# Patient Record
Sex: Male | Born: 1940 | Race: White | Hispanic: No | Marital: Married | Smoking: Never smoker
Health system: Southern US, Community
[De-identification: ages and names within clinical notes are randomized; demographics above are authoritative.]

## PROBLEM LIST (undated history)

## (undated) DIAGNOSIS — G43109 Migraine with aura, not intractable, without status migrainosus: Secondary | ICD-10-CM

## (undated) DIAGNOSIS — M25569 Pain in unspecified knee: Secondary | ICD-10-CM

## (undated) DIAGNOSIS — I2510 Atherosclerotic heart disease of native coronary artery without angina pectoris: Secondary | ICD-10-CM

## (undated) DIAGNOSIS — I10 Essential (primary) hypertension: Secondary | ICD-10-CM

## (undated) DIAGNOSIS — I779 Disorder of arteries and arterioles, unspecified: Secondary | ICD-10-CM

## (undated) DIAGNOSIS — N529 Male erectile dysfunction, unspecified: Secondary | ICD-10-CM

## (undated) DIAGNOSIS — Z8719 Personal history of other diseases of the digestive system: Secondary | ICD-10-CM

## (undated) DIAGNOSIS — R0989 Other specified symptoms and signs involving the circulatory and respiratory systems: Secondary | ICD-10-CM

## (undated) DIAGNOSIS — K219 Gastro-esophageal reflux disease without esophagitis: Secondary | ICD-10-CM

## (undated) DIAGNOSIS — R7301 Impaired fasting glucose: Secondary | ICD-10-CM

## (undated) DIAGNOSIS — M25519 Pain in unspecified shoulder: Secondary | ICD-10-CM

## (undated) DIAGNOSIS — E785 Hyperlipidemia, unspecified: Secondary | ICD-10-CM

## (undated) DIAGNOSIS — H8109 Meniere's disease, unspecified ear: Secondary | ICD-10-CM

## (undated) DIAGNOSIS — Z87442 Personal history of urinary calculi: Secondary | ICD-10-CM

## (undated) DIAGNOSIS — Z951 Presence of aortocoronary bypass graft: Secondary | ICD-10-CM

## (undated) DIAGNOSIS — Z974 Presence of external hearing-aid: Secondary | ICD-10-CM

## (undated) DIAGNOSIS — R9439 Abnormal result of other cardiovascular function study: Secondary | ICD-10-CM

## (undated) DIAGNOSIS — R590 Localized enlarged lymph nodes: Secondary | ICD-10-CM

## (undated) DIAGNOSIS — R0600 Dyspnea, unspecified: Secondary | ICD-10-CM

## (undated) DIAGNOSIS — Z87898 Personal history of other specified conditions: Secondary | ICD-10-CM

## (undated) DIAGNOSIS — G43909 Migraine, unspecified, not intractable, without status migrainosus: Secondary | ICD-10-CM

## (undated) DIAGNOSIS — I739 Peripheral vascular disease, unspecified: Secondary | ICD-10-CM

## (undated) DIAGNOSIS — IMO0002 Reserved for concepts with insufficient information to code with codable children: Secondary | ICD-10-CM

## (undated) DIAGNOSIS — I6529 Occlusion and stenosis of unspecified carotid artery: Secondary | ICD-10-CM

## (undated) HISTORY — DX: Reserved for concepts with insufficient information to code with codable children: IMO0002

## (undated) HISTORY — PX: TONSILLECTOMY: SUR1361

## (undated) HISTORY — DX: Migraine with aura, not intractable, without status migrainosus: G43.109

## (undated) HISTORY — DX: Atherosclerotic heart disease of native coronary artery without angina pectoris: I25.10

## (undated) HISTORY — DX: Migraine, unspecified, not intractable, without status migrainosus: G43.909

## (undated) HISTORY — DX: Pain in unspecified shoulder: M25.519

## (undated) HISTORY — PX: APPENDECTOMY: SHX54

## (undated) HISTORY — DX: Peripheral vascular disease, unspecified: I73.9

## (undated) HISTORY — DX: Male erectile dysfunction, unspecified: N52.9

## (undated) HISTORY — DX: Presence of aortocoronary bypass graft: Z95.1

## (undated) HISTORY — DX: Pain in unspecified knee: M25.569

## (undated) HISTORY — DX: Hyperlipidemia, unspecified: E78.5

## (undated) HISTORY — DX: Disorder of arteries and arterioles, unspecified: I77.9

## (undated) HISTORY — PX: HERNIA REPAIR: SHX51

## (undated) HISTORY — DX: Meniere's disease, unspecified ear: H81.09

## (undated) HISTORY — PX: KNEE CARTILAGE SURGERY: SHX688

## (undated) HISTORY — DX: Impaired fasting glucose: R73.01

## (undated) HISTORY — DX: Essential (primary) hypertension: I10

## (undated) HISTORY — PX: HIATAL HERNIA REPAIR: SHX195

## (undated) HISTORY — DX: Presence of external hearing-aid: Z97.4

## (undated) HISTORY — DX: Gastro-esophageal reflux disease without esophagitis: K21.9

---
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
## 1996-08-20 HISTORY — PX: CARPAL TUNNEL RELEASE: SHX101

## 1998-08-29 ENCOUNTER — Ambulatory Visit (HOSPITAL_COMMUNITY): Admission: RE | Admit: 1998-08-29 | Discharge: 1998-08-29 | Payer: Self-pay

## 1998-10-10 ENCOUNTER — Ambulatory Visit: Admission: RE | Admit: 1998-10-10 | Discharge: 1998-10-10 | Payer: Self-pay | Admitting: Internal Medicine

## 1999-11-03 ENCOUNTER — Emergency Department (HOSPITAL_COMMUNITY): Admission: EM | Admit: 1999-11-03 | Discharge: 1999-11-03 | Payer: Self-pay | Admitting: Emergency Medicine

## 1999-11-04 ENCOUNTER — Encounter: Payer: Self-pay | Admitting: Emergency Medicine

## 2002-01-05 ENCOUNTER — Ambulatory Visit (HOSPITAL_COMMUNITY): Admission: RE | Admit: 2002-01-05 | Discharge: 2002-01-05 | Payer: Self-pay | Admitting: Internal Medicine

## 2004-06-28 ENCOUNTER — Ambulatory Visit: Payer: Self-pay | Admitting: Internal Medicine

## 2004-07-07 ENCOUNTER — Encounter: Admission: RE | Admit: 2004-07-07 | Discharge: 2004-07-07 | Payer: Self-pay | Admitting: Internal Medicine

## 2004-07-25 ENCOUNTER — Ambulatory Visit (HOSPITAL_COMMUNITY): Admission: RE | Admit: 2004-07-25 | Discharge: 2004-07-25 | Payer: Self-pay | Admitting: Internal Medicine

## 2004-08-01 ENCOUNTER — Ambulatory Visit: Payer: Self-pay | Admitting: Internal Medicine

## 2005-03-01 ENCOUNTER — Ambulatory Visit: Payer: Self-pay | Admitting: Internal Medicine

## 2005-03-16 ENCOUNTER — Ambulatory Visit: Payer: Self-pay | Admitting: Internal Medicine

## 2006-08-20 HISTORY — PX: ROTATOR CUFF REPAIR: SHX139

## 2006-08-20 HISTORY — PX: OTHER SURGICAL HISTORY: SHX169

## 2006-08-20 HISTORY — PX: CARPAL TUNNEL RELEASE: SHX101

## 2007-03-13 ENCOUNTER — Ambulatory Visit (HOSPITAL_BASED_OUTPATIENT_CLINIC_OR_DEPARTMENT_OTHER): Admission: RE | Admit: 2007-03-13 | Discharge: 2007-03-13 | Payer: Self-pay | Admitting: Orthopedic Surgery

## 2008-08-20 HISTORY — PX: MEDIAL PARTIAL KNEE REPLACEMENT: SHX5965

## 2009-05-02 ENCOUNTER — Ambulatory Visit: Payer: Self-pay | Admitting: Vascular Surgery

## 2009-05-02 ENCOUNTER — Encounter (INDEPENDENT_AMBULATORY_CARE_PROVIDER_SITE_OTHER): Payer: Self-pay | Admitting: Orthopedic Surgery

## 2009-05-02 ENCOUNTER — Ambulatory Visit: Admission: RE | Admit: 2009-05-02 | Discharge: 2009-05-02 | Payer: Self-pay | Admitting: Orthopedic Surgery

## 2009-07-21 ENCOUNTER — Encounter (INDEPENDENT_AMBULATORY_CARE_PROVIDER_SITE_OTHER): Payer: Self-pay | Admitting: *Deleted

## 2009-07-25 ENCOUNTER — Ambulatory Visit: Payer: Self-pay | Admitting: Internal Medicine

## 2009-07-27 ENCOUNTER — Telehealth: Payer: Self-pay | Admitting: Internal Medicine

## 2009-07-28 ENCOUNTER — Ambulatory Visit: Payer: Self-pay | Admitting: Internal Medicine

## 2009-08-02 ENCOUNTER — Encounter: Payer: Self-pay | Admitting: Internal Medicine

## 2011-01-02 NOTE — Op Note (Signed)
NAME:  Jordan [name], Jordan [name]              ACCOUNT NO.:  [id number]   MEDICAL RECORD NO.:  [id number]          PATIENT TYPE:  AMB   LOCATION:  DSC                          FACILITY:  [facility]   PHYSICIAN:  Katy Fitch. Sypher, M.D. 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   OPERATIONS:  Release of right transverse carpal ligament.   OPERATIONS:  Katy Fitch. Sypher, M.D.   ASSISTANT:  Marveen Reeks. Dasnoit PA-C.   ANESTHESIA:  General by LMA.   SUPERVISING ANESTHESIOLOGIST:  Bedelia Person, M.D.   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   He is status post release of his left transverse carpal ligament  approximately 10 years prior, with a very satisfactory result.  He has  had intermittent numbness in the right hand and returned for evaluation  and management on February 24, 2007.  At that time, he had signs of clinical  carpal tunnel syndrome on the right and had electrodiagnostic studies  completed by Dr. Johna Roles, which revealed significant right carpal  tunnel syndrome.   Due to plans to travel for several weeks, he was treated with a  palliative steroid injection into his right ulnar bursa, planning to  return at this time for release of the right transverse carpal ligament.   Following the injection, he had partial relief of his numbness and  complete relief of his pain.   He now presents for release of his right transverse carpal ligament.   Preoperatively. questions were invited and answered in detail.   PROCEDURE:  Onalee Hua Reichardt is brought to the operating room and placed  in the supine position on the table.   Following an anesthesia consult by Dr. Gypsy Balsam, general  anesthesia by LMA  was selected.   Mr. Gruszka was brought to room 1, placed in the supine position on the  operating table, and under Dr. Burnett Corrente direct supervision, general  anesthesia by LMA technique instituted.   The right arm was prepped with Betadine soap and solution and sterilely  draped.  A pneumatic tourniquet was applied to the proximal right  brachium.   Following exsanguination of the right arm with an Esmarch bandage, the  arterial tourniquet was inflated to 230 mmHg.  The procedure commenced  with a short incision in line of the ring finger in the palm.  Subcutaneous tissues were carefully divided, taking care to identify the  palmar fascia.   The fascia was split in line of its fibers, revealing the superficial  palmar arch and the common sensory branches of the median nerve.   A Penfield 4 elevator was used to gently dissect along the common  digital nerves until the median nerve proper was identified.  The median  nerve was cleared from the deep surface of the transverse carpal  ligament with a Insurance risk surveyor,  followed by use of serrated  scissors to release the ulnar aspect of the transverse carpal ligament  into the distal forearm.   This widely opened the carpal canal.   A Sewell parotid retractor was used to visualize the median nerve well  into the distal forearm.  The ulnar bursa was noted be thickened and  fibrotic, but otherwise no anatomic abnormalities were noted.   Bleeding points along margin of the released ligament were  electrocauterized with bipolar current, followed by repair of the skin  with intradermal 3-0 Prolene.   A compressive dressing was applied, with a volar plaster splint  maintaining the wrist in 5 degrees of dorsiflexion.   There were no apparent complications.   Mr. Culbreath tolerated the surgery and anesthesia well.   He is awakened from his anesthesia and transferred to the recovery room  with stable signs.   He  will be discharged to the care of his family, with a prescription for  Vicodin 5 mg 1 p.o. q.4-6 h. p.r.n. pain.   I will meet him in 9 days and remove his suture.      Katy Fitch Sypher, M.D.  Electronically Signed     RVS/MEDQ  D:  03/13/2007  T:  03/14/2007  Job:  [id number]   cc:   Loraine Leriche A. Perini, M.D.

## 2011-06-04 LAB — POCT HEMOGLOBIN-HEMACUE: Hemoglobin: 16

## 2011-07-03 ENCOUNTER — Other Ambulatory Visit: Payer: Self-pay

## 2011-07-03 DIAGNOSIS — I6529 Occlusion and stenosis of unspecified carotid artery: Secondary | ICD-10-CM

## 2011-07-05 ENCOUNTER — Encounter: Payer: Self-pay | Admitting: Vascular Surgery

## 2011-07-26 ENCOUNTER — Encounter: Payer: Self-pay | Admitting: Vascular Surgery

## 2011-07-27 ENCOUNTER — Ambulatory Visit (INDEPENDENT_AMBULATORY_CARE_PROVIDER_SITE_OTHER): Payer: Medicare Other | Admitting: Vascular Surgery

## 2011-07-27 ENCOUNTER — Encounter: Payer: Self-pay | Admitting: Vascular Surgery

## 2011-07-27 VITALS — BP 146/80 | HR 58 | Resp 16 | Ht 67.0 in | Wt 179.0 lb

## 2011-07-27 DIAGNOSIS — I6529 Occlusion and stenosis of unspecified carotid artery: Secondary | ICD-10-CM

## 2011-07-27 HISTORY — DX: Occlusion and stenosis of unspecified carotid artery: I65.29

## 2011-07-27 NOTE — Assessment & Plan Note (Signed)
Minimal disease, continued surveillance not needed at this point

## 2011-07-27 NOTE — Progress Notes (Signed)
[facility]  New Carotid Patient  Referred by:  Ezequiel Kayser, MD [id number] [facility] [place], [place] [address]  Reason for referral: possible carotid stenosis  History of Present Illness  Jordan [name] is a 70 y.o. male who presents with chief complaint: possible carotid problem.  Patient was involved with a fall and had a cervical x-ray completed which demonstrated: calcified carotids.  Patient has no history of TIA or stroke symptom.  The patient has never had amaurosis fugax or monocular blindness.  The patient has never had facial drooping or hemiplegia.  The patient has never had receptive or expressive aphasia.   The patient's risks factors for carotid disease include: hyperlipidemia.  Past Medical History  Diagnosis Date  . DDD (degenerative disc disease)   . Carotid artery occlusion   . ED (erectile dysfunction)   . Hyperlipidemia   . Knee pain     bilateral  . GERD (gastroesophageal reflux disease)   . Impaired fasting glucose   . Shoulder pain   . Wears hearing aid   . Ocular migraine   . Meniere's vertigo     Past Surgical History  Procedure Date  . Carpal tunnel release 1998  . Inguinal hernia repair   . Knee cartilage surgery   . Shoulder surgery 2008    left  . Wrist surgery 2008    right  . Joint replacement 2010    Partial right knee replacement    History   Social History  . Marital Status: Unknown    Spouse Name: N/A    Number of Children: N/A  . Years of Education: N/A   Occupational History  . Not on file.   Social History Main Topics  . Smoking status: Never Smoker   . Smokeless tobacco: Not on file  . Alcohol Use: 9.6 oz/week    14 Glasses of wine, 2 Shots of liquor per week  . Drug Use: No  . Sexually Active:    Other Topics Concern  . Not on file   Social History Narrative  . No narrative on file    Family History  Problem Relation Age of Onset  . Heart failure Mother    . Cancer Mother   . Heart disease Mother   . Coronary artery disease Father   . Heart attack Father   . Heart disease Father   . Cancer Father   . Hyperlipidemia Father   . Hyperlipidemia Brother   . Hypertension Brother   . Hyperlipidemia Son     Current Outpatient Prescriptions on File Prior to Visit  Medication Sig Dispense Refill  . cholecalciferol (VITAMIN D) 1000 UNITS tablet Take 4,000 Units by mouth daily.       Marland Kitchen esomeprazole (NEXIUM) 40 MG capsule Take 40 mg by mouth daily before breakfast.        . naproxen sodium (ANAPROX) 220 MG tablet Take 220 mg by mouth 2 (two) times daily with a meal.        . Omega-3 Fatty Acids (FISH OIL) 1000 MG CAPS Take 4 capsules by mouth daily.        . rosuvastatin (CRESTOR) 10 MG tablet Take 5 mg by mouth daily.       . tadalafil (CIALIS) 20 MG tablet Take 10 mg by mouth daily as needed.        . meclizine (ANTIVERT) 25 MG tablet Take 25 mg by mouth daily.        Marland Kitchen  pyridOXINE (VITAMIN B-6) 100 MG tablet Take 100 mg by mouth daily.          No Known Allergies  REVIEW OF SYSTEMS:  (Positives checked otherwise negative)  CARDIOVASCULAR: [ ]  chest pain    [ ]  chest pressure    [ ]  palpitations    [ ]  orthopnea   [ ]  dyspnea on exert. [ ]  claudication    [ ]  rest pain     [ ]  DVT     [ ]  phlebitis  PULMONARY:    [ ]  productive cough [ ]  asthma  [ ]  wheezing  NEUROLOGIC:    [ ]  weakness    [ ]  paresthesias   [ ]  aphasia    [ ]  amaurosis    [ ]  dizziness  HEMATOLOGIC:    [ ]  bleeding problems  [ ]  clotting disorders  MUSCULOSKEL: [x]  joint pain     [ ]  joint swelling  GASTROINTEST:  [ ]   blood in stool   [ ]   hematemesis  GENITOURINARY:   [ ]   dysuria    [ ]   hematuria  PSYCHIATRIC:   [ ]  history of major depression  INTEGUMENTARY: [ ]  rashes    [ ]  ulcers  CONSTITUTIONAL:  [ ]  fever     [ ]  chills  Physical Examination  Filed Vitals:   07/27/11 1250  BP: 146/80  Pulse: 58  Resp: 16  Height: 5\' 7"  (1.702 m)  Weight: 179 lb  (81.194 kg)  SpO2: 95%   Body mass index is 28.04 kg/(m^2).  General: A&O x 3, WDWN   Head: /AT  Ear/Nose/Throat: Hearing grossly intact, nares w/o erythema or drainage, oropharynx w/o Erythema/Exudate  Eyes: PERRLA, EOMI  Neck: Supple, no nuchal rigidity, no palpable LAD  Pulmonary: Sym exp, good air movt, CTAB, no rales, rhonchi, & wheezing  Cardiac: RRR, Nl S1, S2, no Murmurs, rubs or gallops  Vascular: Vessel Right Left  Radial Palpable Palpable  Brachial Palpable Palpable  Carotid Palpable, without bruit Palpable, without bruit  Aorta Non-palpable N/A  Femoral Palpable Palpable  Popliteal Non-palpable Non-palpable  PT Palpable Palpable  DP Palpable Palpable   Gastrointestinal: soft, NTND, -G/Jordan, - HSM, - masses, - CVAT B  Musculoskeletal: M/S 5/5 throughout , Extremities without ischemic changes   Neurologic: CN 2-12 intact , Pain and light touch intact in extremities , Motor exam as listed above  Psychiatric: Judgment intact, Mood & affect appropriate for pt's clinical situation  Dermatologic: See M/S exam for extremity exam, no rashes otherwise noted  Lymph : No Cervical, Axillary, or Inguinal lymphadenopathy   Non-Invasive Vascular Imaging  CAROTID DUPLEX (Date: 07/27/11):   Jordan ICA stenosis: 1-39%  Jordan VA:  patent and antegrade  L ICA stenosis: 1-39%  L VA:  patent and antegrade  Outside Studies/Documentation 5 pages of outside documents were reviewed including: outside clinic workup included cervical x-ray report.  Medical Decision Making  Jordan [name] is a 70 y.o. male who presents with: minimal B ICA stenosis.   Based on the patient's vascular studies and examination, I have offered the patient: as needed carotid duplex.  Given this patient's minimal disease, I don't think there is much advantage to continued surveillance.  If he develops a bruit or become sx, then at that time, I would rescan him.  Alternatively we could repeat the duplex in  another two years.  The patient elected to forgo continued surveillance at this point.  I discussed in depth  with the patient the nature of atherosclerosis, and emphasized the importance of maximal medical management including strict control of blood pressure, blood glucose, and lipid levels, obtaining regular exercise, antiplatelet agents, and cessation of smoking.  The patient is aware that without maximal medical management the underlying atherosclerotic disease process will progress, limiting the benefit of any interventions.  Thank you for allowing [place] to participate in this patient's care.  Leonides Sake, MD [facility] Office: [phone] Pager: [phone]  07/27/2011, 1:36 PM

## 2011-07-27 NOTE — Progress Notes (Signed)
Carotid duplex bilateral done @ [facility] 07/27/2011

## 2011-08-02 NOTE — Procedures (Unsigned)
CAROTID DUPLEX EXAM  INDICATION:  Carotid stenosis.  HISTORY: Diabetes:  No. Cardiac:  No. Hypertension:  No. Smoking:  No. Previous Surgery:  No. CV History:  Asymptomatic. Amaurosis Fugax No, Paresthesias No, Hemiparesis No.                                      RIGHT             LEFT Brachial systolic pressure:         132               126 Brachial Doppler waveforms:         WNL               WNL Vertebral direction of flow:        Antegrade         Antegrade DUPLEX VELOCITIES (cm/sec) CCA peak systolic                   113               117 ECA peak systolic                   92                77 ICA peak systolic                   91                71 ICA end diastolic                   29                21 PLAQUE MORPHOLOGY:                  Heterogenous      Heterogenous PLAQUE AMOUNT:                      Mild              Mild PLAQUE LOCATION:                    CCA/ICA           CCA/ICA  IMPRESSION: 1. Bilateral common carotid artery disease present. 2. Bilateral internal carotid artery stenosis estimated in the 1% to     39% range. 3. Bilateral external carotid arteries are patent. 4. Bilateral vertebral arteries are patent and antegrade.  ___________________________________________ Fransisco Hertz, MD  SH/MEDQ  D:  07/27/2011  T:  07/27/2011  Job:  [id number]

## 2011-10-18 DIAGNOSIS — H04129 Dry eye syndrome of unspecified lacrimal gland: Secondary | ICD-10-CM | POA: Diagnosis not present

## 2011-10-18 DIAGNOSIS — H2510 Age-related nuclear cataract, unspecified eye: Secondary | ICD-10-CM | POA: Diagnosis not present

## 2011-11-26 DIAGNOSIS — D239 Other benign neoplasm of skin, unspecified: Secondary | ICD-10-CM | POA: Diagnosis not present

## 2011-11-26 DIAGNOSIS — L570 Actinic keratosis: Secondary | ICD-10-CM | POA: Diagnosis not present

## 2011-11-26 DIAGNOSIS — L821 Other seborrheic keratosis: Secondary | ICD-10-CM | POA: Diagnosis not present

## 2012-02-07 DIAGNOSIS — M549 Dorsalgia, unspecified: Secondary | ICD-10-CM | POA: Diagnosis not present

## 2012-02-07 DIAGNOSIS — M999 Biomechanical lesion, unspecified: Secondary | ICD-10-CM | POA: Diagnosis not present

## 2012-02-07 DIAGNOSIS — M5380 Other specified dorsopathies, site unspecified: Secondary | ICD-10-CM | POA: Diagnosis not present

## 2012-02-26 DIAGNOSIS — M999 Biomechanical lesion, unspecified: Secondary | ICD-10-CM | POA: Diagnosis not present

## 2012-02-26 DIAGNOSIS — M5380 Other specified dorsopathies, site unspecified: Secondary | ICD-10-CM | POA: Diagnosis not present

## 2012-02-26 DIAGNOSIS — M549 Dorsalgia, unspecified: Secondary | ICD-10-CM | POA: Diagnosis not present

## 2012-03-10 DIAGNOSIS — M999 Biomechanical lesion, unspecified: Secondary | ICD-10-CM | POA: Diagnosis not present

## 2012-03-10 DIAGNOSIS — M5380 Other specified dorsopathies, site unspecified: Secondary | ICD-10-CM | POA: Diagnosis not present

## 2012-03-10 DIAGNOSIS — M549 Dorsalgia, unspecified: Secondary | ICD-10-CM | POA: Diagnosis not present

## 2012-03-19 DIAGNOSIS — M999 Biomechanical lesion, unspecified: Secondary | ICD-10-CM | POA: Diagnosis not present

## 2012-03-19 DIAGNOSIS — M549 Dorsalgia, unspecified: Secondary | ICD-10-CM | POA: Diagnosis not present

## 2012-03-19 DIAGNOSIS — M5380 Other specified dorsopathies, site unspecified: Secondary | ICD-10-CM | POA: Diagnosis not present

## 2012-03-27 DIAGNOSIS — M999 Biomechanical lesion, unspecified: Secondary | ICD-10-CM | POA: Diagnosis not present

## 2012-03-27 DIAGNOSIS — M5380 Other specified dorsopathies, site unspecified: Secondary | ICD-10-CM | POA: Diagnosis not present

## 2012-03-27 DIAGNOSIS — M549 Dorsalgia, unspecified: Secondary | ICD-10-CM | POA: Diagnosis not present

## 2012-04-09 DIAGNOSIS — M999 Biomechanical lesion, unspecified: Secondary | ICD-10-CM | POA: Diagnosis not present

## 2012-04-09 DIAGNOSIS — M549 Dorsalgia, unspecified: Secondary | ICD-10-CM | POA: Diagnosis not present

## 2012-04-09 DIAGNOSIS — M5380 Other specified dorsopathies, site unspecified: Secondary | ICD-10-CM | POA: Diagnosis not present

## 2012-04-15 DIAGNOSIS — M999 Biomechanical lesion, unspecified: Secondary | ICD-10-CM | POA: Diagnosis not present

## 2012-04-15 DIAGNOSIS — M5380 Other specified dorsopathies, site unspecified: Secondary | ICD-10-CM | POA: Diagnosis not present

## 2012-04-15 DIAGNOSIS — M549 Dorsalgia, unspecified: Secondary | ICD-10-CM | POA: Diagnosis not present

## 2012-05-06 DIAGNOSIS — Z23 Encounter for immunization: Secondary | ICD-10-CM | POA: Diagnosis not present

## 2012-06-05 DIAGNOSIS — L570 Actinic keratosis: Secondary | ICD-10-CM | POA: Diagnosis not present

## 2012-06-05 DIAGNOSIS — L821 Other seborrheic keratosis: Secondary | ICD-10-CM | POA: Diagnosis not present

## 2012-06-05 DIAGNOSIS — D239 Other benign neoplasm of skin, unspecified: Secondary | ICD-10-CM | POA: Diagnosis not present

## 2012-09-30 DIAGNOSIS — R7301 Impaired fasting glucose: Secondary | ICD-10-CM | POA: Diagnosis not present

## 2012-09-30 DIAGNOSIS — I2510 Atherosclerotic heart disease of native coronary artery without angina pectoris: Secondary | ICD-10-CM | POA: Diagnosis not present

## 2012-09-30 DIAGNOSIS — Z125 Encounter for screening for malignant neoplasm of prostate: Secondary | ICD-10-CM | POA: Diagnosis not present

## 2012-09-30 DIAGNOSIS — E785 Hyperlipidemia, unspecified: Secondary | ICD-10-CM | POA: Diagnosis not present

## 2012-10-07 DIAGNOSIS — E785 Hyperlipidemia, unspecified: Secondary | ICD-10-CM | POA: Diagnosis not present

## 2012-10-07 DIAGNOSIS — R7301 Impaired fasting glucose: Secondary | ICD-10-CM | POA: Diagnosis not present

## 2012-10-07 DIAGNOSIS — Z125 Encounter for screening for malignant neoplasm of prostate: Secondary | ICD-10-CM | POA: Diagnosis not present

## 2012-10-07 DIAGNOSIS — Z0000 Encounter for general adult medical examination without abnormal findings: Secondary | ICD-10-CM | POA: Diagnosis not present

## 2012-10-07 DIAGNOSIS — I6529 Occlusion and stenosis of unspecified carotid artery: Secondary | ICD-10-CM | POA: Diagnosis not present

## 2012-10-13 ENCOUNTER — Other Ambulatory Visit (INDEPENDENT_AMBULATORY_CARE_PROVIDER_SITE_OTHER): Payer: Medicare Other | Admitting: *Deleted

## 2012-10-13 DIAGNOSIS — I6529 Occlusion and stenosis of unspecified carotid artery: Secondary | ICD-10-CM

## 2012-10-16 ENCOUNTER — Encounter: Payer: Self-pay | Admitting: Surgery

## 2012-10-17 DIAGNOSIS — Z1212 Encounter for screening for malignant neoplasm of rectum: Secondary | ICD-10-CM | POA: Diagnosis not present

## 2013-01-28 DIAGNOSIS — M545 Low back pain, unspecified: Secondary | ICD-10-CM | POA: Diagnosis not present

## 2013-01-28 DIAGNOSIS — M546 Pain in thoracic spine: Secondary | ICD-10-CM | POA: Diagnosis not present

## 2013-01-28 DIAGNOSIS — M5380 Other specified dorsopathies, site unspecified: Secondary | ICD-10-CM | POA: Diagnosis not present

## 2013-01-28 DIAGNOSIS — M999 Biomechanical lesion, unspecified: Secondary | ICD-10-CM | POA: Diagnosis not present

## 2013-01-30 DIAGNOSIS — M999 Biomechanical lesion, unspecified: Secondary | ICD-10-CM | POA: Diagnosis not present

## 2013-01-30 DIAGNOSIS — M545 Low back pain, unspecified: Secondary | ICD-10-CM | POA: Diagnosis not present

## 2013-01-30 DIAGNOSIS — M546 Pain in thoracic spine: Secondary | ICD-10-CM | POA: Diagnosis not present

## 2013-01-30 DIAGNOSIS — M5380 Other specified dorsopathies, site unspecified: Secondary | ICD-10-CM | POA: Diagnosis not present

## 2013-02-10 DIAGNOSIS — R42 Dizziness and giddiness: Secondary | ICD-10-CM | POA: Diagnosis not present

## 2013-02-10 DIAGNOSIS — H903 Sensorineural hearing loss, bilateral: Secondary | ICD-10-CM | POA: Diagnosis not present

## 2013-03-05 DIAGNOSIS — M546 Pain in thoracic spine: Secondary | ICD-10-CM | POA: Diagnosis not present

## 2013-03-05 DIAGNOSIS — M999 Biomechanical lesion, unspecified: Secondary | ICD-10-CM | POA: Diagnosis not present

## 2013-03-05 DIAGNOSIS — M5380 Other specified dorsopathies, site unspecified: Secondary | ICD-10-CM | POA: Diagnosis not present

## 2013-03-05 DIAGNOSIS — M545 Low back pain, unspecified: Secondary | ICD-10-CM | POA: Diagnosis not present

## 2013-04-06 DIAGNOSIS — M9981 Other biomechanical lesions of cervical region: Secondary | ICD-10-CM | POA: Diagnosis not present

## 2013-04-06 DIAGNOSIS — M542 Cervicalgia: Secondary | ICD-10-CM | POA: Diagnosis not present

## 2013-04-16 DIAGNOSIS — M9981 Other biomechanical lesions of cervical region: Secondary | ICD-10-CM | POA: Diagnosis not present

## 2013-04-16 DIAGNOSIS — M542 Cervicalgia: Secondary | ICD-10-CM | POA: Diagnosis not present

## 2013-05-19 ENCOUNTER — Encounter: Payer: Self-pay | Admitting: Internal Medicine

## 2013-05-25 ENCOUNTER — Ambulatory Visit (INDEPENDENT_AMBULATORY_CARE_PROVIDER_SITE_OTHER): Payer: Medicare Other | Admitting: Internal Medicine

## 2013-05-25 ENCOUNTER — Encounter: Payer: Self-pay | Admitting: Internal Medicine

## 2013-05-25 VITALS — BP 128/64 | HR 77 | Ht 67.0 in | Wt 184.2 lb

## 2013-05-25 DIAGNOSIS — Z860100 Personal history of colon polyps, unspecified: Secondary | ICD-10-CM

## 2013-05-25 DIAGNOSIS — K625 Hemorrhage of anus and rectum: Secondary | ICD-10-CM | POA: Diagnosis not present

## 2013-05-25 MED ORDER — MOVIPREP 100 G PO SOLR: 1.0000 | Freq: Once | ORAL | Status: DC

## 2013-05-25 NOTE — Progress Notes (Signed)
HISTORY OF PRESENT ILLNESS:  Jordan [name] is a 72 y.o. male with the below listed medical history who presents today for evaluation of rectal bleeding. Patient has a history of adenomatous colon polyps (index exam 8 2004 with large tubulovillous adenoma. Followup examinations 2006 and most recently 2010, with adenomatous polyps). Patient reports the presence of minor then moderate amounts of red blood mixed with his stool approximately 2 weeks ago. This lasted for approximately one day then resolved. He does use NSAIDs occasionally. He denies any change in bowel habits or any associated rectal or abdominal discomfort. In addition to polyps, his last examination revealed mild diverticulosis of the sigmoid colon and internal hemorrhoids. He does take PPI for GERD. GI review of systems is otherwise negative.  REVIEW OF SYSTEMS:  All non-GI ROS negative except for arthritis, back pain, visual change, hearing impairment  Past Medical History  Diagnosis Date  . DDD (degenerative disc disease)   . Carotid artery occlusion   . ED (erectile dysfunction)   . Hyperlipidemia   . Knee pain     bilateral  . GERD (gastroesophageal reflux disease)   . Impaired fasting glucose   . Shoulder pain   . Wears hearing aid   . Ocular migraine   . Meniere's vertigo   . HTN (hypertension)   . Kidney stones     Past Surgical History  Procedure Laterality Date  . Carpal tunnel release Left 1998  . Hiatal hernia repair    . Knee cartilage surgery Bilateral   . Rotator cuff repair  2008  . Carpal tunnel release Right 2008  . Medial partial knee replacement Right 2010    Partial right knee replacement  . Tonsillectomy      Social History Jordan [name]  reports that he has never smoked. He has never used smokeless tobacco. He reports that he drinks about 9.6 ounces of alcohol per week. He reports that he does not use illicit drugs.  family history includes Cancer in his mother; Coronary artery disease in his  father; Heart attack in his father; Heart disease in his father; Heart failure in his mother; Hyperlipidemia in his brother, father, and son; Hypertension in his brother.  No Known Allergies     PHYSICAL EXAMINATION: Vital signs: BP 128/64  Pulse 77  Ht 5\' 7"  (1.702 m)  Wt 184 lb 4 oz (83.575 kg)  BMI 28.85 kg/m2  Constitutional: generally well-appearing, no acute distress Psychiatric: alert and oriented x3, cooperative Eyes: extraocular movements intact, anicteric, conjunctiva pink Mouth: oral pharynx moist, no lesions Neck: supple no lymphadenopathy Cardiovascular: heart regular rate and rhythm, no murmur Lungs: clear to auscultation bilaterally Abdomen: soft, nontender, nondistended, no obvious ascites, no peritoneal signs, normal bowel sounds, no organomegaly Rectal: Deferred until colonoscopy Extremities: no lower extremity edema bilaterally Skin: no lesions on visible extremities Neuro: No focal deficits. Hearing aids  ASSESSMENT:  #1. Rectal bleeding as described. Rule out internal hemorrhoids. Rule out neoplasia #2. History of advanced neoplasia on previous colonoscopy. Last colonoscopy 2010 #3. GERD. Asymptomatic on PPI #4. General medical problems. Stable    PLAN:  #1. Schedule colonoscopy to evaluate bleeding and provide polyp surveillance.The nature of the procedure, as well as the risks, benefits, and alternatives were carefully and thoroughly reviewed with the patient. Ample time for discussion and questions allowed. The patient understood, was satisfied, and agreed to proceed. Movi prep prescribed. The patient instructed on its use. #2. Continue reflux precautions and PPI

## 2013-05-25 NOTE — Patient Instructions (Addendum)

## 2013-05-29 ENCOUNTER — Telehealth: Payer: Self-pay | Admitting: *Deleted

## 2013-05-29 NOTE — Telephone Encounter (Signed)
Called and LM for the patient to call me regarding his scheduled colonoscopy.

## 2013-05-29 NOTE — Telephone Encounter (Signed)
The patient did call back. I offered him 06-23-2013 at 8:30 am. He said that would be okay.  He said if there were any cancellations prior to 11-4  then call him but 11-4 is fine.  I offered to mail out new instructions.

## 2013-06-04 DIAGNOSIS — Z85828 Personal history of other malignant neoplasm of skin: Secondary | ICD-10-CM | POA: Diagnosis not present

## 2013-06-04 DIAGNOSIS — L570 Actinic keratosis: Secondary | ICD-10-CM | POA: Diagnosis not present

## 2013-06-04 DIAGNOSIS — L821 Other seborrheic keratosis: Secondary | ICD-10-CM | POA: Diagnosis not present

## 2013-06-04 DIAGNOSIS — L819 Disorder of pigmentation, unspecified: Secondary | ICD-10-CM | POA: Diagnosis not present

## 2013-06-04 DIAGNOSIS — L820 Inflamed seborrheic keratosis: Secondary | ICD-10-CM | POA: Diagnosis not present

## 2013-06-04 DIAGNOSIS — D692 Other nonthrombocytopenic purpura: Secondary | ICD-10-CM | POA: Diagnosis not present

## 2013-06-04 DIAGNOSIS — D1801 Hemangioma of skin and subcutaneous tissue: Secondary | ICD-10-CM | POA: Diagnosis not present

## 2013-06-20 HISTORY — PX: COLONOSCOPY: SHX174

## 2013-06-23 ENCOUNTER — Encounter: Payer: Self-pay | Admitting: Internal Medicine

## 2013-06-23 ENCOUNTER — Ambulatory Visit (AMBULATORY_SURGERY_CENTER): Payer: Medicare Other | Admitting: Internal Medicine

## 2013-06-23 VITALS — BP 124/83 | HR 52 | Temp 96.7°F | Resp 14 | Ht 67.0 in | Wt 184.0 lb

## 2013-06-23 DIAGNOSIS — I10 Essential (primary) hypertension: Secondary | ICD-10-CM | POA: Diagnosis not present

## 2013-06-23 DIAGNOSIS — K625 Hemorrhage of anus and rectum: Secondary | ICD-10-CM | POA: Diagnosis not present

## 2013-06-23 DIAGNOSIS — Z860100 Personal history of colon polyps, unspecified: Secondary | ICD-10-CM

## 2013-06-23 DIAGNOSIS — E7800 Pure hypercholesterolemia, unspecified: Secondary | ICD-10-CM | POA: Diagnosis not present

## 2013-06-23 MED ORDER — SODIUM CHLORIDE 0.9 % IV SOLN: 500.0000 mL | INTRAVENOUS | Status: DC

## 2013-06-23 NOTE — Progress Notes (Signed)
Patient did not have preoperative order for IV antibiotic SSI prophylaxis. (G8918)  Patient did not experience any of the following events: a burn prior to discharge; a fall within the facility; wrong site/side/patient/procedure/implant event; or a hospital transfer or hospital admission upon discharge from the facility. (G8907)  

## 2013-06-23 NOTE — Patient Instructions (Signed)
YOU HAD AN ENDOSCOPIC PROCEDURE TODAY AT THE [facility]: Refer to the procedure report that was given to you for any specific questions about what was found during the examination.  If the procedure report does not answer your questions, please call your gastroenterologist to clarify.  If you requested that your care partner not be given the details of your procedure findings, then the procedure report has been included in a sealed envelope for you to review at your convenience later.  YOU SHOULD EXPECT: Some feelings of bloating in the abdomen. Passage of more gas than usual.  Walking can help get rid of the air that was put into your GI tract during the procedure and reduce the bloating. If you had a lower endoscopy (such as a colonoscopy or flexible sigmoidoscopy) you may notice spotting of blood in your stool or on the toilet paper. If you underwent a bowel prep for your procedure, then you may not have a normal bowel movement for a few days.  DIET: Your first meal following the procedure should be a light meal and then it is ok to progress to your normal diet.  A half-sandwich or bowl of soup is an example of a good first meal.  Heavy or fried foods are harder to digest and may make you feel nauseous or bloated.  Likewise meals heavy in dairy and vegetables can cause extra gas to form and this can also increase the bloating.  Drink plenty of fluids but you should avoid alcoholic beverages for 24 hours.  ACTIVITY: Your care partner should take you home directly after the procedure.  You should plan to take it easy, moving slowly for the rest of the day.  You can resume normal activity the day after the procedure however you should NOT DRIVE or use heavy machinery for 24 hours (because of the sedation medicines used during the test).    SYMPTOMS TO REPORT IMMEDIATELY: A gastroenterologist can be reached at any hour.  During normal business hours, 8:30 AM to 5:00 PM Monday through Friday,  call [phone].  After hours and on weekends, please call the GI answering service at [phone] who will take a message and have the physician on call contact you.   Following lower endoscopy (colonoscopy or flexible sigmoidoscopy):  Excessive amounts of blood in the stool  Significant tenderness or worsening of abdominal pains  Swelling of the abdomen that is new, acute  Fever of 100F or higher  FOLLOW UP: If any biopsies were taken you will be contacted by phone or by letter within the next 1-3 weeks.  Call your gastroenterologist if you have not heard about the biopsies in 3 weeks.  Our staff will call the home number listed on your records the next business day following your procedure to check on you and address any questions or concerns that you may have at that time regarding the information given to you following your procedure. This is a courtesy call and so if there is no answer at the home number and we have not heard from you through the emergency physician on call, we will assume that you have returned to your regular daily activities without incident.  SIGNATURES/CONFIDENTIALITY: You and/or your care partner have signed paperwork which will be entered into your electronic medical record.  These signatures attest to the fact that that the information above on your After Visit Summary has been reviewed and is understood.  Full responsibility of the confidentiality of this   discharge information lies with you and/or your care-partner.  Recommendations See procedure report  

## 2013-06-23 NOTE — Op Note (Signed)
[facility] [address] [place], [address]   COLONOSCOPY PROCEDURE REPORT  PATIENT: Jordan, [name]  MR#: [id number] BIRTHDATE: April 25, 1941 , 72  yrs. old GENDER: Male ENDOSCOPIST: Roxy Cedar, MD REFERRED BY:.  Self / Office PROCEDURE DATE:  06/23/2013 PROCEDURE:   Colonoscopy, surveillance First Screening Colonoscopy - Avg.  risk and is 50 yrs.  old or older - No.  Prior Negative Screening - Now for repeat screening. N/A  History of Adenoma - Now for follow-up colonoscopy & has been > or = to 3 yrs.  Yes hx of adenoma.  Has been 3 or more years since last colonoscopy.  Polyps Removed Today? No.  Recommend repeat exam, <10 yrs? Yes.  High risk (family or personal hx). ASA CLASS:   Class II INDICATIONS:Patient's personal history of adenomatous colon polyps (Index 2004 - TVA; F/U 2006 and 2010 w/ TAs) and rectal bleeding (self limited a fews ago). MEDICATIONS: MAC sedation, administered by CRNA and propofol (Diprivan) 200mg  IV  DESCRIPTION OF PROCEDURE:   After the risks benefits and alternatives of the procedure were thoroughly explained, informed consent was obtained.  A digital rectal exam revealed no abnormalities of the rectum.   The LB [id number]  endoscope was introduced through the anus and advanced to the cecum, which was identified by both the appendix and ileocecal valve. No adverse events experienced.   The quality of the prep was good, using MoviPrep  The instrument was then slowly withdrawn as the colon was fully examined.      COLON FINDINGS: Moderate diverticulosis was noted The finding was in the left colon.   The colon was otherwise normal.  There was no inflammation, polyps or cancers .  Retroflexed views revealed internal hemorrhoids. The time to cecum=1 minutes 31 seconds. Withdrawal time=11 minutes 34 seconds.  The scope was withdrawn and the procedure completed.  COMPLICATIONS: There were no complications.  ENDOSCOPIC  IMPRESSION: 1.   Moderate diverticulosis was noted in the left colon 2.   The colon was otherwise normal  RECOMMENDATIONS: 1. Follow up colonoscopy in 5 years (hx advanced adenoma)   eSigned:  Roxy Cedar, MD 06/23/2013 9:31 AM   cc: Rodrigo Ran, MD and The Patient

## 2013-06-23 NOTE — Progress Notes (Signed)
Report to pacu rn, vss, bbs=clear 

## 2013-06-24 ENCOUNTER — Telehealth: Payer: Self-pay | Admitting: *Deleted

## 2013-06-24 NOTE — Telephone Encounter (Signed)
  Follow up Call-  Call back number 06/23/2013  Post procedure Call Back phone  # [phone]  Permission to leave phone message Yes     Patient questions:  Do you have a fever, pain , or abdominal swelling? no Pain Score  0 *  Have you tolerated food without any problems? yes  Have you been able to return to your normal activities? yes  Do you have any questions about your discharge instructions: Diet   no Medications  no Follow up visit  no  Do you have questions or concerns about your Care? no  Actions: * If pain score is 4 or above: No action needed, pain <4.

## 2013-07-01 DIAGNOSIS — Z23 Encounter for immunization: Secondary | ICD-10-CM | POA: Diagnosis not present

## 2013-10-12 DIAGNOSIS — R7301 Impaired fasting glucose: Secondary | ICD-10-CM | POA: Diagnosis not present

## 2013-10-12 DIAGNOSIS — Z125 Encounter for screening for malignant neoplasm of prostate: Secondary | ICD-10-CM | POA: Diagnosis not present

## 2013-10-12 DIAGNOSIS — E785 Hyperlipidemia, unspecified: Secondary | ICD-10-CM | POA: Diagnosis not present

## 2013-10-19 DIAGNOSIS — E785 Hyperlipidemia, unspecified: Secondary | ICD-10-CM | POA: Diagnosis not present

## 2013-10-19 DIAGNOSIS — M25569 Pain in unspecified knee: Secondary | ICD-10-CM | POA: Diagnosis not present

## 2013-10-19 DIAGNOSIS — Z6827 Body mass index (BMI) 27.0-27.9, adult: Secondary | ICD-10-CM | POA: Diagnosis not present

## 2013-10-19 DIAGNOSIS — N529 Male erectile dysfunction, unspecified: Secondary | ICD-10-CM | POA: Diagnosis not present

## 2013-10-19 DIAGNOSIS — K219 Gastro-esophageal reflux disease without esophagitis: Secondary | ICD-10-CM | POA: Diagnosis not present

## 2013-10-19 DIAGNOSIS — R7301 Impaired fasting glucose: Secondary | ICD-10-CM | POA: Diagnosis not present

## 2013-10-19 DIAGNOSIS — Z0000 Encounter for general adult medical examination without abnormal findings: Secondary | ICD-10-CM | POA: Diagnosis not present

## 2013-10-19 DIAGNOSIS — Z23 Encounter for immunization: Secondary | ICD-10-CM | POA: Diagnosis not present

## 2013-10-19 DIAGNOSIS — Z79899 Other long term (current) drug therapy: Secondary | ICD-10-CM | POA: Diagnosis not present

## 2013-10-19 DIAGNOSIS — Z1331 Encounter for screening for depression: Secondary | ICD-10-CM | POA: Diagnosis not present

## 2013-10-19 DIAGNOSIS — I6529 Occlusion and stenosis of unspecified carotid artery: Secondary | ICD-10-CM | POA: Diagnosis not present

## 2013-10-20 DIAGNOSIS — H43819 Vitreous degeneration, unspecified eye: Secondary | ICD-10-CM | POA: Diagnosis not present

## 2013-10-20 DIAGNOSIS — H2510 Age-related nuclear cataract, unspecified eye: Secondary | ICD-10-CM | POA: Diagnosis not present

## 2013-10-20 DIAGNOSIS — H04129 Dry eye syndrome of unspecified lacrimal gland: Secondary | ICD-10-CM | POA: Diagnosis not present

## 2014-02-01 DIAGNOSIS — M25559 Pain in unspecified hip: Secondary | ICD-10-CM | POA: Diagnosis not present

## 2014-02-01 DIAGNOSIS — M545 Low back pain, unspecified: Secondary | ICD-10-CM | POA: Diagnosis not present

## 2014-02-01 DIAGNOSIS — M9981 Other biomechanical lesions of cervical region: Secondary | ICD-10-CM | POA: Diagnosis not present

## 2014-02-01 DIAGNOSIS — M999 Biomechanical lesion, unspecified: Secondary | ICD-10-CM | POA: Diagnosis not present

## 2014-02-01 DIAGNOSIS — M542 Cervicalgia: Secondary | ICD-10-CM | POA: Diagnosis not present

## 2014-02-09 DIAGNOSIS — M999 Biomechanical lesion, unspecified: Secondary | ICD-10-CM | POA: Diagnosis not present

## 2014-02-09 DIAGNOSIS — M545 Low back pain, unspecified: Secondary | ICD-10-CM | POA: Diagnosis not present

## 2014-02-09 DIAGNOSIS — M25559 Pain in unspecified hip: Secondary | ICD-10-CM | POA: Diagnosis not present

## 2014-02-09 DIAGNOSIS — M9981 Other biomechanical lesions of cervical region: Secondary | ICD-10-CM | POA: Diagnosis not present

## 2014-02-09 DIAGNOSIS — M542 Cervicalgia: Secondary | ICD-10-CM | POA: Diagnosis not present

## 2014-02-16 DIAGNOSIS — M999 Biomechanical lesion, unspecified: Secondary | ICD-10-CM | POA: Diagnosis not present

## 2014-02-16 DIAGNOSIS — M25559 Pain in unspecified hip: Secondary | ICD-10-CM | POA: Diagnosis not present

## 2014-02-16 DIAGNOSIS — M545 Low back pain, unspecified: Secondary | ICD-10-CM | POA: Diagnosis not present

## 2014-02-16 DIAGNOSIS — M9981 Other biomechanical lesions of cervical region: Secondary | ICD-10-CM | POA: Diagnosis not present

## 2014-02-16 DIAGNOSIS — M542 Cervicalgia: Secondary | ICD-10-CM | POA: Diagnosis not present

## 2014-02-25 DIAGNOSIS — M9981 Other biomechanical lesions of cervical region: Secondary | ICD-10-CM | POA: Diagnosis not present

## 2014-02-25 DIAGNOSIS — M25559 Pain in unspecified hip: Secondary | ICD-10-CM | POA: Diagnosis not present

## 2014-02-25 DIAGNOSIS — M545 Low back pain, unspecified: Secondary | ICD-10-CM | POA: Diagnosis not present

## 2014-02-25 DIAGNOSIS — M999 Biomechanical lesion, unspecified: Secondary | ICD-10-CM | POA: Diagnosis not present

## 2014-02-25 DIAGNOSIS — M542 Cervicalgia: Secondary | ICD-10-CM | POA: Diagnosis not present

## 2014-02-26 DIAGNOSIS — M25559 Pain in unspecified hip: Secondary | ICD-10-CM | POA: Diagnosis not present

## 2014-02-26 DIAGNOSIS — M545 Low back pain, unspecified: Secondary | ICD-10-CM | POA: Diagnosis not present

## 2014-02-26 DIAGNOSIS — M9981 Other biomechanical lesions of cervical region: Secondary | ICD-10-CM | POA: Diagnosis not present

## 2014-02-26 DIAGNOSIS — M542 Cervicalgia: Secondary | ICD-10-CM | POA: Diagnosis not present

## 2014-02-26 DIAGNOSIS — M999 Biomechanical lesion, unspecified: Secondary | ICD-10-CM | POA: Diagnosis not present

## 2014-03-03 DIAGNOSIS — M999 Biomechanical lesion, unspecified: Secondary | ICD-10-CM | POA: Diagnosis not present

## 2014-03-03 DIAGNOSIS — M9981 Other biomechanical lesions of cervical region: Secondary | ICD-10-CM | POA: Diagnosis not present

## 2014-03-03 DIAGNOSIS — M25559 Pain in unspecified hip: Secondary | ICD-10-CM | POA: Diagnosis not present

## 2014-03-03 DIAGNOSIS — M545 Low back pain, unspecified: Secondary | ICD-10-CM | POA: Diagnosis not present

## 2014-03-03 DIAGNOSIS — M542 Cervicalgia: Secondary | ICD-10-CM | POA: Diagnosis not present

## 2014-03-08 DIAGNOSIS — M545 Low back pain, unspecified: Secondary | ICD-10-CM | POA: Diagnosis not present

## 2014-03-08 DIAGNOSIS — M25559 Pain in unspecified hip: Secondary | ICD-10-CM | POA: Diagnosis not present

## 2014-03-08 DIAGNOSIS — M542 Cervicalgia: Secondary | ICD-10-CM | POA: Diagnosis not present

## 2014-03-08 DIAGNOSIS — M999 Biomechanical lesion, unspecified: Secondary | ICD-10-CM | POA: Diagnosis not present

## 2014-03-08 DIAGNOSIS — M9981 Other biomechanical lesions of cervical region: Secondary | ICD-10-CM | POA: Diagnosis not present

## 2014-03-10 DIAGNOSIS — M545 Low back pain, unspecified: Secondary | ICD-10-CM | POA: Diagnosis not present

## 2014-03-10 DIAGNOSIS — M25559 Pain in unspecified hip: Secondary | ICD-10-CM | POA: Diagnosis not present

## 2014-03-10 DIAGNOSIS — M9981 Other biomechanical lesions of cervical region: Secondary | ICD-10-CM | POA: Diagnosis not present

## 2014-03-10 DIAGNOSIS — M999 Biomechanical lesion, unspecified: Secondary | ICD-10-CM | POA: Diagnosis not present

## 2014-03-10 DIAGNOSIS — M542 Cervicalgia: Secondary | ICD-10-CM | POA: Diagnosis not present

## 2014-03-17 DIAGNOSIS — R079 Chest pain, unspecified: Secondary | ICD-10-CM | POA: Diagnosis not present

## 2014-03-18 DIAGNOSIS — M25559 Pain in unspecified hip: Secondary | ICD-10-CM | POA: Diagnosis not present

## 2014-03-18 DIAGNOSIS — M542 Cervicalgia: Secondary | ICD-10-CM | POA: Diagnosis not present

## 2014-03-18 DIAGNOSIS — M999 Biomechanical lesion, unspecified: Secondary | ICD-10-CM | POA: Diagnosis not present

## 2014-03-18 DIAGNOSIS — M545 Low back pain, unspecified: Secondary | ICD-10-CM | POA: Diagnosis not present

## 2014-03-18 DIAGNOSIS — M9981 Other biomechanical lesions of cervical region: Secondary | ICD-10-CM | POA: Diagnosis not present

## 2014-03-22 DIAGNOSIS — M542 Cervicalgia: Secondary | ICD-10-CM | POA: Diagnosis not present

## 2014-03-22 DIAGNOSIS — M25559 Pain in unspecified hip: Secondary | ICD-10-CM | POA: Diagnosis not present

## 2014-03-22 DIAGNOSIS — M9981 Other biomechanical lesions of cervical region: Secondary | ICD-10-CM | POA: Diagnosis not present

## 2014-03-22 DIAGNOSIS — M545 Low back pain, unspecified: Secondary | ICD-10-CM | POA: Diagnosis not present

## 2014-03-22 DIAGNOSIS — M999 Biomechanical lesion, unspecified: Secondary | ICD-10-CM | POA: Diagnosis not present

## 2014-03-29 DIAGNOSIS — M545 Low back pain, unspecified: Secondary | ICD-10-CM | POA: Diagnosis not present

## 2014-03-29 DIAGNOSIS — M25559 Pain in unspecified hip: Secondary | ICD-10-CM | POA: Diagnosis not present

## 2014-03-29 DIAGNOSIS — M542 Cervicalgia: Secondary | ICD-10-CM | POA: Diagnosis not present

## 2014-03-29 DIAGNOSIS — M999 Biomechanical lesion, unspecified: Secondary | ICD-10-CM | POA: Diagnosis not present

## 2014-03-29 DIAGNOSIS — M9981 Other biomechanical lesions of cervical region: Secondary | ICD-10-CM | POA: Diagnosis not present

## 2014-03-31 ENCOUNTER — Ambulatory Visit (INDEPENDENT_AMBULATORY_CARE_PROVIDER_SITE_OTHER): Payer: Medicare Other | Admitting: Cardiovascular Disease

## 2014-03-31 ENCOUNTER — Encounter (INDEPENDENT_AMBULATORY_CARE_PROVIDER_SITE_OTHER): Payer: Self-pay

## 2014-03-31 ENCOUNTER — Encounter: Payer: Self-pay | Admitting: Cardiovascular Disease

## 2014-03-31 VITALS — BP 140/70 | HR 74 | Ht 67.0 in | Wt 182.8 lb

## 2014-03-31 DIAGNOSIS — R0789 Other chest pain: Secondary | ICD-10-CM

## 2014-03-31 DIAGNOSIS — K219 Gastro-esophageal reflux disease without esophagitis: Secondary | ICD-10-CM | POA: Insufficient documentation

## 2014-03-31 DIAGNOSIS — R079 Chest pain, unspecified: Secondary | ICD-10-CM | POA: Insufficient documentation

## 2014-03-31 DIAGNOSIS — E785 Hyperlipidemia, unspecified: Secondary | ICD-10-CM | POA: Insufficient documentation

## 2014-03-31 DIAGNOSIS — I658 Occlusion and stenosis of other precerebral arteries: Secondary | ICD-10-CM | POA: Diagnosis not present

## 2014-03-31 DIAGNOSIS — N529 Male erectile dysfunction, unspecified: Secondary | ICD-10-CM | POA: Insufficient documentation

## 2014-03-31 DIAGNOSIS — N528 Other male erectile dysfunction: Secondary | ICD-10-CM

## 2014-03-31 DIAGNOSIS — I6529 Occlusion and stenosis of unspecified carotid artery: Secondary | ICD-10-CM

## 2014-03-31 DIAGNOSIS — I6523 Occlusion and stenosis of bilateral carotid arteries: Secondary | ICD-10-CM

## 2014-03-31 HISTORY — DX: Hyperlipidemia, unspecified: E78.5

## 2014-03-31 NOTE — Assessment & Plan Note (Signed)
Currently taking a PPI. No symptoms of GERD at this time

## 2014-03-31 NOTE — Progress Notes (Signed)
Patient ID: Jordan [name], male    DOB: 31-Aug-1940, 73 y.o.   MRN: [id number]  HPI Comments: Jordan [name] is a 73 year old gentleman with a history of mild bilateral carotid arterial disease, patient of Dr. Crist Infante, Who presents for evaluation of chest pain.  He reports that symptoms started 2-3 weeks ago. 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  He has had chest pain lasting for over one week,  Worse with certain positions, described as a sharp pain in the deep part of his chest. It seems to be worst when he does sitting up from a supine position. He had no difficulty playing golf, biking, doing biking sprints, pushups. Despite this, with certain movements of his upper body, he is able to reproduce the pain. He denies having any pain in the last 4 days. Symptoms do seem to improve with Aleve  Currently he feels well with no complaints as symptoms have resolved. He denies having any smoking history. It was suggested to him that he increase his Crestor to 10 mg daily. Weight is up 5-7 pounds over the past 6 months as he has been eating more, exercising less  Lab work February 2015 showing total cholesterol 176, LDL 107, hemoglobin A1c 5.6  EKG on today's visit shows normal sinus rhythm with rate 74 beats per minute, no significant ST or T wave changes   Outpatient Encounter Prescriptions as of 03/31/2014  Medication Sig  . Calcium Citrate-Vitamin D (CITRACAL MAXIMUM) 315-250 MG-UNIT TABS Take 2 tablets by mouth daily.  . cholecalciferol (VITAMIN D) 1000 UNITS tablet Take 4,000 Units by mouth daily.   Marland Kitchen esomeprazole (NEXIUM) 40 MG capsule Take 40 mg by mouth daily before breakfast.    . meclizine (ANTIVERT) 25 MG tablet Take 25 mg by mouth daily.    . naproxen sodium (ANAPROX) 220 MG tablet Take 220 mg by mouth 2 (two) times daily with a meal.   . Omega-3 Fatty Acids (FISH OIL) 1000 MG CAPS Take 2 capsules by  mouth daily.   . peg 3350 powder (MOVIPREP) 100 G SOLR Take 2 kits by mouth.  . rosuvastatin (CRESTOR) 10 MG tablet Take 5 mg by mouth daily.   . tadalafil (CIALIS) 20 MG tablet Take 10 mg by mouth daily as needed.      Review of Systems  Constitutional: Negative.   HENT: Negative.   Eyes: Negative.   Respiratory: Positive for chest tightness.   Cardiovascular: Negative.   Gastrointestinal: Negative.   Endocrine: Negative.   Musculoskeletal: Negative.   Skin: Negative.   Allergic/Immunologic: Negative.   Neurological: Negative.   Hematological: Negative.   Psychiatric/Behavioral: Negative.   All other systems reviewed and are negative.   BP 140/70  Pulse 74  Ht 5\' 7"  (1.702 m)  Wt 182 lb 12 oz (82.895 kg)  BMI 28.62 kg/m2  Physical Exam  Nursing note and vitals reviewed. Constitutional: He is oriented to person, place, and time. He appears well-developed and well-nourished.  HENT:  Head: Normocephalic.  Nose: Nose normal.  Mouth/Throat: Oropharynx is clear and moist.  Eyes: Conjunctivae are normal. Pupils are equal, round, and reactive to light.  Neck: Normal range of motion. Neck supple. No JVD present.  Cardiovascular: Normal rate, regular rhythm, S1 normal, S2 normal, normal heart sounds and intact distal pulses.  Exam reveals no gallop and no friction rub.   No murmur heard.  Pulmonary/Chest: Effort normal and breath sounds normal. No respiratory distress. He has no wheezes. He has no rales. He exhibits no tenderness.  Abdominal: Soft. Bowel sounds are normal. He exhibits no distension. There is no tenderness.  Musculoskeletal: Normal range of motion. He exhibits no edema and no tenderness.  Lymphadenopathy:    He has no cervical adenopathy.  Neurological: He is alert and oriented to person, place, and time. Coordination normal.  Skin: Skin is warm and dry. No rash noted. No erythema.  Psychiatric: He has a normal mood and affect. His behavior is normal. Judgment and  thought content normal.      Assessment and Plan

## 2014-03-31 NOTE — Assessment & Plan Note (Signed)
Discussed his cholesterol with him. He does have mild bilateral carotid arterial disease. Recommended we aim for goal LDL at least less than 100, preferably closer to 70. No other significant risk factors. He is a nonsmoker, nondiabetic

## 2014-03-31 NOTE — Patient Instructions (Signed)
You are doing well. You are having musculoskeletal chest pain. No medication changes were made.  Strict goal for total cholesterol is <150, LDL <70  Watch the weight  Naproxen for pain relief  Please call us if you have new issues that need to be addressed before your next appt.

## 2014-03-31 NOTE — Assessment & Plan Note (Signed)
Chest pain is atypical in nature. Recommended he continue with his Aleve as needed for further symptoms. No further workup at this time.

## 2014-03-31 NOTE — Assessment & Plan Note (Signed)
Minimal carotid disease bilaterally. Recommended he try to lose several pounds, continue on his cholesterol medication.

## 2014-04-01 DIAGNOSIS — M545 Low back pain, unspecified: Secondary | ICD-10-CM | POA: Diagnosis not present

## 2014-04-01 DIAGNOSIS — M999 Biomechanical lesion, unspecified: Secondary | ICD-10-CM | POA: Diagnosis not present

## 2014-04-01 DIAGNOSIS — M25559 Pain in unspecified hip: Secondary | ICD-10-CM | POA: Diagnosis not present

## 2014-04-01 DIAGNOSIS — M542 Cervicalgia: Secondary | ICD-10-CM | POA: Diagnosis not present

## 2014-04-01 DIAGNOSIS — M9981 Other biomechanical lesions of cervical region: Secondary | ICD-10-CM | POA: Diagnosis not present

## 2014-04-06 DIAGNOSIS — M25559 Pain in unspecified hip: Secondary | ICD-10-CM | POA: Diagnosis not present

## 2014-04-06 DIAGNOSIS — M545 Low back pain, unspecified: Secondary | ICD-10-CM | POA: Diagnosis not present

## 2014-04-06 DIAGNOSIS — M999 Biomechanical lesion, unspecified: Secondary | ICD-10-CM | POA: Diagnosis not present

## 2014-04-06 DIAGNOSIS — M9981 Other biomechanical lesions of cervical region: Secondary | ICD-10-CM | POA: Diagnosis not present

## 2014-04-06 DIAGNOSIS — M542 Cervicalgia: Secondary | ICD-10-CM | POA: Diagnosis not present

## 2014-04-15 ENCOUNTER — Encounter: Payer: Self-pay | Admitting: Internal Medicine

## 2014-04-15 DIAGNOSIS — M545 Low back pain, unspecified: Secondary | ICD-10-CM | POA: Diagnosis not present

## 2014-04-15 DIAGNOSIS — M25559 Pain in unspecified hip: Secondary | ICD-10-CM | POA: Diagnosis not present

## 2014-04-15 DIAGNOSIS — M9981 Other biomechanical lesions of cervical region: Secondary | ICD-10-CM | POA: Diagnosis not present

## 2014-04-15 DIAGNOSIS — M999 Biomechanical lesion, unspecified: Secondary | ICD-10-CM | POA: Diagnosis not present

## 2014-04-15 DIAGNOSIS — M542 Cervicalgia: Secondary | ICD-10-CM | POA: Diagnosis not present

## 2014-06-03 DIAGNOSIS — L814 Other melanin hyperpigmentation: Secondary | ICD-10-CM | POA: Diagnosis not present

## 2014-06-03 DIAGNOSIS — L821 Other seborrheic keratosis: Secondary | ICD-10-CM | POA: Diagnosis not present

## 2014-06-03 DIAGNOSIS — D225 Melanocytic nevi of trunk: Secondary | ICD-10-CM | POA: Diagnosis not present

## 2014-06-03 DIAGNOSIS — L570 Actinic keratosis: Secondary | ICD-10-CM | POA: Diagnosis not present

## 2014-06-03 DIAGNOSIS — L820 Inflamed seborrheic keratosis: Secondary | ICD-10-CM | POA: Diagnosis not present

## 2014-06-03 DIAGNOSIS — D485 Neoplasm of uncertain behavior of skin: Secondary | ICD-10-CM | POA: Diagnosis not present

## 2014-06-03 DIAGNOSIS — Z85828 Personal history of other malignant neoplasm of skin: Secondary | ICD-10-CM | POA: Diagnosis not present

## 2014-07-02 DIAGNOSIS — Z23 Encounter for immunization: Secondary | ICD-10-CM | POA: Diagnosis not present

## 2014-10-19 DIAGNOSIS — R7301 Impaired fasting glucose: Secondary | ICD-10-CM | POA: Diagnosis not present

## 2014-10-19 DIAGNOSIS — E785 Hyperlipidemia, unspecified: Secondary | ICD-10-CM | POA: Diagnosis not present

## 2014-10-19 DIAGNOSIS — Z125 Encounter for screening for malignant neoplasm of prostate: Secondary | ICD-10-CM | POA: Diagnosis not present

## 2014-10-19 DIAGNOSIS — I2510 Atherosclerotic heart disease of native coronary artery without angina pectoris: Secondary | ICD-10-CM | POA: Diagnosis not present

## 2014-10-19 DIAGNOSIS — Z008 Encounter for other general examination: Secondary | ICD-10-CM | POA: Diagnosis not present

## 2014-10-26 DIAGNOSIS — M25519 Pain in unspecified shoulder: Secondary | ICD-10-CM | POA: Diagnosis not present

## 2014-10-26 DIAGNOSIS — K219 Gastro-esophageal reflux disease without esophagitis: Secondary | ICD-10-CM | POA: Diagnosis not present

## 2014-10-26 DIAGNOSIS — M1990 Unspecified osteoarthritis, unspecified site: Secondary | ICD-10-CM | POA: Diagnosis not present

## 2014-10-26 DIAGNOSIS — E785 Hyperlipidemia, unspecified: Secondary | ICD-10-CM | POA: Diagnosis not present

## 2014-10-26 DIAGNOSIS — Z125 Encounter for screening for malignant neoplasm of prostate: Secondary | ICD-10-CM | POA: Diagnosis not present

## 2014-10-26 DIAGNOSIS — R7301 Impaired fasting glucose: Secondary | ICD-10-CM | POA: Diagnosis not present

## 2014-10-26 DIAGNOSIS — Z1389 Encounter for screening for other disorder: Secondary | ICD-10-CM | POA: Diagnosis not present

## 2014-10-26 DIAGNOSIS — I6529 Occlusion and stenosis of unspecified carotid artery: Secondary | ICD-10-CM | POA: Diagnosis not present

## 2014-10-26 DIAGNOSIS — Z0000 Encounter for general adult medical examination without abnormal findings: Secondary | ICD-10-CM | POA: Diagnosis not present

## 2014-10-26 DIAGNOSIS — Z6827 Body mass index (BMI) 27.0-27.9, adult: Secondary | ICD-10-CM | POA: Diagnosis not present

## 2014-10-26 DIAGNOSIS — N529 Male erectile dysfunction, unspecified: Secondary | ICD-10-CM | POA: Diagnosis not present

## 2014-11-23 DIAGNOSIS — Z85828 Personal history of other malignant neoplasm of skin: Secondary | ICD-10-CM | POA: Diagnosis not present

## 2015-02-09 DIAGNOSIS — M542 Cervicalgia: Secondary | ICD-10-CM | POA: Diagnosis not present

## 2015-02-09 DIAGNOSIS — M9904 Segmental and somatic dysfunction of sacral region: Secondary | ICD-10-CM | POA: Diagnosis not present

## 2015-02-09 DIAGNOSIS — M461 Sacroiliitis, not elsewhere classified: Secondary | ICD-10-CM | POA: Diagnosis not present

## 2015-02-09 DIAGNOSIS — M9905 Segmental and somatic dysfunction of pelvic region: Secondary | ICD-10-CM | POA: Diagnosis not present

## 2015-02-09 DIAGNOSIS — M545 Low back pain: Secondary | ICD-10-CM | POA: Diagnosis not present

## 2015-02-09 DIAGNOSIS — M9901 Segmental and somatic dysfunction of cervical region: Secondary | ICD-10-CM | POA: Diagnosis not present

## 2015-02-09 DIAGNOSIS — M9903 Segmental and somatic dysfunction of lumbar region: Secondary | ICD-10-CM | POA: Diagnosis not present

## 2015-02-15 DIAGNOSIS — M9903 Segmental and somatic dysfunction of lumbar region: Secondary | ICD-10-CM | POA: Diagnosis not present

## 2015-02-15 DIAGNOSIS — M9904 Segmental and somatic dysfunction of sacral region: Secondary | ICD-10-CM | POA: Diagnosis not present

## 2015-02-15 DIAGNOSIS — M461 Sacroiliitis, not elsewhere classified: Secondary | ICD-10-CM | POA: Diagnosis not present

## 2015-02-15 DIAGNOSIS — M9905 Segmental and somatic dysfunction of pelvic region: Secondary | ICD-10-CM | POA: Diagnosis not present

## 2015-02-15 DIAGNOSIS — M9901 Segmental and somatic dysfunction of cervical region: Secondary | ICD-10-CM | POA: Diagnosis not present

## 2015-02-15 DIAGNOSIS — M542 Cervicalgia: Secondary | ICD-10-CM | POA: Diagnosis not present

## 2015-02-15 DIAGNOSIS — M545 Low back pain: Secondary | ICD-10-CM | POA: Diagnosis not present

## 2015-03-07 DIAGNOSIS — M542 Cervicalgia: Secondary | ICD-10-CM | POA: Diagnosis not present

## 2015-03-07 DIAGNOSIS — M9903 Segmental and somatic dysfunction of lumbar region: Secondary | ICD-10-CM | POA: Diagnosis not present

## 2015-03-07 DIAGNOSIS — M9901 Segmental and somatic dysfunction of cervical region: Secondary | ICD-10-CM | POA: Diagnosis not present

## 2015-03-07 DIAGNOSIS — M9905 Segmental and somatic dysfunction of pelvic region: Secondary | ICD-10-CM | POA: Diagnosis not present

## 2015-03-07 DIAGNOSIS — M9904 Segmental and somatic dysfunction of sacral region: Secondary | ICD-10-CM | POA: Diagnosis not present

## 2015-03-07 DIAGNOSIS — M461 Sacroiliitis, not elsewhere classified: Secondary | ICD-10-CM | POA: Diagnosis not present

## 2015-03-07 DIAGNOSIS — M545 Low back pain: Secondary | ICD-10-CM | POA: Diagnosis not present

## 2015-03-17 DIAGNOSIS — M9901 Segmental and somatic dysfunction of cervical region: Secondary | ICD-10-CM | POA: Diagnosis not present

## 2015-03-17 DIAGNOSIS — M461 Sacroiliitis, not elsewhere classified: Secondary | ICD-10-CM | POA: Diagnosis not present

## 2015-03-17 DIAGNOSIS — M9903 Segmental and somatic dysfunction of lumbar region: Secondary | ICD-10-CM | POA: Diagnosis not present

## 2015-03-17 DIAGNOSIS — M545 Low back pain: Secondary | ICD-10-CM | POA: Diagnosis not present

## 2015-03-17 DIAGNOSIS — M9905 Segmental and somatic dysfunction of pelvic region: Secondary | ICD-10-CM | POA: Diagnosis not present

## 2015-03-17 DIAGNOSIS — M542 Cervicalgia: Secondary | ICD-10-CM | POA: Diagnosis not present

## 2015-03-17 DIAGNOSIS — M9904 Segmental and somatic dysfunction of sacral region: Secondary | ICD-10-CM | POA: Diagnosis not present

## 2015-03-31 DIAGNOSIS — M9901 Segmental and somatic dysfunction of cervical region: Secondary | ICD-10-CM | POA: Diagnosis not present

## 2015-03-31 DIAGNOSIS — M461 Sacroiliitis, not elsewhere classified: Secondary | ICD-10-CM | POA: Diagnosis not present

## 2015-03-31 DIAGNOSIS — M542 Cervicalgia: Secondary | ICD-10-CM | POA: Diagnosis not present

## 2015-03-31 DIAGNOSIS — M9904 Segmental and somatic dysfunction of sacral region: Secondary | ICD-10-CM | POA: Diagnosis not present

## 2015-03-31 DIAGNOSIS — M9903 Segmental and somatic dysfunction of lumbar region: Secondary | ICD-10-CM | POA: Diagnosis not present

## 2015-03-31 DIAGNOSIS — M545 Low back pain: Secondary | ICD-10-CM | POA: Diagnosis not present

## 2015-03-31 DIAGNOSIS — M9905 Segmental and somatic dysfunction of pelvic region: Secondary | ICD-10-CM | POA: Diagnosis not present

## 2015-04-08 DIAGNOSIS — M9903 Segmental and somatic dysfunction of lumbar region: Secondary | ICD-10-CM | POA: Diagnosis not present

## 2015-04-08 DIAGNOSIS — M9905 Segmental and somatic dysfunction of pelvic region: Secondary | ICD-10-CM | POA: Diagnosis not present

## 2015-04-08 DIAGNOSIS — M9904 Segmental and somatic dysfunction of sacral region: Secondary | ICD-10-CM | POA: Diagnosis not present

## 2015-04-08 DIAGNOSIS — M542 Cervicalgia: Secondary | ICD-10-CM | POA: Diagnosis not present

## 2015-04-08 DIAGNOSIS — M9901 Segmental and somatic dysfunction of cervical region: Secondary | ICD-10-CM | POA: Diagnosis not present

## 2015-04-08 DIAGNOSIS — M545 Low back pain: Secondary | ICD-10-CM | POA: Diagnosis not present

## 2015-04-08 DIAGNOSIS — M461 Sacroiliitis, not elsewhere classified: Secondary | ICD-10-CM | POA: Diagnosis not present

## 2015-04-13 DIAGNOSIS — M545 Low back pain: Secondary | ICD-10-CM | POA: Diagnosis not present

## 2015-04-13 DIAGNOSIS — M9905 Segmental and somatic dysfunction of pelvic region: Secondary | ICD-10-CM | POA: Diagnosis not present

## 2015-04-13 DIAGNOSIS — M9904 Segmental and somatic dysfunction of sacral region: Secondary | ICD-10-CM | POA: Diagnosis not present

## 2015-04-13 DIAGNOSIS — M9901 Segmental and somatic dysfunction of cervical region: Secondary | ICD-10-CM | POA: Diagnosis not present

## 2015-04-13 DIAGNOSIS — M9903 Segmental and somatic dysfunction of lumbar region: Secondary | ICD-10-CM | POA: Diagnosis not present

## 2015-04-13 DIAGNOSIS — M461 Sacroiliitis, not elsewhere classified: Secondary | ICD-10-CM | POA: Diagnosis not present

## 2015-04-13 DIAGNOSIS — M542 Cervicalgia: Secondary | ICD-10-CM | POA: Diagnosis not present

## 2015-04-21 DIAGNOSIS — M9901 Segmental and somatic dysfunction of cervical region: Secondary | ICD-10-CM | POA: Diagnosis not present

## 2015-04-21 DIAGNOSIS — M545 Low back pain: Secondary | ICD-10-CM | POA: Diagnosis not present

## 2015-04-21 DIAGNOSIS — M461 Sacroiliitis, not elsewhere classified: Secondary | ICD-10-CM | POA: Diagnosis not present

## 2015-04-21 DIAGNOSIS — M9904 Segmental and somatic dysfunction of sacral region: Secondary | ICD-10-CM | POA: Diagnosis not present

## 2015-04-21 DIAGNOSIS — M9905 Segmental and somatic dysfunction of pelvic region: Secondary | ICD-10-CM | POA: Diagnosis not present

## 2015-04-21 DIAGNOSIS — M9903 Segmental and somatic dysfunction of lumbar region: Secondary | ICD-10-CM | POA: Diagnosis not present

## 2015-04-21 DIAGNOSIS — M542 Cervicalgia: Secondary | ICD-10-CM | POA: Diagnosis not present

## 2015-06-13 DIAGNOSIS — L570 Actinic keratosis: Secondary | ICD-10-CM | POA: Diagnosis not present

## 2015-06-13 DIAGNOSIS — Z85828 Personal history of other malignant neoplasm of skin: Secondary | ICD-10-CM | POA: Diagnosis not present

## 2015-06-13 DIAGNOSIS — L821 Other seborrheic keratosis: Secondary | ICD-10-CM | POA: Diagnosis not present

## 2015-06-13 DIAGNOSIS — L738 Other specified follicular disorders: Secondary | ICD-10-CM | POA: Diagnosis not present

## 2015-06-13 DIAGNOSIS — L812 Freckles: Secondary | ICD-10-CM | POA: Diagnosis not present

## 2015-06-13 DIAGNOSIS — D485 Neoplasm of uncertain behavior of skin: Secondary | ICD-10-CM | POA: Diagnosis not present

## 2015-06-16 DIAGNOSIS — Z23 Encounter for immunization: Secondary | ICD-10-CM | POA: Diagnosis not present

## 2015-07-04 DIAGNOSIS — D485 Neoplasm of uncertain behavior of skin: Secondary | ICD-10-CM | POA: Diagnosis not present

## 2015-07-04 DIAGNOSIS — Z85828 Personal history of other malignant neoplasm of skin: Secondary | ICD-10-CM | POA: Diagnosis not present

## 2015-07-04 DIAGNOSIS — L570 Actinic keratosis: Secondary | ICD-10-CM | POA: Diagnosis not present

## 2015-07-04 DIAGNOSIS — L820 Inflamed seborrheic keratosis: Secondary | ICD-10-CM | POA: Diagnosis not present

## 2015-10-25 DIAGNOSIS — D3132 Benign neoplasm of left choroid: Secondary | ICD-10-CM | POA: Diagnosis not present

## 2015-10-25 DIAGNOSIS — H2513 Age-related nuclear cataract, bilateral: Secondary | ICD-10-CM | POA: Diagnosis not present

## 2015-10-25 DIAGNOSIS — Z96651 Presence of right artificial knee joint: Secondary | ICD-10-CM | POA: Diagnosis not present

## 2015-10-25 DIAGNOSIS — M19011 Primary osteoarthritis, right shoulder: Secondary | ICD-10-CM | POA: Diagnosis not present

## 2015-10-25 DIAGNOSIS — M25511 Pain in right shoulder: Secondary | ICD-10-CM | POA: Diagnosis not present

## 2015-10-25 DIAGNOSIS — H04123 Dry eye syndrome of bilateral lacrimal glands: Secondary | ICD-10-CM | POA: Diagnosis not present

## 2015-10-25 DIAGNOSIS — M7651 Patellar tendinitis, right knee: Secondary | ICD-10-CM | POA: Diagnosis not present

## 2015-10-25 DIAGNOSIS — Z471 Aftercare following joint replacement surgery: Secondary | ICD-10-CM | POA: Diagnosis not present

## 2015-10-25 DIAGNOSIS — M7591 Shoulder lesion, unspecified, right shoulder: Secondary | ICD-10-CM | POA: Diagnosis not present

## 2015-12-13 DIAGNOSIS — M791 Myalgia: Secondary | ICD-10-CM | POA: Diagnosis not present

## 2015-12-13 DIAGNOSIS — M25511 Pain in right shoulder: Secondary | ICD-10-CM | POA: Diagnosis not present

## 2015-12-13 DIAGNOSIS — G8929 Other chronic pain: Secondary | ICD-10-CM | POA: Diagnosis not present

## 2015-12-13 DIAGNOSIS — M25551 Pain in right hip: Secondary | ICD-10-CM | POA: Diagnosis not present

## 2015-12-26 DIAGNOSIS — E784 Other hyperlipidemia: Secondary | ICD-10-CM | POA: Diagnosis not present

## 2015-12-26 DIAGNOSIS — K219 Gastro-esophageal reflux disease without esophagitis: Secondary | ICD-10-CM | POA: Diagnosis not present

## 2015-12-26 DIAGNOSIS — D126 Benign neoplasm of colon, unspecified: Secondary | ICD-10-CM | POA: Diagnosis not present

## 2015-12-26 DIAGNOSIS — Z6827 Body mass index (BMI) 27.0-27.9, adult: Secondary | ICD-10-CM | POA: Diagnosis not present

## 2015-12-26 DIAGNOSIS — N529 Male erectile dysfunction, unspecified: Secondary | ICD-10-CM | POA: Diagnosis not present

## 2015-12-26 DIAGNOSIS — M1990 Unspecified osteoarthritis, unspecified site: Secondary | ICD-10-CM | POA: Diagnosis not present

## 2015-12-26 DIAGNOSIS — Z1389 Encounter for screening for other disorder: Secondary | ICD-10-CM | POA: Diagnosis not present

## 2015-12-26 DIAGNOSIS — I6529 Occlusion and stenosis of unspecified carotid artery: Secondary | ICD-10-CM | POA: Diagnosis not present

## 2015-12-26 DIAGNOSIS — E785 Hyperlipidemia, unspecified: Secondary | ICD-10-CM | POA: Diagnosis not present

## 2015-12-26 DIAGNOSIS — Z0000 Encounter for general adult medical examination without abnormal findings: Secondary | ICD-10-CM | POA: Diagnosis not present

## 2015-12-26 DIAGNOSIS — Z125 Encounter for screening for malignant neoplasm of prostate: Secondary | ICD-10-CM | POA: Diagnosis not present

## 2015-12-26 DIAGNOSIS — H8103 Meniere's disease, bilateral: Secondary | ICD-10-CM | POA: Diagnosis not present

## 2015-12-26 DIAGNOSIS — M25561 Pain in right knee: Secondary | ICD-10-CM | POA: Diagnosis not present

## 2015-12-26 DIAGNOSIS — R7301 Impaired fasting glucose: Secondary | ICD-10-CM | POA: Diagnosis not present

## 2015-12-26 DIAGNOSIS — I10 Essential (primary) hypertension: Secondary | ICD-10-CM | POA: Diagnosis not present

## 2015-12-28 DIAGNOSIS — M9905 Segmental and somatic dysfunction of pelvic region: Secondary | ICD-10-CM | POA: Diagnosis not present

## 2015-12-28 DIAGNOSIS — M9903 Segmental and somatic dysfunction of lumbar region: Secondary | ICD-10-CM | POA: Diagnosis not present

## 2015-12-28 DIAGNOSIS — Q72811 Congenital shortening of right lower limb: Secondary | ICD-10-CM | POA: Diagnosis not present

## 2015-12-28 DIAGNOSIS — M791 Myalgia: Secondary | ICD-10-CM | POA: Diagnosis not present

## 2015-12-28 DIAGNOSIS — M5136 Other intervertebral disc degeneration, lumbar region: Secondary | ICD-10-CM | POA: Diagnosis not present

## 2015-12-28 DIAGNOSIS — M9904 Segmental and somatic dysfunction of sacral region: Secondary | ICD-10-CM | POA: Diagnosis not present

## 2015-12-29 DIAGNOSIS — M791 Myalgia: Secondary | ICD-10-CM | POA: Diagnosis not present

## 2015-12-29 DIAGNOSIS — M5136 Other intervertebral disc degeneration, lumbar region: Secondary | ICD-10-CM | POA: Diagnosis not present

## 2015-12-29 DIAGNOSIS — M9904 Segmental and somatic dysfunction of sacral region: Secondary | ICD-10-CM | POA: Diagnosis not present

## 2015-12-29 DIAGNOSIS — M9903 Segmental and somatic dysfunction of lumbar region: Secondary | ICD-10-CM | POA: Diagnosis not present

## 2015-12-29 DIAGNOSIS — Q72811 Congenital shortening of right lower limb: Secondary | ICD-10-CM | POA: Diagnosis not present

## 2015-12-29 DIAGNOSIS — M9905 Segmental and somatic dysfunction of pelvic region: Secondary | ICD-10-CM | POA: Diagnosis not present

## 2016-01-02 DIAGNOSIS — M9904 Segmental and somatic dysfunction of sacral region: Secondary | ICD-10-CM | POA: Diagnosis not present

## 2016-01-02 DIAGNOSIS — M9905 Segmental and somatic dysfunction of pelvic region: Secondary | ICD-10-CM | POA: Diagnosis not present

## 2016-01-02 DIAGNOSIS — M5136 Other intervertebral disc degeneration, lumbar region: Secondary | ICD-10-CM | POA: Diagnosis not present

## 2016-01-02 DIAGNOSIS — Q72811 Congenital shortening of right lower limb: Secondary | ICD-10-CM | POA: Diagnosis not present

## 2016-01-02 DIAGNOSIS — M9903 Segmental and somatic dysfunction of lumbar region: Secondary | ICD-10-CM | POA: Diagnosis not present

## 2016-01-02 DIAGNOSIS — M791 Myalgia: Secondary | ICD-10-CM | POA: Diagnosis not present

## 2016-01-03 DIAGNOSIS — Z6826 Body mass index (BMI) 26.0-26.9, adult: Secondary | ICD-10-CM | POA: Diagnosis not present

## 2016-01-03 DIAGNOSIS — I10 Essential (primary) hypertension: Secondary | ICD-10-CM | POA: Diagnosis not present

## 2016-01-04 DIAGNOSIS — Q72811 Congenital shortening of right lower limb: Secondary | ICD-10-CM | POA: Diagnosis not present

## 2016-01-04 DIAGNOSIS — M9905 Segmental and somatic dysfunction of pelvic region: Secondary | ICD-10-CM | POA: Diagnosis not present

## 2016-01-04 DIAGNOSIS — M9904 Segmental and somatic dysfunction of sacral region: Secondary | ICD-10-CM | POA: Diagnosis not present

## 2016-01-04 DIAGNOSIS — M791 Myalgia: Secondary | ICD-10-CM | POA: Diagnosis not present

## 2016-01-04 DIAGNOSIS — M9903 Segmental and somatic dysfunction of lumbar region: Secondary | ICD-10-CM | POA: Diagnosis not present

## 2016-01-04 DIAGNOSIS — M5136 Other intervertebral disc degeneration, lumbar region: Secondary | ICD-10-CM | POA: Diagnosis not present

## 2016-01-05 DIAGNOSIS — I10 Essential (primary) hypertension: Secondary | ICD-10-CM | POA: Diagnosis not present

## 2016-01-09 DIAGNOSIS — M9903 Segmental and somatic dysfunction of lumbar region: Secondary | ICD-10-CM | POA: Diagnosis not present

## 2016-01-09 DIAGNOSIS — M5136 Other intervertebral disc degeneration, lumbar region: Secondary | ICD-10-CM | POA: Diagnosis not present

## 2016-01-09 DIAGNOSIS — M9905 Segmental and somatic dysfunction of pelvic region: Secondary | ICD-10-CM | POA: Diagnosis not present

## 2016-01-09 DIAGNOSIS — Q72811 Congenital shortening of right lower limb: Secondary | ICD-10-CM | POA: Diagnosis not present

## 2016-01-09 DIAGNOSIS — M791 Myalgia: Secondary | ICD-10-CM | POA: Diagnosis not present

## 2016-01-09 DIAGNOSIS — M9904 Segmental and somatic dysfunction of sacral region: Secondary | ICD-10-CM | POA: Diagnosis not present

## 2016-02-03 DIAGNOSIS — M545 Low back pain: Secondary | ICD-10-CM | POA: Diagnosis not present

## 2016-02-03 DIAGNOSIS — M461 Sacroiliitis, not elsewhere classified: Secondary | ICD-10-CM | POA: Diagnosis not present

## 2016-02-03 DIAGNOSIS — M9903 Segmental and somatic dysfunction of lumbar region: Secondary | ICD-10-CM | POA: Diagnosis not present

## 2016-02-03 DIAGNOSIS — M9904 Segmental and somatic dysfunction of sacral region: Secondary | ICD-10-CM | POA: Diagnosis not present

## 2016-02-09 DIAGNOSIS — M9903 Segmental and somatic dysfunction of lumbar region: Secondary | ICD-10-CM | POA: Diagnosis not present

## 2016-02-09 DIAGNOSIS — M9904 Segmental and somatic dysfunction of sacral region: Secondary | ICD-10-CM | POA: Diagnosis not present

## 2016-02-09 DIAGNOSIS — M545 Low back pain: Secondary | ICD-10-CM | POA: Diagnosis not present

## 2016-02-09 DIAGNOSIS — M461 Sacroiliitis, not elsewhere classified: Secondary | ICD-10-CM | POA: Diagnosis not present

## 2016-02-13 ENCOUNTER — Other Ambulatory Visit (HOSPITAL_COMMUNITY): Payer: Self-pay | Admitting: Internal Medicine

## 2016-02-13 DIAGNOSIS — I739 Peripheral vascular disease, unspecified: Principal | ICD-10-CM

## 2016-02-13 DIAGNOSIS — M461 Sacroiliitis, not elsewhere classified: Secondary | ICD-10-CM | POA: Diagnosis not present

## 2016-02-13 DIAGNOSIS — I779 Disorder of arteries and arterioles, unspecified: Secondary | ICD-10-CM

## 2016-02-13 DIAGNOSIS — M545 Low back pain: Secondary | ICD-10-CM | POA: Diagnosis not present

## 2016-02-13 DIAGNOSIS — M9904 Segmental and somatic dysfunction of sacral region: Secondary | ICD-10-CM | POA: Diagnosis not present

## 2016-02-13 DIAGNOSIS — M9903 Segmental and somatic dysfunction of lumbar region: Secondary | ICD-10-CM | POA: Diagnosis not present

## 2016-02-14 ENCOUNTER — Ambulatory Visit (HOSPITAL_COMMUNITY)
Admission: RE | Admit: 2016-02-14 | Discharge: 2016-02-14 | Disposition: A | Discharge status: Home / Self Care | Payer: Medicare Other | Source: Ambulatory Visit | Attending: Vascular Surgery | Admitting: Vascular Surgery

## 2016-02-14 DIAGNOSIS — K219 Gastro-esophageal reflux disease without esophagitis: Secondary | ICD-10-CM | POA: Diagnosis not present

## 2016-02-14 DIAGNOSIS — E785 Hyperlipidemia, unspecified: Secondary | ICD-10-CM | POA: Insufficient documentation

## 2016-02-14 DIAGNOSIS — I779 Disorder of arteries and arterioles, unspecified: Secondary | ICD-10-CM | POA: Insufficient documentation

## 2016-02-14 DIAGNOSIS — I6523 Occlusion and stenosis of bilateral carotid arteries: Secondary | ICD-10-CM | POA: Diagnosis not present

## 2016-02-14 DIAGNOSIS — I10 Essential (primary) hypertension: Secondary | ICD-10-CM | POA: Diagnosis not present

## 2016-02-14 DIAGNOSIS — I739 Peripheral vascular disease, unspecified: Secondary | ICD-10-CM

## 2016-02-14 LAB — VAS US CAROTID
LCCADDIAS: -25 cm/s
LCCADSYS: -122 cm/s
LCCAPDIAS: 24 cm/s
LEFT ECA DIAS: 0 cm/s
LICADDIAS: -35 cm/s
LICADSYS: -89 cm/s
Left CCA prox sys: 136 cm/s
Left ICA prox dias: 14 cm/s
Left ICA prox sys: 113 cm/s
RCCAPDIAS: 24 cm/s
RIGHT CCA MID DIAS: 31 cm/s
RIGHT ECA DIAS: 0 cm/s
Right CCA prox sys: 137 cm/s
Right cca dist sys: -101 cm/s

## 2016-03-12 DIAGNOSIS — M25511 Pain in right shoulder: Secondary | ICD-10-CM | POA: Diagnosis not present

## 2016-03-12 DIAGNOSIS — M65819 Other synovitis and tenosynovitis, unspecified shoulder: Secondary | ICD-10-CM | POA: Diagnosis not present

## 2016-03-19 DIAGNOSIS — M25411 Effusion, right shoulder: Secondary | ICD-10-CM | POA: Diagnosis not present

## 2016-03-19 DIAGNOSIS — M19011 Primary osteoarthritis, right shoulder: Secondary | ICD-10-CM | POA: Diagnosis not present

## 2016-03-19 DIAGNOSIS — M7551 Bursitis of right shoulder: Secondary | ICD-10-CM | POA: Diagnosis not present

## 2016-03-19 DIAGNOSIS — M75121 Complete rotator cuff tear or rupture of right shoulder, not specified as traumatic: Secondary | ICD-10-CM | POA: Diagnosis not present

## 2016-03-19 DIAGNOSIS — S46811A Strain of other muscles, fascia and tendons at shoulder and upper arm level, right arm, initial encounter: Secondary | ICD-10-CM | POA: Diagnosis not present

## 2016-03-22 DIAGNOSIS — M65811 Other synovitis and tenosynovitis, right shoulder: Secondary | ICD-10-CM | POA: Diagnosis not present

## 2016-03-22 DIAGNOSIS — M19011 Primary osteoarthritis, right shoulder: Secondary | ICD-10-CM | POA: Diagnosis not present

## 2016-03-22 DIAGNOSIS — M25511 Pain in right shoulder: Secondary | ICD-10-CM | POA: Diagnosis not present

## 2016-04-09 DIAGNOSIS — Z23 Encounter for immunization: Secondary | ICD-10-CM | POA: Diagnosis not present

## 2016-04-12 DIAGNOSIS — G8929 Other chronic pain: Secondary | ICD-10-CM | POA: Diagnosis not present

## 2016-04-12 DIAGNOSIS — M75121 Complete rotator cuff tear or rupture of right shoulder, not specified as traumatic: Secondary | ICD-10-CM | POA: Diagnosis not present

## 2016-04-18 DIAGNOSIS — Z96611 Presence of right artificial shoulder joint: Secondary | ICD-10-CM | POA: Diagnosis not present

## 2016-04-18 DIAGNOSIS — Z9889 Other specified postprocedural states: Secondary | ICD-10-CM | POA: Diagnosis not present

## 2016-04-18 DIAGNOSIS — Z8739 Personal history of other diseases of the musculoskeletal system and connective tissue: Secondary | ICD-10-CM | POA: Diagnosis not present

## 2016-04-18 DIAGNOSIS — M75121 Complete rotator cuff tear or rupture of right shoulder, not specified as traumatic: Secondary | ICD-10-CM | POA: Diagnosis not present

## 2016-05-21 DIAGNOSIS — M7521 Bicipital tendinitis, right shoulder: Secondary | ICD-10-CM | POA: Diagnosis not present

## 2016-05-21 DIAGNOSIS — Z79899 Other long term (current) drug therapy: Secondary | ICD-10-CM | POA: Diagnosis not present

## 2016-05-21 DIAGNOSIS — M7541 Impingement syndrome of right shoulder: Secondary | ICD-10-CM | POA: Diagnosis not present

## 2016-05-21 DIAGNOSIS — M170 Bilateral primary osteoarthritis of knee: Secondary | ICD-10-CM | POA: Diagnosis not present

## 2016-05-21 DIAGNOSIS — I2510 Atherosclerotic heart disease of native coronary artery without angina pectoris: Secondary | ICD-10-CM | POA: Diagnosis not present

## 2016-05-21 DIAGNOSIS — M659 Synovitis and tenosynovitis, unspecified: Secondary | ICD-10-CM | POA: Diagnosis not present

## 2016-05-21 DIAGNOSIS — I10 Essential (primary) hypertension: Secondary | ICD-10-CM | POA: Diagnosis not present

## 2016-05-21 DIAGNOSIS — E785 Hyperlipidemia, unspecified: Secondary | ICD-10-CM | POA: Diagnosis not present

## 2016-05-21 DIAGNOSIS — M75121 Complete rotator cuff tear or rupture of right shoulder, not specified as traumatic: Secondary | ICD-10-CM | POA: Diagnosis not present

## 2016-05-21 DIAGNOSIS — Z96651 Presence of right artificial knee joint: Secondary | ICD-10-CM | POA: Diagnosis not present

## 2016-05-21 DIAGNOSIS — N529 Male erectile dysfunction, unspecified: Secondary | ICD-10-CM | POA: Diagnosis not present

## 2016-05-21 DIAGNOSIS — K219 Gastro-esophageal reflux disease without esophagitis: Secondary | ICD-10-CM | POA: Diagnosis not present

## 2016-05-28 DIAGNOSIS — E785 Hyperlipidemia, unspecified: Secondary | ICD-10-CM | POA: Diagnosis not present

## 2016-05-28 DIAGNOSIS — R3129 Other microscopic hematuria: Secondary | ICD-10-CM | POA: Diagnosis not present

## 2016-05-28 DIAGNOSIS — E784 Other hyperlipidemia: Secondary | ICD-10-CM | POA: Diagnosis not present

## 2016-06-05 DIAGNOSIS — Z85828 Personal history of other malignant neoplasm of skin: Secondary | ICD-10-CM | POA: Diagnosis not present

## 2016-06-05 DIAGNOSIS — D692 Other nonthrombocytopenic purpura: Secondary | ICD-10-CM | POA: Diagnosis not present

## 2016-06-05 DIAGNOSIS — L570 Actinic keratosis: Secondary | ICD-10-CM | POA: Diagnosis not present

## 2016-06-05 DIAGNOSIS — L812 Freckles: Secondary | ICD-10-CM | POA: Diagnosis not present

## 2016-06-05 DIAGNOSIS — L821 Other seborrheic keratosis: Secondary | ICD-10-CM | POA: Diagnosis not present

## 2016-06-05 DIAGNOSIS — D1801 Hemangioma of skin and subcutaneous tissue: Secondary | ICD-10-CM | POA: Diagnosis not present

## 2016-06-05 DIAGNOSIS — D225 Melanocytic nevi of trunk: Secondary | ICD-10-CM | POA: Diagnosis not present

## 2016-06-15 DIAGNOSIS — M65811 Other synovitis and tenosynovitis, right shoulder: Secondary | ICD-10-CM | POA: Diagnosis not present

## 2016-06-15 DIAGNOSIS — M659 Synovitis and tenosynovitis, unspecified: Secondary | ICD-10-CM | POA: Diagnosis not present

## 2016-06-15 DIAGNOSIS — M7541 Impingement syndrome of right shoulder: Secondary | ICD-10-CM | POA: Diagnosis not present

## 2016-06-15 DIAGNOSIS — I10 Essential (primary) hypertension: Secondary | ICD-10-CM | POA: Diagnosis not present

## 2016-06-15 DIAGNOSIS — M75121 Complete rotator cuff tear or rupture of right shoulder, not specified as traumatic: Secondary | ICD-10-CM | POA: Diagnosis not present

## 2016-06-15 DIAGNOSIS — G8918 Other acute postprocedural pain: Secondary | ICD-10-CM | POA: Diagnosis not present

## 2016-06-15 DIAGNOSIS — M75101 Unspecified rotator cuff tear or rupture of right shoulder, not specified as traumatic: Secondary | ICD-10-CM | POA: Diagnosis not present

## 2016-06-15 DIAGNOSIS — E785 Hyperlipidemia, unspecified: Secondary | ICD-10-CM | POA: Diagnosis not present

## 2016-06-15 DIAGNOSIS — M7521 Bicipital tendinitis, right shoulder: Secondary | ICD-10-CM | POA: Diagnosis not present

## 2016-06-15 HISTORY — PX: SHOULDER ARTHROSCOPY W/ ROTATOR CUFF REPAIR: SHX2400

## 2016-06-21 DIAGNOSIS — M25611 Stiffness of right shoulder, not elsewhere classified: Secondary | ICD-10-CM | POA: Diagnosis not present

## 2016-06-21 DIAGNOSIS — M75121 Complete rotator cuff tear or rupture of right shoulder, not specified as traumatic: Secondary | ICD-10-CM | POA: Diagnosis not present

## 2016-06-21 DIAGNOSIS — R29898 Other symptoms and signs involving the musculoskeletal system: Secondary | ICD-10-CM | POA: Diagnosis not present

## 2016-06-26 DIAGNOSIS — M75121 Complete rotator cuff tear or rupture of right shoulder, not specified as traumatic: Secondary | ICD-10-CM | POA: Diagnosis not present

## 2016-06-26 DIAGNOSIS — M25611 Stiffness of right shoulder, not elsewhere classified: Secondary | ICD-10-CM | POA: Diagnosis not present

## 2016-06-26 DIAGNOSIS — R29898 Other symptoms and signs involving the musculoskeletal system: Secondary | ICD-10-CM | POA: Diagnosis not present

## 2016-06-28 DIAGNOSIS — Z9889 Other specified postprocedural states: Secondary | ICD-10-CM | POA: Diagnosis not present

## 2016-06-28 DIAGNOSIS — M75121 Complete rotator cuff tear or rupture of right shoulder, not specified as traumatic: Secondary | ICD-10-CM | POA: Diagnosis not present

## 2016-07-05 DIAGNOSIS — M25611 Stiffness of right shoulder, not elsewhere classified: Secondary | ICD-10-CM | POA: Diagnosis not present

## 2016-07-05 DIAGNOSIS — R29898 Other symptoms and signs involving the musculoskeletal system: Secondary | ICD-10-CM | POA: Diagnosis not present

## 2016-07-05 DIAGNOSIS — M75121 Complete rotator cuff tear or rupture of right shoulder, not specified as traumatic: Secondary | ICD-10-CM | POA: Diagnosis not present

## 2016-07-10 DIAGNOSIS — M75121 Complete rotator cuff tear or rupture of right shoulder, not specified as traumatic: Secondary | ICD-10-CM | POA: Diagnosis not present

## 2016-07-10 DIAGNOSIS — M25611 Stiffness of right shoulder, not elsewhere classified: Secondary | ICD-10-CM | POA: Diagnosis not present

## 2016-07-10 DIAGNOSIS — R29898 Other symptoms and signs involving the musculoskeletal system: Secondary | ICD-10-CM | POA: Diagnosis not present

## 2016-07-17 DIAGNOSIS — R29898 Other symptoms and signs involving the musculoskeletal system: Secondary | ICD-10-CM | POA: Diagnosis not present

## 2016-07-17 DIAGNOSIS — M25611 Stiffness of right shoulder, not elsewhere classified: Secondary | ICD-10-CM | POA: Diagnosis not present

## 2016-07-17 DIAGNOSIS — M75121 Complete rotator cuff tear or rupture of right shoulder, not specified as traumatic: Secondary | ICD-10-CM | POA: Diagnosis not present

## 2016-07-24 DIAGNOSIS — M25611 Stiffness of right shoulder, not elsewhere classified: Secondary | ICD-10-CM | POA: Diagnosis not present

## 2016-07-24 DIAGNOSIS — R29898 Other symptoms and signs involving the musculoskeletal system: Secondary | ICD-10-CM | POA: Diagnosis not present

## 2016-07-24 DIAGNOSIS — M75121 Complete rotator cuff tear or rupture of right shoulder, not specified as traumatic: Secondary | ICD-10-CM | POA: Diagnosis not present

## 2016-07-25 ENCOUNTER — Encounter: Payer: Self-pay | Admitting: Internal Medicine

## 2016-07-31 DIAGNOSIS — R29898 Other symptoms and signs involving the musculoskeletal system: Secondary | ICD-10-CM | POA: Diagnosis not present

## 2016-07-31 DIAGNOSIS — M25611 Stiffness of right shoulder, not elsewhere classified: Secondary | ICD-10-CM | POA: Diagnosis not present

## 2016-07-31 DIAGNOSIS — M75121 Complete rotator cuff tear or rupture of right shoulder, not specified as traumatic: Secondary | ICD-10-CM | POA: Diagnosis not present

## 2016-08-21 DIAGNOSIS — R29898 Other symptoms and signs involving the musculoskeletal system: Secondary | ICD-10-CM | POA: Diagnosis not present

## 2016-08-21 DIAGNOSIS — M75121 Complete rotator cuff tear or rupture of right shoulder, not specified as traumatic: Secondary | ICD-10-CM | POA: Diagnosis not present

## 2016-08-21 DIAGNOSIS — M25611 Stiffness of right shoulder, not elsewhere classified: Secondary | ICD-10-CM | POA: Diagnosis not present

## 2016-12-27 ENCOUNTER — Other Ambulatory Visit: Payer: Self-pay | Admitting: Internal Medicine

## 2016-12-27 DIAGNOSIS — Q791 Other congenital malformations of diaphragm: Secondary | ICD-10-CM

## 2016-12-27 DIAGNOSIS — I10 Essential (primary) hypertension: Secondary | ICD-10-CM | POA: Diagnosis not present

## 2016-12-27 DIAGNOSIS — Z6828 Body mass index (BMI) 28.0-28.9, adult: Secondary | ICD-10-CM | POA: Diagnosis not present

## 2016-12-27 DIAGNOSIS — R0600 Dyspnea, unspecified: Secondary | ICD-10-CM

## 2017-01-02 ENCOUNTER — Ambulatory Visit
Admission: RE | Admit: 2017-01-02 | Discharge: 2017-01-02 | Disposition: A | Discharge status: Home / Self Care | Payer: Medicare Other | Source: Ambulatory Visit | Attending: Internal Medicine | Admitting: Internal Medicine

## 2017-01-02 DIAGNOSIS — Z9889 Other specified postprocedural states: Secondary | ICD-10-CM | POA: Diagnosis not present

## 2017-01-02 DIAGNOSIS — Z7189 Other specified counseling: Secondary | ICD-10-CM | POA: Diagnosis not present

## 2017-01-02 DIAGNOSIS — R0600 Dyspnea, unspecified: Secondary | ICD-10-CM

## 2017-01-02 DIAGNOSIS — R918 Other nonspecific abnormal finding of lung field: Secondary | ICD-10-CM | POA: Diagnosis not present

## 2017-01-02 DIAGNOSIS — M75121 Complete rotator cuff tear or rupture of right shoulder, not specified as traumatic: Secondary | ICD-10-CM | POA: Diagnosis not present

## 2017-01-02 DIAGNOSIS — Q791 Other congenital malformations of diaphragm: Secondary | ICD-10-CM

## 2017-01-02 IMAGING — CT CT CHEST W/ CM
2 of 4 series · 15 of 36 positions shown, 18 images · IV contrast (iopamidol)
Comparison: None.

CLINICAL DATA: Difficulty breathing

EXAM:
CT CHEST WITH CONTRAST
TECHNIQUE: Multidetector CT imaging of the chest was performed during
intravenous contrast administration.
CONTRAST:  75mL [NT] IOPAMIDOL ([NT]) INJECTION 61%

[Series 2: chest w/cm · axial · 0.77mm/px · z∈[-10,+270]mm · 12 of 166 slices shown, 15 images]
[im 13/166  mediastinal]
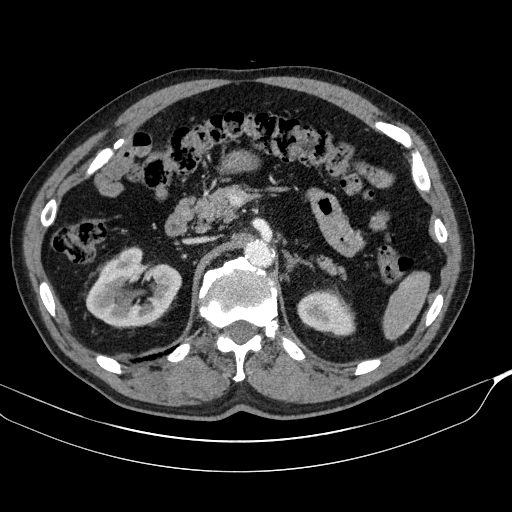
[im 13/166  lung]
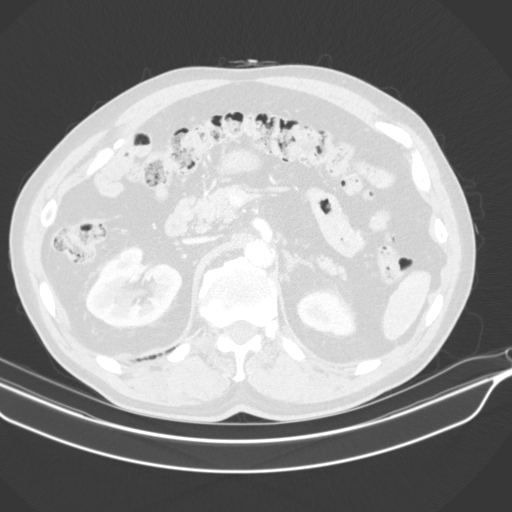
[im 26/166  lung]
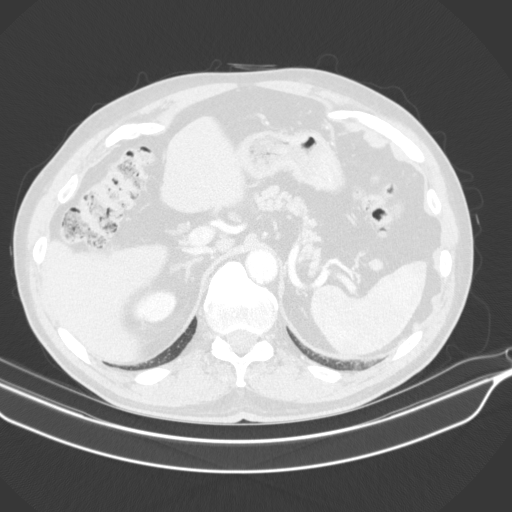
[im 39/166  lung]
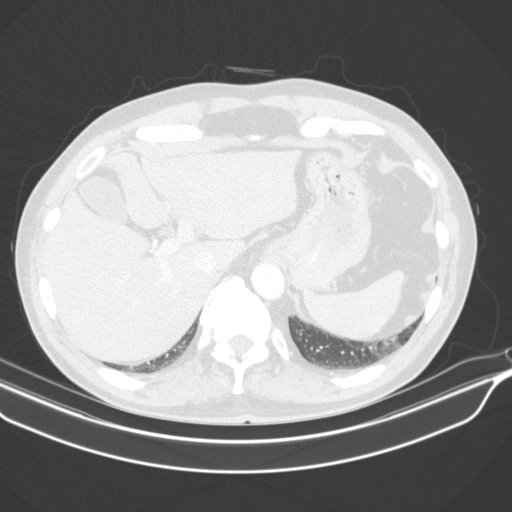
[im 51/166  lung]
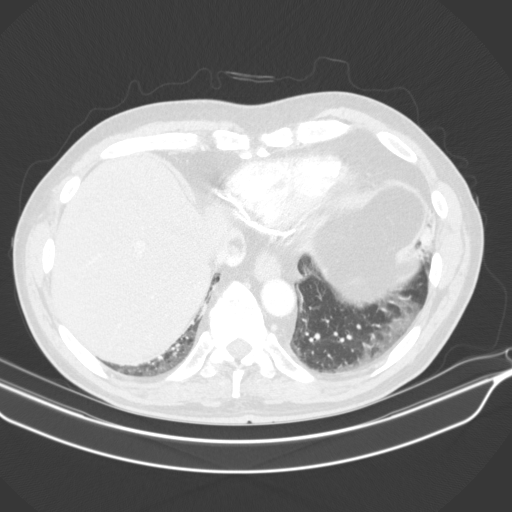
[im 64/166  mediastinal]
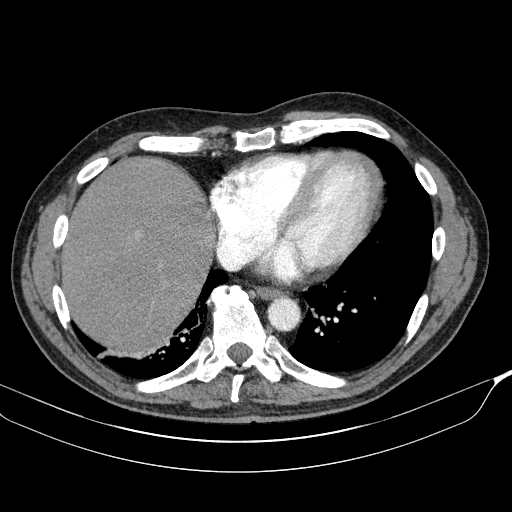
[im 64/166  lung]
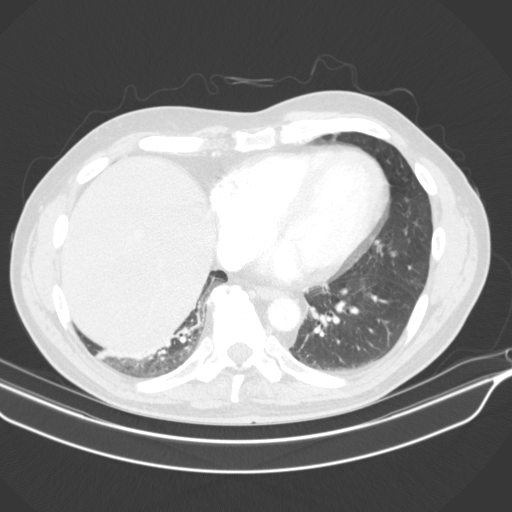
[im 77/166  lung]
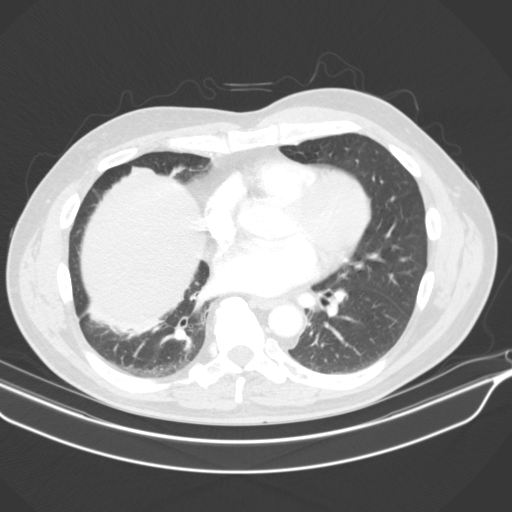
[im 89/166  lung]
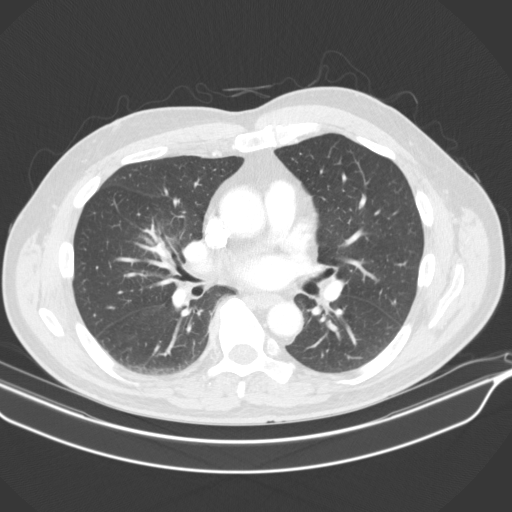
[im 102/166  lung]
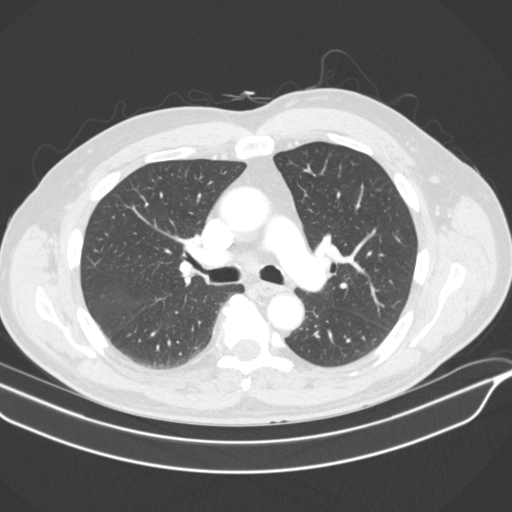
[im 115/166  mediastinal]
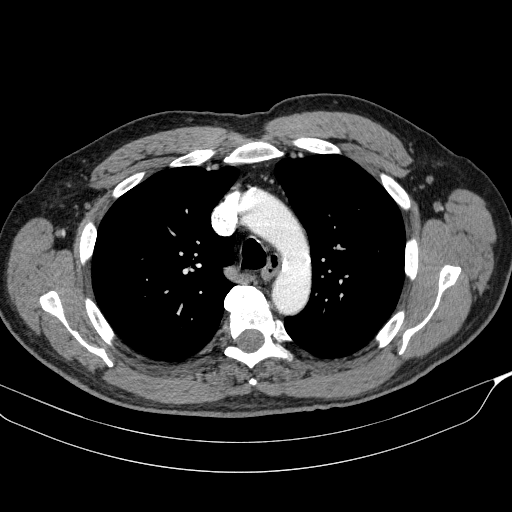
[im 115/166  lung]
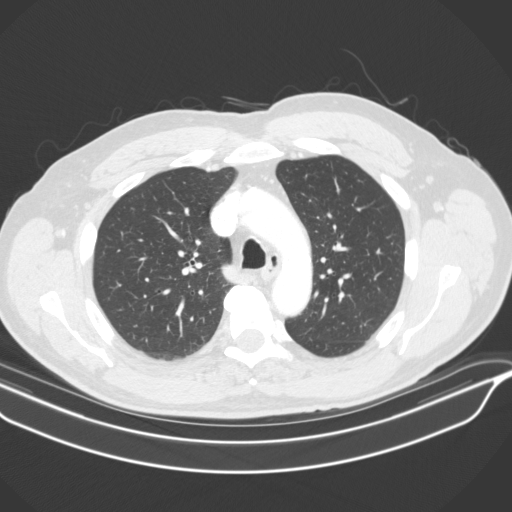
[im 127/166  lung]
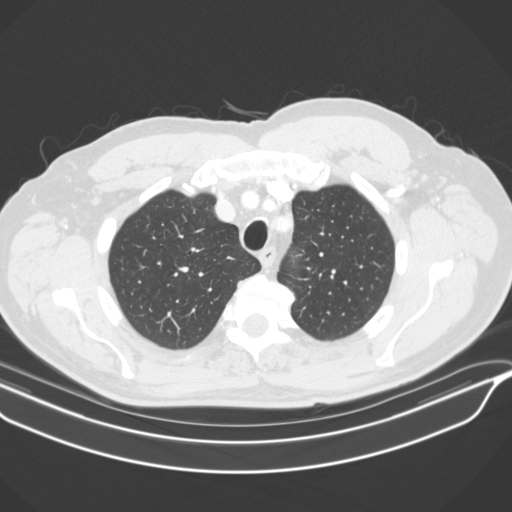
[im 140/166  lung]
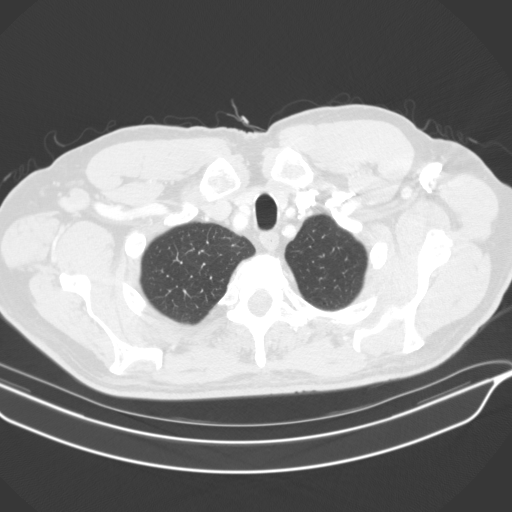
[im 153/166  lung]
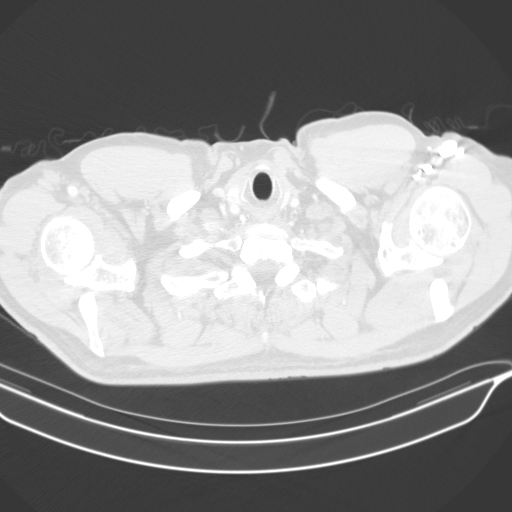

[Series 3: cor · coronal · 0.65mm/px · 3 of 136 slices shown]
[im 28/136  lung]
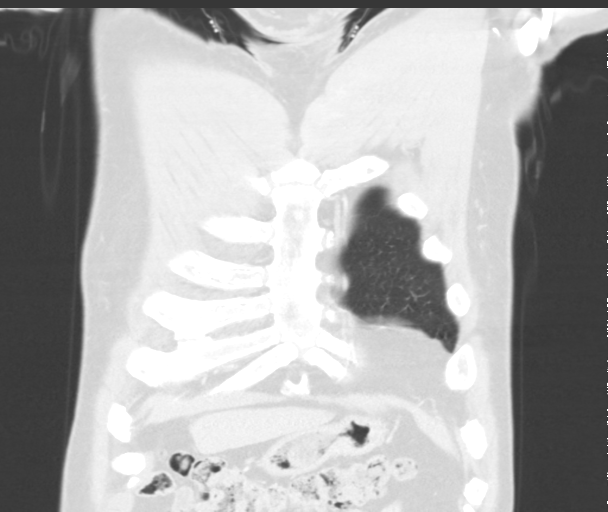
[im 55/136  lung]
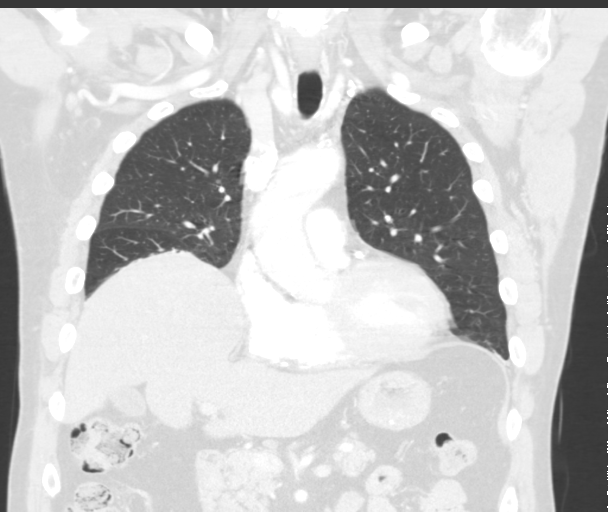
[im 82/136  lung]
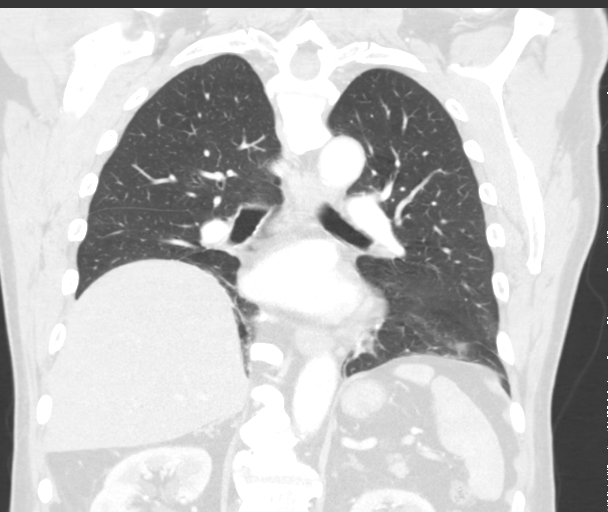

[15 of 36 positions shown; findings below may reference images not displayed]

FINDINGS: Cardiovascular: There is no thoracic aortic aneurysm or dissection.
There is calcification at the origin of the left subclavian artery.
There is mild calcification in the proximal right common carotid
artery. Visualized great vessels otherwise appear normal. There are
scattered foci of atherosclerotic calcification in the aorta. There
is extensive coronary artery calcification. Pericardium is not
appreciably thickened.

Mediastinum/Nodes: Thyroid appears unremarkable. There is a lymph
node in the midline at the level of the carina measuring 1.6 x
cm. There is no other lymph node prominence. There is a small hiatal
hernia.

Lungs/Pleura: There is patchy bibasilar atelectatic change with
probable associated scarring. There is mild lower lobe
bronchiectatic change bilaterally. There is no frank edema or
consolidation. No pleural effusion or pleural thickening evident.

Upper Abdomen: There is aortic atherosclerosis. There is incomplete
visualization of apparent parapelvic cysts bilaterally. Visualized
upper abdominal structures otherwise appear unremarkable.

Musculoskeletal: There is degenerative change in the thoracic spine
with diffuse idiopathic skeletal hyperostosis appearing head there
are no blastic or lytic bone lesions.
IMPRESSION: Atelectasis and scarring in the lung bases. No frank edema or
consolidation. No pulmonary nodular lesion. There is mild lower lobe
bronchiectatic change.

Single enlarged lymph node at the level of the carina. Etiology
uncertain. Given changes in the lung bases, this single prominent
lymph node may have reactive etiology.

Areas of atherosclerotic calcification and great vessels and aorta.
There is extensive coronary artery calcification.

There is a small hiatal hernia.

## 2017-01-02 MED ORDER — IOPAMIDOL (ISOVUE-300) INJECTION 61%
75.0000 mL | Freq: Once | INTRAVENOUS | Status: AC | PRN
  Administered 2017-01-02: 75 mL via INTRAVENOUS

## 2017-01-18 HISTORY — PX: NM MYOVIEW LTD: HXRAD82

## 2017-01-22 ENCOUNTER — Telehealth: Payer: Self-pay | Admitting: Cardiology

## 2017-01-22 DIAGNOSIS — E663 Overweight: Secondary | ICD-10-CM | POA: Diagnosis not present

## 2017-01-22 DIAGNOSIS — I10 Essential (primary) hypertension: Secondary | ICD-10-CM | POA: Diagnosis not present

## 2017-01-22 DIAGNOSIS — R7301 Impaired fasting glucose: Secondary | ICD-10-CM | POA: Diagnosis not present

## 2017-01-22 DIAGNOSIS — R3129 Other microscopic hematuria: Secondary | ICD-10-CM | POA: Diagnosis not present

## 2017-01-22 DIAGNOSIS — R0609 Other forms of dyspnea: Secondary | ICD-10-CM | POA: Diagnosis not present

## 2017-01-22 DIAGNOSIS — Z1389 Encounter for screening for other disorder: Secondary | ICD-10-CM | POA: Diagnosis not present

## 2017-01-22 DIAGNOSIS — E784 Other hyperlipidemia: Secondary | ICD-10-CM | POA: Diagnosis not present

## 2017-01-22 DIAGNOSIS — H8103 Meniere's disease, bilateral: Secondary | ICD-10-CM | POA: Diagnosis not present

## 2017-01-22 DIAGNOSIS — Z125 Encounter for screening for malignant neoplasm of prostate: Secondary | ICD-10-CM | POA: Diagnosis not present

## 2017-01-22 DIAGNOSIS — Z0000 Encounter for general adult medical examination without abnormal findings: Secondary | ICD-10-CM | POA: Diagnosis not present

## 2017-01-22 DIAGNOSIS — Z6828 Body mass index (BMI) 28.0-28.9, adult: Secondary | ICD-10-CM | POA: Diagnosis not present

## 2017-01-22 DIAGNOSIS — Q791 Other congenital malformations of diaphragm: Secondary | ICD-10-CM | POA: Diagnosis not present

## 2017-01-22 DIAGNOSIS — I6529 Occlusion and stenosis of unspecified carotid artery: Secondary | ICD-10-CM | POA: Diagnosis not present

## 2017-01-22 DIAGNOSIS — K0510 Chronic gingivitis, plaque induced: Secondary | ICD-10-CM | POA: Diagnosis not present

## 2017-01-22 NOTE — Telephone Encounter (Signed)
Received records from [facility] for appointment on 01/28/17 with Dr Ellyn Hack.  Records put with Dr Allison Quarry schedule for 01/28/17.

## 2017-01-28 ENCOUNTER — Ambulatory Visit (INDEPENDENT_AMBULATORY_CARE_PROVIDER_SITE_OTHER): Payer: Medicare Other | Admitting: Cardiology

## 2017-01-28 ENCOUNTER — Encounter: Payer: Self-pay | Admitting: Cardiology

## 2017-01-28 VITALS — BP 124/62 | HR 61 | Ht 67.0 in | Wt 185.0 lb

## 2017-01-28 DIAGNOSIS — R079 Chest pain, unspecified: Secondary | ICD-10-CM | POA: Diagnosis not present

## 2017-01-28 DIAGNOSIS — I10 Essential (primary) hypertension: Secondary | ICD-10-CM | POA: Diagnosis not present

## 2017-01-28 DIAGNOSIS — R0600 Dyspnea, unspecified: Secondary | ICD-10-CM

## 2017-01-28 DIAGNOSIS — R0609 Other forms of dyspnea: Secondary | ICD-10-CM

## 2017-01-28 DIAGNOSIS — E7849 Other hyperlipidemia: Secondary | ICD-10-CM

## 2017-01-28 DIAGNOSIS — E784 Other hyperlipidemia: Secondary | ICD-10-CM

## 2017-01-28 HISTORY — DX: Dyspnea, unspecified: R06.00

## 2017-01-28 HISTORY — DX: Other forms of dyspnea: R06.09

## 2017-01-28 NOTE — Progress Notes (Signed)
PCP: Crist Infante, MD  Clinic Note: Chief Complaint  Patient presents with  . New Evaluation    Dyspnea    HPI: Jordan [name] is a 76 y.o. male who is being seen today for the evaluation of Exertional dyspnea at the request of Crist Infante, MD. Jordan [name] had seen Dr. Rockey Situ intermittently as late as August 2015, but has not followed up since then. He had mild bilateral carotid disease and was referred back to his PCP. The plan was to keep LDL less than 100 for prefer closer 70.  Jordan [name] was last seen on 01/22/2017 by Dr. Joylene Draft for routine annual follow-up. He noted that his "breathing is not as good as it used to be". Most notable dyspnea is when going uphill or exerting heart.  Recent Hospitalizations: None  Studies Personally Reviewed - (if available, images/films reviewed: From Epic Chart or Care Everywhere)  No available studies  Interval History: Jordan [name] presents here today for evaluation of what seems to be progressively worsening exertional dyspnea  shortness of breath with rest or exertion. 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 Up until this past winter, he was doing fine without any issues. He does significant bike riding about the course of the summer which she is about ready to get back into the beach. Otherwise he is very active on the farm and doing calisthenics exercises. On his 76th birthday he did 20 pushups. What is noted though is he will get more short of breath and tired when doing less activity now. Started off just being when he was exerting himself heavily, carrying something or going up a hill fast. Now shortness of breath is more notable with less activity. He is hoping to get back into his routine bicycle regimen down at the beach, and is scared to do so before being tested.   He doesn't really notice any squeezing or tightness in his chest with rest or exertion, he just feels tired after doing  activity that would not usually make it feel tired. When he does have his occasional twinges in his chest that are very brief time and usually occur after he does some type of activity or exercise.  He denies any dyspnea or chest discomfort at rest either. No PND, orthopnea or edema.   No palpitations, lightheadedness, dizziness, weakness or syncope/near syncope. No TIA/amaurosis fugax symptoms.  No claudication.  ROS: A comprehensive was performed.Review of Systems  Constitutional: Negative for chills, fever and malaise/fatigue.  HENT: Negative for hearing loss and nosebleeds.   Eyes: Negative for blurred vision.  Gastrointestinal: Negative for abdominal pain, blood in stool, constipation, diarrhea and melena.  Genitourinary: Negative for frequency and hematuria.  Musculoskeletal: Negative for falls and myalgias.  Skin: Negative.        Scratches, excoriations and abrasions from working on the farm, but nothing that bothers him.  Neurological: Negative for dizziness, focal weakness and loss of consciousness.  Endo/Heme/Allergies: Negative for environmental allergies.  Psychiatric/Behavioral: Negative for depression and memory loss. The patient is not nervous/anxious and does not have insomnia.   All other systems reviewed and are negative.   I have reviewed and (if needed) personally updated the patient's problem list, medications, allergies, past medical and surgical history, social and family history.   Past Medical History:  Diagnosis Date  . DDD (degenerative disc disease)   . ED (erectile dysfunction)   . GERD (gastroesophageal reflux disease)   .  HTN (hypertension)   . Hyperlipidemia   . Impaired fasting glucose   . Kidney stones   . Knee pain    bilateral  . Meniere's disease   . Meniere's vertigo   . Mild carotid artery disease ([place])   . Ocular migraine   . Shoulder pain   . Wears hearing aid     Past Surgical History:  Procedure Laterality Date  . Cardiac Stress  Test  2008   Normal  . CARPAL TUNNEL RELEASE Left 1998  . CARPAL TUNNEL RELEASE Right 2008  . HIATAL HERNIA REPAIR    . KNEE CARTILAGE SURGERY Bilateral   . MEDIAL PARTIAL KNEE REPLACEMENT Right 2010   Partial right knee replacement  . ROTATOR CUFF REPAIR  2008  . TONSILLECTOMY      Current Meds  Medication Sig  . Calcium Citrate-Vitamin D (CITRACAL MAXIMUM) 315-250 MG-UNIT TABS Take 2 tablets by mouth daily.  . cholecalciferol (VITAMIN D) 1000 UNITS tablet Take 4,000 Units by mouth daily.   . Cyanocobalamin (VITAMIN B-12 PO) Take 1 tablet by mouth daily.  Marland Kitchen esomeprazole (NEXIUM) 40 MG capsule Take 40 mg by mouth daily before breakfast.    . ezetimibe (ZETIA) 10 MG tablet Take 10 mg by mouth daily.  . Multiple Vitamins-Minerals (CENTRUM SILVER PO) Take 1 tablet by mouth daily.  . Omega-3 Fatty Acids (FISH OIL) 1000 MG CAPS Take 2 capsules by mouth daily.   . ranitidine (ZANTAC) 150 MG tablet Take 150 mg by mouth 2 (two) times daily.  . tadalafil (CIALIS) 20 MG tablet Take 10 mg by mouth daily as needed.    . valsartan (DIOVAN) 80 MG tablet Take 80 mg by mouth daily.    Allergies  Allergen Reactions  . Crestor [Rosuvastatin Calcium] Other (See Comments)    Memory issues    Social History   Social History  . Marital status: Unknown    Spouse name: N/A  . Number of children: 2  . Years of education: N/A   Occupational History  . retired    Social History Main Topics  . Smoking status: Never Smoker  . Smokeless tobacco: Never Used  . Alcohol use 9.6 oz/week    14 Glasses of wine, 2 Shots of liquor per week  . Drug use: No  . Sexual activity: Yes   Other Topics Concern  . None   Social History Narrative   Jordan [name] is a very pleasant married gentleman with 2 children [name] and [place]) each with 3 children/total 6 grandchildren. He is currently retired now from his original job in [employer], however he still serves on [employer] at [employer] and recently stepped down  from [employer] at [employer].   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    For the last several months he has been off caffeine and wine because of GI upset symptoms and palpitations. Also trying to lose weight.     He used to drink maybe 2 glasses of wine at night, and he is subsequently all quit alcohol.   He is relatively active with exercise but not with routine pattern for standard routine. He does push AND other calisthenics as well as kayaking and bike riding.     family history includes Cancer in his mother; Coronary artery disease in his father; Heart attack in his father; Heart disease in his father; Heart failure in his mother; Hyperlipidemia in his brother, father, and  son; Hypertension in his brother.  Wt Readings from Last 3 Encounters:  01/28/17 185 lb (83.9 kg)  03/31/14 182 lb 12 oz (82.9 kg)  06/23/13 184 lb (83.5 kg)    PHYSICAL EXAM BP 124/62   Pulse 61   Ht 5\' 7"  (1.702 m)   Wt 185 lb (83.9 kg)   BMI 28.98 kg/m  General appearance: alert, cooperative, appears stated age, no distress. Overweight, not obese . Well-nourished and well-groomed. Male pattern baldness HEENT: [place]/AT, EOMI, MMM, anicteric sclera Neck: no adenopathy, no carotid bruit and no JVD Lungs: clear to auscultation bilaterally, normal percussion bilaterally and non-labored Heart: regular rate and rhythm, S1 &S2 normal, no murmur, click, rub or gallop; nondisplaced PMI Abdomen: soft, non-tender; bowel sounds normal; no masses,  no organomegaly; no HJR Extremities: extremities normal, atraumatic, no cyanosis, or edema  Pulses: 2+ and symmetric;  Skin: mobility and turgor normal, no edema, no evidence of bleeding or bruising, temperature normal and texture normal Neurologic: Mental status: Alert & oriented x 3, thought content appropriate; non-focal exam.  Pleasant mood & affect. Cranial nerves: normal (II-XII grossly intact)    Adult  ECG Report  Rate: 61 ;  Rhythm: normal sinus rhythm and Left axis deviation (-36). Otherwise normal durations, intervals and voltage;   Narrative Interpretation: Essentially normal EKG - no notable change from PCPs office.   Other studies Reviewed: Additional studies/ records that were reviewed today include:  Recent Labs:  See full scanned report. Pertinent labs include: Potassium 5.0, BUN/creatinine 23/0.9. H/H 16.5/49.5. Total cholesterol 217, TG 124, HDL 53, LDL 139 Chest x-ray from PCP March 2015: Elevated right hemidiaphragm. No other interval change.   ASSESSMENT / PLAN: Problem List Items Addressed This Visit    Chest pain with moderate risk for cardiac etiology    He had atypical chest pain the past, and I agree that that slight episodes is noticing now are probably also atypical, however the setting of progressively worsening exertional dyspnea, chest discomfort is somewhat concerning for possible angina. Therefore at least moderate risk given his cardiac risk factors age, male, hypertension and hyperlipidemia as well as borderline glucose levels. Plan: Treadmill Exercise Myoview Nuclear Stress Test      Relevant Orders   EKG 12-Lead   Myocardial Perfusion Imaging   Essential hypertension (Chronic)    Relatively well-controlled here today. On low-dose valsartan only.      Relevant Medications   valsartan (DIOVAN) 80 MG tablet   ezetimibe (ZETIA) 10 MG tablet   Exertional dyspnea - Primary    Most notable symptom being exertional dyspnea the patient who is relatively active and usually does not have this symptom. His relatively new onset for him. I'm concerned with the level of decreased exercise tolerance is having with hyperlipidemia and hypertension given his age. Plan: Treadmill Exercise Myoview Nuclear Stress Test - evaluate for ischemia as well as EF. May need to consider echo if any wall motion abnormalities are found.      Relevant Orders   EKG 12-Lead    Myocardial Perfusion Imaging   Hyperlipidemia (Chronic)    Goal for cholesterol was keep his LDL between 70-100. He is not at goal now, but has restarted Zetia after having stopped Crestor due to memory issues. These are being followed also by Dr. Haynes Kerns, however pending stress test results, we may need to become more vigilant with management.       Relevant Medications   valsartan (DIOVAN) 80 MG tablet   ezetimibe (ZETIA) 10  MG tablet   Other Relevant Orders   EKG 12-Lead      Current medicines are reviewed at length with the patient today. (+/- concerns) None The following changes have been made: None  Patient Instructions  No change with medication    Schedule at [address] Your physician has requested that you have en exercise stress myoview. For further information please visit [email or web address]. Please follow instruction sheet, as given.    Your physician recommends that you schedule a follow-up appointment in 1 month with Dr Ellyn Hack.    Studies Ordered:   Orders Placed This Encounter  Procedures  . Myocardial Perfusion Imaging  . EKG 12-Lead      Glenetta Hew, M.D., M.S. Interventional Cardiologist   Pager # [phone] Phone # [phone] [address] [place], [place] [address]

## 2017-01-28 NOTE — Patient Instructions (Addendum)
No change with medication    Schedule at [address] Your physician has requested that you have en exercise stress myoview. For further information please visit [email or web address]. Please follow instruction sheet, as given.    Your physician recommends that you schedule a follow-up appointment in 1 month with Dr Ellyn Hack.

## 2017-01-29 ENCOUNTER — Encounter: Payer: Self-pay | Admitting: Cardiology

## 2017-01-29 DIAGNOSIS — I10 Essential (primary) hypertension: Secondary | ICD-10-CM | POA: Insufficient documentation

## 2017-01-29 HISTORY — DX: Essential (primary) hypertension: I10

## 2017-01-29 NOTE — Assessment & Plan Note (Signed)
Relatively well-controlled here today. On low-dose valsartan only.

## 2017-01-29 NOTE — Assessment & Plan Note (Addendum)
Goal for cholesterol was keep his LDL between 70-100. He is not at goal now, but has restarted Zetia after having stopped Crestor due to memory issues. These are being followed also by Dr. Haynes Kerns, however pending stress test results, we may need to become more vigilant with management.

## 2017-01-29 NOTE — Assessment & Plan Note (Addendum)
Most notable symptom being exertional dyspnea the patient who is relatively active and usually does not have this symptom. His relatively new onset for him. I'm concerned with the level of decreased exercise tolerance is having with hyperlipidemia and hypertension given his age. Plan: Treadmill Exercise Myoview Nuclear Stress Test - evaluate for ischemia as well as EF. May need to consider echo if any wall motion abnormalities are found.

## 2017-01-29 NOTE — Assessment & Plan Note (Signed)
He had atypical chest pain the past, and I agree that that slight episodes is noticing now are probably also atypical, however the setting of progressively worsening exertional dyspnea, chest discomfort is somewhat concerning for possible angina. Therefore at least moderate risk given his cardiac risk factors age, male, hypertension and hyperlipidemia as well as borderline glucose levels. Plan: Treadmill Exercise Myoview Nuclear Stress Test

## 2017-02-05 ENCOUNTER — Telehealth (HOSPITAL_COMMUNITY): Payer: Self-pay

## 2017-02-05 NOTE — Telephone Encounter (Signed)
Encounter complete. 

## 2017-02-12 ENCOUNTER — Ambulatory Visit (HOSPITAL_COMMUNITY)
Admission: RE | Admit: 2017-02-12 | Discharge: 2017-02-12 | Disposition: A | Discharge status: Home / Self Care | Payer: Medicare Other | Source: Ambulatory Visit | Attending: Cardiovascular Disease | Admitting: Cardiovascular Disease

## 2017-02-12 DIAGNOSIS — R0609 Other forms of dyspnea: Secondary | ICD-10-CM

## 2017-02-12 DIAGNOSIS — R9439 Abnormal result of other cardiovascular function study: Secondary | ICD-10-CM | POA: Diagnosis not present

## 2017-02-12 DIAGNOSIS — R079 Chest pain, unspecified: Secondary | ICD-10-CM | POA: Diagnosis not present

## 2017-02-12 LAB — MYOCARDIAL PERFUSION IMAGING
CHL CUP NUCLEAR SDS: 4
CHL CUP NUCLEAR SRS: 0
CHL CUP NUCLEAR SSS: 4
CSEPEDS: 30 s
CSEPEW: 11.9 METS
Exercise duration (min): 10 min
LVDIAVOL: 114 mL (ref 62–150)
LVSYSVOL: 50 mL
MPHR: 146 {beats}/min
NUC STRESS TID: 0.9
Peak HR: 139 {beats}/min
Percent HR: 95 %
RPE: 18
Rest HR: 54 {beats}/min

## 2017-02-12 MED ORDER — TECHNETIUM TC 99M TETROFOSMIN IV KIT
10.9000 | PACK | Freq: Once | INTRAVENOUS | Status: AC | PRN
Start: 2017-02-12 — End: 2017-02-12
  Administered 2017-02-12: 10.9 via INTRAVENOUS
  Filled 2017-02-12: qty 11

## 2017-02-12 MED ORDER — TECHNETIUM TC 99M TETROFOSMIN IV KIT
31.9000 | PACK | Freq: Once | INTRAVENOUS | Status: AC | PRN
  Administered 2017-02-12: 31.9 via INTRAVENOUS
  Filled 2017-02-12: qty 32

## 2017-02-14 DIAGNOSIS — M545 Low back pain: Secondary | ICD-10-CM | POA: Diagnosis not present

## 2017-02-14 DIAGNOSIS — M461 Sacroiliitis, not elsewhere classified: Secondary | ICD-10-CM | POA: Diagnosis not present

## 2017-02-14 DIAGNOSIS — M9903 Segmental and somatic dysfunction of lumbar region: Secondary | ICD-10-CM | POA: Diagnosis not present

## 2017-02-14 DIAGNOSIS — M9904 Segmental and somatic dysfunction of sacral region: Secondary | ICD-10-CM | POA: Diagnosis not present

## 2017-02-25 ENCOUNTER — Institutional Professional Consult (permissible substitution): Payer: Medicare Other | Admitting: Internal Medicine

## 2017-02-26 ENCOUNTER — Encounter: Payer: Self-pay | Admitting: Internal Medicine

## 2017-02-26 ENCOUNTER — Ambulatory Visit (INDEPENDENT_AMBULATORY_CARE_PROVIDER_SITE_OTHER): Payer: Medicare Other | Admitting: Internal Medicine

## 2017-02-26 ENCOUNTER — Telehealth: Payer: Self-pay | Admitting: Adult Health

## 2017-02-26 VITALS — BP 132/70 | HR 72 | Ht 67.0 in | Wt 184.0 lb

## 2017-02-26 DIAGNOSIS — Z87898 Personal history of other specified conditions: Secondary | ICD-10-CM | POA: Insufficient documentation

## 2017-02-26 DIAGNOSIS — R0989 Other specified symptoms and signs involving the circulatory and respiratory systems: Secondary | ICD-10-CM | POA: Insufficient documentation

## 2017-02-26 DIAGNOSIS — R590 Localized enlarged lymph nodes: Secondary | ICD-10-CM | POA: Diagnosis not present

## 2017-02-26 DIAGNOSIS — R0609 Other forms of dyspnea: Secondary | ICD-10-CM

## 2017-02-26 HISTORY — DX: Personal history of other specified conditions: Z87.898

## 2017-02-26 HISTORY — DX: Localized enlarged lymph nodes: R59.0

## 2017-02-26 HISTORY — DX: Other specified symptoms and signs involving the circulatory and respiratory systems: R09.89

## 2017-02-26 LAB — NITRIC OXIDE: NITRIC OXIDE: 28

## 2017-02-26 NOTE — Patient Instructions (Addendum)
ICD-10-CM   1. Dyspnea on exertion R06.09 Nitric oxide    Pulmonary function test    CT CHEST HIGH RESOLUTION  2. Bibasilar crackles R09.89 CT CHEST HIGH RESOLUTION  3. History of wheezing Z87.898 CT CHEST HIGH RESOLUTION  4. Mediastinal adenopathy R59.0    No clear evidence of asthma Please go through with cardiac cath to rule out hear as reason for your symptoms Do HRCT chest - will call with results after July 23 Do PFT  Test -f ull PFT  Followup  return to see me or NP after test next few to several weeks Address subcarinal glad at fu- migh tneed PET scan

## 2017-02-26 NOTE — Progress Notes (Addendum)
Subjective:    Patient ID: Jordan [name], male    DOB: 1940/10/20, 77 y.o.   MRN: [id number]  PCP Crist Infante, MD  HPI    IOV 02/26/2017  Chief Complaint  Patient presents with  . Pulmonary Consult    dr. 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 He is extremely active.he tells me that he does rinse few times a week and does 150 pushups couple of times a week. 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 He has new onset insidious onset of wheezing on exertion relieved by rest. He does not notice this when he works as above activity. He denies his current symptom is dyspnea and said he insists that his wheezing. He notices it when he climbs an incline for 6-9 minutes. It is definitely relieved by rest. Definitely brought on by exertion. This no chest pain. He again denies its dyspnea. This no cough. It does not wake him up in the middle of the night. Does not have diaphoresis no edema or hemoptysis. Insists it is only wheezing. Is no weight loss.  Walking desaturation test on an 185 feet 3 laps on room air in our office. He walked extremely fast. Resting pulse ox was 94%. Final pulse ox was 92%. Resting heart rate was 67/m. Final heart rate was 88/m  Exhaled nitric oxide test in office today : feno 28ppb and normal- intermediate  He underwent CT scan of the chest without contrast 01/02/2017:  this is not a high-resolution CT chest showed bilateral lower lobe atelectasis with some groundglass opacities. There is a subcarinal node that is 1.6 cm in size.  He also has a small hiatal hernia. He underwent nuclear medicine cardiac stress test on 02/12/2017: Its intermediate stress test and a cardiac catheterization is planned     IMPRESSION: Atelectasis and scarring in the lung bases. No frank edema or consolidation. No  pulmonary nodular lesion. There is mild lower lobe bronchiectatic change.  Single enlarged lymph node at the level of the carina. Etiology uncertain. Given changes in the lung bases, this single prominent lymph node may have reactive etiology.  Areas of atherosclerotic calcification and great vessels and aorta. There is extensive coronary artery calcification.  There is a small hiatal hernia.   Electronically Signed   By: Lowella Grip III M.D.   On: 01/02/2017 09:36 Results for Jordan [name], Jordan [name] (MRN [id number]) as of 02/26/2017 15:13  Ref. 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   reports that he has never smoked. He has never used smokeless tobacco.  Past Surgical History:  Procedure Laterality Date  . Cardiac Stress Test  2008   Normal  . CARPAL TUNNEL RELEASE Left 1998  . CARPAL TUNNEL RELEASE Right 2008  . HIATAL HERNIA REPAIR    . KNEE CARTILAGE SURGERY Bilateral   . MEDIAL PARTIAL KNEE REPLACEMENT Right 2010   Partial right knee replacement  . ROTATOR CUFF REPAIR  2008  . TONSILLECTOMY      Allergies  Allergen Reactions  . Crestor [Rosuvastatin Calcium] Other (  See Comments)    Memory issues     There is no immunization history on file for this patient.  Family History  Problem Relation Age of Onset  . Heart failure Mother   . Cancer Mother        type unknown  . Coronary artery disease Father        CABG  . Heart attack Father        Long-term smoker.  Marland Kitchen Heart disease Father   . Hyperlipidemia Father   . Hyperlipidemia Brother   . Hypertension Brother   . Hyperlipidemia Son      Current  Outpatient Prescriptions:  .  Calcium Citrate-Vitamin D (CITRACAL MAXIMUM) 315-250 MG-UNIT TABS, Take 2 tablets by mouth daily., Disp: , Rfl:  .  cholecalciferol (VITAMIN D) 1000 UNITS tablet, Take 4,000 Units by mouth daily. , Disp: , Rfl:  .  Cyanocobalamin (VITAMIN B-12 PO), Take 1 tablet by mouth daily., Disp: , Rfl:  .  ezetimibe (ZETIA) 10 MG tablet, Take 10 mg by mouth daily., Disp: , Rfl:  .  Multiple Vitamins-Minerals (CENTRUM SILVER PO), Take 1 tablet by mouth daily., Disp: , Rfl:  .  Omega-3 Fatty Acids (FISH OIL) 1000 MG CAPS, Take 2 capsules by mouth daily. , Disp: , Rfl:  .  ranitidine (ZANTAC) 150 MG tablet, Take 150 mg by mouth 2 (two) times daily., Disp: , Rfl:  .  tadalafil (CIALIS) 20 MG tablet, Take 10 mg by mouth daily as needed.  , Disp: , Rfl:  .  valsartan (DIOVAN) 80 MG tablet, Take 80 mg by mouth daily., Disp: , Rfl:     Review of Systems  Constitutional: Negative for fever and unexpected weight change.  HENT: Negative for congestion, dental problem, ear pain, nosebleeds, postnasal drip, rhinorrhea, sinus pressure, sneezing, sore throat and trouble swallowing.   Eyes: Negative for redness and itching.  Respiratory: Positive for shortness of breath. Negative for cough, chest tightness and wheezing.   Cardiovascular: Negative for palpitations and leg swelling.  Gastrointestinal: Negative for nausea and vomiting.  Genitourinary: Negative for dysuria.  Musculoskeletal: Negative for joint swelling.  Skin: Negative for rash.  Neurological: Negative for headaches.  Hematological: Does not bruise/bleed easily.  Psychiatric/Behavioral: Negative for dysphoric mood. The patient is not nervous/anxious.        Objective:   Physical Exam  Constitutional: He is oriented to person, place, and time. He appears well-developed and well-nourished. No distress.  HENT:  Head: Normocephalic and atraumatic.  Right Ear: External ear normal.  Left Ear: External ear normal.    Mouth/Throat: Oropharynx is clear and moist. No oropharyngeal exudate.  Eyes: Conjunctivae and EOM are normal. Pupils are equal, round, and reactive to light. Right eye exhibits no discharge. Left eye exhibits no discharge. No scleral icterus.  Neck: Normal range of motion. Neck supple. No JVD present. No tracheal deviation present. No thyromegaly present.  Cardiovascular: Normal rate, regular rhythm and intact distal pulses.  Exam reveals no gallop and no friction rub.   No murmur heard. Pulmonary/Chest: Effort normal. No respiratory distress. He has no wheezes. He has rales. He exhibits no tenderness.  ? Crackles at bse  Abdominal: Soft. Bowel sounds are normal. He exhibits no distension and no mass. There is no tenderness. There is no rebound and no guarding.  Musculoskeletal: Normal range of motion. He exhibits no edema or tenderness.  Lymphadenopathy:    He has no cervical adenopathy.  Neurological: He is alert and oriented to person,  place, and time. He has normal reflexes. No cranial nerve deficit. Coordination normal.  Skin: Skin is warm and dry. No rash noted. He is not diaphoretic. No erythema. No pallor.  Psychiatric: He has a normal mood and affect. His behavior is normal. Judgment and thought content normal.  Nursing note and vitals reviewed.   Vitals:   02/26/17 1453  BP: 132/70  Pulse: 72  SpO2: 92%  Weight: 184 lb (83.5 kg)  Height: 5\' 7"  (1.702 m)    Estimated body mass index is 28.82 kg/m as calculated from the following:   Height as of this encounter: 5\' 7"  (1.702 m).   Weight as of this encounter: 184 lb (83.5 kg).        Assessment & Plan:     ICD-10-CM   1. Dyspnea on exertion R06.09 Nitric oxide    Pulmonary function test    CT CHEST HIGH RESOLUTION  2. Bibasilar crackles R09.89 CT CHEST HIGH RESOLUTION  3. History of wheezing Z87.898 CT CHEST HIGH RESOLUTION  4. Mediastinal adenopathy R59.0     No clear evidence of asthma Please go through with  cardiac cath to rule out hear as reason for your symptoms Do HRCT chest - will call with results after July 23 Do PFT  Test -f ull PFT  Followup  return to see me or NP after test next few to several weeks Address subcarinal glad at fu- migh tneed PET scan   Dr. Brand Males, M.D., [facility].C.P Pulmonary and Critical Care Medicine Staff Physician [facility] Pager: [phone], If no answer or between  15:00h - 7:00h: call [phone]  02/26/2017 3:46 PM

## 2017-02-26 NOTE — Addendum Note (Signed)
Addended by: Jannette Spanner on: 02/26/2017 03:52 PM   Modules accepted: Orders

## 2017-02-27 NOTE — Telephone Encounter (Signed)
EE please advise if this appt will be ok for the pt since MR doesn't have any appts open.  thanks

## 2017-03-01 NOTE — Telephone Encounter (Signed)
EE please advise. Thanks  

## 2017-03-01 NOTE — Telephone Encounter (Signed)
Will sign off on message as pt has been scheduled for appts.

## 2017-03-04 ENCOUNTER — Encounter: Payer: Self-pay | Admitting: Cardiology

## 2017-03-04 ENCOUNTER — Ambulatory Visit (INDEPENDENT_AMBULATORY_CARE_PROVIDER_SITE_OTHER): Payer: Medicare Other | Admitting: Cardiology

## 2017-03-04 ENCOUNTER — Ambulatory Visit (INDEPENDENT_AMBULATORY_CARE_PROVIDER_SITE_OTHER)
Admission: RE | Admit: 2017-03-04 | Discharge: 2017-03-04 | Disposition: A | Discharge status: Home / Self Care | Payer: Medicare Other | Source: Ambulatory Visit | Attending: Internal Medicine | Admitting: Internal Medicine

## 2017-03-04 ENCOUNTER — Institutional Professional Consult (permissible substitution): Payer: Medicare Other | Admitting: Internal Medicine

## 2017-03-04 VITALS — BP 120/68 | HR 69 | Ht 67.0 in | Wt 184.6 lb

## 2017-03-04 DIAGNOSIS — D689 Coagulation defect, unspecified: Secondary | ICD-10-CM

## 2017-03-04 DIAGNOSIS — R0609 Other forms of dyspnea: Secondary | ICD-10-CM

## 2017-03-04 DIAGNOSIS — R0989 Other specified symptoms and signs involving the circulatory and respiratory systems: Secondary | ICD-10-CM

## 2017-03-04 DIAGNOSIS — I10 Essential (primary) hypertension: Secondary | ICD-10-CM

## 2017-03-04 DIAGNOSIS — Z01818 Encounter for other preprocedural examination: Secondary | ICD-10-CM | POA: Diagnosis not present

## 2017-03-04 DIAGNOSIS — Z87898 Personal history of other specified conditions: Secondary | ICD-10-CM

## 2017-03-04 DIAGNOSIS — R9439 Abnormal result of other cardiovascular function study: Secondary | ICD-10-CM

## 2017-03-04 DIAGNOSIS — E7849 Other hyperlipidemia: Secondary | ICD-10-CM

## 2017-03-04 DIAGNOSIS — E784 Other hyperlipidemia: Secondary | ICD-10-CM | POA: Diagnosis not present

## 2017-03-04 DIAGNOSIS — R079 Chest pain, unspecified: Secondary | ICD-10-CM | POA: Diagnosis not present

## 2017-03-04 DIAGNOSIS — R0600 Dyspnea, unspecified: Secondary | ICD-10-CM | POA: Diagnosis not present

## 2017-03-04 IMAGING — CT CT CHEST HIGH RESOLUTION W/O CM
2 of 5 series · 15 of 36 positions shown, 18 images · non-contrast
Comparison: [DATE] chest CT.

CLINICAL DATA: Dyspnea on exertion for several months. Bibasilar
crackles. Wheezing.

EXAM:
CT CHEST WITHOUT CONTRAST
TECHNIQUE: Multidetector CT imaging of the chest was performed following the
standard protocol without intravenous contrast. High resolution
imaging of the lungs, as well as inspiratory and expiratory imaging,
was performed.

[Series 4: high resolution · axial · 0.78mm/px · z∈[+1142,+1446]mm · 12 of 168 slices shown, 15 images]
[im 8/168  mediastinal]
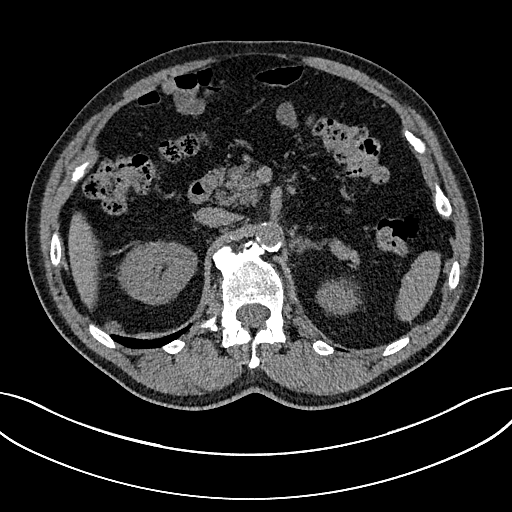
[im 8/168  lung]
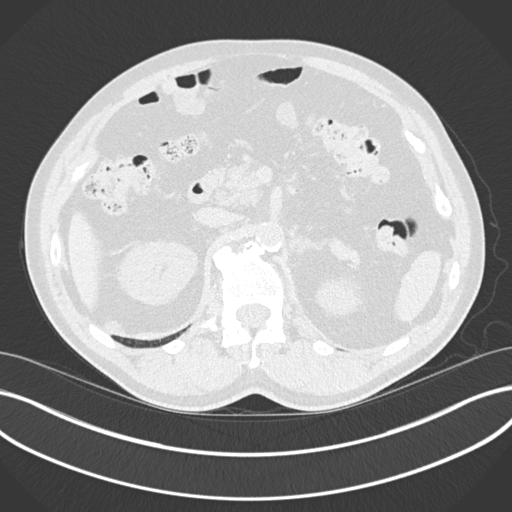
[im 22/168  lung]
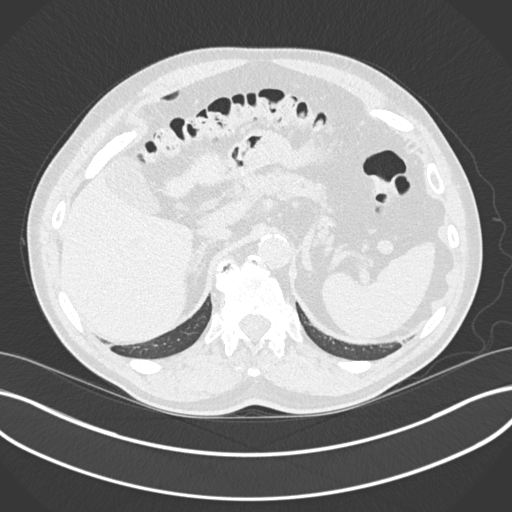
[im 37/168  lung]
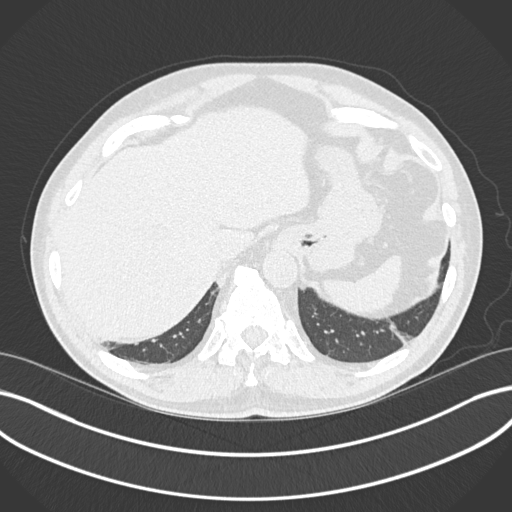
[im 51/168  lung]
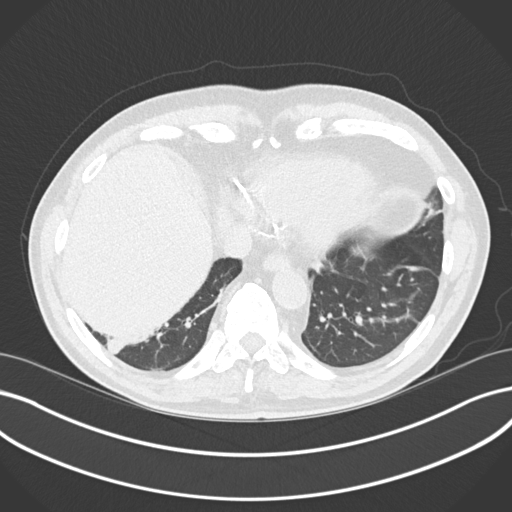
[im 66/168  mediastinal]
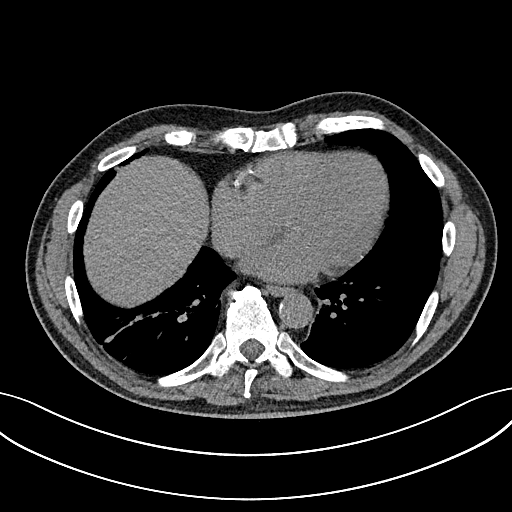
[im 66/168  lung]
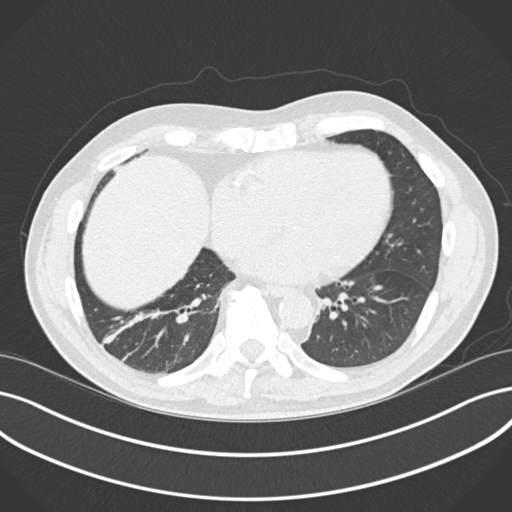
[im 80/168  lung]
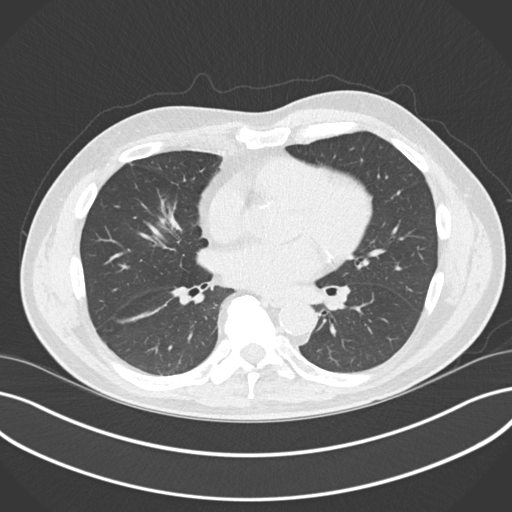
[im 88/168  lung]
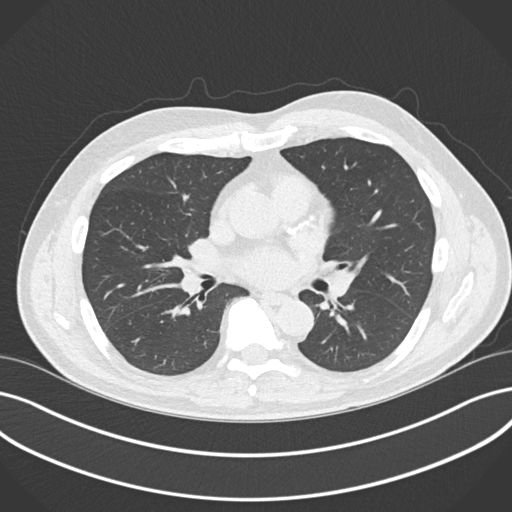
[im 102/168  lung]
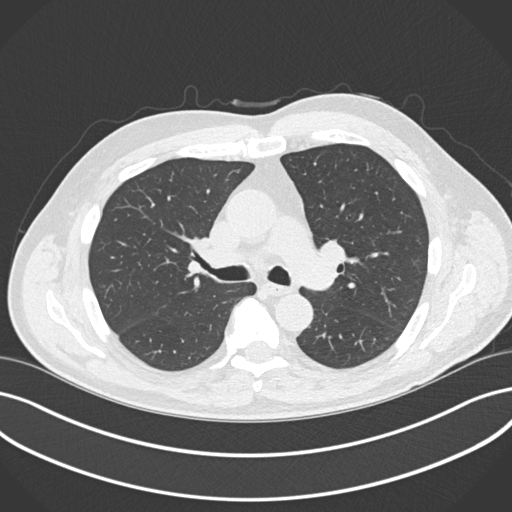
[im 117/168  mediastinal]
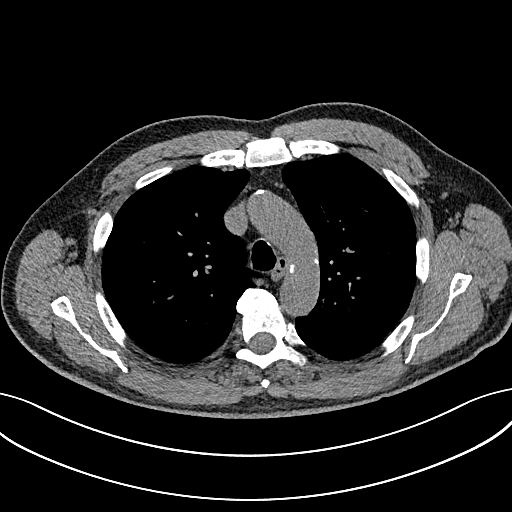
[im 117/168  lung]
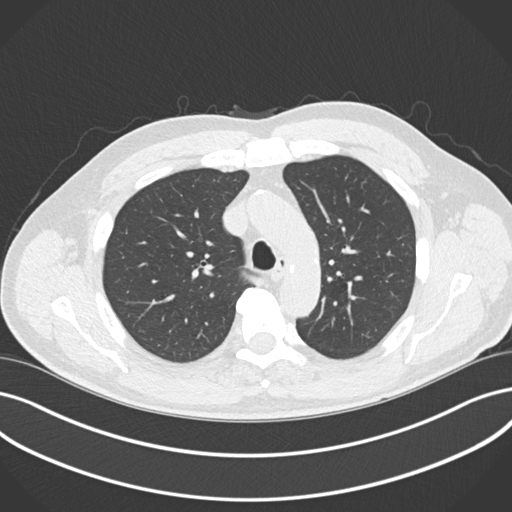
[im 131/168  lung]
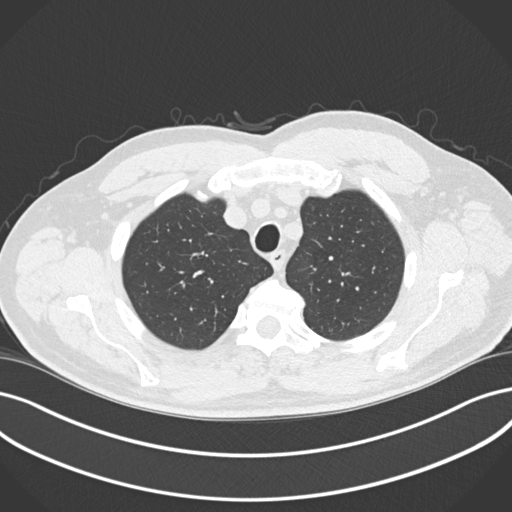
[im 146/168  lung]
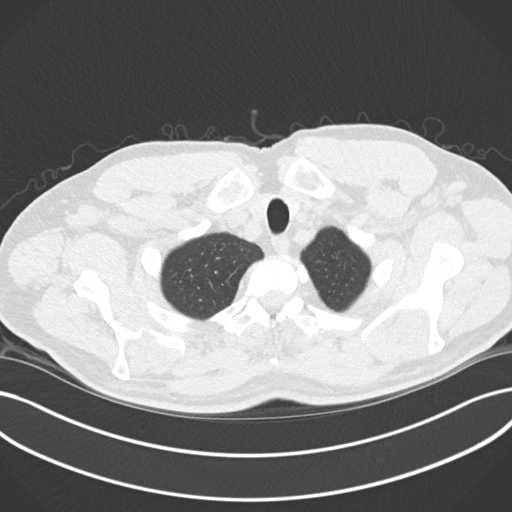
[im 160/168  lung]
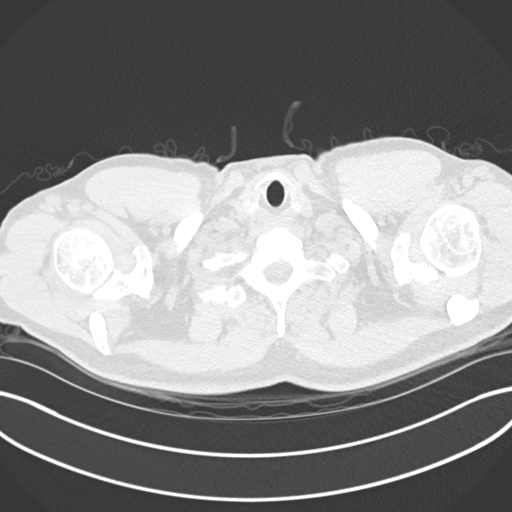

[Series 7: coronal · coronal · 0.68mm/px · 3 of 147 slices shown]
[im 30/147  lung]
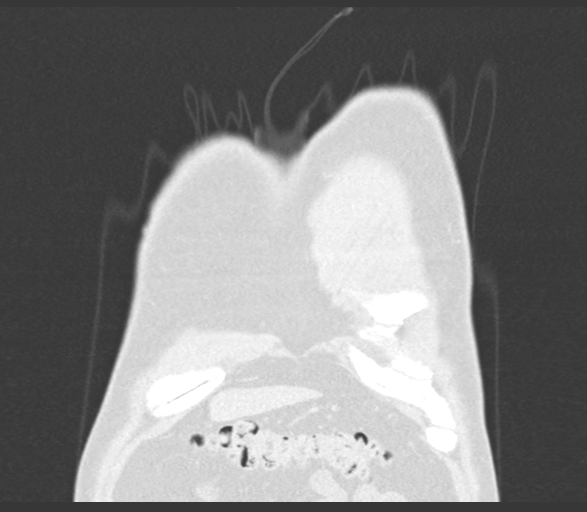
[im 59/147  lung]
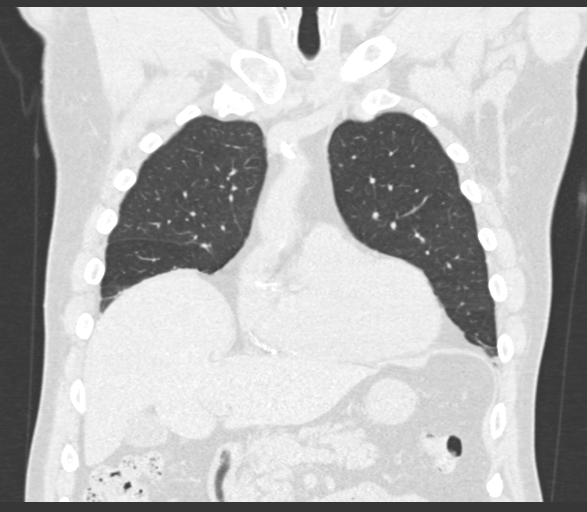
[im 88/147  lung]
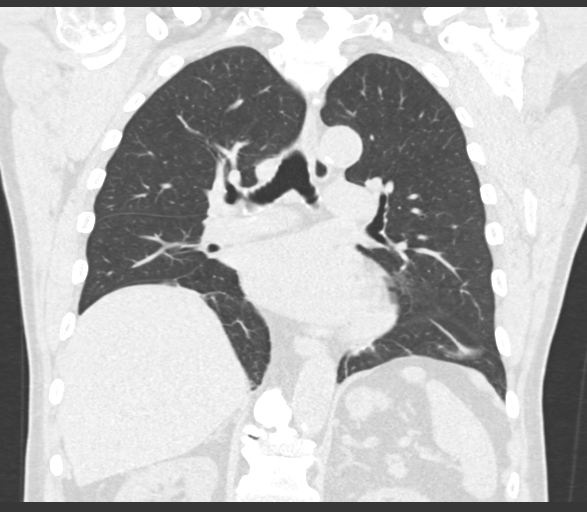

[15 of 36 positions shown; findings below may reference images not displayed]

FINDINGS: Cardiovascular: Normal heart size. No significant pericardial
fluid/thickening. Left main, left anterior descending, left
circumflex and right coronary atherosclerosis. Atherosclerotic
nonaneurysmal thoracic aorta. Normal caliber pulmonary arteries.

Mediastinum/Nodes: No discrete thyroid nodules. Unremarkable
esophagus. No axillary adenopathy. Enlarged 1.4 cm right lower
paratracheal node (series 4/ image 64), stable since [DATE]. No
additional pathologically enlarged mediastinal or gross hilar nodes
on this noncontrast scan.

Lungs/Pleura: No pneumothorax. No pleural effusion. No acute
consolidative airspace disease, lung masses or significant pulmonary
nodules. Mildly thickened parenchymal bands with associated mild
distortion and volume loss are noted in the right middle lobe,
lingula and right greater than left lower lobes, stable since
[DATE], compatible with postinfectious/ postinflammatory
scarring. No significant regions of subpleural reticulation,
ground-glass attenuation, traction bronchiectasis or frank
honeycombing. No significant air trapping on the expiration
sequence.

Upper abdomen: Unremarkable.

Musculoskeletal: No aggressive appearing focal osseous lesions.
Marked thoracic spondylosis.
IMPRESSION: 1. No evidence of interstitial lung disease.
2. Stable bibasilar parenchymal banding, compatible with
postinfectious/postinflammatory scarring.
3. Stable mild right lower paratracheal adenopathy, for which 2
month stability has been demonstrated, suggesting benign reactive
adenopathy.
4. Left main and 3 vessel coronary atherosclerosis.

Aortic Atherosclerosis ([CT]-[CT]).

## 2017-03-04 MED ORDER — PITAVASTATIN CALCIUM 1 MG PO TABS: 1.0000 mg | ORAL_TABLET | Freq: Every day | ORAL | 6 refills | Status: DC

## 2017-03-04 MED ORDER — ASPIRIN EC 81 MG PO TBEC: 81.0000 mg | DELAYED_RELEASE_TABLET | Freq: Every day | ORAL | 3 refills | Status: DC

## 2017-03-04 NOTE — Patient Instructions (Signed)
   [facility] [address] [place] [address] Dept: [phone] Loc: [phone]  AHNAF CAPONI  03/04/2017  You are scheduled for a Cardiac Catheterization on Monday, July 30 with Dr. Glenetta Hew.  1. Please arrive at the [facility] (Main Entrance A) at [facility]: [address] [place], [place] [address] at 5:30 AM (two hours before your procedure to ensure your preparation). Free valet parking service is available.   Special note: Every effort is made to have your procedure done on time. Please understand that emergencies sometimes delay scheduled procedures.  2. Diet: Do not eat or drink anything after midnight prior to your procedure except sips of water to take medications.  3. Labs: TODAY AT [facility] --CBC, BMP ,PT  4. Medication instructions in preparation for your procedure:   On the morning of your procedure, take your Aspirin 81 MG and any morning medicines NOT listed above.  You may use sips of water.  5. Plan for one night stay--bring personal belongings. 6. Bring a current list of your medications and current insurance cards. 7. You MUST have a responsible person to drive you home. 8. Someone MUST be with you the first 24 hours after you arrive home or your discharge will be delayed. 9. Please wear clothes that are easy to get on and off and wear slip-on shoes.  Thank you for allowing [place] to care for you!   -- [facility]     Your physician recommends that you schedule a follow-up appointment in [place]. If you need a refill on your cardiac medications before your next appointment, please call your pharmacy.

## 2017-03-04 NOTE — Progress Notes (Signed)
PCP: Crist Infante, MD  Clinic Note: Chief Complaint  Patient presents with  . Shortness of Breath    pt states some SOB   . Follow-up    Myoview    HPI: Jordan [name] is a 76 y.o. male who is being seen today for follow-up evaluation of exertional dyspnea/exercise intolerance at the request of Crist Infante, MD.  Jordan [name] was initially seen on June 11 with complaint of progressively worsening exertional dyspnea  -- most notably dyspnea walking up hill or up stairs.  As this was a relatively new symptom for him, he was evaluated with a Myoview ST. He now presents to discuss the results of the Intermediate Risk Test with an anterior perfusion defect & grossly abnormal TM ST portion.  Recent Hospitalizations: n/a  Studies Personally Reviewed - (if available, images/films reviewed: From Epic Chart or Care Everywhere)  Myoview 02/12/2017:  Nuclear stress EF: 56%.  The left ventricular ejection [name] is normal (55-65%).  Blood pressure demonstrated a hypertensive response to exercise.  Horizontal ST segment depression ST segment depression of 3 mm was noted during stress in the II, III, aVF, V6, V5 and V4 leads.  Defect 1: There is a small defect of moderate severity.   Small size, moderate intensity reversible (SDS 4) mid to distal anterior wall perfusion defect suggestive of ischemia. Abnormal exercise electrocardiogram with 3 mm horizontal ST segment depression in II, III, AVF, V4-V6 and 2 mm ST elevation in AVR. LVEF 56% with normal wall motion. Exercise tolerance was good at 10 minutes, however, fatigue and dyspnea were reported. This is an intermediate risk test - further coronary evaluation may be warranted.  Interval History: Jordan [name] returns today for follow-up having been vacationing at his [place] house with his family. He has been quite active exercising at the beach, riding his bicycle and doing kayaking. He has not necessarily noted any significant exertional dyspnea, but  he usually does note diffuse riding his bike up the hill more so than [name]. He was not exerting himself to the same extent that he had been on base of the results of these tests being called to him. He has not had any chest tightness or pressure with rest or exertion, but does note when his "breath gets short, he does feel a slight tightening in his chest - that he describes as just feeling a sense of tiredness in his chest.  He is not had any other cardiac symptoms of PND, orthopnea or edema, palpitations, rapid irregular heartbeats or syncope/near syncope, TIAs/ amaurosis fugax.  No claudication.  ROS: A comprehensive was performed. Review of Systems  Constitutional: Negative for malaise/fatigue.  HENT: Negative for congestion and nosebleeds.   Respiratory: Positive for shortness of breath (Per history of present illness). Negative for cough and wheezing.   Gastrointestinal: Negative for blood in stool and melena.  Genitourinary: Negative for hematuria.  Musculoskeletal: Negative for joint pain.  Skin: Negative.        Not as many scratches since [place].  Neurological: Negative for dizziness.  Psychiatric/Behavioral: Negative.   All other systems reviewed and are negative.  I have reviewed and (if needed) personally updated the patient's problem list, medications, allergies, past medical and surgical history, social and family history.   Past Medical History:  Diagnosis Date  . DDD (degenerative disc disease)   . ED (erectile dysfunction)   . GERD (gastroesophageal reflux disease)   . HTN (hypertension)   .  Hyperlipidemia   . Impaired fasting glucose   . Kidney stones   . Knee pain    bilateral  . Meniere's disease   . Meniere's vertigo   . 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 Ocular migraine   . Shoulder pain   . Wears hearing aid     Past Surgical History:  Procedure Laterality Date  . Cardiac Stress Test  2008   Normal  . CARPAL TUNNEL  RELEASE Left 1998  . CARPAL TUNNEL RELEASE Right 2008  . HIATAL HERNIA REPAIR    . KNEE CARTILAGE SURGERY Bilateral   . MEDIAL PARTIAL KNEE REPLACEMENT Right 2010   Partial right knee replacement  . ROTATOR CUFF REPAIR  2008  . TONSILLECTOMY      Current Meds  Medication Sig  . Calcium Citrate-Vitamin D (CITRACAL MAXIMUM) 315-250 MG-UNIT TABS Take 2 tablets by mouth daily.  . cholecalciferol (VITAMIN D) 1000 UNITS tablet Take 4,000 Units by mouth daily.   . Cyanocobalamin (VITAMIN B-12 PO) Take 1 tablet by mouth daily.  Marland Kitchen ezetimibe (ZETIA) 10 MG tablet Take 10 mg by mouth daily.  . Multiple Vitamins-Minerals (CENTRUM SILVER PO) Take 1 tablet by mouth daily.  . Omega-3 Fatty Acids (FISH OIL) 1000 MG CAPS Take 2 capsules by mouth daily.   . ranitidine (ZANTAC) 150 MG tablet Take 150 mg by mouth 2 (two) times daily.  . tadalafil (CIALIS) 20 MG tablet Take 10 mg by mouth daily as needed.    . valsartan (DIOVAN) 80 MG tablet Take 80 mg by mouth daily.    Allergies  Allergen Reactions  . Crestor [Rosuvastatin Calcium] Other (See Comments)    Memory issues    Social History   Social History  . Marital status: Married    Spouse name: N/A  . Number of children: 2  . Years of education: N/A   Occupational History  . retired    Social History Main Topics  . Smoking status: Never Smoker  . Smokeless tobacco: Never Used  . Alcohol use 9.6 oz/week    14 Glasses of wine, 2 Shots of liquor per week  . Drug use: No  . Sexual activity: Yes   Other Topics Concern  . None   Social History Narrative   Jordan [name] is a very pleasant married gentleman with 2 children Jordan [name] and Jordan [name]) each with 3 children/total 6 grandchildren. He is currently retired now from his original job in [employer], however he still serves on [employer] at [employer] and recently stepped down from [employer] at [employer].   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    For the last several months he has been off caffeine and wine because of GI upset symptoms and palpitations. Also trying to lose weight.     He used to drink maybe 2 glasses of wine at night, and he is subsequently all quit alcohol.   He is relatively active with exercise but not with routine pattern for standard routine. He does push AND other calisthenics as well as kayaking and bike riding.     family history includes Cancer in his mother; Coronary artery disease in his father; Heart attack in his father; Heart disease in his father; Heart failure in his mother; Hyperlipidemia in his brother, father, and son; Hypertension in his brother.  Wt Readings from Last 3 Encounters:  03/04/17 184 lb 9.6 oz (83.7 kg)  02/26/17  184 lb (83.5 kg)  02/12/17 185 lb (83.9 kg)    PHYSICAL EXAM BP 120/68   Pulse 69   Ht 5\' 7"  (1.702 m)   Wt 184 lb 9.6 oz (83.7 kg)   SpO2 92%   BMI 28.91 kg/m  General appearance: alert, cooperative, appears stated age, no distress.Well-nourished, well-groomed. Healthy-appearing HEENT: [place]/AT, EOMI, MMM, anicteric sclera Neck: no adenopathy, no carotid bruit and no JVD Lungs: clear to auscultation bilaterally, normal percussion bilaterally and non-labored Heart: regular rate and rhythm, S1 &S2 normal, no murmur, click, rub or gallop; nondisplaced PMI Abdomen: soft, non-tender; bowel sounds normal; no masses,  no organomegaly; no HJR Extremities: extremities normal, atraumatic, no cyanosis, or edema. Pulses: 2+ and symmetric;  Skin: mobility and turgor normal, no edema and no evidence of bleeding or bruising or  Neurologic: Mental status: Alert & oriented x 3, thought content appropriate; non-focal exam.  Pleasant mood & affect. Cranial nerves: normal (II-XII grossly intact)    Adult ECG Report n/a  Other studies Reviewed: Additional studies/ records that were reviewed today include:  Recent Labs:    Pertinent labs include: Potassium 5.0,  BUN/creatinine 23/0.9. H/H 16.5/49.5. Total cholesterol 217, TG 124, HDL 53, LDL 139   ASSESSMENT / PLAN: Problem List Items Addressed This Visit    Abnormal nuclear stress test - Primary    Somewhat minimal symptoms of just notably worsening exertional dyspnea, however with grossly abnormal EKG findings on the treadmill portion of the stress test and a potential anterior perfusion defect suggestive of LAD disease, I think it is prudent to proceed with cardiac catheterization.  Plan: Schedule him cardiac catheterization on 03/18/2017,  Performing MD:  Glenetta Hew, M.D., M.S.  The procedure with Risks/Benefits/Alternatives and Indications was reviewed with the patient & his wife.  All questions were answered.    Risks / Complications include, but not limited to: Death, MI, CVA/TIA, VF/VT (with defibrillation), Bradycardia (need for temporary pacer placement), contrast induced nephropathy , bleeding / bruising / hematoma / pseudoaneurysm, vascular or coronary injury (with possible emergent CT or Vascular Surgery), adverse medication reactions, infection.  Additional risks involving the use of radiation with the possibility of radiation burns and cancer were explained in detail.  The patient (and family) voice understanding and agree to proceed.         Relevant Orders   LEFT HEART CATHETERIZATION WITH CORONARY ANGIOGRAM   Chest pain with moderate risk for cardiac etiology    Chest pain at moderate risk for cardiac etiology may very well be a typical angina with major symptoms exertional dyspnea. With an abnormal Myoview stress test, and a history of his lipidemia and hypertension as risk factors, we will proceed with invasive evaluation with cardiac catheterization.  He is already on statin and ARB. We'll add aspirin. Because some of his symptom was exertional fatigue, I don't want to necessarily add a beta blocker at this point.      Dyspnea on exertion    Relatively new  finding/symptom for him. Could potentially be his anginal equivalent.  Evaluated with a Myoview stress test that showed evidence ischemia. Normal function. No prior infarct.  Plan: Left heart catheterization with coronary angiogram +/- PCI.      Essential hypertension (Chronic)    Well-controlled on valsartan. Depending on the extent of CAD, we may want to consider beta blocker, but for now we'll hold off      Relevant Medications   aspirin EC 81 MG tablet   Pitavastatin Calcium 1  MG TABS   Hyperlipidemia (Chronic)    Goal LDL is again very close to 70 based on abnormal stress test. He had memory issues with Crestor in the past.   Based on cross-reactivity, if he has memory issues with Crestor, will likely have it with simvastatin or atorvastatin both of which are not as potent as Crestor. The best option here would be Livalo. -- Starting low-dose Livalo..      Relevant Medications   aspirin EC 81 MG tablet   Pitavastatin Calcium 1 MG TABS    Other Visit Diagnoses    DOE (dyspnea on exertion)       Relevant Orders   Protime-INR (Completed)   LEFT HEART CATHETERIZATION WITH CORONARY ANGIOGRAM   Pre-op testing       Relevant Orders   Basic metabolic panel (Completed)   CBC (Completed)   Protime-INR (Completed)   Clotting disorder (Rafael Capo)       Relevant Orders   Protime-INR (Completed)     45-50 min spent with the patient & his wife. > 50% of the time was in direct counseling, discussing results & treatment options aslong with discussing cardiac cath with consent.  Current medicines are reviewed at length with the patient today. (+/- concerns) n/a The following changes have been made: n/a  Patient Instructions    [facility] [address] [place] [address] Dept: [phone] Loc: [phone]  Jordan D Metter  03/04/2017  You are scheduled for a Cardiac Catheterization on  Monday, July 30 with Dr. Glenetta Hew.  1. Please arrive at the [facility] (Main Entrance A) at [facility]: [address] [place], [place] [address] at 5:30 AM (two hours before your procedure to ensure your preparation). Free valet parking service is available.   Special note: Every effort is made to have your procedure done on time. Please understand that emergencies sometimes delay scheduled procedures.  2. Diet: Do not eat or drink anything after midnight prior to your procedure except sips of water to take medications.  3. Labs: TODAY AT [facility] --CBC, BMP ,PT  4. Medication instructions in preparation for your procedure:   On the morning of your procedure, take your Aspirin 81 MG and any morning medicines NOT listed above.  You may use sips of water.  5. Plan for one night stay--bring personal belongings. 6. Bring a current list of your medications and current insurance cards. 7. You MUST have a responsible person to drive you home. 8. Someone MUST be with you the first 24 hours after you arrive home or your discharge will be delayed. 9. Please wear clothes that are easy to get on and off and wear slip-on shoes.  Thank you for allowing [place] to care for you!   -- [facility]     Your physician recommends that you schedule a follow-up appointment in [place]. If you need a refill on your cardiac medications before your next appointment, please call your pharmacy.      Studies Ordered:   Orders Placed This Encounter  Procedures  . Basic metabolic panel  . CBC  . Protime-INR  . LEFT HEART CATHETERIZATION WITH CORONARY Illene Silver, M.D., M.S. Interventional Cardiologist   Pager # [phone] Phone # [phone] [address] [place], [place] [address]

## 2017-03-05 ENCOUNTER — Encounter: Payer: Self-pay | Admitting: Cardiology

## 2017-03-05 DIAGNOSIS — R9439 Abnormal result of other cardiovascular function study: Secondary | ICD-10-CM | POA: Insufficient documentation

## 2017-03-05 HISTORY — DX: Abnormal result of other cardiovascular function study: R94.39

## 2017-03-05 NOTE — Assessment & Plan Note (Signed)
Well-controlled on valsartan. Depending on the extent of CAD, we may want to consider beta blocker, but for now we'll hold off

## 2017-03-05 NOTE — Assessment & Plan Note (Signed)
Relatively new finding/symptom for him. Could potentially be his anginal equivalent.  Evaluated with a Myoview stress test that showed evidence ischemia. Normal function. No prior infarct.  Plan: Left heart catheterization with coronary angiogram +/- PCI.

## 2017-03-05 NOTE — Assessment & Plan Note (Signed)
Somewhat minimal symptoms of just notably worsening exertional dyspnea, however with grossly abnormal EKG findings on the treadmill portion of the stress test and a potential anterior perfusion defect suggestive of LAD disease, I think it is prudent to proceed with cardiac catheterization.  Plan: Schedule him cardiac catheterization on 03/18/2017,  Performing MD:  Glenetta Hew, M.D., M.S.  The procedure with Risks/Benefits/Alternatives and Indications was reviewed with the patient & his wife.  All questions were answered.    Risks / Complications include, but not limited to: Death, MI, CVA/TIA, VF/VT (with defibrillation), Bradycardia (need for temporary pacer placement), contrast induced nephropathy , bleeding / bruising / hematoma / pseudoaneurysm, vascular or coronary injury (with possible emergent CT or Vascular Surgery), adverse medication reactions, infection.  Additional risks involving the use of radiation with the possibility of radiation burns and cancer were explained in detail.  The patient (and family) voice understanding and agree to proceed.

## 2017-03-05 NOTE — Assessment & Plan Note (Signed)
Chest pain at moderate risk for cardiac etiology may very well be a typical angina with major symptoms exertional dyspnea. With an abnormal Myoview stress test, and a history of his lipidemia and hypertension as risk factors, we will proceed with invasive evaluation with cardiac catheterization.  He is already on statin and ARB. We'll add aspirin. Because some of his symptom was exertional fatigue, I don't want to necessarily add a beta blocker at this point.

## 2017-03-05 NOTE — Assessment & Plan Note (Signed)
Goal LDL is again very close to 70 based on abnormal stress test. He had memory issues with Crestor in the past.   Based on cross-reactivity, if he has memory issues with Crestor, will likely have it with simvastatin or atorvastatin both of which are not as potent as Crestor. The best option here would be Livalo. -- Starting low-dose Livalo.Marland Kitchen

## 2017-03-06 LAB — CBC
HEMATOCRIT: 47.7 % (ref 37.5–51.0)
Hemoglobin: 15.7 g/dL (ref 13.0–17.7)
MCH: 30.3 pg (ref 26.6–33.0)
MCHC: 32.9 g/dL (ref 31.5–35.7)
MCV: 92 fL (ref 79–97)
Platelets: 207 10*3/uL (ref 150–379)
RBC: 5.18 x10E6/uL (ref 4.14–5.80)
RDW: 13.9 % (ref 12.3–15.4)
WBC: 5.8 10*3/uL (ref 3.4–10.8)

## 2017-03-06 LAB — BASIC METABOLIC PANEL
BUN / CREAT RATIO: 18 (ref 10–24)
BUN: 17 mg/dL (ref 8–27)
CALCIUM: 9.4 mg/dL (ref 8.6–10.2)
CHLORIDE: 101 mmol/L (ref 96–106)
CO2: 21 mmol/L (ref 20–29)
Creatinine, Ser: 0.92 mg/dL (ref 0.76–1.27)
GFR calc Af Amer: 94 mL/min/{1.73_m2} (ref >59)
GFR calc non Af Amer: 82 mL/min/{1.73_m2} (ref >59)
GLUCOSE: 87 mg/dL (ref 65–99)
Potassium: 5.6 mmol/L — ABNORMAL HIGH (ref 3.5–5.2)
Sodium: 143 mmol/L (ref 134–144)

## 2017-03-06 LAB — PROTIME-INR
INR: 1.1 (ref 0.8–1.2)
PROTHROMBIN TIME: 11.8 s (ref 9.1–12.0)

## 2017-03-11 ENCOUNTER — Telehealth: Payer: Self-pay | Admitting: *Deleted

## 2017-03-11 NOTE — Telephone Encounter (Signed)
Left message for patient to call back with results and instructions, per Dr. Ellyn Hack:  Lab results for preprocedure testing showed essentially normal kidney function, and CBC.  The only abnormal finding was potassium of 5.6 - would recommend increasing fluid hydration, and hold valsartan for 2 days now, and 2 days precath. We will recheck potassium in the Cath Lab.

## 2017-03-12 ENCOUNTER — Telehealth: Payer: Self-pay | Admitting: Internal Medicine

## 2017-03-12 NOTE — Telephone Encounter (Signed)
Elise  Please tell R D Buxbaum that no evidence iof ILD. He is to fu with TP later this month afte rhis PFT. Depending on that and his cath result he might or might not need CPST   Dr. Brand Males, M.D., [facility].C.P Pulmonary and Critical Care Medicine Staff Physician [facility] Pulmonary and Critical Care Pager: [phone], If no answer or between  15:00h - 7:00h: call [phone]  03/12/2017 2:48 PM     IMPRESSION: 1. No evidence of interstitial lung disease. 2. Stable bibasilar parenchymal banding, compatible with postinfectious/postinflammatory scarring. 3. Stable mild right lower paratracheal adenopathy, for which 2 month stability has been demonstrated, suggesting benign reactive adenopathy. 4. 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  Aortic Atherosclerosis (ICD10-I70.0).   Electronically Signed By: Ilona Sorrel M.D. On: 03/05/2017 08:19

## 2017-03-14 ENCOUNTER — Telehealth: Payer: Self-pay

## 2017-03-14 NOTE — Telephone Encounter (Signed)
Detailed message left per DPR.  Patient contacted pre-catheterization at [facility] scheduled for:  03/18/2017 @ 0730 Verified arrival time and place:  [facility] @ 0530 Confirmed AM meds to be taken pre-cath with sip of water: 1.  Notified Pt to take ASA prior to arrival 2.  Reminded Pt to hold valsartan 2 days prior to and 2 days post cath  Reminded patient must have responsible person to drive home post procedure and observe patient for 24 hours  Addl concerns:  Left this nurse name and # for call back

## 2017-03-15 ENCOUNTER — Telehealth: Payer: Self-pay | Admitting: Cardiology

## 2017-03-15 NOTE — Telephone Encounter (Signed)
Spoke to patient regarding concerns and questions about upcoming cath.    Patient wondering if he would need to be placed on antiplatelet or blood thinners if stent was placed.    Explained the differences and purposes of blood thinner vs. antiplatelet and informed that this is a possibility but ultimately it would depend on what the outcome of the cath and if any intervention was performed, and unfortunately I am unable to answer this or Dr. Ellyn Hack until cath is performed.   Patient also wondering if his "breathing problem" could be related to exercise induced asthma.   Advised that the cath is to assess for cardiac causes of "breathing issues"  and depending on the outcome, other causes could be assessed or addressed.   Patient states he has upcoming PFT the day after cath and wanted to verify this would be okay, advised this should be fine.  Patient aware and verbalized understanding, thanked me for speaking with him.

## 2017-03-15 NOTE — Telephone Encounter (Signed)
Called and spoke to pt. Informed him of the results and recs per MR. Pt verbalized understanding and denied any further questions or concerns at this time.  

## 2017-03-15 NOTE — Telephone Encounter (Signed)
Very good answers to his questions.  Glenetta Hew, MD

## 2017-03-15 NOTE — Telephone Encounter (Signed)
°  New Prob  Pt is requesting to speak to Dr. Ellyn Hack regarding his upcoming cath. Please call.

## 2017-03-18 ENCOUNTER — Encounter (HOSPITAL_COMMUNITY): Payer: Self-pay | Admitting: Cardiology

## 2017-03-18 ENCOUNTER — Ambulatory Visit (HOSPITAL_COMMUNITY)
Admission: RE | Admit: 2017-03-18 | Discharge: 2017-03-18 | Disposition: A | Discharge status: Home / Self Care | Payer: Medicare Other | Source: Ambulatory Visit | Attending: Cardiology | Admitting: Cardiology

## 2017-03-18 ENCOUNTER — Ambulatory Visit (HOSPITAL_COMMUNITY)
Admission: RE | Disposition: A | Discharge status: Home / Self Care | Payer: Self-pay | Source: Ambulatory Visit | Attending: Cardiology

## 2017-03-18 DIAGNOSIS — Z8249 Family history of ischemic heart disease and other diseases of the circulatory system: Secondary | ICD-10-CM | POA: Diagnosis not present

## 2017-03-18 DIAGNOSIS — R079 Chest pain, unspecified: Secondary | ICD-10-CM | POA: Diagnosis present

## 2017-03-18 DIAGNOSIS — R0609 Other forms of dyspnea: Secondary | ICD-10-CM | POA: Diagnosis present

## 2017-03-18 DIAGNOSIS — E785 Hyperlipidemia, unspecified: Secondary | ICD-10-CM | POA: Diagnosis present

## 2017-03-18 DIAGNOSIS — I2510 Atherosclerotic heart disease of native coronary artery without angina pectoris: Secondary | ICD-10-CM | POA: Diagnosis not present

## 2017-03-18 DIAGNOSIS — K219 Gastro-esophageal reflux disease without esophagitis: Secondary | ICD-10-CM | POA: Diagnosis not present

## 2017-03-18 DIAGNOSIS — H8109 Meniere's disease, unspecified ear: Secondary | ICD-10-CM | POA: Diagnosis not present

## 2017-03-18 DIAGNOSIS — I10 Essential (primary) hypertension: Secondary | ICD-10-CM | POA: Insufficient documentation

## 2017-03-18 DIAGNOSIS — Z01818 Encounter for other preprocedural examination: Secondary | ICD-10-CM

## 2017-03-18 DIAGNOSIS — R9439 Abnormal result of other cardiovascular function study: Secondary | ICD-10-CM | POA: Diagnosis present

## 2017-03-18 DIAGNOSIS — I25119 Atherosclerotic heart disease of native coronary artery with unspecified angina pectoris: Secondary | ICD-10-CM | POA: Insufficient documentation

## 2017-03-18 DIAGNOSIS — I2584 Coronary atherosclerosis due to calcified coronary lesion: Secondary | ICD-10-CM | POA: Insufficient documentation

## 2017-03-18 HISTORY — PX: LEFT HEART CATH AND CORONARY ANGIOGRAPHY: CATH118249

## 2017-03-18 HISTORY — DX: Atherosclerotic heart disease of native coronary artery without angina pectoris: I25.10

## 2017-03-18 LAB — BASIC METABOLIC PANEL
ANION GAP: 6 (ref 5–15)
BUN: 18 mg/dL (ref 6–20)
CALCIUM: 8.6 mg/dL — AB (ref 8.9–10.3)
CHLORIDE: 109 mmol/L (ref 101–111)
CO2: 26 mmol/L (ref 22–32)
Creatinine, Ser: 0.89 mg/dL (ref 0.61–1.24)
GFR calc non Af Amer: >60 mL/min (ref >60)
GLUCOSE: 105 mg/dL — AB (ref 65–99)
Potassium: 4.2 mmol/L (ref 3.5–5.1)
Sodium: 141 mmol/L (ref 135–145)

## 2017-03-18 SURGERY — LEFT HEART CATH AND CORONARY ANGIOGRAPHY
Anesthesia: LOCAL

## 2017-03-18 MED ORDER — SODIUM CHLORIDE 0.9 % IV SOLN: INTRAVENOUS | Status: DC

## 2017-03-18 MED ORDER — SODIUM CHLORIDE 0.9% FLUSH: 3.0000 mL | Freq: Two times a day (BID) | INTRAVENOUS | Status: DC

## 2017-03-18 MED ORDER — MIDAZOLAM HCL 2 MG/2ML IJ SOLN
INTRAMUSCULAR | Status: DC | PRN
  Administered 2017-03-18: 2 mg via INTRAVENOUS

## 2017-03-18 MED ORDER — IOPAMIDOL (ISOVUE-370) INJECTION 76%
INTRAVENOUS | Status: AC
  Filled 2017-03-18: qty 100

## 2017-03-18 MED ORDER — IOPAMIDOL (ISOVUE-370) INJECTION 76%
INTRAVENOUS | Status: DC | PRN
  Administered 2017-03-18: 110 mL via INTRA_ARTERIAL

## 2017-03-18 MED ORDER — SODIUM CHLORIDE 0.9% FLUSH
3.0000 mL | INTRAVENOUS | Status: DC | PRN
Start: 2017-03-18 — End: 2017-03-18

## 2017-03-18 MED ORDER — VERAPAMIL HCL 2.5 MG/ML IV SOLN
INTRAVENOUS | Status: AC
  Filled 2017-03-18: qty 2

## 2017-03-18 MED ORDER — NITROGLYCERIN 1 MG/10 ML FOR IR/CATH LAB
INTRA_ARTERIAL | Status: AC
  Filled 2017-03-18: qty 10

## 2017-03-18 MED ORDER — LIDOCAINE HCL (PF) 1 % IJ SOLN
INTRAMUSCULAR | Status: AC
  Filled 2017-03-18: qty 30

## 2017-03-18 MED ORDER — FENTANYL CITRATE (PF) 100 MCG/2ML IJ SOLN
INTRAMUSCULAR | Status: DC | PRN
  Administered 2017-03-18: 25 ug via INTRAVENOUS

## 2017-03-18 MED ORDER — FENTANYL CITRATE (PF) 100 MCG/2ML IJ SOLN
INTRAMUSCULAR | Status: AC
  Filled 2017-03-18: qty 2

## 2017-03-18 MED ORDER — HEPARIN SODIUM (PORCINE) 1000 UNIT/ML IJ SOLN
INTRAMUSCULAR | Status: DC | PRN
  Administered 2017-03-18: 4000 [IU] via INTRAVENOUS

## 2017-03-18 MED ORDER — SODIUM CHLORIDE 0.9 % IV SOLN
INTRAVENOUS | Status: DC
  Administered 2017-03-18: 06:00:00 via INTRAVENOUS

## 2017-03-18 MED ORDER — LIDOCAINE HCL (PF) 1 % IJ SOLN
INTRAMUSCULAR | Status: DC | PRN
  Administered 2017-03-18: 3 mL via INTRADERMAL

## 2017-03-18 MED ORDER — SODIUM CHLORIDE 0.9% FLUSH: 3.0000 mL | INTRAVENOUS | Status: DC | PRN

## 2017-03-18 MED ORDER — SODIUM CHLORIDE 0.9 % IV SOLN: 250.0000 mL | INTRAVENOUS | Status: DC | PRN

## 2017-03-18 MED ORDER — METOPROLOL TARTRATE 25 MG PO TABS: 12.5000 mg | ORAL_TABLET | Freq: Two times a day (BID) | ORAL | 2 refills | Status: DC

## 2017-03-18 MED ORDER — HEPARIN (PORCINE) IN NACL 2-0.9 UNIT/ML-% IJ SOLN
INTRAMUSCULAR | Status: AC
  Filled 2017-03-18: qty 1000

## 2017-03-18 MED ORDER — IOPAMIDOL (ISOVUE-370) INJECTION 76%
INTRAVENOUS | Status: AC
  Filled 2017-03-18: qty 50

## 2017-03-18 MED ORDER — ONDANSETRON HCL 4 MG/2ML IJ SOLN: 4.0000 mg | Freq: Four times a day (QID) | INTRAMUSCULAR | Status: DC | PRN

## 2017-03-18 MED ORDER — VERAPAMIL HCL 2.5 MG/ML IV SOLN
INTRAVENOUS | Status: DC | PRN
  Administered 2017-03-18: 08:00:00 via INTRA_ARTERIAL

## 2017-03-18 MED ORDER — HEPARIN (PORCINE) IN NACL 2-0.9 UNIT/ML-% IJ SOLN
INTRAMUSCULAR | Status: AC | PRN
  Administered 2017-03-18: 1000 mL via INTRA_ARTERIAL

## 2017-03-18 MED ORDER — MIDAZOLAM HCL 2 MG/2ML IJ SOLN
INTRAMUSCULAR | Status: AC
  Filled 2017-03-18: qty 2

## 2017-03-18 MED ORDER — HEPARIN SODIUM (PORCINE) 1000 UNIT/ML IJ SOLN
INTRAMUSCULAR | Status: AC
  Filled 2017-03-18: qty 1

## 2017-03-18 MED ORDER — ACETAMINOPHEN 325 MG PO TABS: 650.0000 mg | ORAL_TABLET | ORAL | Status: DC | PRN

## 2017-03-18 SURGICAL SUPPLY — 14 items
CATH EXPO 5F FL3.5 (CATHETERS) ×2 IMPLANT
CATH INFINITI 5FR ANG PIGTAIL (CATHETERS) ×2 IMPLANT
CATH LAUNCHER 6FR EBU3.5 (CATHETERS) ×2 IMPLANT
CATH OPTITORQUE TIG 4.0 5F (CATHETERS) ×2 IMPLANT
DEVICE RAD COMP TR BAND LRG (VASCULAR PRODUCTS) ×2 IMPLANT
GLIDESHEATH SLEND A-KIT 6F 22G (SHEATH) ×2 IMPLANT
GUIDEWIRE INQWIRE 1.5J.035X260 (WIRE) ×1 IMPLANT
INQWIRE 1.5J .035X260CM (WIRE) ×2
KIT HEART LEFT (KITS) ×2 IMPLANT
PACK CARDIAC CATHETERIZATION (CUSTOM PROCEDURE TRAY) ×2 IMPLANT
SYR MEDRAD MARK V 150ML (SYRINGE) ×2 IMPLANT
TRANSDUCER W/STOPCOCK (MISCELLANEOUS) ×2 IMPLANT
TUBING CIL FLEX 10 FLL-RA (TUBING) ×2 IMPLANT
WIRE HI TORQ VERSACORE-J 145CM (WIRE) ×2 IMPLANT

## 2017-03-18 NOTE — Interval H&P Note (Signed)
History and Physical Interval Note:  03/18/2017 7:22 AM  R D Lessley  has presented today for surgery, with the diagnosis of abnormal nuc stress test  The various methods of treatment have been discussed with the patient and family. After consideration of risks, benefits and other options for treatment, the patient has consented to  Procedure(s): Left Heart Cath and Coronary Angiography (N/A) with possible Percutaneous Coronary Intervention  as a surgical intervention .  The patient's history has been reviewed, patient examined, no change in status, stable for surgery.  I have reviewed the patient's chart and labs.  Questions were answered to the patient's satisfaction.    Cath Lab Visit (complete for each Cath Lab visit)  Clinical Evaluation Leading to the Procedure:   ACS: No.  Non-ACS:    Anginal Classification: CCS II  Anti-ischemic medical therapy: Minimal Therapy (1 class of medications)  Non-Invasive Test Results: Intermediate-risk stress test findings: cardiac mortality 1-3%/year  Prior CABG: No previous CABG   Glenetta Hew

## 2017-03-18 NOTE — H&P (View-Only) (Signed)
PCP: Jordan Infante, MD  Clinic Note: Chief Complaint  Patient presents with  . Shortness of Breath    pt states some SOB   . Follow-up    Myoview    HPI: Jordan [name] is a 76 y.o. male who is being seen today for follow-up evaluation of exertional dyspnea/exercise intolerance at the request of Jordan Infante, MD.  Jordan [name] was initially seen on June 11 with complaint of progressively worsening exertional dyspnea  -- most notably dyspnea walking up hill or up stairs.  As this was a relatively new symptom for him, he was evaluated with a Myoview ST. He now presents to discuss the results of the Intermediate Risk Test with an anterior perfusion defect & grossly abnormal TM ST portion.  Recent Hospitalizations: n/a  Studies Personally Reviewed - (if available, images/films reviewed: From Epic Chart or Care Everywhere)  Myoview 02/12/2017:  Nuclear stress EF: 56%.  The left ventricular ejection [name] is normal (55-65%).  Blood pressure demonstrated a hypertensive response to exercise.  Horizontal ST segment depression ST segment depression of 3 mm was noted during stress in the II, III, aVF, V6, V5 and V4 leads.  Defect 1: There is a small defect of moderate severity.   Small size, moderate intensity reversible (SDS 4) mid to distal anterior wall perfusion defect suggestive of ischemia. Abnormal exercise electrocardiogram with 3 mm horizontal ST segment depression in II, III, AVF, V4-V6 and 2 mm ST elevation in AVR. LVEF 56% with normal wall motion. Exercise tolerance was good at 10 minutes, however, fatigue and dyspnea were reported. This is an intermediate risk test - further coronary evaluation may be warranted.  Interval History: Jordan [name] returns today for follow-up having been vacationing at his [place] house with his family. He has been quite active exercising at the beach, riding his bicycle and doing kayaking. He has not necessarily noted any significant exertional dyspnea, but  he usually does note diffuse riding his bike up the hill more so than [facility]. He was not exerting himself to the same extent that he had been on base of the results of these tests being called to him. He has not had any chest tightness or pressure with rest or exertion, but does note when his "breath gets short, he does feel a slight tightening in his chest - that he describes as just feeling a sense of tiredness in his chest.  He is not had any other cardiac symptoms of PND, orthopnea or edema, palpitations, rapid irregular heartbeats or syncope/near syncope, TIAs/ amaurosis fugax.  No claudication.  ROS: A comprehensive was performed. Review of Systems  Constitutional: Negative for malaise/fatigue.  HENT: Negative for congestion and nosebleeds.   Respiratory: Positive for shortness of breath (Per history of present illness). Negative for cough and wheezing.   Gastrointestinal: Negative for blood in stool and melena.  Genitourinary: Negative for hematuria.  Musculoskeletal: Negative for joint pain.  Skin: Negative.        Not as many scratches since [place].  Neurological: Negative for dizziness.  Psychiatric/Behavioral: Negative.   All other systems reviewed and are negative.  I have reviewed and (if needed) personally updated the patient's problem list, medications, allergies, past medical and surgical history, social and family history.   Past Medical History:  Diagnosis Date  . DDD (degenerative disc disease)   . ED (erectile dysfunction)   . GERD (gastroesophageal reflux disease)   . HTN (hypertension)   .  Hyperlipidemia   . Impaired fasting glucose   . Kidney stones   . Knee pain    bilateral  . Meniere's disease   . Meniere's vertigo   . 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 Ocular migraine   . Shoulder pain   . Wears hearing aid     Past Surgical History:  Procedure Laterality Date  . Cardiac Stress Test  2008   Normal  . CARPAL TUNNEL  RELEASE Left 1998  . CARPAL TUNNEL RELEASE Right 2008  . HIATAL HERNIA REPAIR    . KNEE CARTILAGE SURGERY Bilateral   . MEDIAL PARTIAL KNEE REPLACEMENT Right 2010   Partial right knee replacement  . ROTATOR CUFF REPAIR  2008  . TONSILLECTOMY      Current Meds  Medication Sig  . Calcium Citrate-Vitamin D (CITRACAL MAXIMUM) 315-250 MG-UNIT TABS Take 2 tablets by mouth daily.  . cholecalciferol (VITAMIN D) 1000 UNITS tablet Take 4,000 Units by mouth daily.   . Cyanocobalamin (VITAMIN B-12 PO) Take 1 tablet by mouth daily.  Marland Kitchen ezetimibe (ZETIA) 10 MG tablet Take 10 mg by mouth daily.  . Multiple Vitamins-Minerals (CENTRUM SILVER PO) Take 1 tablet by mouth daily.  . Omega-3 Fatty Acids (FISH OIL) 1000 MG CAPS Take 2 capsules by mouth daily.   . ranitidine (ZANTAC) 150 MG tablet Take 150 mg by mouth 2 (two) times daily.  . tadalafil (CIALIS) 20 MG tablet Take 10 mg by mouth daily as needed.    . valsartan (DIOVAN) 80 MG tablet Take 80 mg by mouth daily.    Allergies  Allergen Reactions  . Crestor [Rosuvastatin Calcium] Other (See Comments)    Memory issues    Social History   Social History  . Marital status: Married    Spouse name: N/A  . Number of children: 2  . Years of education: N/A   Occupational History  . retired    Social History Main Topics  . Smoking status: Never Smoker  . Smokeless tobacco: Never Used  . Alcohol use 9.6 oz/week    14 Glasses of wine, 2 Shots of liquor per week  . Drug use: No  . Sexual activity: Yes   Other Topics Concern  . None   Social History Narrative   Jordan [name] is a very pleasant married gentleman with 2 children Jordan [name] and Jordan [name]) each with 3 children/total 6 grandchildren. He is currently retired now from his original job in [employer], however he still serves on [employer] at [employer] and recently stepped down from [employer] at [employer].   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    For the last several months he has been off caffeine and wine because of GI upset symptoms and palpitations. Also trying to lose weight.     He used to drink maybe 2 glasses of wine at night, and he is subsequently all quit alcohol.   He is relatively active with exercise but not with routine pattern for standard routine. He does push AND other calisthenics as well as kayaking and bike riding.     family history includes Cancer in his mother; Coronary artery disease in his father; Heart attack in his father; Heart disease in his father; Heart failure in his mother; Hyperlipidemia in his brother, father, and son; Hypertension in his brother.  Wt Readings from Last 3 Encounters:  03/04/17 184 lb 9.6 oz (83.7 kg)  02/26/17  184 lb (83.5 kg)  02/12/17 185 lb (83.9 kg)    PHYSICAL EXAM BP 120/68   Pulse 69   Ht 5\' 7"  (1.702 m)   Wt 184 lb 9.6 oz (83.7 kg)   SpO2 92%   BMI 28.91 kg/m  General appearance: alert, cooperative, appears stated age, no distress.Well-nourished, well-groomed. Healthy-appearing HEENT: [place]/AT, EOMI, MMM, anicteric sclera Neck: no adenopathy, no carotid bruit and no JVD Lungs: clear to auscultation bilaterally, normal percussion bilaterally and non-labored Heart: regular rate and rhythm, S1 &S2 normal, no murmur, click, rub or gallop; nondisplaced PMI Abdomen: soft, non-tender; bowel sounds normal; no masses,  no organomegaly; no HJR Extremities: extremities normal, atraumatic, no cyanosis, or edema. Pulses: 2+ and symmetric;  Skin: mobility and turgor normal, no edema and no evidence of bleeding or bruising or  Neurologic: Mental status: Alert & oriented x 3, thought content appropriate; non-focal exam.  Pleasant mood & affect. Cranial nerves: normal (II-XII grossly intact)    Adult ECG Report n/a  Other studies Reviewed: Additional studies/ records that were reviewed today include:  Recent Labs:    Pertinent labs include: Potassium 5.0,  BUN/creatinine 23/0.9. H/H 16.5/49.5. Total cholesterol 217, TG 124, HDL 53, LDL 139   ASSESSMENT / PLAN: Problem List Items Addressed This Visit    Abnormal nuclear stress test - Primary    Somewhat minimal symptoms of just notably worsening exertional dyspnea, however with grossly abnormal EKG findings on the treadmill portion of the stress test and a potential anterior perfusion defect suggestive of LAD disease, I think it is prudent to proceed with cardiac catheterization.  Plan: Schedule him cardiac catheterization on 03/18/2017,  Performing MD:  Glenetta Hew, M.D., M.S.  The procedure with Risks/Benefits/Alternatives and Indications was reviewed with the patient & his wife.  All questions were answered.    Risks / Complications include, but not limited to: Death, MI, CVA/TIA, VF/VT (with defibrillation), Bradycardia (need for temporary pacer placement), contrast induced nephropathy , bleeding / bruising / hematoma / pseudoaneurysm, vascular or coronary injury (with possible emergent CT or Vascular Surgery), adverse medication reactions, infection.  Additional risks involving the use of radiation with the possibility of radiation burns and cancer were explained in detail.  The patient (and family) voice understanding and agree to proceed.         Relevant Orders   LEFT HEART CATHETERIZATION WITH CORONARY ANGIOGRAM   Chest pain with moderate risk for cardiac etiology    Chest pain at moderate risk for cardiac etiology may very well be a typical angina with major symptoms exertional dyspnea. With an abnormal Myoview stress test, and a history of his lipidemia and hypertension as risk factors, we will proceed with invasive evaluation with cardiac catheterization.  He is already on statin and ARB. We'll add aspirin. Because some of his symptom was exertional fatigue, I don't want to necessarily add a beta blocker at this point.      Dyspnea on exertion    Relatively new  finding/symptom for him. Could potentially be his anginal equivalent.  Evaluated with a Myoview stress test that showed evidence ischemia. Normal function. No prior infarct.  Plan: Left heart catheterization with coronary angiogram +/- PCI.      Essential hypertension (Chronic)    Well-controlled on valsartan. Depending on the extent of CAD, we may want to consider beta blocker, but for now we'll hold off      Relevant Medications   aspirin EC 81 MG tablet   Pitavastatin Calcium 1  MG TABS   Hyperlipidemia (Chronic)    Goal LDL is again very close to 70 based on abnormal stress test. He had memory issues with Crestor in the past.   Based on cross-reactivity, if he has memory issues with Crestor, will likely have it with simvastatin or atorvastatin both of which are not as potent as Crestor. The best option here would be Livalo. -- Starting low-dose Livalo..      Relevant Medications   aspirin EC 81 MG tablet   Pitavastatin Calcium 1 MG TABS    Other Visit Diagnoses    DOE (dyspnea on exertion)       Relevant Orders   Protime-INR (Completed)   LEFT HEART CATHETERIZATION WITH CORONARY ANGIOGRAM   Pre-op testing       Relevant Orders   Basic metabolic panel (Completed)   CBC (Completed)   Protime-INR (Completed)   Clotting disorder ([place])       Relevant Orders   Protime-INR (Completed)     45-50 min spent with the patient & his wife. > 50% of the time was in direct counseling, discussing results & treatment options aslong with discussing cardiac cath with consent.  Current medicines are reviewed at length with the patient today. (+/- concerns) n/a The following changes have been made: n/a  Patient Instructions    [facility] [address] [place] [address] Dept: [phone] Loc: [phone]  Jordan D Sonier  03/04/2017  You are scheduled for a Cardiac Catheterization on  Monday, July 30 with Dr. Glenetta Hew.  1. Please arrive at the [facility] (Main Entrance A) at [facility]: [address] [place], [place] [address] at 5:30 AM (two hours before your procedure to ensure your preparation). Free valet parking service is available.   Special note: Every effort is made to have your procedure done on time. Please understand that emergencies sometimes delay scheduled procedures.  2. Diet: Do not eat or drink anything after midnight prior to your procedure except sips of water to take medications.  3. Labs: TODAY AT [facility] --CBC, BMP ,PT  4. Medication instructions in preparation for your procedure:   On the morning of your procedure, take your Aspirin 81 MG and any morning medicines NOT listed above.  You may use sips of water.  5. Plan for one night stay--bring personal belongings. 6. Bring a current list of your medications and current insurance cards. 7. You MUST have a responsible person to drive you home. 8. Someone MUST be with you the first 24 hours after you arrive home or your discharge will be delayed. 9. Please wear clothes that are easy to get on and off and wear slip-on shoes.  Thank you for allowing [place] to care for you!   -- [facility]     Your physician recommends that you schedule a follow-up appointment in [place]. If you need a refill on your cardiac medications before your next appointment, please call your pharmacy.      Studies Ordered:   Orders Placed This Encounter  Procedures  . Basic metabolic panel  . CBC  . Protime-INR  . LEFT HEART CATHETERIZATION WITH CORONARY Illene Silver, M.D., M.S. Interventional Cardiologist   Pager # [phone] Phone # [phone] [address] [place], [place] [address]

## 2017-03-18 NOTE — Discharge Instructions (Signed)

## 2017-03-19 ENCOUNTER — Ambulatory Visit (INDEPENDENT_AMBULATORY_CARE_PROVIDER_SITE_OTHER): Payer: Medicare Other | Admitting: Internal Medicine

## 2017-03-19 ENCOUNTER — Ambulatory Visit (INDEPENDENT_AMBULATORY_CARE_PROVIDER_SITE_OTHER): Payer: Medicare Other | Admitting: Adult Health

## 2017-03-19 ENCOUNTER — Encounter: Payer: Self-pay | Admitting: Adult Health

## 2017-03-19 DIAGNOSIS — R0609 Other forms of dyspnea: Secondary | ICD-10-CM

## 2017-03-19 LAB — PULMONARY FUNCTION TEST
DL/VA % pred: 87 %
DL/VA: 3.9 ml/min/mmHg/L
DLCO cor % pred: 70 %
DLCO cor: 20.91 ml/min/mmHg
DLCO unc % pred: 71 %
DLCO unc: 21.26 ml/min/mmHg
FEF 25-75 Post: 1.87 L/sec
FEF 25-75 Pre: 1.69 L/sec
FEF2575-%Change-Post: 10 %
FEF2575-%Pred-Post: 89 %
FEF2575-%Pred-Pre: 81 %
FEV1-%Change-Post: 1 %
FEV1-%Pred-Post: 79 %
FEV1-%Pred-Pre: 77 %
FEV1-Post: 2.27 L
FEV1-Pre: 2.22 L
FEV1FVC-%Change-Post: 1 %
FEV1FVC-%Pred-Pre: 104 %
FEV6-%Change-Post: 0 %
FEV6-%Pred-Post: 78 %
FEV6-%Pred-Pre: 78 %
FEV6-Post: 2.9 L
FEV6-Pre: 2.91 L
FEV6FVC-%Change-Post: 0 %
FEV6FVC-%Pred-Post: 105 %
FEV6FVC-%Pred-Pre: 106 %
FVC-%Change-Post: 0 %
FVC-%Pred-Post: 73 %
FVC-%Pred-Pre: 73 %
FVC-Post: 2.91 L
FVC-Pre: 2.91 L
Post FEV1/FVC ratio: 78 %
Post FEV6/FVC ratio: 100 %
Pre FEV1/FVC ratio: 76 %
Pre FEV6/FVC Ratio: 100 %
RV % pred: 96 %
RV: 2.35 L
TLC % pred: 83 %
TLC: 5.54 L

## 2017-03-19 MED FILL — Nitroglycerin IV Soln 100 MCG/ML in D5W: INTRA_ARTERIAL | Qty: 10 | Status: AC

## 2017-03-19 NOTE — Progress Notes (Signed)
PFT done today. 

## 2017-03-19 NOTE — Progress Notes (Signed)
@Patient  ID: Jordan [name], male    DOB: 12-06-1940, 76 y.o.   MRN: [id number]  Chief Complaint  Patient presents with  . Follow-up    dypsnea     Referring provider: Crist Infante, MD  HPI: 76 year old male former smoker seen for pulmonary consult 02/26/2017 for DOE   TEST  02/2017 Walking desaturation test on an 185 feet 3 laps on room air in our office. He walked extremely fast. Resting pulse ox was 94%. Final pulse ox was 92%. Resting heart rate was 67/m. Final heart rate was 88/m  02/2017 Exhaled nitric oxide test in office today : feno 28ppb and normal- intermediate  He underwent CT scan of the chest without contrast 01/02/2017:  this is not a high-resolution CT chest showed bilateral lower lobe atelectasis with some groundglass opacities. There is a subcarinal node that is 1.6 cm in size.  03/19/2017 Follow up : DOE  Patient returns for a two-week follow-up. Patient was seen last visit for pulmonary consult. Patient is extremely active and does a lot of exercise. He was noticing that he had some shortness of breath when he went up. He'll. Also he had a CT chest in May 2018 that show some bilateral lobe atelectasis. Patient was set up for a high resolution CT chest done on 03/05/2017 that showed no evidence of interstitial lung disease. There was bibasilar parenchymal scarring. Patient was set up for a pulmonary function test that was done today that showed essentially normal lung function with an FEV1 of around 79%, ratio 78, no significant bronchodilator response, FVC 73%,  mild diffusing defect with a DLCO at 71% Patient has also been undergoing a cardiac workup. Patient had abnormal stress Myoview.Marland Kitchen He underwent a left heart catheter on 03/18/2017 that showed left main lesion 55% stenosed, proximal LAD to mid LAD, 75% stenosed, moderate RCA disease. He has been recommended for surgical consult for possible CABG.  Patient denies any chest pain, orthopnea, PND, cough, wheezing, or  increased leg swelling.   Allergies  Allergen Reactions  . Crestor [Rosuvastatin Calcium] Other (See Comments)    Memory issues & muscle aching     Immunization History  Administered Date(s) Administered  . Influenza, High Dose Seasonal PF 05/20/2016    Past Medical History:  Diagnosis Date  . DDD (degenerative disc disease)   . ED (erectile dysfunction)   . GERD (gastroesophageal reflux disease)   . HTN (hypertension)   . Hyperlipidemia   . Impaired fasting glucose   . Kidney stones   . Knee pain    bilateral  . Meniere's disease   . Meniere's vertigo   . Mild carotid artery disease ([place])   . Ocular migraine   . Shoulder pain   . Wears hearing aid     Tobacco History: History  Smoking Status  . Never Smoker  Smokeless Tobacco  . Never Used   Counseling given: Not Answered   Outpatient Encounter Prescriptions as of 03/19/2017  Medication Sig  . aspirin EC 81 MG tablet Take 1 tablet (81 mg total) by mouth daily. (Patient taking differently: Take 81 mg by mouth every evening. )  . Calcium Citrate-Vitamin D (CITRACAL MAXIMUM) 315-250 MG-UNIT TABS Take 1 tablet by mouth 2 (two) times daily.   . cholecalciferol (VITAMIN D) 1000 UNITS tablet Take 2,000 Units by mouth 2 (two) times daily.   . Cyanocobalamin (VITAMIN B-12 PO) Take 1 tablet by mouth every evening.   . diphenhydramine-acetaminophen (TYLENOL PM) 25-500 MG TABS tablet  Take 0.5 tablets by mouth at bedtime as needed (for sleep.).  Marland Kitchen ezetimibe (ZETIA) 10 MG tablet Take 10 mg by mouth every evening.   . metoprolol tartrate (LOPRESSOR) 25 MG tablet Take 0.5 tablets (12.5 mg total) by mouth 2 (two) times daily.  . Multiple Vitamins-Minerals (CENTRUM SILVER PO) Take 1 tablet by mouth daily.  . naproxen sodium (ANAPROX) 220 MG tablet Per bottle as needed  . Omega-3 Fatty Acids (FISH OIL) 1000 MG CAPS Take 1,000 mg by mouth 2 (two) times daily.   . Pitavastatin Calcium 1 MG TABS Take 1 tablet (1 mg total) by mouth at  bedtime.  . ranitidine (ZANTAC) 150 MG tablet Take 150 mg by mouth 2 (two) times daily.  . tadalafil (CIALIS) 20 MG tablet Take 10 mg by mouth daily as needed for erectile dysfunction.   . valsartan (DIOVAN) 80 MG tablet Take 80 mg by mouth at bedtime.    No facility-administered encounter medications on file as of 03/19/2017.      Review of Systems  Constitutional:   No  weight loss, night sweats,  Fevers, chills, fatigue, or  lassitude.  HEENT:   No headaches,  Difficulty swallowing,  Tooth/dental problems, or  Sore throat,                No sneezing, itching, ear ache, nasal congestion, post nasal drip,   CV:  No chest pain,  Orthopnea, PND, swelling in lower extremities, anasarca, dizziness, palpitations, syncope.   GI  No heartburn, indigestion, abdominal pain, nausea, vomiting, diarrhea, change in bowel habits, loss of appetite, bloody stools.   Resp:   No excess mucus, no productive cough,  No non-productive cough,  No coughing up of blood.  No change in color of mucus.  No wheezing.  No chest wall deformity  Skin: no rash or lesions.  GU: no dysuria, change in color of urine, no urgency or frequency.  No flank pain, no hematuria   MS:  No joint pain or swelling.  No decreased range of motion.  No back pain.    Physical Exam  BP 134/74 (BP Location: Left Arm, Cuff Size: Normal)   Pulse 67   Ht 5\' 8"  (1.727 m)   Wt 186 lb (84.4 kg)   SpO2 92%   BMI 28.28 kg/m   GEN: A/Ox3; pleasant , NAD, well nourished    HEENT:  [place]/AT,  EACs-clear, TMs-wnl, NOSE-clear, THROAT-clear, no lesions, no postnasal drip or exudate noted.   NECK:  Supple w/ fair ROM; no JVD; normal carotid impulses w/o bruits; no thyromegaly or nodules palpated; no lymphadenopathy.    RESP  Clear  P & A; w/o, wheezes/ rales/ or rhonchi. no accessory muscle use, no dullness to percussion  CARD:  RRR, no m/r/g, no peripheral edema, pulses intact, no cyanosis or clubbing.  GI:   Soft & nt; nml bowel  sounds; no organomegaly or masses detected.   Musco: Warm bil, no deformities or joint swelling noted.   Neuro: alert, no focal deficits noted.    Skin: Warm, no lesions or rashes    Lab Results:  CBC    Component Value Date/Time   WBC 5.8 03/04/2017 0000   RBC 5.18 03/04/2017 0000   HGB 15.7 03/04/2017 0000   HCT 47.7 03/04/2017 0000   PLT 207 03/04/2017 0000   MCV 92 03/04/2017 0000   MCH 30.3 03/04/2017 0000   MCHC 32.9 03/04/2017 0000   RDW 13.9 03/04/2017 0000    BMET  Component Value Date/Time   NA 141 03/18/2017 0541   NA 143 03/04/2017 0000   K 4.2 03/18/2017 0541   CL 109 03/18/2017 0541   CO2 26 03/18/2017 0541   GLUCOSE 105 (H) 03/18/2017 0541   BUN 18 03/18/2017 0541   BUN 17 03/04/2017 0000   CREATININE 0.89 03/18/2017 0541   CALCIUM 8.6 (L) 03/18/2017 0541   GFRNONAA >60 03/18/2017 0541   GFRAA >60 03/18/2017 0541    BNP No results found for: BNP  ProBNP No results found for: PROBNP  Imaging: Ct Chest High Resolution  Result Date: 03/05/2017 CLINICAL DATA:  Dyspnea on exertion for several months. Bibasilar crackles. Wheezing. EXAM: CT CHEST WITHOUT CONTRAST TECHNIQUE: Multidetector CT imaging of the chest was performed following the standard protocol without intravenous contrast. High resolution imaging of the lungs, as well as inspiratory and expiratory imaging, was performed. COMPARISON:  01/02/2017 chest CT. FINDINGS: Cardiovascular: Normal heart size. No significant pericardial fluid/thickening. Left main, left anterior descending, left circumflex and right coronary atherosclerosis. Atherosclerotic nonaneurysmal thoracic aorta. Normal caliber pulmonary arteries. Mediastinum/Nodes: No discrete thyroid nodules. Unremarkable esophagus. No axillary adenopathy. Enlarged 1.4 cm right lower paratracheal node (series 4/ image 64), stable since 01/02/2017. No additional pathologically enlarged mediastinal or gross hilar nodes on this noncontrast scan.  Lungs/Pleura: No pneumothorax. No pleural effusion. No acute consolidative airspace disease, lung masses or significant pulmonary nodules. Mildly thickened parenchymal bands with associated mild distortion and volume loss are noted in the right middle lobe, lingula and right greater than left lower lobes, stable since 01/02/2017, compatible with postinfectious/ postinflammatory scarring. No significant regions of subpleural reticulation, ground-glass attenuation, traction bronchiectasis or frank honeycombing. No significant air trapping on the expiration sequence. Upper abdomen: Unremarkable. Musculoskeletal: No aggressive appearing focal osseous lesions. Marked thoracic spondylosis. IMPRESSION: 1. No evidence of interstitial lung disease. 2. Stable bibasilar parenchymal banding, compatible with postinfectious/postinflammatory scarring. 3. Stable mild right lower paratracheal adenopathy, for which 2 month stability has been demonstrated, suggesting benign reactive adenopathy. 4. 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 Aortic Atherosclerosis (ICD10-I70.0). Electronically Signed   By: Ilona Sorrel M.D.   On: 03/05/2017 08:19     Assessment & Plan:   No problem-specific Assessment & Plan notes found for this encounter.     Rexene Edison, NP 03/19/2017

## 2017-03-19 NOTE — Patient Instructions (Signed)
Continue on current regimen  Follow up with Dr. Chase Caller in 3-4 months and As needed

## 2017-03-19 NOTE — Telephone Encounter (Signed)
Follow up      Calling to let the doctor know that he has not heard from Dr Vivi Martens office regarding his bypass surgery

## 2017-03-19 NOTE — Telephone Encounter (Signed)
Spoke with pt, aware according to his chart he has an appointment 03-27-17 with dr Cyndia Bent. Office address and telephone number given to the patient.

## 2017-03-20 ENCOUNTER — Ambulatory Visit: Payer: Medicare Other | Admitting: Cardiology

## 2017-03-21 NOTE — Assessment & Plan Note (Signed)
DOE w/ unrevealing pulmonary workup  PFT are essentially normal with no significant airflow obstruction .  HRCT chest w/ no ILD , nothing to explain his dyspnea.  It appears this may be a cardiac source as Cath has reviewed severe LAD dz.  He has follow up with TS for evaluation for CABG .   Plan  Patient Instructions  Continue on current regimen  Follow up with Dr. Chase Caller in 3-4 months and As needed

## 2017-03-27 ENCOUNTER — Institutional Professional Consult (permissible substitution) (INDEPENDENT_AMBULATORY_CARE_PROVIDER_SITE_OTHER): Payer: Medicare Other | Admitting: Surgery

## 2017-03-27 ENCOUNTER — Other Ambulatory Visit: Payer: Self-pay | Admitting: *Deleted

## 2017-03-27 ENCOUNTER — Encounter: Payer: Self-pay | Admitting: Surgery

## 2017-03-27 ENCOUNTER — Ambulatory Visit: Payer: Medicare Other | Admitting: Cardiology

## 2017-03-27 VITALS — BP 117/60 | HR 68 | Resp 16 | Ht 67.0 in | Wt 186.0 lb

## 2017-03-27 DIAGNOSIS — I25118 Atherosclerotic heart disease of native coronary artery with other forms of angina pectoris: Secondary | ICD-10-CM

## 2017-03-27 DIAGNOSIS — I2510 Atherosclerotic heart disease of native coronary artery without angina pectoris: Secondary | ICD-10-CM

## 2017-04-01 ENCOUNTER — Other Ambulatory Visit (HOSPITAL_COMMUNITY): Payer: Medicare Other

## 2017-04-01 NOTE — Pre-Procedure Instructions (Addendum)
Jordan [name]  04/01/2017      [id number] - [place], [place] - [place] [address]  [place] [address] Phone: [phone] Fax: [phone]    Your procedure is scheduled on 04/04/17  Report to [facility] Admitting at 530 A.M.  Call this number if you have problems the morning of surgery:  [phone]   Remember:  Do not eat food or drink liquids after midnight.  Take these medicines the morning of surgery with A SIP OF WATER     Metoprolol(lopressor),ranitidine(zantac),aspirin per dr  Bridgette Habermann all herbel meds, nsaids (aleve,naproxen,advil,ibuprofen)   prior to surgery starting Today 98/14/18) including all vitamins/supplements, fish oil  Aspirin per dr   Lazaro Arms not wear jewelry, make-up or nail polish.  Do not wear lotions, powders, or perfumes, or deoderant.  Do not shave 48 hours prior to surgery.  Men may shave face and neck.  Do not bring valuables to the hospital.  [facility] is not responsible for any belongings or valuables.  Contacts, dentures or bridgework may not be worn into surgery.  Leave your suitcase in the car.  After surgery it may be brought to your room.  For patients admitted to the hospital, discharge time will be determined by your treatment team.  Patients discharged the day of surgery will not be allowed to drive home.   Special instructions:   Special Instructions: [place] - Preparing for Surgery  Before surgery, you can play an important role.  Because skin is not sterile, your skin needs to be as free of germs as possible.  You can reduce the number of germs on you skin by washing with CHG (chlorahexidine gluconate) soap before surgery.  CHG is an antiseptic cleaner which kills germs and bonds with the skin to continue killing germs even after washing.  Please DO NOT use if you have an allergy to CHG or antibacterial soaps.  If your skin becomes reddened/irritated stop  using the CHG and inform your nurse when you arrive at Short Stay.  Do not shave (including legs and underarms) for at least 48 hours prior to the first CHG shower.  You may shave your face.  Please follow these instructions carefully:   1.  Shower with CHG Soap the night before surgery and the morning of Surgery.  2.  If you choose to wash your hair, wash your hair first as usual with your normal shampoo.  3.  After you shampoo, rinse your hair and body thoroughly to remove the Shampoo.  4.  Use CHG as you would any other liquid soap.  You can apply chg directly  to the skin and wash gently with scrungie or a clean washcloth.  5.  Apply the CHG Soap to your body ONLY FROM THE NECK DOWN.  Do not use on open wounds or open sores.  Avoid contact with your eyes ears, mouth and genitals (private parts).  Wash genitals (private parts)       with your normal soap.  6.  Wash thoroughly, paying special attention to the area where your surgery will be performed.  7.  Thoroughly rinse your body with warm water from the neck down.  8.  DO NOT shower/wash with your normal soap after using and rinsing off the CHG Soap.  9.  Pat yourself dry with a clean towel.            10.  Wear  clean pajamas.            11.  Place clean sheets on your bed the night of your first shower and do not sleep with pets.  Day of Surgery  Do not apply any lotions/deodorants the morning of surgery.  Please wear clean clothes to the hospital/surgery center.  Please read over the  fact sheets that you were given.

## 2017-04-02 ENCOUNTER — Encounter (HOSPITAL_COMMUNITY): Payer: Self-pay

## 2017-04-02 ENCOUNTER — Ambulatory Visit (HOSPITAL_COMMUNITY)
Admission: RE | Admit: 2017-04-02 | Discharge: 2017-04-02 | Disposition: A | Discharge status: Home / Self Care | Payer: Medicare Other | Source: Ambulatory Visit | Attending: Surgery | Admitting: Surgery

## 2017-04-02 ENCOUNTER — Encounter (HOSPITAL_COMMUNITY)
Admission: RE | Admit: 2017-04-02 | Discharge: 2017-04-02 | Disposition: A | Discharge status: Home / Self Care | Payer: Medicare Other | Source: Ambulatory Visit | Attending: Surgery | Admitting: Surgery

## 2017-04-02 ENCOUNTER — Ambulatory Visit (HOSPITAL_BASED_OUTPATIENT_CLINIC_OR_DEPARTMENT_OTHER)
Admission: RE | Admit: 2017-04-02 | Discharge: 2017-04-02 | Disposition: A | Discharge status: Home / Self Care | Payer: Medicare Other | Source: Ambulatory Visit | Attending: Surgery | Admitting: Surgery

## 2017-04-02 ENCOUNTER — Encounter: Payer: Self-pay | Admitting: Surgery

## 2017-04-02 DIAGNOSIS — I6523 Occlusion and stenosis of bilateral carotid arteries: Secondary | ICD-10-CM

## 2017-04-02 DIAGNOSIS — R590 Localized enlarged lymph nodes: Secondary | ICD-10-CM | POA: Insufficient documentation

## 2017-04-02 DIAGNOSIS — Z01812 Encounter for preprocedural laboratory examination: Secondary | ICD-10-CM | POA: Insufficient documentation

## 2017-04-02 DIAGNOSIS — I739 Peripheral vascular disease, unspecified: Secondary | ICD-10-CM | POA: Diagnosis not present

## 2017-04-02 DIAGNOSIS — Z96651 Presence of right artificial knee joint: Secondary | ICD-10-CM | POA: Diagnosis not present

## 2017-04-02 DIAGNOSIS — I2584 Coronary atherosclerosis due to calcified coronary lesion: Secondary | ICD-10-CM | POA: Diagnosis not present

## 2017-04-02 DIAGNOSIS — J9811 Atelectasis: Secondary | ICD-10-CM

## 2017-04-02 DIAGNOSIS — I700 Atherosclerosis of aorta: Secondary | ICD-10-CM | POA: Insufficient documentation

## 2017-04-02 DIAGNOSIS — I25118 Atherosclerotic heart disease of native coronary artery with other forms of angina pectoris: Secondary | ICD-10-CM | POA: Diagnosis not present

## 2017-04-02 DIAGNOSIS — K219 Gastro-esophageal reflux disease without esophagitis: Secondary | ICD-10-CM | POA: Diagnosis not present

## 2017-04-02 DIAGNOSIS — K449 Diaphragmatic hernia without obstruction or gangrene: Secondary | ICD-10-CM | POA: Diagnosis not present

## 2017-04-02 DIAGNOSIS — I10 Essential (primary) hypertension: Secondary | ICD-10-CM | POA: Diagnosis not present

## 2017-04-02 DIAGNOSIS — G43B0 Ophthalmoplegic migraine, not intractable: Secondary | ICD-10-CM | POA: Diagnosis not present

## 2017-04-02 DIAGNOSIS — Z01810 Encounter for preprocedural cardiovascular examination: Secondary | ICD-10-CM

## 2017-04-02 DIAGNOSIS — J984 Other disorders of lung: Secondary | ICD-10-CM

## 2017-04-02 DIAGNOSIS — H8109 Meniere's disease, unspecified ear: Secondary | ICD-10-CM | POA: Diagnosis not present

## 2017-04-02 DIAGNOSIS — E785 Hyperlipidemia, unspecified: Secondary | ICD-10-CM | POA: Diagnosis not present

## 2017-04-02 DIAGNOSIS — R001 Bradycardia, unspecified: Secondary | ICD-10-CM | POA: Insufficient documentation

## 2017-04-02 DIAGNOSIS — I2510 Atherosclerotic heart disease of native coronary artery without angina pectoris: Secondary | ICD-10-CM | POA: Diagnosis not present

## 2017-04-02 DIAGNOSIS — Z974 Presence of external hearing-aid: Secondary | ICD-10-CM | POA: Diagnosis not present

## 2017-04-02 DIAGNOSIS — Z87442 Personal history of urinary calculi: Secondary | ICD-10-CM | POA: Diagnosis not present

## 2017-04-02 HISTORY — DX: Personal history of other diseases of the digestive system: Z87.19

## 2017-04-02 HISTORY — DX: Personal history of urinary calculi: Z87.442

## 2017-04-02 HISTORY — DX: Dyspnea, unspecified: R06.00

## 2017-04-02 LAB — VAS US DOPPLER PRE CABG
LCCAPDIAS: 25 cm/s
LEFT ECA DIAS: -10 cm/s
LEFT VERTEBRAL DIAS: -12 cm/s
LICAPDIAS: -20 cm/s
Left CCA dist dias: -24 cm/s
Left CCA dist sys: -88 cm/s
Left CCA prox sys: 121 cm/s
Left ICA dist dias: -22 cm/s
Left ICA dist sys: -68 cm/s
Left ICA prox sys: -55 cm/s
RCCAPDIAS: 21 cm/s
RIGHT ECA DIAS: -11 cm/s
RIGHT VERTEBRAL DIAS: -21 cm/s
Right CCA prox sys: 111 cm/s
Right cca dist sys: -82 cm/s

## 2017-04-02 LAB — BLOOD GAS, ARTERIAL
ACID-BASE EXCESS: 1.4 mmol/L (ref 0.0–2.0)
Bicarbonate: 24.7 mmol/L (ref 20.0–28.0)
DRAWN BY: 470591
FIO2: 21
O2 SAT: 97.8 %
PH ART: 7.477 — AB (ref 7.350–7.450)
Patient temperature: 98.6
pCO2 arterial: 33.8 mmHg (ref 32.0–48.0)
pO2, Arterial: 95.6 mmHg (ref 83.0–108.0)

## 2017-04-02 LAB — CBC
HCT: 41 % (ref 39.0–52.0)
Hemoglobin: 14.5 g/dL (ref 13.0–17.0)
MCH: 32.3 pg (ref 26.0–34.0)
MCHC: 35.4 g/dL (ref 30.0–36.0)
MCV: 91.3 fL (ref 78.0–100.0)
PLATELETS: 209 10*3/uL (ref 150–400)
RBC: 4.49 MIL/uL (ref 4.22–5.81)
RDW: 13.3 % (ref 11.5–15.5)
WBC: 5.9 10*3/uL (ref 4.0–10.5)

## 2017-04-02 LAB — COMPREHENSIVE METABOLIC PANEL
ALT: 16 U/L — ABNORMAL LOW (ref 17–63)
AST: 19 U/L (ref 15–41)
Albumin: 3.5 g/dL (ref 3.5–5.0)
Alkaline Phosphatase: 65 U/L (ref 38–126)
Anion gap: 9 (ref 5–15)
BUN: 17 mg/dL (ref 6–20)
CHLORIDE: 109 mmol/L (ref 101–111)
CO2: 21 mmol/L — AB (ref 22–32)
Calcium: 9 mg/dL (ref 8.9–10.3)
Creatinine, Ser: 0.8 mg/dL (ref 0.61–1.24)
GFR calc Af Amer: >60 mL/min (ref >60)
GFR calc non Af Amer: >60 mL/min (ref >60)
GLUCOSE: 100 mg/dL — AB (ref 65–99)
POTASSIUM: 4.5 mmol/L (ref 3.5–5.1)
Sodium: 139 mmol/L (ref 135–145)
Total Bilirubin: 0.6 mg/dL (ref 0.3–1.2)
Total Protein: 5.9 g/dL — ABNORMAL LOW (ref 6.5–8.1)

## 2017-04-02 LAB — SURGICAL PCR SCREEN
MRSA, PCR: NEGATIVE
STAPHYLOCOCCUS AUREUS: NEGATIVE

## 2017-04-02 LAB — URINALYSIS, ROUTINE W REFLEX MICROSCOPIC
Bacteria, UA: NONE SEEN
Bilirubin Urine: NEGATIVE
GLUCOSE, UA: NEGATIVE mg/dL
KETONES UR: NEGATIVE mg/dL
LEUKOCYTES UA: NEGATIVE
Nitrite: NEGATIVE
PH: 7 (ref 5.0–8.0)
Protein, ur: NEGATIVE mg/dL
SQUAMOUS EPITHELIAL / LPF: NONE SEEN
Specific Gravity, Urine: 1.015 (ref 1.005–1.030)

## 2017-04-02 LAB — TYPE AND SCREEN
ABO/RH(D): A POS
ANTIBODY SCREEN: NEGATIVE

## 2017-04-02 LAB — APTT: aPTT: 51 seconds — ABNORMAL HIGH (ref 24–36)

## 2017-04-02 LAB — ABO/RH: ABO/RH(D): A POS

## 2017-04-02 LAB — PROTIME-INR
INR: 0.98
PROTHROMBIN TIME: 13 s (ref 11.4–15.2)

## 2017-04-02 IMAGING — CR DG CHEST 2V
2 series · 2 of 2 positions shown · non-contrast
Comparison: [DATE] CT.

CLINICAL DATA: 76-year-old male with coronary artery disease pre
bypass. Nonsmoker. Subsequent encounter.

EXAM:
CHEST  2 VIEW

[w chest pa]
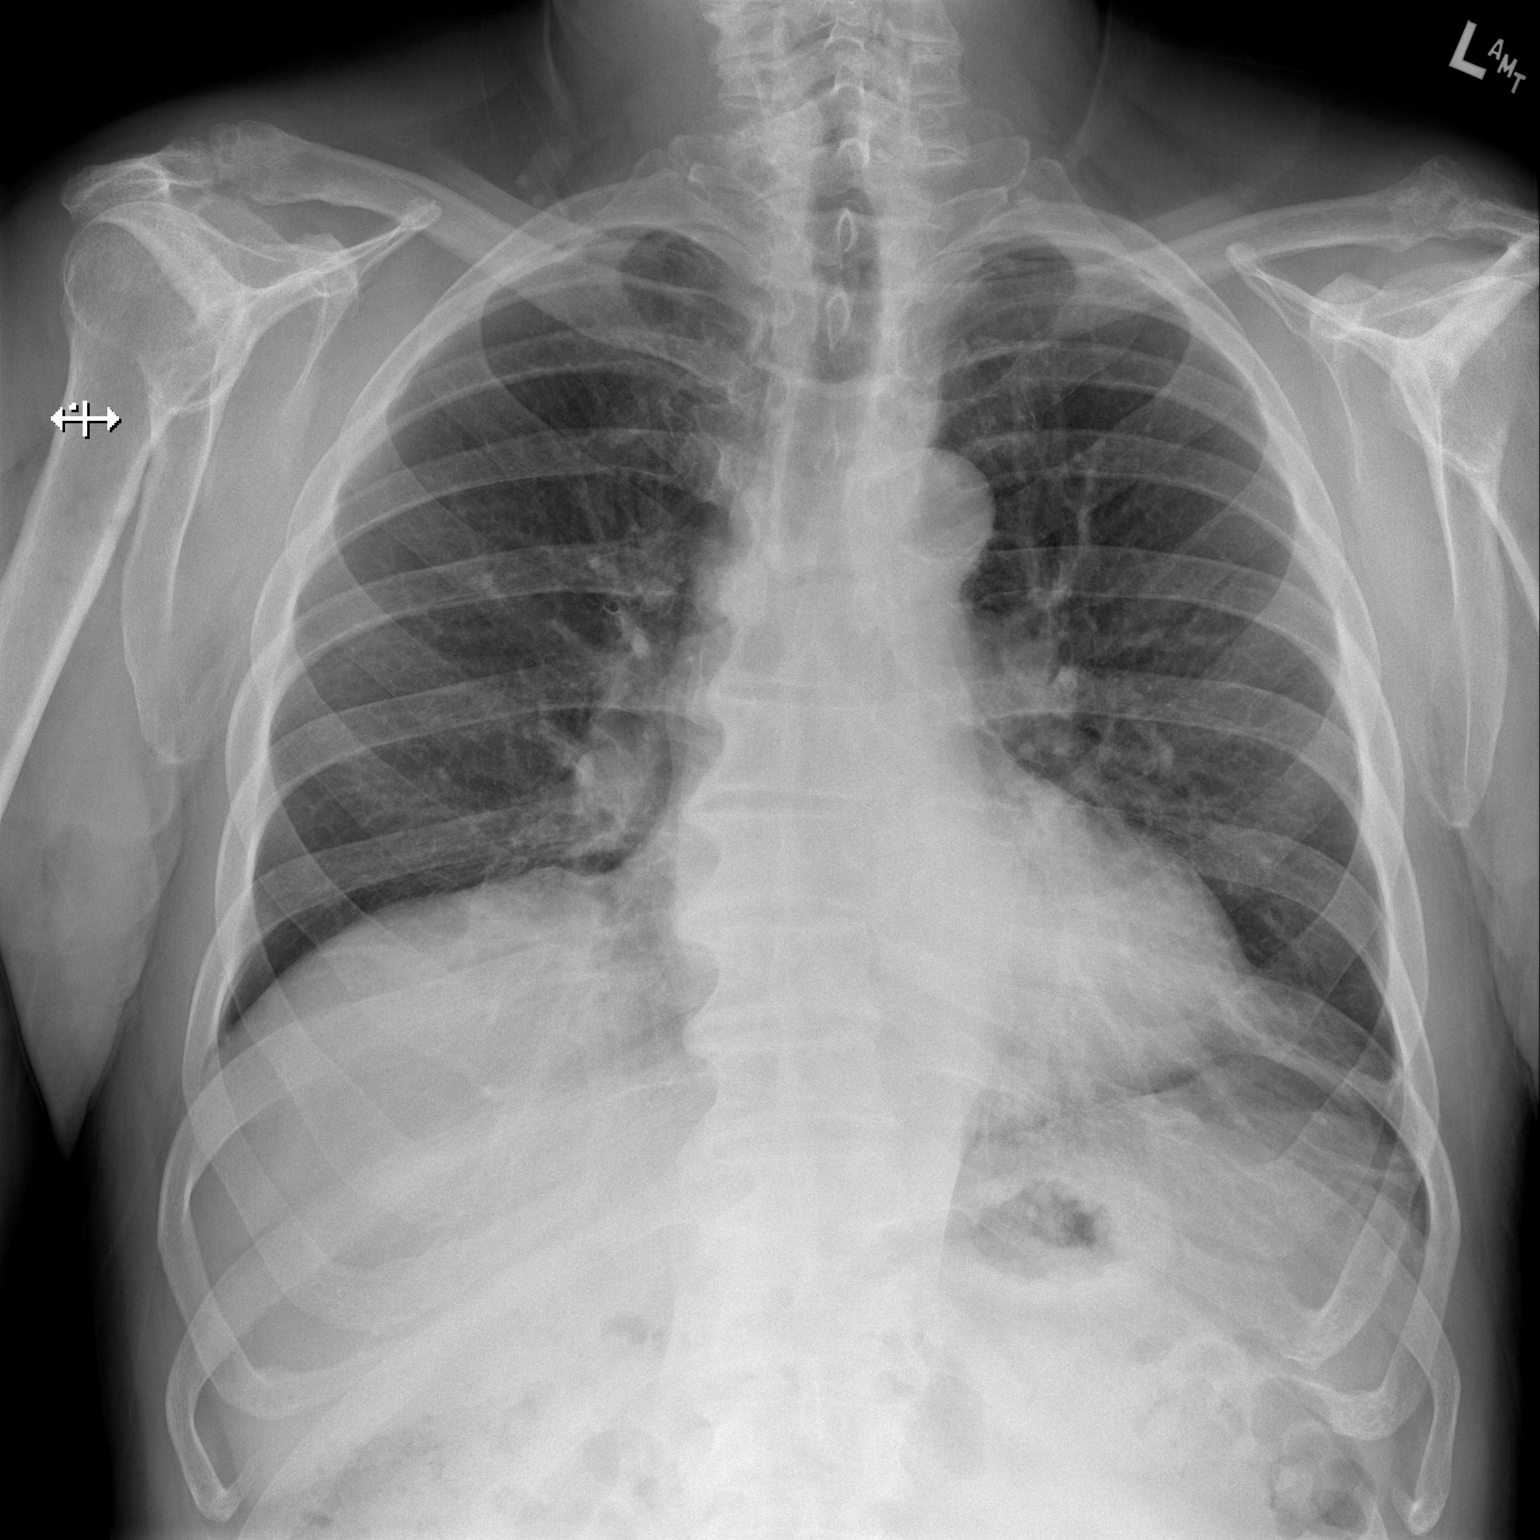

[w chest lat]
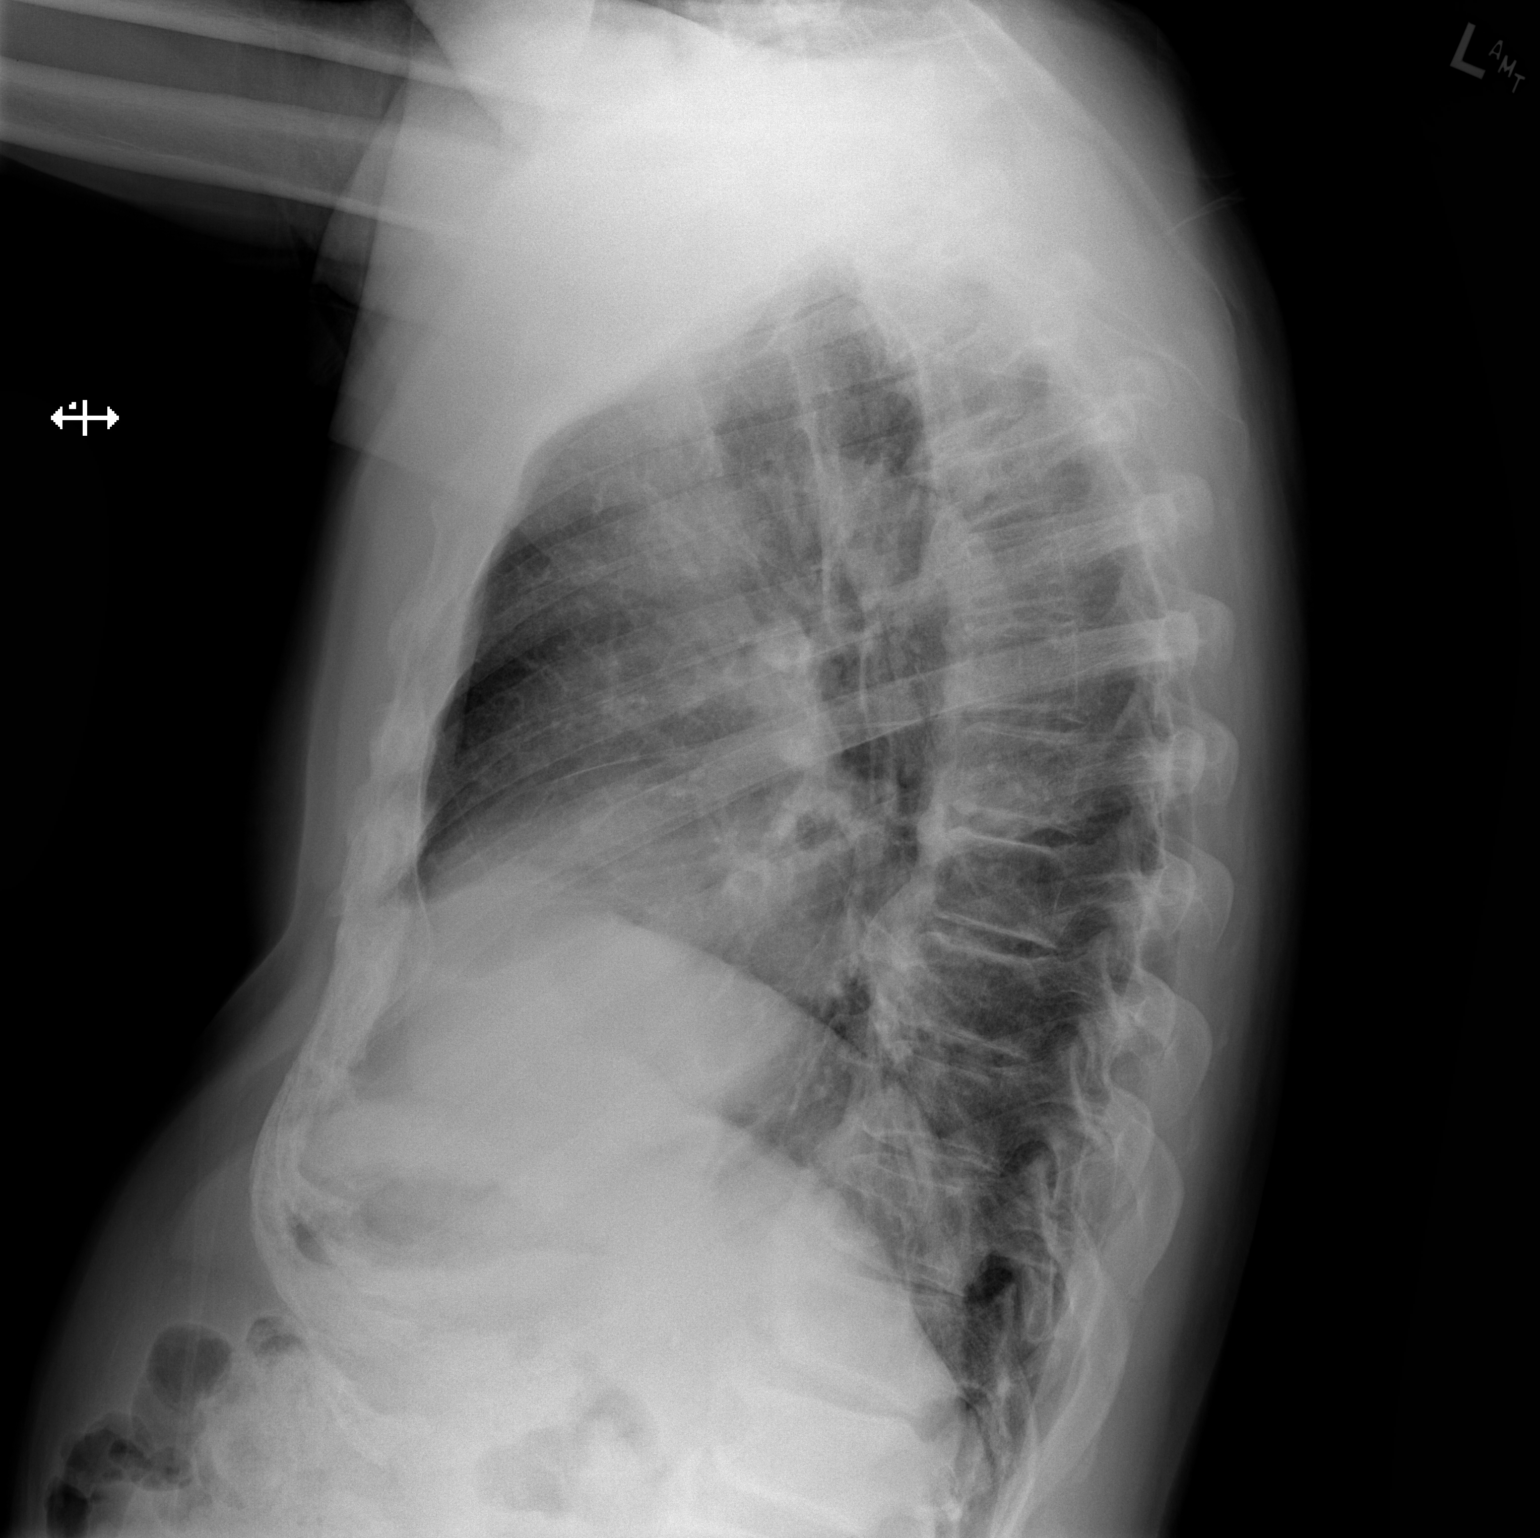

[2 of 2 positions shown; findings below may reference images not displayed]

FINDINGS: Elevated right hemidiaphragm.

Bibasilar scarring/subsegmental atelectasis.

Central pulmonary vascular prominence without frank pulmonary edema.

Minimal peribronchial thickening appears chronic.

No segmental infiltrate or pneumothorax.

CT detected pretracheal adenopathy not assessed by the present exam.

Heart size top-normal.

Calcified aorta.

Degenerative changes acromioclavicular joint. Mild thoracic spine
degenerative changes.
IMPRESSION: Bibasilar scarring/subsegmental atelectasis as noted on prior CT.

Central pulmonary vascular prominence without pulmonary edema.

CT detected pretracheal adenopathy not assessed by the present exam.

Aortic Atherosclerosis ([0B]-[0B]).

## 2017-04-02 NOTE — Progress Notes (Signed)
Pre-op Cardiac Surgery  Carotid Findings:  Findings consistent with a 1- 39 percent stenosis involving the right internal carotid artery and the left internal carotid artery.  Large amount of shadowing plaque obscuring the proximal left internal carotid artery could underestimate the degree of stenosis.  Upper Extremity Right Left  Brachial Pressures 125, Tri 124, Tri  Radial Waveforms Tri Tri  Ulnar Waveforms Tri Tri  Palmar Arch (Allen's Test) waveform increases less than 50% with radial compression and reverses with ulnar compression. waveform increases less than 50% with radial compression and reverses with ulnar compression.   Lower  Extremity Right Left  Dorsalis Pedis 150, Tri 150, Tri  Posterior Tibial 149, Tri 157, Tri  Ankle/Brachial Indices 1.2 1.2   Lita Mains- RDMS, RVT 11:51 AM  04/02/2017

## 2017-04-02 NOTE — Progress Notes (Signed)
PCP is Crist Infante, MD Referring Provider is Leonie Man, MD  Chief Complaint  Patient presents with  . Coronary Artery Disease    CATH 03/18/17, PFT 03/19/17, STRESS 02/12/17    HPI:  The patient is a 76 year old gentleman with a history of hyperlipidemia, and hypertension who presented to Dr. Joylene Draft in June with complaints of shortness of breath with exertion such as going up hills. 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 He has had some exertional fatigue but no chest discomfort. He had an intermediate risk nuclear stress on 02/12/2017 with a small, moderate intensity reversible defect in the mid to distal anterior wall suggesting ischemia with an abnormal exercise ECG. Cardiac cath on 03/18/2017 showed a distal LM stenosis estimated at 55% which I think is probably a little worse. The LAD has 75% proximal and mid vessel stenoses. The RCA has diffuse non-obstructive disease. LVEF is 55-65%. He was also seen by pulmonary medicine about the same time and his PFT's were essentially normal with a mild diffusion defect with a DLCO of 71%  Past Medical History:  Diagnosis Date  . Coronary artery disease   . DDD (degenerative disc disease)   . Dyspnea   . ED (erectile dysfunction)   . GERD (gastroesophageal reflux disease)   . History of hiatal hernia   . History of kidney stones   . HTN (hypertension)   . Hyperlipidemia   . Impaired fasting glucose   . Knee pain    bilateral  . Meniere's disease   . Meniere's vertigo   . Mild carotid artery disease ([place])   . Ocular migraine   . Shoulder pain   . Wears hearing aid     Past Surgical History:  Procedure Laterality Date  . Cardiac Stress Test  2008   Normal  . CARPAL TUNNEL RELEASE Left 1998  . CARPAL TUNNEL RELEASE Right 2008  . HERNIA REPAIR Left    inguinal  . HIATAL HERNIA REPAIR     no surgery per pt  . KNEE CARTILAGE SURGERY Bilateral    . LEFT HEART CATH AND CORONARY ANGIOGRAPHY N/A 03/18/2017   Procedure: Left Heart Cath and Coronary Angiography;  Surgeon: Leonie Man, MD;  Location: [facility];  Service: Cardiovascular;  Laterality: N/A;  . MEDIAL PARTIAL KNEE REPLACEMENT Right 2010   Partial right knee replacement  . ROTATOR CUFF REPAIR Left 2008  . SHOULDER ARTHROSCOPY W/ ROTATOR CUFF REPAIR Right 06/15/2016  . TONSILLECTOMY      Family History  Problem Relation Age of Onset  . Heart failure Mother   . Cancer Mother        type unknown  . Coronary artery disease Father        CABG  . Heart attack Father        Long-term smoker.  Marland Kitchen Heart disease Father   . Hyperlipidemia Father   . Hyperlipidemia Brother   . Hypertension Brother   . Hyperlipidemia Son     Social History Social History  Substance Use Topics  . Smoking status: Never Smoker  . Smokeless tobacco: Never Used  . Alcohol use 9.6 oz/week    14 Glasses of wine, 2 Shots of liquor per week    Current Outpatient Prescriptions  Medication Sig Dispense Refill  . Calcium Citrate-Vitamin D (CITRACAL MAXIMUM) 315-250 MG-UNIT TABS  Take 1 tablet by mouth 2 (two) times daily.     . cholecalciferol (VITAMIN D) 1000 UNITS tablet Take 1,000 Units by mouth 2 (two) times daily.     . Cyanocobalamin (VITAMIN B-12 PO) Take 1 tablet by mouth every evening.     . diphenhydramine-acetaminophen (TYLENOL PM) 25-500 MG TABS tablet Take 0.5 tablets by mouth at bedtime as needed (for sleep.).    Marland Kitchen ezetimibe (ZETIA) 10 MG tablet Take 10 mg by mouth every evening.     . metoprolol tartrate (LOPRESSOR) 25 MG tablet Take 0.5 tablets (12.5 mg total) by mouth 2 (two) times daily. 45 tablet 2  . Omega-3 Fatty Acids (FISH OIL) 1000 MG CAPS Take 1,000 mg by mouth 2 (two) times daily.     . Pitavastatin Calcium 1 MG TABS Take 1 tablet (1 mg total) by mouth at bedtime. 30 tablet 6  . ranitidine (ZANTAC) 150 MG tablet Take 150 mg by mouth 2 (two) times daily.    .  tadalafil (CIALIS) 20 MG tablet Take 10 mg by mouth daily as needed for erectile dysfunction.     . valsartan (DIOVAN) 80 MG tablet Take 80 mg by mouth at bedtime.     Marland Kitchen aspirin EC 81 MG tablet Take 1 tablet (81 mg total) by mouth daily. (Patient taking differently: Take 81 mg by mouth at bedtime. ) 90 tablet 3  . Menthol, Topical Analgesic, (BIOFREEZE EX) Apply 1 application topically 3 (three) times daily as needed (for shoulder/lower back pain.).    Marland Kitchen Multiple Vitamins-Minerals (CENTRUM SILVER PO) Take 1 tablet by mouth daily.    . Multiple Vitamins-Minerals (PROTEGRA PO) Take 1 capsule by mouth daily.    . naproxen sodium (ANAPROX) 220 MG tablet Take 220 mg by mouth 2 (two) times daily with a meal.     No current facility-administered medications for this visit.     Allergies  Allergen Reactions  . Crestor [Rosuvastatin Calcium] Other (See Comments)    Muscle aches     Review of Systems  Constitutional: Positive for activity change and fatigue. Negative for appetite change, diaphoresis, fever and unexpected weight change.  HENT: Negative.   Respiratory: Positive for shortness of breath.   Cardiovascular: Negative for chest pain, palpitations and leg swelling.       Some chest tightness with exertion  Gastrointestinal:       Reflux  Endocrine: Negative.   Genitourinary: Positive for frequency.  Musculoskeletal: Negative.   Skin: Negative.   Allergic/Immunologic: Negative.   Neurological: Negative.   Hematological: Bruises/bleeds easily.  Psychiatric/Behavioral: Negative.     BP 117/60 (BP Location: Right Arm, Patient Position: Sitting, Cuff Size: Large)   Pulse 68   Resp 16   Ht _0  (1.702 m)   Wt 186 lb (84.4 kg)   SpO2 93% Comment: ON RA  BMI 29.13 kg/m  Physical Exam  Constitutional: He is oriented to person, place, and time. He appears well-developed and well-nourished. No distress.  HENT:  Head: Normocephalic and atraumatic.  Mouth/Throat: Oropharynx is clear  and moist.  Eyes: Pupils are equal, round, and reactive to light. Conjunctivae and EOM are normal.  Neck: Normal range of motion. Neck supple. No JVD present. No thyromegaly present.  Cardiovascular: Normal rate, regular rhythm and intact distal pulses.   No murmur heard. Pulmonary/Chest: Effort normal and breath sounds normal. No respiratory distress.  Abdominal: Soft. Bowel sounds are normal. He exhibits no distension and no mass. There is no tenderness.  Musculoskeletal:  Normal range of motion. He exhibits no edema.  Lymphadenopathy:    He has no cervical adenopathy.  Neurological: He is alert and oriented to person, place, and time. He has normal strength. No cranial nerve deficit or sensory deficit.  Skin: Skin is warm and dry.  Psychiatric: He has a normal mood and affect.     Diagnostic Tests:  Physicians   Panel Physicians Referring Physician Case Authorizing Physician  Leonie Man, MD (Primary)    Procedures   Left Heart Cath and Coronary Angiography  Conclusion     LM lesion, 55 %stenosed.  Prox LAD to Mid LAD lesion, 75 %stenosed. Mid-Distal LAD lesion, 75 %stenosed.  Moderate, non-obstructive m-dRCA disease.  The left ventricular systolic function is normal. The left ventricular ejection fraction is 55-65% by visual estimate.  LV end diastolic pressure is normal.  The presence of the radial arterial loop made catheter exchange difficult, would not recommend radial catheterizations in the future.   Severe disease involving the left main coronary artery and proximal to mid LAD. Based on the distal left main disease and calcified LAD disease, he is better served with CABG.  Plan:  Okay to discharge home is really been relatively asymptomatic at rest  We will arrange outpatient CT surgical consultation  Continue statin and ARB along with aspirin.   He has not been on beta blocker - will initiate perioperative beta blocker.    Glenetta Hew,  M.D., M.S. Interventional Cardiologist   Pager # [phone] Phone # [phone] [address] [place], [place] [address]    Indications   Abnormal nuclear stress test [R94.39 (ICD-10-CM)]  Exertional dyspnea [R06.09 (ICD-10-CM)]  Chest pain with high risk for cardiac etiology [R07.9 (ICD-10-CM)]  Procedural Details/Technique   Technical Details PCP: Crist Infante, MD CARDIOLOGIST: Glenetta Hew, MD  R D Loughner was initially seen on June 11 with complaint of progressively worsening exertional dyspnea -- most notably dyspnea walking up hill or up stairs. As this was a relatively new symptom for him, he was evaluated with a Myoview ST. He now presents to discuss the results of the Intermediate Risk Test with an anterior perfusion defect & grossly abnormal TM ST portion. He has been relatively asymptomatic with routine exercise level, only noticing it with going upstairs, or up steps. Has not had any further chest pain. He now presents for diagnostic cardiac catheterization.  Time Out: Verified patient identification, verified procedure, site/side was marked, verified correct patient position, special equipment/implants available, medications/allergies/relevent history reviewed, required imaging and test results available. Performed. Consent Signed.   Access:  * RIGHT Radial Artery: 6 Fr sheath -- Seldinger technique using Angiocath Micropuncture Kit; under direct ULTRASOUND GUIDANCE * 10 mL radial cocktail IA; 4000 Units IV Heparin  Left Heart Catheterization: 5 Fr Catheters advanced or exchanged over a J-wire under direct fluoroscopic guidance into the ascending aorta; TIG 4.0 catheter advanced first. -> The patient has a radiolucent making it difficult to wire. A Versicore wire was used to advance the catheter, exchanges were done over the J-wire. The presence of the radial arterial loop made catheter exchange difficult, I would not recommend radial catheterizations in  the future. * Left Coronary Artery Cineangiography: JL 3.5 Catheter  * Right Coronary Artery Cineangiography: TIG 4.0 Catheter  * LV Hemodynamics (LV Gram): Angled pigtail catheter  - After completion of angiography, the catheter was removed completely out of the body over wire without complication.  Radial Sheath(s) removed in the Cath Lab with TR  band placed for hemostasis.   TR Band: 0840 Hours; 12 mL air  MEDICATIONS * SQ Lidocaine 56m * Radial Cocktail: 3 mg Verapamil in 10 mL NS plus an additional 2 mg in 10 mL NS  * Isovue Contrast: 110 mL * Heparin: 4000 Units   Fluoroscopy time: 9.3 minutes min Radiation dose: 388 mGy   Estimated blood loss <50 mL.  During this procedure the patient was administered the following to achieve and maintain moderate conscious sedation: Versed 2 mg, Fentanyl 50 mcg, while the patient's heart rate, blood pressure, and oxygen saturation were continuously monitored. The period of conscious sedation was 60 minutes, of which I was present face-to-face 100% of this time.    Complications   Complications documented before study signed (03/18/2017 9:04 AM EDT)    No complications were associated with this study.  Documented by HLeonie Man MD - 03/18/2017 9:04 AM EDT    Coronary Findings   Dominance: Right  Left Main  Vessel is large.  LM lesion, 55% stenosed. The lesion is focal, discrete and concentric. The lesion is moderately calcified.  Left Anterior Descending  Prox LAD to Mid LAD lesion, 75% stenosed. The lesion is eccentric and irregular. The lesion is moderately calcified.  Dist LAD lesion, 75% stenosed. The lesion is focal, discrete, eccentric and irregular.  First Diagonal Branch  Vessel is moderate in size. Vessel is angiographically normal.  First Septal Branch  Vessel is small in size.  Second Diagonal Branch  Vessel is moderate in size. Vessel is angiographically normal.  Second Septal Branch  Vessel is small in size.    Third Diagonal Branch  Vessel is moderate in size. Vessel is angiographically normal.  Third Septal Branch  Vessel is small in size. Vessel is angiographically normal.  Left Circumflex  Vessel is moderate in size.  Ost Cx to Mid Cx lesion, 25% stenosed. The lesion is mildly calcified.  First Obtuse Marginal Branch  Vessel is small in size.  Lateral Second Obtuse Marginal Branch  Vessel is small in size.  Right Coronary Artery  Vessel is large. There is mild diffuse disease throughout the vessel. The vessel is moderately calcified.  Prox RCA lesion, 35% stenosed. The lesion is eccentric and smooth. The lesion is moderately calcified.  Mid RCA lesion, 40% stenosed. The lesion is focal.  Dist RCA lesion, 40% stenosed. The lesion is calcified.  Acute Marginal Branch  Vessel is small in size.  Right Posterior Descending Artery  Vessel is moderate in size. The vessel exhibits minimal luminal irregularities.  Inferior Septal  Vessel is small in size.  Right Posterior Atrioventricular Branch  Vessel is moderate in size. There is mild disease in the vessel.  First Right Posterolateral  Vessel is small in size.  Second Right Posterolateral  Vessel is small in size. There is mild disease in the vessel.  Wall Motion              Left Heart   Left Ventricle The left ventricular size is normal. The left ventricular systolic function is normal. LV end diastolic pressure is normal. The left ventricular ejection fraction is 55-65% by visual estimate. No regional wall motion abnormalities. There is no evidence of mitral regurgitation.    Aortic Valve There is no aortic valve stenosis. There is normal aortic valve motion.    Coronary Diagrams   Diagnostic Diagram       Implants     No implant documentation for this case.  PACS Images  Show images for Cardiac catheterization   Link to Procedure Log   Procedure Log    Hemo Data    Most Recent Value  AO Systolic Pressure 548  mmHg  AO Diastolic Pressure 59 mmHg  AO Mean 87 mmHg  LV Systolic Pressure 628 mmHg  LV Diastolic Pressure 3 mmHg  LV EDP 19 mmHg  Arterial Occlusion Pressure Extended Systolic Pressure 241 mmHg  Arterial Occlusion Pressure Extended Diastolic Pressure 62 mmHg  Arterial Occlusion Pressure Extended Mean Pressure 90 mmHg  Left Ventricular Apex Extended Systolic Pressure 753 mmHg  Left Ventricular Apex Extended Diastolic Pressure 3 mmHg  Left Ventricular Apex Extended EDP Pressure 17 mmHg     Impression:  This 76 year old gentleman has significant distal LM and LAD stenoses with an abnormal nuclear stress test and progressive exertional shortness of breath, fatigue and chest pressure that is limiting his ability to be active. I agree that CABG is the best treatment for him. I reviewed the cath films with him and answered his questions. I discussed the operative procedure with the patient and his wife including alternatives, benefits and risks; including but not limited to bleeding, blood transfusion, infection, stroke, myocardial infarction, graft failure, heart block requiring a permanent pacemaker, organ dysfunction, and death.  R D Drohan understands and agrees to proceed.    Plan:  CABG on Thursday 04/04/2017   I spent 60 minutes performing this consultation and > 50% of this time was spent face to face counseling and coordinating the care of this patient's coronary artery disease.   Gaye Pollack, MD [facility] [phone]

## 2017-04-03 LAB — HEMOGLOBIN A1C
HEMOGLOBIN A1C: 5.7 % — AB (ref 4.8–5.6)
MEAN PLASMA GLUCOSE: 117 mg/dL

## 2017-04-03 MED ORDER — SODIUM CHLORIDE 0.9 % IV SOLN
INTRAVENOUS | Status: DC
  Filled 2017-04-03: qty 30

## 2017-04-03 MED ORDER — TRANEXAMIC ACID (OHS) PUMP PRIME SOLUTION
2.0000 mg/kg | INTRAVENOUS | Status: DC
  Filled 2017-04-03: qty 1.67

## 2017-04-03 MED ORDER — SODIUM CHLORIDE 0.9 % IV SOLN
30.0000 ug/min | INTRAVENOUS | Status: DC
  Filled 2017-04-03: qty 2

## 2017-04-03 MED ORDER — TRANEXAMIC ACID 1000 MG/10ML IV SOLN
1.5000 mg/kg/h | INTRAVENOUS | Status: AC
  Administered 2017-04-04: 1.5 mg/kg/h via INTRAVENOUS
  Filled 2017-04-03: qty 25

## 2017-04-03 MED ORDER — SODIUM CHLORIDE 0.9 % IV SOLN
INTRAVENOUS | Status: AC
  Administered 2017-04-04: 1 [IU]/h via INTRAVENOUS
  Filled 2017-04-03: qty 1

## 2017-04-03 MED ORDER — DEXTROSE 5 % IV SOLN
1.5000 g | INTRAVENOUS | Status: AC
  Administered 2017-04-04: 1.5 g via INTRAVENOUS
  Administered 2017-04-04: .75 g via INTRAVENOUS
  Filled 2017-04-03 (×2): qty 1.5

## 2017-04-03 MED ORDER — EPINEPHRINE PF 1 MG/ML IJ SOLN
0.0000 ug/min | INTRAMUSCULAR | Status: DC
  Filled 2017-04-03: qty 4

## 2017-04-03 MED ORDER — PLASMA-LYTE 148 IV SOLN
INTRAVENOUS | Status: AC
  Administered 2017-04-04: 500 mL
  Filled 2017-04-03: qty 2.5

## 2017-04-03 MED ORDER — NITROGLYCERIN IN D5W 200-5 MCG/ML-% IV SOLN
2.0000 ug/min | INTRAVENOUS | Status: DC
  Filled 2017-04-03: qty 250

## 2017-04-03 MED ORDER — TRANEXAMIC ACID (OHS) BOLUS VIA INFUSION
15.0000 mg/kg | INTRAVENOUS | Status: AC
  Administered 2017-04-04: 1252.5 mg via INTRAVENOUS
  Filled 2017-04-03: qty 1253

## 2017-04-03 MED ORDER — POTASSIUM CHLORIDE 2 MEQ/ML IV SOLN
80.0000 meq | INTRAVENOUS | Status: DC
  Filled 2017-04-03: qty 40

## 2017-04-03 MED ORDER — MAGNESIUM SULFATE 50 % IJ SOLN
40.0000 meq | INTRAMUSCULAR | Status: DC
  Filled 2017-04-03: qty 10

## 2017-04-03 MED ORDER — DOPAMINE-DEXTROSE 3.2-5 MG/ML-% IV SOLN
0.0000 ug/kg/min | INTRAVENOUS | Status: DC
  Filled 2017-04-03 (×2): qty 250

## 2017-04-03 MED ORDER — VANCOMYCIN HCL 10 G IV SOLR
1250.0000 mg | INTRAVENOUS | Status: AC
  Administered 2017-04-04: 1250 mg via INTRAVENOUS
  Filled 2017-04-03: qty 1250

## 2017-04-03 MED ORDER — DEXMEDETOMIDINE HCL IN NACL 400 MCG/100ML IV SOLN
0.1000 ug/kg/h | INTRAVENOUS | Status: AC
  Administered 2017-04-04: .3 ug/kg/h via INTRAVENOUS
  Filled 2017-04-03: qty 100

## 2017-04-03 MED ORDER — DEXTROSE 5 % IV SOLN
750.0000 mg | INTRAVENOUS | Status: DC
Start: 2017-04-04 — End: 2017-04-04
  Filled 2017-04-03: qty 750

## 2017-04-03 NOTE — H&P (Signed)
[address]       [place] [address]             [phone]      Cardiothoracic Surgery Admission History and Physical   PCP is Crist Infante, MD Referring Provider is Leonie Man, MD      Chief Complaint  Patient presents with  . Coronary Artery Disease        HPI:  The patient is a 76 year old gentleman with a history of hyperlipidemia, and hypertension who presented to Dr. Joylene Draft in June with complaints of shortness of breath with exertion such as going up hills. 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 He has had some exertional fatigue but no chest discomfort. He had an intermediate risk nuclear stress on 02/12/2017 with a small, moderate intensity reversible defect in the mid to distal anterior wall suggesting ischemia with an abnormal exercise ECG. Cardiac cath on 03/18/2017 showed a distal LM stenosis estimated at 55% which I think is probably a little worse. The LAD has 75% proximal and mid vessel stenoses. The RCA has diffuse non-obstructive disease. LVEF is 55-65%. He was also seen by pulmonary medicine about the same time and his PFT's were essentially normal with a mild diffusion defect with a DLCO of 71%      Past Medical History:  Diagnosis Date  . Coronary artery disease   . DDD (degenerative disc disease)   . Dyspnea   . ED (erectile dysfunction)   . GERD (gastroesophageal reflux disease)   . History of hiatal hernia   . History of kidney stones   . HTN (hypertension)   . Hyperlipidemia   . Impaired fasting glucose   . Knee pain    bilateral  . Meniere's disease   . Meniere's vertigo   . Mild carotid artery disease ([place])   . Ocular migraine   . Shoulder pain   . Wears hearing aid          Past Surgical History:  Procedure Laterality Date  . Cardiac Stress Test  2008   Normal  . CARPAL TUNNEL RELEASE Left 1998  .  CARPAL TUNNEL RELEASE Right 2008  . HERNIA REPAIR Left    inguinal  . HIATAL HERNIA REPAIR     no surgery per pt  . KNEE CARTILAGE SURGERY Bilateral   . LEFT HEART CATH AND CORONARY ANGIOGRAPHY N/A 03/18/2017   Procedure: Left Heart Cath and Coronary Angiography;  Surgeon: Leonie Man, MD;  Location: [facility];  Service: Cardiovascular;  Laterality: N/A;  . MEDIAL PARTIAL KNEE REPLACEMENT Right 2010   Partial right knee replacement  . ROTATOR CUFF REPAIR Left 2008  . SHOULDER ARTHROSCOPY W/ ROTATOR CUFF REPAIR Right 06/15/2016  . TONSILLECTOMY           Family History  Problem Relation Age of Onset  . Heart failure Mother   . Cancer Mother        type unknown  . Coronary artery disease Father        CABG  . Heart attack Father        Long-term smoker.  Marland Kitchen Heart disease Father   . Hyperlipidemia Father   . Hyperlipidemia Brother   . Hypertension Brother   . Hyperlipidemia Son     Social History  Social History   Substance Use Topics   . Smoking status: Never Smoker   . Smokeless tobacco: Never Used   . Alcohol use 9.6 oz/week    14 Glasses of wine, 2 Shots of liquor per week          Current Outpatient Prescriptions  Medication Sig Dispense Refill  . Calcium Citrate-Vitamin D (CITRACAL MAXIMUM) 315-250 MG-UNIT TABS Take 1 tablet by mouth 2 (two) times daily.     . cholecalciferol (VITAMIN D) 1000 UNITS tablet Take 1,000 Units by mouth 2 (two) times daily.     . Cyanocobalamin (VITAMIN B-12 PO) Take 1 tablet by mouth every evening.     . diphenhydramine-acetaminophen (TYLENOL PM) 25-500 MG TABS tablet Take 0.5 tablets by mouth at bedtime as needed (for sleep.).    Marland Kitchen ezetimibe (ZETIA) 10 MG tablet Take 10 mg by mouth every evening.     . metoprolol tartrate (LOPRESSOR) 25 MG tablet Take 0.5 tablets (12.5 mg total) by mouth 2 (two) times daily. 45 tablet 2  . Omega-3 Fatty Acids (FISH OIL) 1000 MG CAPS Take 1,000  mg by mouth 2 (two) times daily.     . Pitavastatin Calcium 1 MG TABS Take 1 tablet (1 mg total) by mouth at bedtime. 30 tablet 6  . ranitidine (ZANTAC) 150 MG tablet Take 150 mg by mouth 2 (two) times daily.    . tadalafil (CIALIS) 20 MG tablet Take 10 mg by mouth daily as needed for erectile dysfunction.     . valsartan (DIOVAN) 80 MG tablet Take 80 mg by mouth at bedtime.     Marland Kitchen aspirin EC 81 MG tablet Take 1 tablet (81 mg total) by mouth daily. (Patient taking differently: Take 81 mg by mouth at bedtime. ) 90 tablet 3  . Menthol, Topical Analgesic, (BIOFREEZE EX) Apply 1 application topically 3 (three) times daily as needed (for shoulder/lower back pain.).    Marland Kitchen Multiple Vitamins-Minerals (CENTRUM SILVER PO) Take 1 tablet by mouth daily.    . Multiple Vitamins-Minerals (PROTEGRA PO) Take 1 capsule by mouth daily.    . naproxen sodium (ANAPROX) 220 MG tablet Take 220 mg by mouth 2 (two) times daily with a meal.     No current facility-administered medications for this visit.          Allergies  Allergen Reactions  . Crestor [Rosuvastatin Calcium] Other (See Comments)    Muscle aches     Review of Systems  Constitutional: Positive for activity change and fatigue. Negative for appetite change, diaphoresis, fever and unexpected weight change.  HENT: Negative.   Respiratory: Positive for shortness of breath.   Cardiovascular: Negative for chest pain, palpitations and leg swelling.       Some chest tightness with exertion  Gastrointestinal:       Reflux  Endocrine: Negative.   Genitourinary: Positive for frequency.  Musculoskeletal: Negative.   Skin: Negative.   Allergic/Immunologic: Negative.   Neurological: Negative.   Hematological: Bruises/bleeds easily.  Psychiatric/Behavioral: Negative.     BP 117/60 (BP Location: Right Arm, Patient Position: Sitting, Cuff Size: Large)   Pulse 68   Resp 16   Ht 5' 7"  (1.702 m)   Wt 186 lb (84.4 kg)   SpO2 93%  Comment: ON RA  BMI 29.13 kg/m  Physical Exam  Constitutional: He is oriented to person, place, and time. He appears well-developed and well-nourished. No distress.  HENT:  Head: Normocephalic and atraumatic.  Mouth/Throat: Oropharynx is clear and moist.  Eyes: Pupils are equal, round, and reactive to light. Conjunctivae and EOM are normal.  Neck: Normal range of motion. Neck supple. No JVD present. No thyromegaly present.  Cardiovascular: Normal rate, regular rhythm and intact distal pulses.   No murmur heard. Pulmonary/Chest: Effort normal and breath sounds normal. No respiratory distress.  Abdominal: Soft. Bowel sounds are normal. He exhibits no distension and no mass. There is no tenderness.  Musculoskeletal: Normal range of motion. He exhibits no edema.  Lymphadenopathy:    He has no cervical adenopathy.  Neurological: He is alert and oriented to person, place, and time. He has normal strength. No cranial nerve deficit or sensory deficit.  Skin: Skin is warm and dry.  Psychiatric: He has a normal mood and affect.     Diagnostic Tests:  Physicians   Panel Physicians Referring Physician Case Authorizing Physician  Leonie Man, MD (Primary)    Procedures   Left Heart Cath and Coronary Angiography  Conclusion     LM lesion, 55 %stenosed.  Prox LAD to Mid LAD lesion, 75 %stenosed. Mid-Distal LAD lesion, 75 %stenosed.  Moderate, non-obstructive m-dRCA disease.  The left ventricular systolic function is normal. The left ventricular ejection fraction is 55-65% by visual estimate.  LV end diastolic pressure is normal.  The presence of the radial arterial loop made catheter exchange difficult, would not recommend radial catheterizations in the future.  Severe disease involving the left main coronary artery and proximal to mid LAD. Based on the distal left main disease and calcified LAD disease, he is better served with CABG.  Plan:  Okay to discharge  home is really been relatively asymptomatic at rest  We will arrange outpatient CT surgical consultation  Continue statin and ARB along with aspirin.   He has not been on beta blocker - will initiate perioperative beta blocker.    Glenetta Hew, M.D., M.S. Interventional Cardiologist   Pager # [phone] Phone # [phone] [address] [place], [place] [address]    Indications   Abnormal nuclear stress test [R94.39 (ICD-10-CM)]  Exertional dyspnea [R06.09 (ICD-10-CM)]  Chest pain with high risk for cardiac etiology [R07.9 (ICD-10-CM)]  Procedural Details/Technique   Technical Details PCP: Crist Infante, MD CARDIOLOGIST: Glenetta Hew, MD  R D Dobos was initially seen on June 11 with complaint of progressively worsening exertional dyspnea -- most notably dyspnea walking up hill or up stairs. As this was a relatively new symptom for him, he was evaluated with a Myoview ST. He now presents to discuss the results of the Intermediate Risk Test with an anterior perfusion defect & grossly abnormal TM ST portion. He has been relatively asymptomatic with routine exercise level, only noticing it with going upstairs, or up steps. Has not had any further chest pain. He now presents for diagnostic cardiac catheterization.  Time Out: Verified patient identification, verified procedure, site/side was marked, verified correct patient position, special equipment/implants available, medications/allergies/relevent history reviewed, required imaging and test results available. Performed. Consent Signed.   Access:  * RIGHT Radial Artery: 6 Fr sheath -- Seldinger technique using Angiocath Micropuncture Kit; under direct ULTRASOUND GUIDANCE * 10 mL radial cocktail IA; 4000 Units IV Heparin  Left Heart Catheterization: 5 Fr Catheters advanced or exchanged over a J-wire under direct fluoroscopic guidance into the ascending aorta; TIG 4.0 catheter advanced first. -> The patient  has a radiolucent making it difficult to wire. A Versicore wire was used to advance the catheter, exchanges were done over the J-wire. The presence of  the radial arterial loop made catheter exchange difficult, I would not recommend radial catheterizations in the future. * Left Coronary Artery Cineangiography: JL 3.5 Catheter  * Right Coronary Artery Cineangiography: TIG 4.0 Catheter  * LV Hemodynamics (LV Gram): Angled pigtail catheter  - After completion of angiography, the catheter was removed completely out of the body over wire without complication.  Radial Sheath(s) removed in the Cath Lab with TR band placed for hemostasis.   TR Band: 0840 Hours; 12 mL air  MEDICATIONS * SQ Lidocaine 83m * Radial Cocktail: 3 mg Verapamil in 10 mL NS plus an additional 2 mg in 10 mL NS  * Isovue Contrast: 110 mL * Heparin: 4000 Units   Fluoroscopy time: 9.3 minutes min Radiation dose: 388 mGy   Estimated blood loss <50 mL.  During this procedure the patient was administered the following to achieve and maintain moderate conscious sedation: Versed 2 mg, Fentanyl 50 mcg, while the patient's heart rate, blood pressure, and oxygen saturation were continuously monitored. The period of conscious sedation was 60 minutes, of which I was present face-to-face 100% of this time.    Complications   Complications documented before study signed (03/18/2017 9:04 AM EDT)    No complications were associated with this study.  Documented by HLeonie Man MD - 03/18/2017 9:04 AM EDT    Coronary Findings   Dominance: Right  Left Main  Vessel is large.  LM lesion, 55% stenosed. The lesion is focal, discrete and concentric. The lesion is moderately calcified.  Left Anterior Descending  Prox LAD to Mid LAD lesion, 75% stenosed. The lesion is eccentric and irregular. The lesion is moderately calcified.  Dist LAD lesion, 75% stenosed. The lesion is focal, discrete, eccentric and irregular.  First Diagonal  Branch  Vessel is moderate in size. Vessel is angiographically normal.  First Septal Branch  Vessel is small in size.  Second Diagonal Branch  Vessel is moderate in size. Vessel is angiographically normal.  Second Septal Branch  Vessel is small in size.  Third Diagonal Branch  Vessel is moderate in size. Vessel is angiographically normal.  Third Septal Branch  Vessel is small in size. Vessel is angiographically normal.  Left Circumflex  Vessel is moderate in size.  Ost Cx to Mid Cx lesion, 25% stenosed. The lesion is mildly calcified.  First Obtuse Marginal Branch  Vessel is small in size.  Lateral Second Obtuse Marginal Branch  Vessel is small in size.  Right Coronary Artery  Vessel is large. There is mild diffuse disease throughout the vessel. The vessel is moderately calcified.  Prox RCA lesion, 35% stenosed. The lesion is eccentric and smooth. The lesion is moderately calcified.  Mid RCA lesion, 40% stenosed. The lesion is focal.  Dist RCA lesion, 40% stenosed. The lesion is calcified.  Acute Marginal Branch  Vessel is small in size.  Right Posterior Descending Artery  Vessel is moderate in size. The vessel exhibits minimal luminal irregularities.  Inferior Septal  Vessel is small in size.  Right Posterior Atrioventricular Branch  Vessel is moderate in size. There is mild disease in the vessel.  First Right Posterolateral  Vessel is small in size.  Second Right Posterolateral  Vessel is small in size. There is mild disease in the vessel.  Wall Motion              Left Heart   Left Ventricle The left ventricular size is normal. The left ventricular systolic function is normal. LV end diastolic  pressure is normal. The left ventricular ejection fraction is 55-65% by visual estimate. No regional wall motion abnormalities. There is no evidence of mitral regurgitation.    Aortic Valve There is no aortic valve stenosis. There is normal aortic valve motion.      Coronary Diagrams   Diagnostic Diagram       Implants        No implant documentation for this case.  PACS Images   Show images for Cardiac catheterization   Link to Procedure Log   Procedure Log    Hemo Data    Most Recent Value  AO Systolic Pressure 161 mmHg  AO Diastolic Pressure 59 mmHg  AO Mean 87 mmHg  LV Systolic Pressure 096 mmHg  LV Diastolic Pressure 3 mmHg  LV EDP 19 mmHg  Arterial Occlusion Pressure Extended Systolic Pressure 045 mmHg  Arterial Occlusion Pressure Extended Diastolic Pressure 62 mmHg  Arterial Occlusion Pressure Extended Mean Pressure 90 mmHg  Left Ventricular Apex Extended Systolic Pressure 409 mmHg  Left Ventricular Apex Extended Diastolic Pressure 3 mmHg  Left Ventricular Apex Extended EDP Pressure 17 mmHg     Impression:  This 76 year old gentleman has significant distal LM and LAD stenoses with an abnormal nuclear stress test and progressive exertional shortness of breath, fatigue and chest pressure that is limiting his ability to be active. I agree that CABG is the best treatment for him. I reviewed the cath films with him and answered his questions. I discussed the operative procedure with the patient and his wife including alternatives, benefits and risks; including but not limited to bleeding, blood transfusion, infection, stroke, myocardial infarction, graft failure, heart block requiring a permanent pacemaker, organ dysfunction, and death.  R D Coupland understands and agrees to proceed.    Plan:  CABG    Gaye Pollack, MD [facility] [phone]

## 2017-04-03 NOTE — Progress Notes (Signed)
Anesthesia Chart Review:  Pt is a 76 year old male scheduled for CABG on 04/04/2017 with Gilford Raid, MD  PMH includes:  CAD, HTN, hyperlipidemia, impaired fasting glucose, Meniere's disease, GERD.  Never smoker. BMI 29  Medications include: ASA 81mg , zetia, metoprolol, pitavastatin, zantac, tadalafil, valsartan  Preoperative labs reviewed.   - PT normal, PTT 51. PAT RN notified Dr. Vivi Martens office of elevated PTT.  Will recheck DOS.   CXR 04/02/17:  - Bibasilar scarring/subsegmental atelectasis as noted on prior CT. - Central pulmonary vascular prominence without pulmonary edema. -CT detected pretracheal adenopathy not assessed by the present exam.  EKG 04/02/17: Sinus bradycardia (47 bpm) with 1st degree A-V block  Carotid duplex 04/02/17:  - Findings consistent with a 1- 39 percent stenosis involving the right internal carotid artery and the left internal carotid artery. - Large amount of shadowing plaque obscuring the proximal left internal carotid artery could underestimate the degree of stenosis.  Cardiac cath 03/18/17:   LM lesion, 55 %stenosed.  Prox LAD to Mid LAD lesion, 75 %stenosed. Mid-Distal LAD lesion, 75 %stenosed.  Moderate, non-obstructive m-dRCA disease.  The left ventricular systolic function is normal. The left ventricular ejection fraction is 55-65% by visual estimate.  LV end diastolic pressure is normal.  The presence of the radial arterial loop made catheter exchange difficult, would not recommend radial catheterizations in the future.  - Severe disease involving the left main coronary artery and proximal to mid LAD. - Based on the distal left main disease and calcified LAD disease, he is better served with CABG.  If PTT acceptable DOS and no other changes, I anticipate pt can proceed as scheduled.   Willeen Cass, FNP-BC [facility]/Anesthesiology Phone: [phone] 04/03/2017 10:22 AM

## 2017-04-04 ENCOUNTER — Inpatient Hospital Stay (HOSPITAL_COMMUNITY): Payer: Medicare Other

## 2017-04-04 ENCOUNTER — Inpatient Hospital Stay (HOSPITAL_COMMUNITY)
Admission: RE | Admit: 2017-04-04 | Discharge: 2017-04-08 | DRG: 236 | Disposition: A | Discharge status: Home / Self Care | Payer: Medicare Other | Source: Ambulatory Visit | Attending: Surgery | Admitting: Surgery

## 2017-04-04 ENCOUNTER — Inpatient Hospital Stay (HOSPITAL_COMMUNITY): Payer: Medicare Other | Admitting: Certified Registered Nurse Anesthetist

## 2017-04-04 ENCOUNTER — Encounter (HOSPITAL_COMMUNITY): Payer: Self-pay | Admitting: *Deleted

## 2017-04-04 ENCOUNTER — Encounter (HOSPITAL_COMMUNITY)
Admission: RE | Disposition: A | Discharge status: Home / Self Care | Payer: Self-pay | Source: Ambulatory Visit | Attending: Surgery

## 2017-04-04 ENCOUNTER — Inpatient Hospital Stay (HOSPITAL_COMMUNITY): Payer: Medicare Other | Admitting: Emergency Medicine

## 2017-04-04 DIAGNOSIS — Z7982 Long term (current) use of aspirin: Secondary | ICD-10-CM

## 2017-04-04 DIAGNOSIS — D696 Thrombocytopenia, unspecified: Secondary | ICD-10-CM | POA: Diagnosis present

## 2017-04-04 DIAGNOSIS — Z974 Presence of external hearing-aid: Secondary | ICD-10-CM

## 2017-04-04 DIAGNOSIS — H8109 Meniere's disease, unspecified ear: Secondary | ICD-10-CM | POA: Diagnosis present

## 2017-04-04 DIAGNOSIS — Z809 Family history of malignant neoplasm, unspecified: Secondary | ICD-10-CM | POA: Diagnosis not present

## 2017-04-04 DIAGNOSIS — Z96651 Presence of right artificial knee joint: Secondary | ICD-10-CM | POA: Diagnosis present

## 2017-04-04 DIAGNOSIS — E785 Hyperlipidemia, unspecified: Secondary | ICD-10-CM | POA: Diagnosis present

## 2017-04-04 DIAGNOSIS — I739 Peripheral vascular disease, unspecified: Secondary | ICD-10-CM | POA: Diagnosis present

## 2017-04-04 DIAGNOSIS — K449 Diaphragmatic hernia without obstruction or gangrene: Secondary | ICD-10-CM | POA: Diagnosis present

## 2017-04-04 DIAGNOSIS — R0602 Shortness of breath: Secondary | ICD-10-CM | POA: Diagnosis not present

## 2017-04-04 DIAGNOSIS — J9811 Atelectasis: Secondary | ICD-10-CM | POA: Diagnosis not present

## 2017-04-04 DIAGNOSIS — I2510 Atherosclerotic heart disease of native coronary artery without angina pectoris: Principal | ICD-10-CM | POA: Diagnosis present

## 2017-04-04 DIAGNOSIS — Z8249 Family history of ischemic heart disease and other diseases of the circulatory system: Secondary | ICD-10-CM

## 2017-04-04 DIAGNOSIS — Z791 Long term (current) use of non-steroidal anti-inflammatories (NSAID): Secondary | ICD-10-CM

## 2017-04-04 DIAGNOSIS — Z8349 Family history of other endocrine, nutritional and metabolic diseases: Secondary | ICD-10-CM | POA: Diagnosis not present

## 2017-04-04 DIAGNOSIS — J90 Pleural effusion, not elsewhere classified: Secondary | ICD-10-CM | POA: Diagnosis not present

## 2017-04-04 DIAGNOSIS — I2584 Coronary atherosclerosis due to calcified coronary lesion: Secondary | ICD-10-CM | POA: Diagnosis present

## 2017-04-04 DIAGNOSIS — I25118 Atherosclerotic heart disease of native coronary artery with other forms of angina pectoris: Secondary | ICD-10-CM

## 2017-04-04 DIAGNOSIS — G43B0 Ophthalmoplegic migraine, not intractable: Secondary | ICD-10-CM | POA: Diagnosis present

## 2017-04-04 DIAGNOSIS — K219 Gastro-esophageal reflux disease without esophagitis: Secondary | ICD-10-CM | POA: Diagnosis present

## 2017-04-04 DIAGNOSIS — I10 Essential (primary) hypertension: Secondary | ICD-10-CM | POA: Diagnosis present

## 2017-04-04 DIAGNOSIS — Z87442 Personal history of urinary calculi: Secondary | ICD-10-CM | POA: Diagnosis not present

## 2017-04-04 DIAGNOSIS — I340 Nonrheumatic mitral (valve) insufficiency: Secondary | ICD-10-CM | POA: Diagnosis not present

## 2017-04-04 DIAGNOSIS — Z888 Allergy status to other drugs, medicaments and biological substances status: Secondary | ICD-10-CM | POA: Diagnosis not present

## 2017-04-04 DIAGNOSIS — Z951 Presence of aortocoronary bypass graft: Secondary | ICD-10-CM

## 2017-04-04 DIAGNOSIS — Z452 Encounter for adjustment and management of vascular access device: Secondary | ICD-10-CM | POA: Diagnosis not present

## 2017-04-04 HISTORY — PX: TEE WITHOUT CARDIOVERSION: SHX5443

## 2017-04-04 HISTORY — PX: CORONARY ARTERY BYPASS GRAFT: SHX141

## 2017-04-04 HISTORY — DX: Presence of aortocoronary bypass graft: Z95.1

## 2017-04-04 LAB — GLUCOSE, CAPILLARY
GLUCOSE-CAPILLARY: 72 mg/dL (ref 65–99)
GLUCOSE-CAPILLARY: 114 mg/dL — AB (ref 65–99)
GLUCOSE-CAPILLARY: 130 mg/dL — AB (ref 65–99)
GLUCOSE-CAPILLARY: 125 mg/dL — AB (ref 65–99)
GLUCOSE-CAPILLARY: 173 mg/dL — AB (ref 65–99)
GLUCOSE-CAPILLARY: 142 mg/dL — AB (ref 65–99)
Glucose-Capillary: 148 mg/dL — ABNORMAL HIGH (ref 65–99)
Glucose-Capillary: 64 mg/dL — ABNORMAL LOW (ref 65–99)
Glucose-Capillary: 103 mg/dL — ABNORMAL HIGH (ref 65–99)
Glucose-Capillary: 96 mg/dL (ref 65–99)
Glucose-Capillary: 96 mg/dL (ref 65–99)

## 2017-04-04 LAB — POCT I-STAT, CHEM 8
BUN: 20 mg/dL (ref 6–20)
BUN: 19 mg/dL (ref 6–20)
BUN: 17 mg/dL (ref 6–20)
BUN: 18 mg/dL (ref 6–20)
BUN: 17 mg/dL (ref 6–20)
BUN: 18 mg/dL (ref 6–20)
BUN: 17 mg/dL (ref 6–20)
CHLORIDE: 104 mmol/L (ref 101–111)
CHLORIDE: 102 mmol/L (ref 101–111)
CHLORIDE: 102 mmol/L (ref 101–111)
CREATININE: 0.7 mg/dL (ref 0.61–1.24)
CREATININE: 0.7 mg/dL (ref 0.61–1.24)
CREATININE: 0.6 mg/dL — AB (ref 0.61–1.24)
CREATININE: 0.7 mg/dL (ref 0.61–1.24)
CREATININE: 0.8 mg/dL (ref 0.61–1.24)
CREATININE: 0.8 mg/dL (ref 0.61–1.24)
CREATININE: 0.9 mg/dL (ref 0.61–1.24)
Calcium, Ion: 1.24 mmol/L (ref 1.15–1.40)
Calcium, Ion: 1.18 mmol/L (ref 1.15–1.40)
Calcium, Ion: 0.98 mmol/L — ABNORMAL LOW (ref 1.15–1.40)
Calcium, Ion: 1.01 mmol/L — ABNORMAL LOW (ref 1.15–1.40)
Calcium, Ion: 1.06 mmol/L — ABNORMAL LOW (ref 1.15–1.40)
Calcium, Ion: 1.07 mmol/L — ABNORMAL LOW (ref 1.15–1.40)
Calcium, Ion: 1.14 mmol/L — ABNORMAL LOW (ref 1.15–1.40)
Chloride: 105 mmol/L (ref 101–111)
Chloride: 101 mmol/L (ref 101–111)
Chloride: 102 mmol/L (ref 101–111)
Chloride: 105 mmol/L (ref 101–111)
GLUCOSE: 110 mg/dL — AB (ref 65–99)
GLUCOSE: 107 mg/dL — AB (ref 65–99)
GLUCOSE: 121 mg/dL — AB (ref 65–99)
Glucose, Bld: 104 mg/dL — ABNORMAL HIGH (ref 65–99)
Glucose, Bld: 106 mg/dL — ABNORMAL HIGH (ref 65–99)
Glucose, Bld: 106 mg/dL — ABNORMAL HIGH (ref 65–99)
Glucose, Bld: 164 mg/dL — ABNORMAL HIGH (ref 65–99)
HCT: 26 % — ABNORMAL LOW (ref 39.0–52.0)
HCT: 28 % — ABNORMAL LOW (ref 39.0–52.0)
HCT: 25 % — ABNORMAL LOW (ref 39.0–52.0)
HCT: 29 % — ABNORMAL LOW (ref 39.0–52.0)
HEMATOCRIT: 35 % — AB (ref 39.0–52.0)
HEMATOCRIT: 34 % — AB (ref 39.0–52.0)
HEMATOCRIT: 34 % — AB (ref 39.0–52.0)
HEMOGLOBIN: 11.9 g/dL — AB (ref 13.0–17.0)
HEMOGLOBIN: 9.5 g/dL — AB (ref 13.0–17.0)
HEMOGLOBIN: 9.9 g/dL — AB (ref 13.0–17.0)
Hemoglobin: 11.6 g/dL — ABNORMAL LOW (ref 13.0–17.0)
Hemoglobin: 8.8 g/dL — ABNORMAL LOW (ref 13.0–17.0)
Hemoglobin: 8.5 g/dL — ABNORMAL LOW (ref 13.0–17.0)
Hemoglobin: 11.6 g/dL — ABNORMAL LOW (ref 13.0–17.0)
POTASSIUM: 4.2 mmol/L (ref 3.5–5.1)
POTASSIUM: 4.6 mmol/L (ref 3.5–5.1)
POTASSIUM: 3.9 mmol/L (ref 3.5–5.1)
POTASSIUM: 4.3 mmol/L (ref 3.5–5.1)
Potassium: 4.3 mmol/L (ref 3.5–5.1)
Potassium: 5.2 mmol/L — ABNORMAL HIGH (ref 3.5–5.1)
Potassium: 5 mmol/L (ref 3.5–5.1)
Sodium: 141 mmol/L (ref 135–145)
Sodium: 141 mmol/L (ref 135–145)
Sodium: 136 mmol/L (ref 135–145)
Sodium: 137 mmol/L (ref 135–145)
Sodium: 139 mmol/L (ref 135–145)
Sodium: 140 mmol/L (ref 135–145)
Sodium: 140 mmol/L (ref 135–145)
TCO2: 28 mmol/L (ref 0–100)
TCO2: 28 mmol/L (ref 0–100)
TCO2: 28 mmol/L (ref 0–100)
TCO2: 30 mmol/L (ref 0–100)
TCO2: 25 mmol/L (ref 0–100)
TCO2: 26 mmol/L (ref 0–100)
TCO2: 24 mmol/L (ref 0–100)

## 2017-04-04 LAB — POCT I-STAT 4, (NA,K, GLUC, HGB,HCT)
Glucose, Bld: 90 mg/dL (ref 65–99)
HCT: 30 % — ABNORMAL LOW (ref 39.0–52.0)
Hemoglobin: 10.2 g/dL — ABNORMAL LOW (ref 13.0–17.0)
POTASSIUM: 3.7 mmol/L (ref 3.5–5.1)
Sodium: 142 mmol/L (ref 135–145)

## 2017-04-04 LAB — POCT I-STAT 3, ART BLOOD GAS (G3+)
ACID-BASE EXCESS: 3 mmol/L — AB (ref 0.0–2.0)
ACID-BASE EXCESS: 6 mmol/L — AB (ref 0.0–2.0)
ACID-BASE EXCESS: 4 mmol/L — AB (ref 0.0–2.0)
ACID-BASE EXCESS: 3 mmol/L — AB (ref 0.0–2.0)
Acid-Base Excess: 1 mmol/L (ref 0.0–2.0)
Acid-base deficit: 2 mmol/L (ref 0.0–2.0)
BICARBONATE: 28.7 mmol/L — AB (ref 20.0–28.0)
BICARBONATE: 29.9 mmol/L — AB (ref 20.0–28.0)
BICARBONATE: 27.2 mmol/L (ref 20.0–28.0)
BICARBONATE: 27.5 mmol/L (ref 20.0–28.0)
BICARBONATE: 23.7 mmol/L (ref 20.0–28.0)
Bicarbonate: 25.5 mmol/L (ref 20.0–28.0)
Bicarbonate: 25.2 mmol/L (ref 20.0–28.0)
O2 SAT: 100 %
O2 SAT: 99 %
O2 SAT: 95 %
O2 SAT: 97 %
O2 Saturation: 100 %
O2 Saturation: 97 %
O2 Saturation: 97 %
PCO2 ART: 39.3 mmHg (ref 32.0–48.0)
PH ART: 7.484 — AB (ref 7.350–7.450)
PH ART: 7.417 (ref 7.350–7.450)
PO2 ART: 288 mmHg — AB (ref 83.0–108.0)
PO2 ART: 70 mmHg — AB (ref 83.0–108.0)
Patient temperature: 36.1
TCO2: 30 mmol/L (ref 0–100)
TCO2: 31 mmol/L (ref 0–100)
TCO2: 28 mmol/L (ref 0–100)
TCO2: 29 mmol/L (ref 0–100)
TCO2: 27 mmol/L (ref 0–100)
TCO2: 27 mmol/L (ref 0–100)
TCO2: 25 mmol/L (ref 0–100)
pCO2 arterial: 47.8 mmHg (ref 32.0–48.0)
pCO2 arterial: 39.5 mmHg (ref 32.0–48.0)
pCO2 arterial: 36.1 mmHg (ref 32.0–48.0)
pCO2 arterial: 43 mmHg (ref 32.0–48.0)
pCO2 arterial: 44.9 mmHg (ref 32.0–48.0)
pCO2 arterial: 42.9 mmHg (ref 32.0–48.0)
pH, Arterial: 7.388 (ref 7.350–7.450)
pH, Arterial: 7.487 — ABNORMAL HIGH (ref 7.350–7.450)
pH, Arterial: 7.414 (ref 7.350–7.450)
pH, Arterial: 7.36 (ref 7.350–7.450)
pH, Arterial: 7.355 (ref 7.350–7.450)
pO2, Arterial: 412 mmHg — ABNORMAL HIGH (ref 83.0–108.0)
pO2, Arterial: 88 mmHg (ref 83.0–108.0)
pO2, Arterial: 130 mmHg — ABNORMAL HIGH (ref 83.0–108.0)
pO2, Arterial: 103 mmHg (ref 83.0–108.0)
pO2, Arterial: 95 mmHg (ref 83.0–108.0)

## 2017-04-04 LAB — PROTIME-INR
INR: 1.31
Prothrombin Time: 16.4 seconds — ABNORMAL HIGH (ref 11.4–15.2)

## 2017-04-04 LAB — CBC
HEMATOCRIT: 34.2 % — AB (ref 39.0–52.0)
HEMATOCRIT: 36.9 % — AB (ref 39.0–52.0)
HEMOGLOBIN: 11.8 g/dL — AB (ref 13.0–17.0)
HEMOGLOBIN: 12.8 g/dL — AB (ref 13.0–17.0)
MCH: 31.1 pg (ref 26.0–34.0)
MCH: 31.4 pg (ref 26.0–34.0)
MCHC: 34.5 g/dL (ref 30.0–36.0)
MCHC: 34.7 g/dL (ref 30.0–36.0)
MCV: 90 fL (ref 78.0–100.0)
MCV: 90.4 fL (ref 78.0–100.0)
Platelets: 159 10*3/uL (ref 150–400)
Platelets: 194 10*3/uL (ref 150–400)
RBC: 3.8 MIL/uL — ABNORMAL LOW (ref 4.22–5.81)
RBC: 4.08 MIL/uL — AB (ref 4.22–5.81)
RDW: 12.8 % (ref 11.5–15.5)
RDW: 12.9 % (ref 11.5–15.5)
WBC: 9.4 10*3/uL (ref 4.0–10.5)
WBC: 11.7 10*3/uL — AB (ref 4.0–10.5)

## 2017-04-04 LAB — CREATININE, SERUM: Creatinine, Ser: 1.02 mg/dL (ref 0.61–1.24)

## 2017-04-04 LAB — MAGNESIUM: Magnesium: 3.3 mg/dL — ABNORMAL HIGH (ref 1.7–2.4)

## 2017-04-04 LAB — APTT
APTT: 44 s — AB (ref 24–36)
aPTT: 38 seconds — ABNORMAL HIGH (ref 24–36)

## 2017-04-04 LAB — HEMOGLOBIN AND HEMATOCRIT, BLOOD
HCT: 30.1 % — ABNORMAL LOW (ref 39.0–52.0)
HEMOGLOBIN: 10.6 g/dL — AB (ref 13.0–17.0)

## 2017-04-04 LAB — PLATELET COUNT: Platelets: 172 10*3/uL (ref 150–400)

## 2017-04-04 IMAGING — DX DG CHEST 1V PORT
1 series · 1 of 1 positions shown · non-contrast
Comparison: PA and lateral chest x-ray [DATE]

CLINICAL DATA: Status post CABG today.  Intubated patient.

EXAM:
PORTABLE CHEST 1 VIEW

[chest ap]
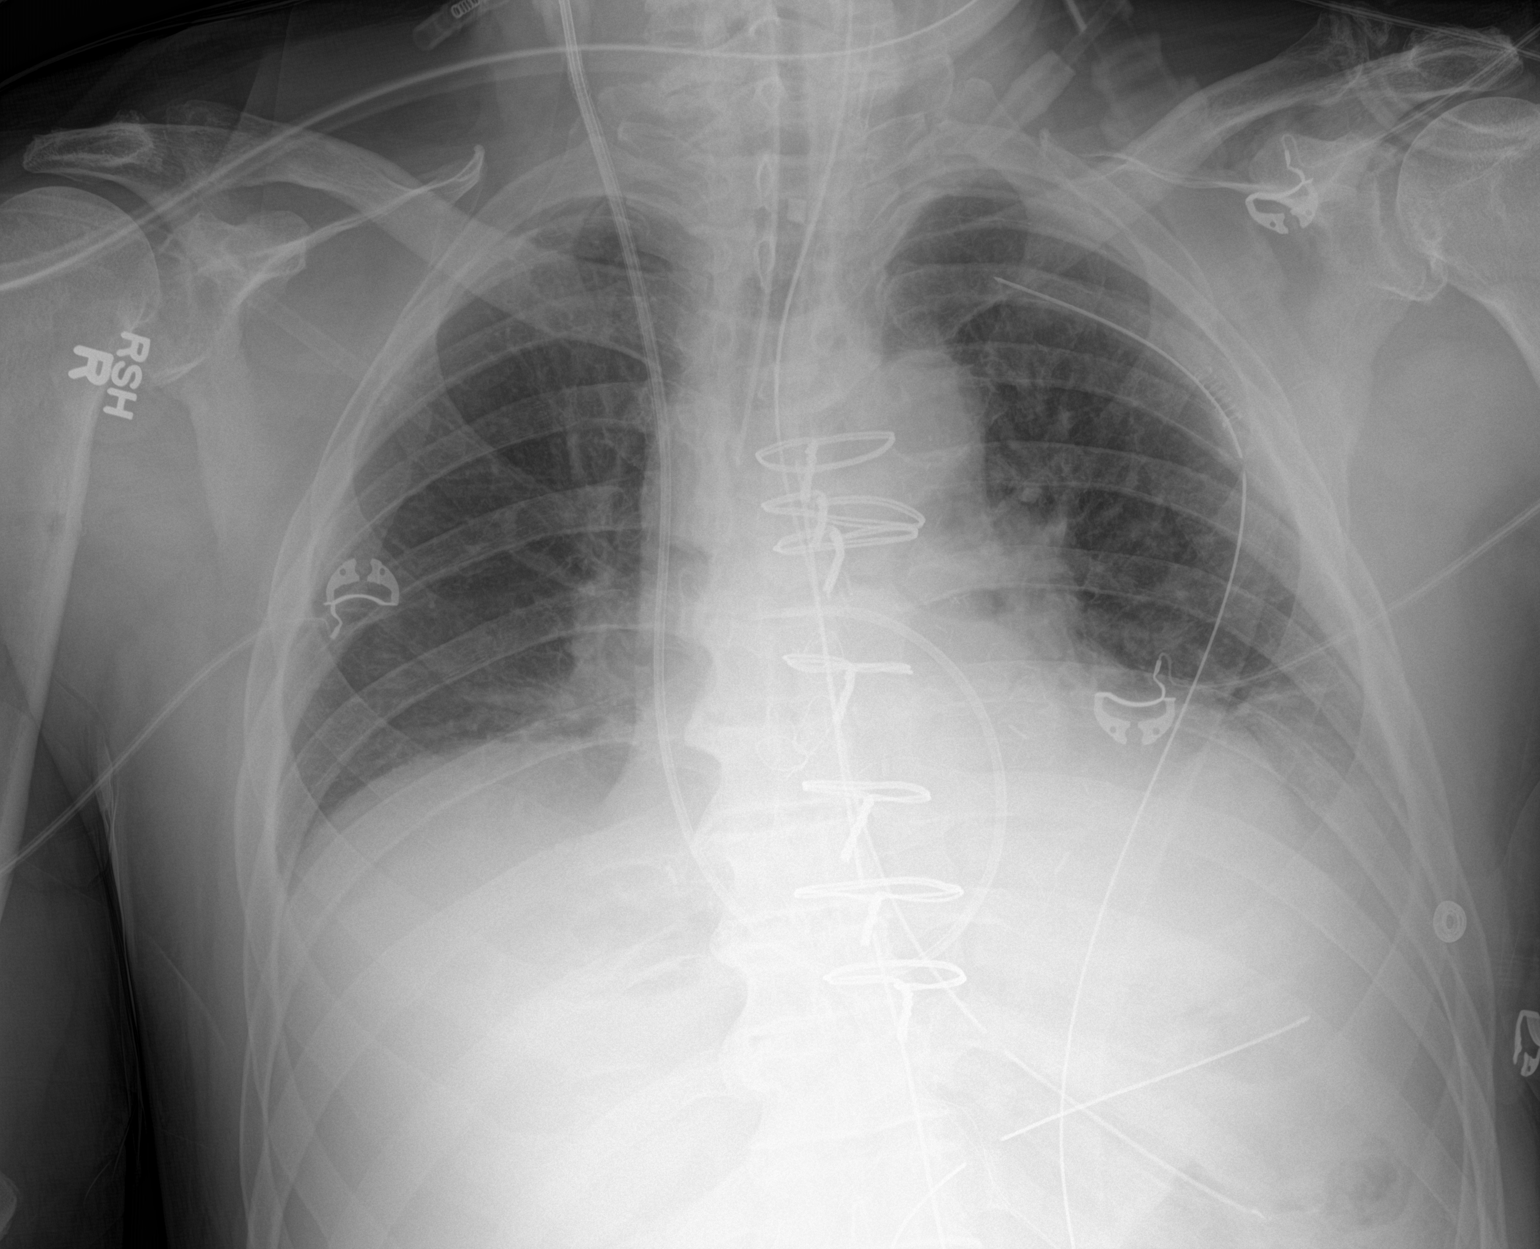

[1 of 1 positions shown; findings below may reference images not displayed]

FINDINGS: The lungs are hypoinflated. There is no pneumothorax or
pneumomediastinum. The retrocardiac region on the left is dense in
the left hemidiaphragm is obscured. The cardiac silhouette is
enlarged. The pulmonary vascularity is not engorged. The
endotracheal tube tip lies approximately 3.6 cm above the carina.
The Swan-Ganz catheter tip projects over the proximal right main
pulmonary artery. The esophagogastric tube tip in proximal port
project below the GE junction. The left upper chest tube projects
over the posteromedial aspect of the left fourth rib. The lower
chest tube projects just medial to the cardio phrenic gutter. The
mediastinal drain projects at approximately the T6 level. The
sternal wires are intact. There is calcification in the wall of the
aortic arch.
IMPRESSION: Post CABG changes. Mild hypoinflation. Left basilar atelectasis and
small left pleural effusion. No significant pulmonary edema.

The support tubes are in reasonable position.

## 2017-04-04 SURGERY — CORONARY ARTERY BYPASS GRAFTING (CABG)
Anesthesia: General | Site: Chest

## 2017-04-04 MED ORDER — SODIUM CHLORIDE 0.9 % IV SOLN
INTRAVENOUS | Status: DC
  Filled 2017-04-04: qty 1

## 2017-04-04 MED ORDER — BISACODYL 5 MG PO TBEC
10.0000 mg | DELAYED_RELEASE_TABLET | Freq: Every day | ORAL | Status: DC
  Administered 2017-04-05 – 2017-04-06 (×2): 10 mg via ORAL
  Filled 2017-04-04 (×2): qty 2

## 2017-04-04 MED ORDER — INSULIN ASPART 100 UNIT/ML ~~LOC~~ SOLN: 0.0000 [IU] | SUBCUTANEOUS | Status: DC

## 2017-04-04 MED ORDER — ASPIRIN 81 MG PO CHEW: 324.0000 mg | CHEWABLE_TABLET | Freq: Every day | ORAL | Status: DC

## 2017-04-04 MED ORDER — ACETAMINOPHEN 650 MG RE SUPP
650.0000 mg | Freq: Once | RECTAL | Status: AC
  Administered 2017-04-04: 650 mg via RECTAL

## 2017-04-04 MED ORDER — EPHEDRINE 5 MG/ML INJ
INTRAVENOUS | Status: AC
  Filled 2017-04-04: qty 10

## 2017-04-04 MED ORDER — DOCUSATE SODIUM 100 MG PO CAPS
200.0000 mg | ORAL_CAPSULE | Freq: Every day | ORAL | Status: DC
  Administered 2017-04-05 – 2017-04-06 (×2): 200 mg via ORAL
  Filled 2017-04-04 (×2): qty 2

## 2017-04-04 MED ORDER — VANCOMYCIN HCL IN DEXTROSE 1-5 GM/200ML-% IV SOLN
1000.0000 mg | Freq: Once | INTRAVENOUS | Status: AC
  Administered 2017-04-04: 1000 mg via INTRAVENOUS
  Filled 2017-04-04: qty 200

## 2017-04-04 MED ORDER — GLYCOPYRROLATE 0.2 MG/ML IJ SOLN
INTRAMUSCULAR | Status: DC | PRN
  Administered 2017-04-04 (×2): 0.2 mg via INTRAVENOUS

## 2017-04-04 MED ORDER — CHLORHEXIDINE GLUCONATE 0.12 % MT SOLN
15.0000 mL | Freq: Once | OROMUCOSAL | Status: AC
  Administered 2017-04-04: 15 mL via OROMUCOSAL

## 2017-04-04 MED ORDER — PROTAMINE SULFATE 10 MG/ML IV SOLN
INTRAVENOUS | Status: AC
  Filled 2017-04-04: qty 5

## 2017-04-04 MED ORDER — LIDOCAINE 2% (20 MG/ML) 5 ML SYRINGE
INTRAMUSCULAR | Status: AC
  Filled 2017-04-04: qty 5

## 2017-04-04 MED ORDER — SODIUM CHLORIDE 0.45 % IV SOLN
INTRAVENOUS | Status: DC | PRN
  Administered 2017-04-04: 14:00:00 via INTRAVENOUS

## 2017-04-04 MED ORDER — SODIUM CHLORIDE 0.9 % IV SOLN
INTRAVENOUS | Status: DC
  Administered 2017-04-04: 16:00:00 via INTRAVENOUS

## 2017-04-04 MED ORDER — SODIUM CHLORIDE 0.9% FLUSH: 10.0000 mL | INTRAVENOUS | Status: DC | PRN

## 2017-04-04 MED ORDER — METOPROLOL TARTRATE 5 MG/5ML IV SOLN: 2.5000 mg | INTRAVENOUS | Status: DC | PRN

## 2017-04-04 MED ORDER — CHLORHEXIDINE GLUCONATE 0.12% ORAL RINSE (MEDLINE KIT): 15.0000 mL | Freq: Two times a day (BID) | OROMUCOSAL | Status: DC

## 2017-04-04 MED ORDER — METOPROLOL TARTRATE 12.5 MG HALF TABLET: 12.5000 mg | ORAL_TABLET | Freq: Once | ORAL | Status: DC

## 2017-04-04 MED ORDER — PHENYLEPHRINE HCL 10 MG/ML IJ SOLN
INTRAMUSCULAR | Status: DC | PRN
  Administered 2017-04-04 (×2): 25 ug/min via INTRAVENOUS

## 2017-04-04 MED ORDER — LACTATED RINGERS IV SOLN: INTRAVENOUS | Status: DC | PRN

## 2017-04-04 MED ORDER — NITROGLYCERIN IN D5W 200-5 MCG/ML-% IV SOLN
0.0000 ug/min | INTRAVENOUS | Status: DC
  Administered 2017-04-04: 15 ug/min via INTRAVENOUS

## 2017-04-04 MED ORDER — ONDANSETRON HCL 4 MG/2ML IJ SOLN: 4.0000 mg | Freq: Four times a day (QID) | INTRAMUSCULAR | Status: DC | PRN

## 2017-04-04 MED ORDER — HEMOSTATIC AGENTS (NO CHARGE) OPTIME
TOPICAL | Status: DC | PRN
  Administered 2017-04-04: 1 via TOPICAL

## 2017-04-04 MED ORDER — 0.9 % SODIUM CHLORIDE (POUR BTL) OPTIME
TOPICAL | Status: DC | PRN
  Administered 2017-04-04: 6000 mL

## 2017-04-04 MED ORDER — MIDAZOLAM HCL 5 MG/5ML IJ SOLN
INTRAMUSCULAR | Status: DC | PRN
  Administered 2017-04-04: 2 mg via INTRAVENOUS
  Administered 2017-04-04: 4 mg via INTRAVENOUS
  Administered 2017-04-04 (×2): 2 mg via INTRAVENOUS

## 2017-04-04 MED ORDER — SODIUM CHLORIDE 0.9 % IV SOLN: 250.0000 mL | INTRAVENOUS | Status: DC

## 2017-04-04 MED ORDER — ALBUMIN HUMAN 5 % IV SOLN
250.0000 mL | INTRAVENOUS | Status: AC | PRN
  Administered 2017-04-04 – 2017-04-05 (×3): 250 mL via INTRAVENOUS
  Filled 2017-04-04 (×2): qty 250

## 2017-04-04 MED ORDER — LACTATED RINGERS IV SOLN
INTRAVENOUS | Status: DC
  Administered 2017-04-04 – 2017-04-05 (×2): via INTRAVENOUS

## 2017-04-04 MED ORDER — EZETIMIBE 10 MG PO TABS
10.0000 mg | ORAL_TABLET | Freq: Every evening | ORAL | Status: DC
  Administered 2017-04-05 – 2017-04-07 (×3): 10 mg via ORAL
  Filled 2017-04-04 (×3): qty 1

## 2017-04-04 MED ORDER — CHLORHEXIDINE GLUCONATE 4 % EX LIQD: 30.0000 mL | CUTANEOUS | Status: DC

## 2017-04-04 MED ORDER — MORPHINE SULFATE (PF) 2 MG/ML IV SOLN: 1.0000 mg | INTRAVENOUS | Status: DC | PRN

## 2017-04-04 MED ORDER — METOCLOPRAMIDE HCL 5 MG/ML IJ SOLN
10.0000 mg | Freq: Four times a day (QID) | INTRAMUSCULAR | Status: AC
  Administered 2017-04-04 – 2017-04-05 (×4): 10 mg via INTRAVENOUS
  Filled 2017-04-04 (×3): qty 2

## 2017-04-04 MED ORDER — THROMBIN 20000 UNITS EX SOLR
OROMUCOSAL | Status: DC | PRN
  Administered 2017-04-04 (×3): 4 mL via TOPICAL

## 2017-04-04 MED ORDER — SODIUM CHLORIDE 0.9% FLUSH
3.0000 mL | Freq: Two times a day (BID) | INTRAVENOUS | Status: DC
  Administered 2017-04-05 – 2017-04-06 (×2): 3 mL via INTRAVENOUS

## 2017-04-04 MED ORDER — FAMOTIDINE IN NACL 20-0.9 MG/50ML-% IV SOLN
20.0000 mg | Freq: Two times a day (BID) | INTRAVENOUS | Status: AC
  Administered 2017-04-04: 20 mg via INTRAVENOUS

## 2017-04-04 MED ORDER — ROCURONIUM BROMIDE 10 MG/ML (PF) SYRINGE
PREFILLED_SYRINGE | INTRAVENOUS | Status: AC
  Filled 2017-04-04: qty 10

## 2017-04-04 MED ORDER — ACETAMINOPHEN 160 MG/5ML PO SOLN: 650.0000 mg | Freq: Once | ORAL | Status: AC

## 2017-04-04 MED ORDER — ROCURONIUM BROMIDE 100 MG/10ML IV SOLN
INTRAVENOUS | Status: DC | PRN
  Administered 2017-04-04 (×2): 50 mg via INTRAVENOUS
  Administered 2017-04-04: 40 mg via INTRAVENOUS
  Administered 2017-04-04: 60 mg via INTRAVENOUS
  Administered 2017-04-04: 50 mg via INTRAVENOUS

## 2017-04-04 MED ORDER — ACETAMINOPHEN 160 MG/5ML PO SOLN: 1000.0000 mg | Freq: Four times a day (QID) | ORAL | Status: DC

## 2017-04-04 MED ORDER — POTASSIUM CHLORIDE 10 MEQ/50ML IV SOLN
10.0000 meq | INTRAVENOUS | Status: AC
  Administered 2017-04-04 (×3): 10 meq via INTRAVENOUS

## 2017-04-04 MED ORDER — TRAMADOL HCL 50 MG PO TABS: 50.0000 mg | ORAL_TABLET | ORAL | Status: DC | PRN

## 2017-04-04 MED ORDER — FENTANYL CITRATE (PF) 250 MCG/5ML IJ SOLN
INTRAMUSCULAR | Status: AC
  Filled 2017-04-04: qty 25

## 2017-04-04 MED ORDER — PROPOFOL 10 MG/ML IV BOLUS
INTRAVENOUS | Status: AC
  Filled 2017-04-04: qty 20

## 2017-04-04 MED ORDER — DEXTROSE 50 % IV SOLN
INTRAVENOUS | Status: AC
  Administered 2017-04-04: 14 mL
  Filled 2017-04-04: qty 50

## 2017-04-04 MED ORDER — FENTANYL CITRATE (PF) 100 MCG/2ML IJ SOLN
INTRAMUSCULAR | Status: DC | PRN
  Administered 2017-04-04: 150 ug via INTRAVENOUS
  Administered 2017-04-04 (×2): 250 ug via INTRAVENOUS
  Administered 2017-04-04 (×2): 150 ug via INTRAVENOUS
  Administered 2017-04-04: 200 ug via INTRAVENOUS
  Administered 2017-04-04: 100 ug via INTRAVENOUS

## 2017-04-04 MED ORDER — MIDAZOLAM HCL 10 MG/2ML IJ SOLN
INTRAMUSCULAR | Status: AC
  Filled 2017-04-04: qty 2

## 2017-04-04 MED ORDER — ALBUMIN HUMAN 5 % IV SOLN
INTRAVENOUS | Status: DC | PRN
  Administered 2017-04-04 (×2): via INTRAVENOUS

## 2017-04-04 MED ORDER — METOPROLOL TARTRATE 12.5 MG HALF TABLET
ORAL_TABLET | ORAL | Status: AC
  Filled 2017-04-04: qty 1

## 2017-04-04 MED ORDER — CHLORHEXIDINE GLUCONATE 0.12 % MT SOLN
OROMUCOSAL | Status: AC
  Filled 2017-04-04: qty 15

## 2017-04-04 MED ORDER — SODIUM CHLORIDE 0.9% FLUSH: 3.0000 mL | INTRAVENOUS | Status: DC | PRN

## 2017-04-04 MED ORDER — ORAL CARE MOUTH RINSE
15.0000 mL | Freq: Two times a day (BID) | OROMUCOSAL | Status: DC
  Administered 2017-04-04 – 2017-04-06 (×2): 15 mL via OROMUCOSAL

## 2017-04-04 MED ORDER — INSULIN REGULAR BOLUS VIA INFUSION
0.0000 [IU] | Freq: Three times a day (TID) | INTRAVENOUS | Status: DC
  Filled 2017-04-04: qty 10

## 2017-04-04 MED ORDER — BISACODYL 10 MG RE SUPP: 10.0000 mg | Freq: Every day | RECTAL | Status: DC

## 2017-04-04 MED ORDER — CHLORHEXIDINE GLUCONATE 0.12 % MT SOLN
15.0000 mL | OROMUCOSAL | Status: AC
  Administered 2017-04-04: 15 mL via OROMUCOSAL

## 2017-04-04 MED ORDER — CHLORHEXIDINE GLUCONATE CLOTH 2 % EX PADS
6.0000 | MEDICATED_PAD | Freq: Every day | CUTANEOUS | Status: DC
  Administered 2017-04-04 – 2017-04-06 (×3): 6 via TOPICAL

## 2017-04-04 MED ORDER — LIDOCAINE HCL (CARDIAC) 20 MG/ML IV SOLN
INTRAVENOUS | Status: DC | PRN
  Administered 2017-04-04: 60 mg via INTRAVENOUS

## 2017-04-04 MED ORDER — ORAL CARE MOUTH RINSE
15.0000 mL | OROMUCOSAL | Status: DC
  Administered 2017-04-04 (×2): 15 mL via OROMUCOSAL

## 2017-04-04 MED ORDER — LACTATED RINGERS IV SOLN
INTRAVENOUS | Status: DC | PRN
  Administered 2017-04-04: 07:00:00 via INTRAVENOUS

## 2017-04-04 MED ORDER — FAMOTIDINE 20 MG PO TABS: 20.0000 mg | ORAL_TABLET | Freq: Two times a day (BID) | ORAL | Status: DC

## 2017-04-04 MED ORDER — PROPOFOL 10 MG/ML IV BOLUS
INTRAVENOUS | Status: DC | PRN
  Administered 2017-04-04: 150 mg via INTRAVENOUS

## 2017-04-04 MED ORDER — OXYCODONE HCL 5 MG PO TABS: 5.0000 mg | ORAL_TABLET | ORAL | Status: DC | PRN

## 2017-04-04 MED ORDER — METOPROLOL TARTRATE 12.5 MG HALF TABLET: 12.5000 mg | ORAL_TABLET | Freq: Two times a day (BID) | ORAL | Status: DC

## 2017-04-04 MED ORDER — PRAVASTATIN SODIUM 20 MG PO TABS
20.0000 mg | ORAL_TABLET | Freq: Every day | ORAL | Status: DC
  Administered 2017-04-05 – 2017-04-07 (×3): 20 mg via ORAL
  Filled 2017-04-04 (×3): qty 1

## 2017-04-04 MED ORDER — PROTAMINE SULFATE 10 MG/ML IV SOLN
INTRAVENOUS | Status: DC | PRN
  Administered 2017-04-04 (×4): 50 mg via INTRAVENOUS
  Administered 2017-04-04: 20 mg via INTRAVENOUS
  Administered 2017-04-04 (×2): 50 mg via INTRAVENOUS
  Administered 2017-04-04: 20 mg via INTRAVENOUS

## 2017-04-04 MED ORDER — SODIUM CHLORIDE 0.9 % IV SOLN
0.0000 ug/min | INTRAVENOUS | Status: DC
  Administered 2017-04-04: 25 ug/min via INTRAVENOUS
  Administered 2017-04-05 (×2): 40 ug/min via INTRAVENOUS
  Filled 2017-04-04 (×2): qty 2

## 2017-04-04 MED ORDER — LACTATED RINGERS IV SOLN
INTRAVENOUS | Status: DC
  Administered 2017-04-04: 14:00:00 via INTRAVENOUS

## 2017-04-04 MED ORDER — MORPHINE SULFATE (PF) 4 MG/ML IV SOLN: 2.0000 mg | INTRAVENOUS | Status: DC | PRN

## 2017-04-04 MED ORDER — HEPARIN SODIUM (PORCINE) 1000 UNIT/ML IJ SOLN
INTRAMUSCULAR | Status: AC
  Filled 2017-04-04: qty 1

## 2017-04-04 MED ORDER — DEXTROSE 50 % IV SOLN: 14.0000 mL | Freq: Once | INTRAVENOUS | Status: DC

## 2017-04-04 MED ORDER — PHENYLEPHRINE HCL 10 MG/ML IJ SOLN
INTRAVENOUS | Status: DC | PRN
  Administered 2017-04-04: 25 ug/min via INTRAVENOUS

## 2017-04-04 MED ORDER — SODIUM CHLORIDE 0.9% FLUSH
10.0000 mL | Freq: Two times a day (BID) | INTRAVENOUS | Status: DC
  Administered 2017-04-04 – 2017-04-06 (×2): 10 mL

## 2017-04-04 MED ORDER — MORPHINE SULFATE (PF) 2 MG/ML IV SOLN: 2.0000 mg | INTRAVENOUS | Status: DC | PRN

## 2017-04-04 MED ORDER — ACETAMINOPHEN 500 MG PO TABS
1000.0000 mg | ORAL_TABLET | Freq: Four times a day (QID) | ORAL | Status: DC
  Administered 2017-04-04 – 2017-04-06 (×6): 1000 mg via ORAL
  Filled 2017-04-04 (×5): qty 2

## 2017-04-04 MED ORDER — DEXTROSE 5 % IV SOLN
1.5000 g | Freq: Two times a day (BID) | INTRAVENOUS | Status: AC
  Administered 2017-04-04 – 2017-04-06 (×4): 1.5 g via INTRAVENOUS
  Filled 2017-04-04 (×4): qty 1.5

## 2017-04-04 MED ORDER — PROTAMINE SULFATE 10 MG/ML IV SOLN
INTRAVENOUS | Status: AC
  Filled 2017-04-04: qty 25

## 2017-04-04 MED ORDER — MORPHINE SULFATE (PF) 4 MG/ML IV SOLN: 1.0000 mg | INTRAVENOUS | Status: AC | PRN

## 2017-04-04 MED ORDER — LACTATED RINGERS IV SOLN: 500.0000 mL | Freq: Once | INTRAVENOUS | Status: DC | PRN

## 2017-04-04 MED ORDER — SODIUM CHLORIDE 0.9 % IV SOLN
0.0000 ug/kg/h | INTRAVENOUS | Status: DC
  Administered 2017-04-04: 0.7 ug/kg/h via INTRAVENOUS
  Filled 2017-04-04: qty 2

## 2017-04-04 MED ORDER — EPHEDRINE SULFATE 50 MG/ML IJ SOLN
INTRAMUSCULAR | Status: DC | PRN
  Administered 2017-04-04: 5 mg via INTRAVENOUS

## 2017-04-04 MED ORDER — ASPIRIN EC 325 MG PO TBEC
325.0000 mg | DELAYED_RELEASE_TABLET | Freq: Every day | ORAL | Status: DC
  Administered 2017-04-05 – 2017-04-06 (×2): 325 mg via ORAL
  Filled 2017-04-04 (×2): qty 1

## 2017-04-04 MED ORDER — HEPARIN SODIUM (PORCINE) 1000 UNIT/ML IJ SOLN
INTRAMUSCULAR | Status: DC | PRN
  Administered 2017-04-04: 34000 [IU] via INTRAVENOUS

## 2017-04-04 MED ORDER — PANTOPRAZOLE SODIUM 40 MG PO TBEC
40.0000 mg | DELAYED_RELEASE_TABLET | Freq: Every day | ORAL | Status: DC
  Administered 2017-04-06: 40 mg via ORAL
  Filled 2017-04-04: qty 1

## 2017-04-04 MED ORDER — ATROPINE SULFATE 0.4 MG/ML IJ SOLN
INTRAMUSCULAR | Status: DC | PRN
  Administered 2017-04-04: .1 mg via INTRAVENOUS

## 2017-04-04 MED ORDER — THROMBIN 20000 UNITS EX SOLR
CUTANEOUS | Status: DC | PRN
  Administered 2017-04-04: 20000 [IU] via TOPICAL

## 2017-04-04 MED ORDER — METOPROLOL TARTRATE 25 MG/10 ML ORAL SUSPENSION: 12.5000 mg | Freq: Two times a day (BID) | ORAL | Status: DC

## 2017-04-04 MED ORDER — THROMBIN 20000 UNITS EX SOLR
CUTANEOUS | Status: AC
  Filled 2017-04-04: qty 20000

## 2017-04-04 MED ORDER — MAGNESIUM SULFATE 4 GM/100ML IV SOLN
4.0000 g | Freq: Once | INTRAVENOUS | Status: AC
  Administered 2017-04-04: 4 g via INTRAVENOUS
  Filled 2017-04-04: qty 100

## 2017-04-04 MED ORDER — MIDAZOLAM HCL 2 MG/2ML IJ SOLN: 2.0000 mg | INTRAMUSCULAR | Status: DC | PRN

## 2017-04-04 MED FILL — Potassium Chloride Inj 2 mEq/ML: INTRAVENOUS | Qty: 40 | Status: AC

## 2017-04-04 MED FILL — Magnesium Sulfate Inj 50%: INTRAMUSCULAR | Qty: 10 | Status: AC

## 2017-04-04 MED FILL — Heparin Sodium (Porcine) Inj 1000 Unit/ML: INTRAMUSCULAR | Qty: 30 | Status: AC

## 2017-04-04 SURGICAL SUPPLY — 113 items
BAG DECANTER FOR FLEXI CONT (MISCELLANEOUS) ×3 IMPLANT
BANDAGE ACE 4X5 VEL STRL LF (GAUZE/BANDAGES/DRESSINGS) ×3 IMPLANT
BANDAGE ACE 6X5 VEL STRL LF (GAUZE/BANDAGES/DRESSINGS) ×3 IMPLANT
BASKET HEART (ORDER IN 25'S) (MISCELLANEOUS) ×1
BASKET HEART (ORDER IN 25S) (MISCELLANEOUS) ×2 IMPLANT
BLADE 11 SAFETY STRL DISP (BLADE) ×3 IMPLANT
BLADE STERNUM SYSTEM 6 (BLADE) ×3 IMPLANT
BNDG GAUZE ELAST 4 BULKY (GAUZE/BANDAGES/DRESSINGS) ×3 IMPLANT
CANISTER SUCT 3000ML PPV (MISCELLANEOUS) ×3 IMPLANT
CATH ROBINSON RED A/P 18FR (CATHETERS) ×6 IMPLANT
CATH THORACIC 28FR (CATHETERS) ×3 IMPLANT
CATH THORACIC 36FR (CATHETERS) ×3 IMPLANT
CATH THORACIC 36FR RT ANG (CATHETERS) ×3 IMPLANT
CLIP TI WIDE RED SMALL 24 (CLIP) ×12 IMPLANT
CLIP VESOCCLUDE MED 24/CT (CLIP) IMPLANT
CLIP VESOCCLUDE SM WIDE 24/CT (CLIP) IMPLANT
CRADLE DONUT ADULT HEAD (MISCELLANEOUS) ×3 IMPLANT
DERMABOND ADVANCED (GAUZE/BANDAGES/DRESSINGS) ×1
DERMABOND ADVANCED .7 DNX12 (GAUZE/BANDAGES/DRESSINGS) ×2 IMPLANT
DRAPE CARDIOVASCULAR INCISE (DRAPES) ×1
DRAPE SLUSH/WARMER DISC (DRAPES) ×3 IMPLANT
DRAPE SRG 135X102X78XABS (DRAPES) ×2 IMPLANT
DRSG COVADERM 4X14 (GAUZE/BANDAGES/DRESSINGS) ×3 IMPLANT
ELECT CAUTERY BLADE 6.4 (BLADE) ×3 IMPLANT
ELECT REM PT RETURN 9FT ADLT (ELECTROSURGICAL) ×6
ELECTRODE REM PT RTRN 9FT ADLT (ELECTROSURGICAL) ×4 IMPLANT
FELT TEFLON 1X6 (MISCELLANEOUS) ×6 IMPLANT
GAUZE SPONGE 4X4 12PLY STRL (GAUZE/BANDAGES/DRESSINGS) ×6 IMPLANT
GAUZE SPONGE 4X4 12PLY STRL LF (GAUZE/BANDAGES/DRESSINGS) ×6 IMPLANT
GLOVE BIO SURGEON STRL SZ 6 (GLOVE) ×12 IMPLANT
GLOVE BIO SURGEON STRL SZ 6.5 (GLOVE) ×6 IMPLANT
GLOVE BIO SURGEON STRL SZ7 (GLOVE) IMPLANT
GLOVE BIO SURGEON STRL SZ7.5 (GLOVE) IMPLANT
GLOVE BIOGEL PI IND STRL 6 (GLOVE) IMPLANT
GLOVE BIOGEL PI IND STRL 6.5 (GLOVE) ×4 IMPLANT
GLOVE BIOGEL PI IND STRL 7.0 (GLOVE) IMPLANT
GLOVE BIOGEL PI INDICATOR 6 (GLOVE)
GLOVE BIOGEL PI INDICATOR 6.5 (GLOVE) ×2
GLOVE BIOGEL PI INDICATOR 7.0 (GLOVE)
GLOVE EUDERMIC 7 POWDERFREE (GLOVE) ×6 IMPLANT
GLOVE ORTHO TXT STRL SZ7.5 (GLOVE) IMPLANT
GOWN STRL REUS W/ TWL LRG LVL3 (GOWN DISPOSABLE) ×12 IMPLANT
GOWN STRL REUS W/ TWL XL LVL3 (GOWN DISPOSABLE) ×2 IMPLANT
GOWN STRL REUS W/TWL LRG LVL3 (GOWN DISPOSABLE) ×6
GOWN STRL REUS W/TWL XL LVL3 (GOWN DISPOSABLE) ×1
HEMOSTAT POWDER SURGIFOAM 1G (HEMOSTASIS) ×9 IMPLANT
HEMOSTAT SURGICEL 2X14 (HEMOSTASIS) ×3 IMPLANT
INSERT FOGARTY 61MM (MISCELLANEOUS) IMPLANT
INSERT FOGARTY XLG (MISCELLANEOUS) IMPLANT
KIT BASIN OR (CUSTOM PROCEDURE TRAY) ×3 IMPLANT
KIT CATH CPB BARTLE (MISCELLANEOUS) ×3 IMPLANT
KIT ROOM TURNOVER OR (KITS) ×3 IMPLANT
KIT SUCTION CATH 14FR (SUCTIONS) ×3 IMPLANT
KIT VASOVIEW HEMOPRO VH 3000 (KITS) ×3 IMPLANT
NS IRRIG 1000ML POUR BTL (IV SOLUTION) ×15 IMPLANT
PACK OPEN HEART (CUSTOM PROCEDURE TRAY) ×3 IMPLANT
PAD ARMBOARD 7.5X6 YLW CONV (MISCELLANEOUS) ×6 IMPLANT
PAD ELECT DEFIB RADIOL ZOLL (MISCELLANEOUS) ×3 IMPLANT
PENCIL BUTTON HOLSTER BLD 10FT (ELECTRODE) ×3 IMPLANT
PUNCH AORTIC ROTATE 4.0MM (MISCELLANEOUS) IMPLANT
PUNCH AORTIC ROTATE 4.5MM 8IN (MISCELLANEOUS) ×3 IMPLANT
PUNCH AORTIC ROTATE 5MM 8IN (MISCELLANEOUS) IMPLANT
SET CARDIOPLEGIA MPS 5001102 (MISCELLANEOUS) ×3 IMPLANT
SOLUTION ANTI FOG 6CC (MISCELLANEOUS) ×3 IMPLANT
SPONGE INTESTINAL PEANUT (DISPOSABLE) IMPLANT
SPONGE LAP 18X18 X RAY DECT (DISPOSABLE) ×3 IMPLANT
SPONGE LAP 4X18 X RAY DECT (DISPOSABLE) ×3 IMPLANT
SUT BONE WAX W31G (SUTURE) ×3 IMPLANT
SUT ETHIBOND 2 0 SH (SUTURE) ×4
SUT ETHIBOND 2 0 SH 36X2 (SUTURE) ×8 IMPLANT
SUT MNCRL AB 4-0 PS2 18 (SUTURE) ×3 IMPLANT
SUT PROLENE 3 0 SH DA (SUTURE) IMPLANT
SUT PROLENE 3 0 SH1 36 (SUTURE) ×3 IMPLANT
SUT PROLENE 4 0 RB 1 (SUTURE) ×1
SUT PROLENE 4 0 SH DA (SUTURE) IMPLANT
SUT PROLENE 4-0 RB1 .5 CRCL 36 (SUTURE) ×2 IMPLANT
SUT PROLENE 5 0 C 1 36 (SUTURE) ×6 IMPLANT
SUT PROLENE 6 0 C 1 30 (SUTURE) ×6 IMPLANT
SUT PROLENE 7 0 BV 1 (SUTURE) ×3 IMPLANT
SUT PROLENE 7 0 BV1 MDA (SUTURE) ×3 IMPLANT
SUT PROLENE 8 0 BV175 6 (SUTURE) ×6 IMPLANT
SUT SILK  1 MH (SUTURE) ×3
SUT SILK 1 MH (SUTURE) ×6 IMPLANT
SUT SILK 1 TIES 10X30 (SUTURE) ×3 IMPLANT
SUT SILK 2 0 SH CR/8 (SUTURE) ×6 IMPLANT
SUT SILK 2 0 TIES 10X30 (SUTURE) ×3 IMPLANT
SUT SILK 2 0 TIES 17X18 (SUTURE) ×1
SUT SILK 2-0 18XBRD TIE BLK (SUTURE) ×2 IMPLANT
SUT SILK 3 0 SH CR/8 (SUTURE) ×3 IMPLANT
SUT SILK 4 0 TIE 10X30 (SUTURE) ×6 IMPLANT
SUT STEEL STERNAL CCS#1 18IN (SUTURE) IMPLANT
SUT STEEL SZ 6 DBL 3X14 BALL (SUTURE) ×9 IMPLANT
SUT TEM PAC WIRE 2 0 SH (SUTURE) ×12 IMPLANT
SUT VIC AB 1 CTX 36 (SUTURE) ×5
SUT VIC AB 1 CTX36XBRD ANBCTR (SUTURE) ×10 IMPLANT
SUT VIC AB 2-0 CT1 27 (SUTURE) ×1
SUT VIC AB 2-0 CT1 TAPERPNT 27 (SUTURE) ×2 IMPLANT
SUT VIC AB 2-0 CTX 27 (SUTURE) ×6 IMPLANT
SUT VIC AB 3-0 SH 27 (SUTURE)
SUT VIC AB 3-0 SH 27X BRD (SUTURE) IMPLANT
SUT VIC AB 3-0 X1 27 (SUTURE) ×6 IMPLANT
SUT VICRYL 4-0 PS2 18IN ABS (SUTURE) IMPLANT
SUTURE E-PAK OPEN HEART (SUTURE) IMPLANT
SYSTEM SAHARA CHEST DRAIN ATS (WOUND CARE) ×3 IMPLANT
TAPE CLOTH SURG 4X10 WHT LF (GAUZE/BANDAGES/DRESSINGS) ×6 IMPLANT
TOWEL GREEN STERILE (TOWEL DISPOSABLE) ×12 IMPLANT
TOWEL GREEN STERILE FF (TOWEL DISPOSABLE) ×6 IMPLANT
TOWEL OR 17X24 6PK STRL BLUE (TOWEL DISPOSABLE) ×3 IMPLANT
TOWEL OR 17X26 10 PK STRL BLUE (TOWEL DISPOSABLE) ×3 IMPLANT
TRAY FOLEY SILVER 16FR TEMP (SET/KITS/TRAYS/PACK) ×3 IMPLANT
TUBING INSUFFLATION (TUBING) ×3 IMPLANT
UNDERPAD 30X30 (UNDERPADS AND DIAPERS) ×3 IMPLANT
WATER STERILE IRR 1000ML POUR (IV SOLUTION) ×6 IMPLANT

## 2017-04-04 NOTE — Progress Notes (Signed)
Dr. Marin Comment  Aware of Blood sugar 271,  No intervention at this time.

## 2017-04-04 NOTE — Progress Notes (Addendum)
Patient placed back on SIMV with a rate of 10 due to multiple times of patient falling asleep and having no patient effort on ventilator with SATs dropping to 90%. RN and myself were giving patient more time to wake up, however, he still has no effort to breath when not stimulated for a while. Will re-attempt wean process when patient is more awake.

## 2017-04-04 NOTE — Anesthesia Procedure Notes (Addendum)
Central Venous Catheter Insertion Performed by: Lillia Abed, anesthesiologist Start/End8/16/2018 6:45 AM, 04/04/2017 7:00 AM Patient location: Pre-op. Preanesthetic checklist: patient identified, IV checked, site marked, risks and benefits discussed, surgical consent, monitors and equipment checked, pre-op evaluation, timeout performed and anesthesia consent Lidocaine 1% used for infiltration and patient sedated Hand hygiene performed  and maximum sterile barriers used  Catheter size: 8.5 Fr PA cath was placed.Sheath introducer Procedure performed using ultrasound guided technique. Ultrasound Notes:anatomy identified, needle tip was noted to be adjacent to the nerve/plexus identified, no ultrasound evidence of intravascular and/or intraneural injection and image(s) printed for medical record Attempts: 1 Following insertion, line sutured and dressing applied. Post procedure assessment: blood return through all ports, free fluid flow and no air  Patient tolerated the procedure well with no immediate complications.

## 2017-04-04 NOTE — Interval H&P Note (Signed)
History and Physical Interval Note:  04/04/2017 6:14 AM  Jordan [name]  has presented today for surgery, with the diagnosis of CAD  The various methods of treatment have been discussed with the patient and family. After consideration of risks, benefits and other options for treatment, the patient has consented to  Procedure(s): CORONARY ARTERY BYPASS GRAFTING (CABG) (N/A) TRANSESOPHAGEAL ECHOCARDIOGRAM (TEE) (N/A) as a surgical intervention .  The patient's history has been reviewed, patient examined, no change in status, stable for surgery.  I have reviewed the patient's chart and labs.  Questions were answered to the patient's satisfaction.     Gaye Pollack

## 2017-04-04 NOTE — Anesthesia Procedure Notes (Cosign Needed)
Arterial Line Insertion Start/End8/16/2018 6:45 AM Performed by: Lavell Luster, CRNA  Patient location: Pre-op. Preanesthetic checklist: patient identified and IV checked Lidocaine 1% used for infiltration Left, radial was placed Hand hygiene performed  and maximum sterile barriers used   Attempts: 1 Procedure performed without using ultrasound guided technique.

## 2017-04-04 NOTE — Progress Notes (Signed)
Patient ID: Jordan [name], male   DOB: 1941-02-24, 76 y.o.   MRN: [id number]  SICU Evening Rounds:   Hemodynamically stable  CI = 2.0  Extubated and alert  Urine output good  CT output low  CBC    Component Value Date/Time   WBC 11.7 (H) 04/04/2017 1909   RBC 4.08 (L) 04/04/2017 1909   HGB 11.6 (L) 04/04/2017 1921   HGB 15.7 03/04/2017 0000   HCT 34.0 (L) 04/04/2017 1921   HCT 47.7 03/04/2017 0000   PLT 194 04/04/2017 1909   PLT 207 03/04/2017 0000   MCV 90.4 04/04/2017 1909   MCV 92 03/04/2017 0000   MCH 31.4 04/04/2017 1909   MCHC 34.7 04/04/2017 1909   RDW 12.9 04/04/2017 1909   RDW 13.9 03/04/2017 0000     BMET    Component Value Date/Time   NA 140 04/04/2017 1921   NA 143 03/04/2017 0000   K 5.0 04/04/2017 1921   CL 105 04/04/2017 1921   CO2 21 (L) 04/02/2017 1026   GLUCOSE 164 (H) 04/04/2017 1921   BUN 17 04/04/2017 1921   BUN 17 03/04/2017 0000   CREATININE 0.90 04/04/2017 1921   CALCIUM 9.0 04/02/2017 1026   GFRNONAA >60 04/02/2017 1026   GFRAA >60 04/02/2017 1026     A/P:  Stable postop course. Continue current plans

## 2017-04-04 NOTE — Anesthesia Preprocedure Evaluation (Signed)
Anesthesia Evaluation  Patient identified by MRN, date of birth, ID band Patient awake    Reviewed: Allergy & Precautions, H&P , NPO status , Patient's Chart, lab work & pertinent test results  Airway Mallampati: II   Neck ROM: full    Dental   Pulmonary    breath sounds clear to auscultation       Cardiovascular hypertension, + CAD and + Peripheral Vascular Disease   Rhythm:regular Rate:Normal  Preserved LV function   Neuro/Psych  Headaches,    GI/Hepatic hiatal hernia, GERD  ,  Endo/Other    Renal/GU      Musculoskeletal  (+) Arthritis ,   Abdominal   Peds  Hematology   Anesthesia Other Findings   Reproductive/Obstetrics                             Anesthesia Physical Anesthesia Plan  ASA: III  Anesthesia Plan: General   Post-op Pain Management:    Induction: Intravenous  PONV Risk Score and Plan: 2 and Ondansetron, Dexamethasone, Midazolam and Treatment may vary due to age or medical condition  Airway Management Planned: Oral ETT  Additional Equipment: Arterial line, CVP, PA Cath, TEE and Ultrasound Guidance Line Placement  Intra-op Plan:   Post-operative Plan: Post-operative intubation/ventilation  Informed Consent: I have reviewed the patients History and Physical, chart, labs and discussed the procedure including the risks, benefits and alternatives for the proposed anesthesia with the patient or authorized representative who has indicated his/her understanding and acceptance.     Plan Discussed with: CRNA, Anesthesiologist and Surgeon  Anesthesia Plan Comments:         Anesthesia Quick Evaluation

## 2017-04-04 NOTE — Anesthesia Procedure Notes (Signed)
Procedure Name: Intubation Date/Time: 04/04/2017 7:50 AM Performed by: Lavell Luster Pre-anesthesia Checklist: Patient identified, Emergency Drugs available, Suction available, Patient being monitored and Timeout performed Patient Re-evaluated:Patient Re-evaluated prior to induction Oxygen Delivery Method: Circle system utilized Preoxygenation: Pre-oxygenation with 100% oxygen Induction Type: IV induction Ventilation: Mask ventilation without difficulty Laryngoscope Size: Mac and 4 Grade View: Grade IV Tube type: Oral Tube size: 8.0 mm Number of attempts: 2 Airway Equipment and Method: Stylet Placement Confirmation: ETT inserted through vocal cords under direct vision,  positive ETCO2 and breath sounds checked- equal and bilateral Secured at: 20 cm Tube secured with: Tape Dental Injury: Teeth and Oropharynx as per pre-operative assessment  Difficulty Due To: Difficult Airway- due to anterior larynx, Difficult Airway- due to dentition and Difficult Airway- due to limited oral opening Future Recommendations: Recommend- induction with short-acting agent, and alternative techniques readily available Comments: DL x 1 by SRNA with poor view, esophageal intubation quickly noticed and ETT removed.  DL x 1 by Henderson Cloud, CRNA with Grade III-IV view.  ETT passed, +ETC2 noted, BBSE.  Recommend videolaryngoscope and short acting paralytic.  Henderson Cloud, CRNA

## 2017-04-04 NOTE — Procedures (Signed)
Extubation Procedure Note  Patient Details:   Name: Jordan [name] DOB: [birth year] MRN: [id number]   Airway Documentation:     Evaluation  O2 sats: stable throughout Complications: No apparent complications Patient did tolerate procedure well. Bilateral Breath Sounds: Clear   Yes  Patient tolerated wean. NIF -27 and VC 0.85 L. Positive for cuff leak. Patient extubated to a 4 Lpm nasal cannula. No signs of dyspnea or stridor noted. Patient instructed on the Incentive Spirometer achieving 800 mL three times. RN at bedside.   [name] Neither 04/04/2017, 7:21 PM

## 2017-04-04 NOTE — Op Note (Signed)
CARDIOVASCULAR SURGERY OPERATIVE NOTE  04/04/2017  Surgeon:  Gaye Pollack, MD  First Assistant: Ellwood Handler, PA-C   Preoperative Diagnosis:  Left main and multi-vessel coronary artery disease   Postoperative Diagnosis:  Same   Procedure:  1. Median Sternotomy 2. Extracorporeal circulation 3.   Coronary artery bypass grafting x 4   Left internal mammary graft to the LAD  SVG to Diagonal 2  SVG to OM  SVG to PDA 4.   Endoscopic vein harvest from the right leg   Anesthesia:  General Endotracheal   Clinical History/Surgical Indication:  The patient is a 76 year old gentleman with a history of hyperlipidemia, and hypertension who presented to Dr. Joylene Draft in June with complaints of shortness of breath with exertion such as going up hills. 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 He has had some exertional fatigue but no chest discomfort. He had an intermediate risk nuclear stress on 02/12/2017 with a small, moderate intensity reversible defect in the mid to distal anterior wall suggesting ischemia with an abnormal exercise ECG. Cardiac cath on 03/18/2017 showed a distal LM stenosis estimated at 55% which I think is probably a little worse. The LAD has 75% proximal and mid vessel stenoses. The RCA has diffuse non-obstructive disease. LVEF is 55-65%. He was also seen by pulmonary medicine about the same time and his PFT's were essentially normal with a mild diffusion defect with a DLCO of 71%  He has significant distal LM and LAD stenoses with an abnormal nuclear stress test and progressive exertional shortness of breath, fatigue and chest pressure that is limiting his ability to be active. I agree that CABG is the best treatment for him. I reviewed the cath films with him and answered his questions. I discussed the  operative procedure with the patient and his wifeincluding alternatives, benefits and risks; including but not limited to bleeding, blood transfusion, infection, stroke, myocardial infarction, graft failure, heart block requiring a permanent pacemaker, organ dysfunction, and death. Jordan D Sprinkleunderstands and agrees to proceed  Preparation:  The patient was seen in the preoperative holding area and the correct patient, correct operation were confirmed with the patient after reviewing the medical record and catheterization. The consent was signed by me. Preoperative antibiotics were given. A pulmonary arterial line and radial arterial line were placed by the anesthesia team. The patient was taken back to the operating room and positioned supine on the operating room table. After being placed under general endotracheal anesthesia by the anesthesia team a foley catheter was placed. The neck, chest, abdomen, and both legs were prepped with betadine soap and solution and draped in the usual sterile manner. A surgical time-out was taken and the correct patient and operative procedure were confirmed with the nursing and anesthesia staff.   Cardiopulmonary Bypass:  A median sternotomy was performed. The pericardium was opened in the midline. Right ventricular function appeared normal. The ascending aorta was of normal size and had no palpable plaque. There were no contraindications to aortic cannulation or cross-clamping. The patient was fully systemically heparinized and the ACT was maintained > 400 sec. The proximal aortic arch was cannulated with a 20 F aortic cannula for arterial inflow. Venous cannulation was performed via the right atrial appendage using a two-staged venous cannula. An antegrade cardioplegia/vent cannula was inserted into the mid-ascending aorta. Aortic occlusion was performed with a single cross-clamp.  Systemic cooling to 32 degrees Centigrade and topical cooling of the heart with iced  saline were used. Hyperkalemic antegrade cold blood cardioplegia was used to induce diastolic arrest and was then given at about 20 minute intervals throughout the period of arrest to maintain myocardial temperature at or below 10 degrees centigrade. A temperature probe was inserted into the interventricular septum and an insulating pad was placed in the pericardium.   Left internal mammary harvest:  The left side of the sternum was retracted using the Rultract retractor. The left internal mammary artery was harvested as a pedicle graft. All side branches were clipped. It was a large-sized vessel of good quality with excellent blood flow. It was ligated distally and divided. It was sprayed with topical papaverine solution to prevent vasospasm.   Endoscopic vein harvest:  The right greater saphenous vein was harvested endoscopically through a 2 cm incision medial to the right knee. It was harvested from the upper thigh to below the knee. It was a medium-sized vein of good quality. The side branches were all ligated with 4-0 silk ties.    Coronary arteries:  The coronary arteries were examined.   LAD:  Intramyocardial through most of its extent. The only visible portion is near the apex where it is small. It was located in the muscle in the mid-portion where it was a large vessel with no disease. D1 was small, D2 was medium caliber.   LCX:  OM is a medium caliber vessel with no distal disease  RCA:  Severely and diffusely diseased with calcific plaque extending out to the origin of the PDA. The PDA is a medium caliber vessel with focal plaque extending out to the distal vessel. The PL branches are smaller and have no distal disease. I decided to graft the PDA because one of the RCA injections on cath showed significant narrowing in the mid vessel proximal to the PDA. It was only read as 40% on the cath report but I think it is tighter and significant.   Grafts:  1. LIMA to the LAD: 2.5 mm.  It was sewn end to side using 8-0 prolene continuous suture. 2. SVG to D2:  1.6 mm. It was sewn end to side using 7-0 prolene continuous suture. 3. SVG to OM:  1.6 mm. It was sewn end to side using 7-0 prolene continuous suture. 4. SVG to PDA:  1.75 mm. It was sewn end to side using 7-0 prolene continuous suture.  The proximal vein graft anastomoses were performed to the mid-ascending aorta using continuous 6-0 prolene suture. Graft markers were placed around the proximal anastomoses.   Completion:  The patient was rewarmed to 37 degrees Centigrade. The clamp was removed from the LIMA pedicle and there was rapid warming of the septum and return of ventricular fibrillation. The crossclamp was removed with a time of 80 minutes. There was spontaneous return of sinus rhythm. The distal and proximal anastomoses were checked for hemostasis. The position of the grafts was satisfactory. Two temporary epicardial pacing wires were placed on the right atrium and two on the right ventricle. The patient was weaned from CPB without difficulty on no inotropes. CPB time was 98 minutes. Cardiac output was 5 LPM. Heparin was fully reversed with protamine and the aortic and venous cannulas removed. Hemostasis was achieved. Mediastinal and left pleural drainage tubes were placed. The sternum was closed with double #6 stainless steel wires. The fascia was closed with continuous # 1 vicryl suture. The subcutaneous tissue was  closed with 2-0 vicryl continuous suture. The skin was closed with 3-0 vicryl subcuticular suture. All sponge, needle, and instrument counts were reported correct at the end of the case. Dry sterile dressings were placed over the incisions and around the chest tubes which were connected to pleurevac suction. The patient was then transported to the surgical intensive care unit in critical but stable condition.

## 2017-04-04 NOTE — Brief Op Note (Signed)
04/04/2017  11:17 AM  PATIENT:  Jordan [name]  76 y.o. male  PRE-OPERATIVE DIAGNOSIS:  CAD  POST-OPERATIVE DIAGNOSIS:  CAD  PROCEDURE:  Procedure(s):  CORONARY ARTERY BYPASS GRAFTING x 4 -LIMA to LAD -SVG to DIAGONAL 2 -SVG to OM 1 -SVG to PDA  ENDOSCOPIC HARVEST GREATER SAPHENOUS VEIN -Right Leg  TRANSESOPHAGEAL ECHOCARDIOGRAM (TEE) (N/A)  SURGEON:  Surgeon(s) and Role:    * Bartle, Fernande Boyden, MD - Primary  PHYSICIAN ASSISTANT: Erin Barrett PA-C  ANESTHESIA:   general  EBL:  Total I/O In: 1000 [I.V.:1000] Out: 500 [Urine:500]  BLOOD ADMINISTERED: CELLSAVER  DRAINS: Left Pleural Chest Tubes, Mediastinal Chest Drains   LOCAL MEDICATIONS USED:  NONE  SPECIMEN:  No Specimen  DISPOSITION OF SPECIMEN:  N/A  COUNTS:  YES  TOURNIQUET:  * No tourniquets in log *  DICTATION: .Dragon Dictation  PLAN OF CARE: Admit to inpatient   PATIENT DISPOSITION:  ICU - intubated and hemodynamically stable.   Delay start of Pharmacological VTE agent (>24hrs) due to surgical blood loss or risk of bleeding: yes

## 2017-04-04 NOTE — Progress Notes (Signed)
Rapid wean attempted again, however, patient still having periods of apnea due to sleepiness, as well as many VT's in the 200's. Placed back on SIMV with rate of 10. Will reassess again at later time.

## 2017-04-04 NOTE — OR Nursing (Signed)
SICU on the way update calls to [facility] charge @ 1- [phone]                                                                                     [phone]

## 2017-04-04 NOTE — Transfer of Care (Signed)
Immediate Anesthesia Transfer of Care Note  Patient: Jordan [name]  Procedure(s) Performed: Procedure(s): CORONARY ARTERY BYPASS GRAFTING times  four  with left internal mammary artery and right leg saphenous vein (N/A) TRANSESOPHAGEAL ECHOCARDIOGRAM (TEE) (N/A)  Patient Location: SICU  Anesthesia Type:General  Level of Consciousness: Patient remains intubated per anesthesia plan  Airway & Oxygen Therapy: Patient remains intubated per anesthesia plan  Post-op Assessment: Post -op Vital signs reviewed and stable  Post vital signs: stable  Last Vitals:  Vitals:   04/04/17 0724 04/04/17 0725  BP:    Pulse: (!) 49 64  Resp: 10   Temp:    SpO2: 95% 97%    Last Pain:  Vitals:   04/04/17 0628  TempSrc: Oral         Complications: No apparent anesthesia complications

## 2017-04-05 ENCOUNTER — Inpatient Hospital Stay (HOSPITAL_COMMUNITY): Payer: Medicare Other

## 2017-04-05 ENCOUNTER — Encounter (HOSPITAL_COMMUNITY): Payer: Self-pay | Admitting: Surgery

## 2017-04-05 LAB — CBC
HCT: 32.8 % — ABNORMAL LOW (ref 39.0–52.0)
HEMATOCRIT: 33.2 % — AB (ref 39.0–52.0)
HEMOGLOBIN: 11.3 g/dL — AB (ref 13.0–17.0)
HEMOGLOBIN: 11.3 g/dL — AB (ref 13.0–17.0)
MCH: 31 pg (ref 26.0–34.0)
MCH: 31.4 pg (ref 26.0–34.0)
MCHC: 34 g/dL (ref 30.0–36.0)
MCHC: 34.5 g/dL (ref 30.0–36.0)
MCV: 91 fL (ref 78.0–100.0)
MCV: 91.1 fL (ref 78.0–100.0)
PLATELETS: 157 10*3/uL (ref 150–400)
Platelets: 193 10*3/uL (ref 150–400)
RBC: 3.65 MIL/uL — ABNORMAL LOW (ref 4.22–5.81)
RBC: 3.6 MIL/uL — ABNORMAL LOW (ref 4.22–5.81)
RDW: 13.2 % (ref 11.5–15.5)
RDW: 13.2 % (ref 11.5–15.5)
WBC: 9.8 10*3/uL (ref 4.0–10.5)
WBC: 10.3 10*3/uL (ref 4.0–10.5)

## 2017-04-05 LAB — POCT I-STAT, CHEM 8
BUN: 13 mg/dL (ref 6–20)
CALCIUM ION: 1.15 mmol/L (ref 1.15–1.40)
Chloride: 99 mmol/L — ABNORMAL LOW (ref 101–111)
Creatinine, Ser: 0.6 mg/dL — ABNORMAL LOW (ref 0.61–1.24)
GLUCOSE: 139 mg/dL — AB (ref 65–99)
HCT: 31 % — ABNORMAL LOW (ref 39.0–52.0)
HEMOGLOBIN: 10.5 g/dL — AB (ref 13.0–17.0)
Potassium: 3.9 mmol/L (ref 3.5–5.1)
SODIUM: 137 mmol/L (ref 135–145)
TCO2: 22 mmol/L (ref 0–100)

## 2017-04-05 LAB — BASIC METABOLIC PANEL
ANION GAP: 7 (ref 5–15)
BUN: 15 mg/dL (ref 6–20)
CALCIUM: 7.6 mg/dL — AB (ref 8.9–10.3)
CO2: 23 mmol/L (ref 22–32)
CREATININE: 0.97 mg/dL (ref 0.61–1.24)
Chloride: 107 mmol/L (ref 101–111)
GFR calc non Af Amer: >60 mL/min (ref >60)
Glucose, Bld: 108 mg/dL — ABNORMAL HIGH (ref 65–99)
Potassium: 4.1 mmol/L (ref 3.5–5.1)
SODIUM: 137 mmol/L (ref 135–145)

## 2017-04-05 LAB — GLUCOSE, CAPILLARY
Glucose-Capillary: 102 mg/dL — ABNORMAL HIGH (ref 65–99)
Glucose-Capillary: 101 mg/dL — ABNORMAL HIGH (ref 65–99)
Glucose-Capillary: 139 mg/dL — ABNORMAL HIGH (ref 65–99)

## 2017-04-05 LAB — CREATININE, SERUM
CREATININE: 0.79 mg/dL (ref 0.61–1.24)
GFR calc Af Amer: >60 mL/min (ref >60)

## 2017-04-05 LAB — MAGNESIUM
MAGNESIUM: 2.3 mg/dL (ref 1.7–2.4)
MAGNESIUM: 2.2 mg/dL (ref 1.7–2.4)

## 2017-04-05 IMAGING — DX DG CHEST 1V PORT
1 series · 1 of 1 positions shown · non-contrast
Comparison: Yesterday

CLINICAL DATA: Status post CABG

EXAM:
PORTABLE CHEST 1 VIEW

[chest ap]
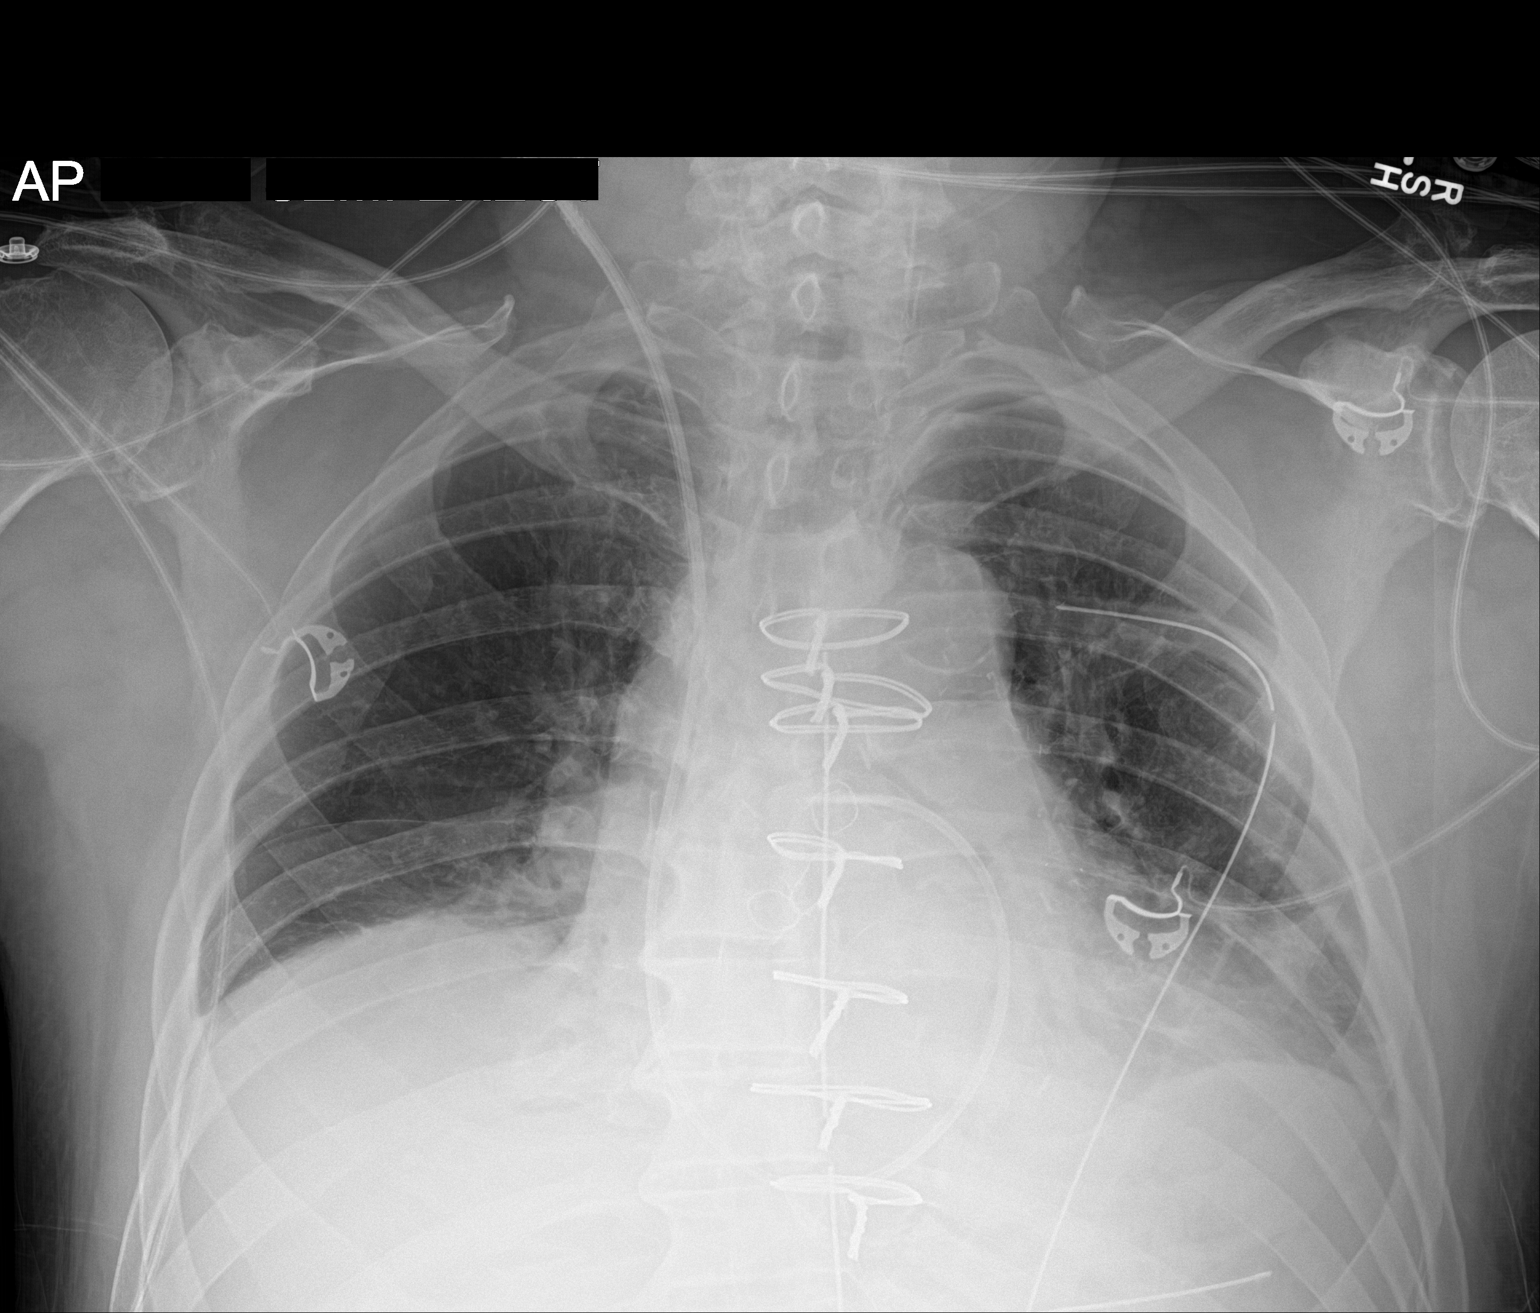

[1 of 1 positions shown; findings below may reference images not displayed]

FINDINGS: Thoracic drains remain. The trachea and esophagus have been
extubated. Swan-Ganz catheter from right IJ approach with tip at the
right main pulmonary artery. Mildly improved visualization/ aeration
at the left base. Lung volumes are still low and there is
atelectasis over the diaphragm. No pneumothorax or pulmonary edema.
Stable postoperative heart size.
IMPRESSION: 1. Stable positioning of remaining tubes and central line.
2. Mildly improved aeration but still low volumes and multi segment
atelectasis.
3. No visible pneumothorax.

## 2017-04-05 MED ORDER — DIPHENHYDRAMINE HCL 25 MG PO CAPS
25.0000 mg | ORAL_CAPSULE | Freq: Three times a day (TID) | ORAL | Status: DC | PRN
  Administered 2017-04-05: 25 mg via ORAL
  Filled 2017-04-05: qty 1

## 2017-04-05 MED ORDER — ENOXAPARIN SODIUM 40 MG/0.4ML ~~LOC~~ SOLN
40.0000 mg | Freq: Every day | SUBCUTANEOUS | Status: DC
  Administered 2017-04-05 – 2017-04-07 (×3): 40 mg via SUBCUTANEOUS
  Filled 2017-04-05 (×3): qty 0.4

## 2017-04-05 MED FILL — Electrolyte-R (PH 7.4) Solution: INTRAVENOUS | Qty: 3000 | Status: AC

## 2017-04-05 MED FILL — Lidocaine HCl IV Inj 20 MG/ML: INTRAVENOUS | Qty: 5 | Status: AC

## 2017-04-05 MED FILL — Heparin Sodium (Porcine) Inj 1000 Unit/ML: INTRAMUSCULAR | Qty: 10 | Status: AC

## 2017-04-05 MED FILL — Mannitol IV Soln 20%: INTRAVENOUS | Qty: 500 | Status: AC

## 2017-04-05 MED FILL — Sodium Bicarbonate IV Soln 8.4%: INTRAVENOUS | Qty: 50 | Status: AC

## 2017-04-05 MED FILL — Sodium Chloride IV Soln 0.9%: INTRAVENOUS | Qty: 2000 | Status: AC

## 2017-04-05 NOTE — Discharge Instructions (Signed)
Coronary Artery Bypass Grafting, Care After This sheet gives you information about how to care for yourself after your procedure. Your health care provider may also give you more specific instructions. If you have problems or questions, contact your health care provider. What can I expect after the procedure? After the procedure, it is common to have:  Nausea and a lack of appetite.  Constipation.  Weakness and fatigue.  Depression or irritability.  Pain or discomfort in your incision areas.  Follow these instructions at home: Medicines  Take over-the-counter and prescription medicines only as told by your health care provider. Do not stop taking medicines or start any new medicines without approval from your health care provider.  If you were prescribed an antibiotic medicine, take it as told by your health care provider. Do not stop taking the antibiotic even if you start to feel better.  Do not drive or use heavy machinery while taking prescription pain medicine. Incision care  Follow instructions from your health care provider about how to take care of your incisions. Make sure you: ? Wash your hands with soap and water before you change your bandage (dressing). If soap and water are not available, use hand sanitizer. ? Change your dressing as told by your health care provider. ? Leave stitches (sutures), skin glue, or adhesive strips in place. These skin closures may need to stay in place for 2 weeks or longer. If adhesive strip edges start to loosen and curl up, you may trim the loose edges. Do not remove adhesive strips completely unless your health care provider tells you to do that.  Keep incision areas clean, dry, and protected.  Check your incision areas every day for signs of infection. Check for: ? More redness, swelling, or pain. ? More fluid or blood. ? Warmth. ? Pus or a bad smell.  If incisions were made in your legs: ? Avoid crossing your legs. ? Avoid  sitting for long periods of time. Change positions every 30 minutes. ? Raise (elevate) your legs when you are sitting. Bathing  Do not take baths, swim, or use a hot tub until your health care provider approves.  Only take sponge baths. Pat the incisions dry. Do not rub incisions with a washcloth or towel.  Ask your health care provider when you can shower. Eating and drinking  Eat foods that are high in fiber, such as raw fruits and vegetables, whole grains, beans, and nuts. Meats should be lean cut. Avoid canned, processed, and fried foods. This can help prevent constipation and is a recommended part of a heart-healthy diet.  Drink enough fluid to keep your urine clear or pale yellow.  Limit alcohol intake to no more than 1 drink a day for nonpregnant women and 2 drinks a day for men. One drink equals 12 oz of beer, 5 oz of wine, or 1 oz of hard liquor. Activity  Rest and limit your activity as told by your health care provider. You may be instructed to: ? Stop any activity right away if you have chest pain, shortness of breath, irregular heartbeats, or dizziness. Get help right away if you have any of these symptoms. ? Move around frequently for short periods or take short walks as directed by your health care provider. Gradually increase your activities. You may need physical therapy or cardiac rehabilitation to help strengthen your muscles and build your endurance. ? Avoid lifting, pushing, or pulling anything that is heavier than 10 lb (4.5 kg) for at  least 6 weeks or as told by your health care provider. °· Do not drive until your health care provider approves. °· Ask your health care provider when you may return to work. °· Ask your health care provider when you may resume sexual activity. °General instructions °· Do not use any products that contain nicotine or tobacco, such as cigarettes and e-cigarettes. If you need help quitting, ask your health care provider. °· Take 2-3 deep  breaths every few hours during the day, while you recover. This helps expand your lungs and prevent complications like pneumonia after surgery. °· If you were given a device called an incentive spirometer, use it several times a day to practice deep breathing. Support your chest with a pillow or your arms when you take deep breaths or cough. °· Wear compression stockings as told by your health care provider. These stockings help to prevent blood clots and reduce swelling in your legs. °· Weigh yourself every day. This helps identify if your body is holding (retaining) fluid that may make your heart and lungs work harder. °· Keep all follow-up visits as told by your health care provider. This is important. °Contact a health care provider if: °· You have more redness, swelling, or pain around any incision. °· You have more fluid or blood coming from any incision. °· Any incision feels warm to the touch. °· You have pus or a bad smell coming from any incision °· You have a fever. °· You have swelling in your ankles or legs. °· You have pain in your legs. °· You gain 2 lb (0.9 kg) or more a day. °· You are nauseous or you vomit. °· You have diarrhea. °Get help right away if: °· You have chest pain that spreads to your jaw or arms. °· You are short of breath. °· You have a fast or irregular heartbeat. °· You notice a "clicking" in your breastbone (sternum) when you move. °· You have numbness or weakness in your arms or legs. °· You feel dizzy or light-headed. °Summary °· After the procedure, it is common to have pain or discomfort in the incision areas. °· Do not take baths, swim, or use a hot tub until your health care provider approves. °· Gradually increase your activities. You may need physical therapy or cardiac rehabilitation to help strengthen your muscles and build your endurance. °· Weigh yourself every day. This helps identify if your body is holding (retaining) fluid that may make your heart and lungs work  harder. °This information is not intended to replace advice given to you by your health care provider. Make sure you discuss any questions you have with your health care provider. °Document Released: 02/23/2005 Document Revised: 06/25/2016 Document Reviewed: 06/25/2016 °Elsevier Interactive Patient Education © 2018 [employer] ° ° °Endoscopic Saphenous Vein Harvesting, Care After °Refer to this sheet in the next few weeks. These instructions provide you with information about caring for yourself after your procedure. Your health care provider may also give you more specific instructions. Your treatment has been planned according to current medical practices, but problems sometimes occur. Call your health care provider if you have any problems or questions after your procedure. °What can I expect after the procedure? °After the procedure, it is common to have: °· Pain. °· Bruising. °· Swelling. °· Numbness. ° °Follow these instructions at home: °Medicine °· Take over-the-counter and prescription medicines only as told by your health care provider. °· Do not drive or operate heavy machinery   while taking prescription pain medicine. °Incision care ° °· Follow instructions from your health care provider about how to take care of the cut made during surgery (incision). Make sure you: °? Wash your hands with soap and water before you change your bandage (dressing). If soap and water are not available, use hand sanitizer. °? Change your dressing as told by your health care provider. °? Leave stitches (sutures), skin glue, or adhesive strips in place. These skin closures may need to be in place for 2 weeks or longer. If adhesive strip edges start to loosen and curl up, you may trim the loose edges. Do not remove adhesive strips completely unless your health care provider tells you to do that. °· Check your incision area every day for signs of infection. Check for: °? More redness, swelling, or pain. °? More fluid or  blood. °? Warmth. °? Pus or a bad smell. °General instructions °· Raise (elevate) your legs above the level of your heart while you are sitting or lying down. °· Do any exercises your health care providers have given you. These may include deep breathing, coughing, and walking exercises. °· Do not shower, take baths, swim, or use a hot tub unless told by your health care provider. °· Wear your elastic stocking if told by your health care provider. °· Keep all follow-up visits as told by your health care provider. This is important. °Contact a health care provider if: °· Medicine does not help your pain. °· Your pain gets worse. °· You have new leg bruises or your leg bruises get bigger. °· You have a fever. °· Your leg feels numb. °· You have more redness, swelling, or pain around your incision. °· You have more fluid or blood coming from your incision. °· Your incision feels warm to the touch. °· You have pus or a bad smell coming from your incision. °Get help right away if: °· Your pain is severe. °· You develop pain, tenderness, warmth, redness, or swelling in any part of your leg. °· You have chest pain. °· You have trouble breathing. °This information is not intended to replace advice given to you by your health care provider. Make sure you discuss any questions you have with your health care provider. °Document Released: 04/18/2011 Document Revised: 01/12/2016 Document Reviewed: 06/20/2015 °Elsevier Interactive Patient Education © 2018 [employer] ° ° °

## 2017-04-05 NOTE — Discharge Summary (Signed)
Physician Discharge Summary  Patient ID: Jordan [name] MRN: [id number] DOB/AGE: [birth year] 76 y.o.  Admit date: 04/04/2017 Discharge date: 04/08/2017  Admission Diagnoses:  Patient Active Problem List   Diagnosis Date Noted  . Abnormal nuclear stress test 03/05/2017  . Bibasilar crackles 02/26/2017  . History of wheezing 02/26/2017  . Mediastinal adenopathy 02/26/2017  . Essential hypertension 01/29/2017  . Dyspnea on exertion 01/28/2017  . Chest pain with moderate risk for cardiac etiology 03/31/2014  . Hyperlipidemia 03/31/2014  . GERD (gastroesophageal reflux disease) 03/31/2014  . ED (erectile dysfunction) 03/31/2014  . Carotid stenosis, asymptomatic 07/27/2011   Discharge Diagnoses:   Patient Active Problem List   Diagnosis Date Noted  . 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 Abnormal nuclear stress test 03/05/2017  . Bibasilar crackles 02/26/2017  . History of wheezing 02/26/2017  . Mediastinal adenopathy 02/26/2017  . Essential hypertension 01/29/2017  . Dyspnea on exertion 01/28/2017  . Chest pain with moderate risk for cardiac etiology 03/31/2014  . Hyperlipidemia 03/31/2014  . GERD (gastroesophageal reflux disease) 03/31/2014  . ED (erectile dysfunction) 03/31/2014  . Carotid stenosis, asymptomatic 07/27/2011   Discharged Condition: good  History of Present Illness:   Jordan [name] is a 76 yo white male with known history of Hypertension and Hyperlipidemia who presented to his primary care physician with complaints of shortness of breath with exertion, most notably when walking up hills. 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  He underwent a stress test which was intermediate risk which was suggestive of ischemia in the distal anterior wall.  He underwent cardiac catheterization which showed coronary artery disease, with a preserved EF of 55%.  It was felt coronary bypass grafting would be indicated and  the patient was referred to [facility] for possible coronary bypass grafting.  He was evaluated by Dr. Cyndia Bent who was in agreement coronary bypass grafting would be beneficial to the patient.  The risks and benefits of the procedure were explained to the patient and he was agreeable to proceed.  Hospital Course:   Jordan [name] presented to [facility] on 04/04/2017.  He was taken to the operating room and underwent CABG x 4 utilizing LIMA to LAD, SVG to Diagonal 2, SVG to OM, and SVG to PDA.  He also underwent endoscopic harvest greater saphenous vein from his right leg.  He tolerated the procedure without difficulty and was taken to the SICU in stable condition.  The patient was extubated the evening of surgery.  During his stay in the SICU the patient was weaned off Neo-synephrine as tolerated.  His chest tubes and arterial lines were removed without difficulty.  He was transferred to the step down unit. There, he continued to progress. He was walking independently and using his incentive spirometer. He was weaned off oxygen. He is tolerating room air, ambulating without assistance, his incisions are healing well, and he is ready for discharge.   Significant Diagnostic Studies: angiography:    LM lesion, 55 %stenosed.  Prox LAD to Mid LAD lesion, 75 %stenosed. Mid-Distal LAD lesion, 75 %stenosed.  Moderate, non-obstructive m-dRCA disease.  The left ventricular systolic function is normal. The left ventricular ejection fraction is 55-65% by visual estimate.  LV end diastolic pressure is normal.  The presence of the radial arterial loop made catheter exchange difficult, would not recommend radial catheterizations in the future.   Severe disease involving the left  main coronary artery and proximal to mid LAD. Based on the distal left main disease and calcified LAD disease, he is better served with CABG.   Treatments: surgery:   1. Median Sternotomy 2. Extracorporeal circulation 3.    Coronary artery bypass grafting x 4   Left internal mammary graft to the LAD  SVG to Diagonal 2  SVG to OM  SVG to PDA 4.   Endoscopic vein harvest from the right leg  Disposition: 01-Home or Self Care   Discharge medications:  The patient has been discharged on:   1.Beta Blocker:  Yes [ x  ]                              No   [   ]                              If No, reason:  2.Ace Inhibitor/ARB: Yes [   ]                                     No  [  x  ]                                     If No, reason: titration of beta blocker  3.Statin:   Yes [ x  ]                  No  [   ]                  If No, reason:  4.Ecasa:  Yes  [ x  ]                  No   [   ]                  If No, reason:     Discharge Instructions    Discharge patient    Complete by:  As directed    Discharge disposition:  01-Home or Self Care   Discharge patient date:  04/08/2017     Allergies as of 04/08/2017      Reactions   Crestor [rosuvastatin Calcium] Other (See Comments)   Muscle aches      Medication List    STOP taking these medications   Fish Oil 1000 MG Caps   naproxen sodium 220 MG tablet Commonly known as:  ANAPROX   Pitavastatin Calcium 1 MG Tabs Replaced by:  pravastatin 20 MG tablet   tadalafil 20 MG tablet Commonly known as:  CIALIS   valsartan 80 MG tablet Commonly known as:  DIOVAN     TAKE these medications   aspirin 325 MG EC tablet Take 1 tablet (325 mg total) by mouth daily. What changed:  medication strength  how much to take   BIOFREEZE EX Apply 1 application topically 3 (three) times daily as needed (for shoulder/lower back pain.).   CENTRUM SILVER PO Take 1 tablet by mouth daily. What changed:  Another medication with the same name was removed. Continue taking this medication, and follow the directions you see here.   cholecalciferol 1000 units tablet Commonly known as:  VITAMIN D  Take 1,000 Units by mouth 2 (two) times daily.    CITRACAL MAXIMUM 315-250 MG-UNIT Tabs Generic drug:  Calcium Citrate-Vitamin D Take 1 tablet by mouth 2 (two) times daily.   diphenhydramine-acetaminophen 25-500 MG Tabs tablet Commonly known as:  TYLENOL PM Take 0.5 tablets by mouth at bedtime as needed (for sleep.).   ezetimibe 10 MG tablet Commonly known as:  ZETIA Take 10 mg by mouth every evening.   furosemide 40 MG tablet Commonly known as:  LASIX Take 1 tablet (40 mg total) by mouth daily.   metoprolol tartrate 25 MG tablet Commonly known as:  LOPRESSOR Take 0.5 tablets (12.5 mg total) by mouth 2 (two) times daily.   potassium chloride SA 20 MEQ tablet Commonly known as:  K-DUR,KLOR-CON Take 1 tablet (20 mEq total) by mouth daily.   pravastatin 20 MG tablet Commonly known as:  PRAVACHOL Take 1 tablet (20 mg total) by mouth daily at 6 PM. Replaces:  Pitavastatin Calcium 1 MG Tabs   ranitidine 150 MG tablet Commonly known as:  ZANTAC Take 150 mg by mouth 2 (two) times daily.   VITAMIN B-12 PO Take 1 tablet by mouth every evening.      Follow-up Information    Gaye Pollack, MD Follow up on 05/15/2017.   Specialty:  Cardiothoracic Surgery Why:  Appointment is at 10:00, please get CXR at 9:30 at [place] located on first floor of our office building  Contact information: [place] [address] [phone]        Crist Infante, MD. Call in 1 day(s).   Specialty:  Internal Medicine Contact information: [place] [address] [phone]        nursing Follow up.   Why:  Please arrive at 10:30 on Monday 04/15/2017 for a suture removal  Contact information: Dr. Vivi Martens office.        Brand Males, MD Follow up.   Specialty:  Pulmonary Disease Why:  Appointment on 06/18/2017 at 9:30am.  Contact information: [place] [address] [phone]           Signed: Elgie Collard 04/08/2017, 8:19 AM

## 2017-04-05 NOTE — Progress Notes (Signed)
Patient ID: BASSAM DRESCH, male   DOB: [birth year], 75 y.o.   MRN: [id number] EVENING ROUNDS NOTE :     [address]       [place] [address]             [phone]                 1 Day Post-Op Procedure(s) (LRB): CORONARY ARTERY BYPASS GRAFTING times  four  with left internal mammary artery and right leg saphenous vein (N/A) TRANSESOPHAGEAL ECHOCARDIOGRAM (TEE) (N/A)  Total Length of Stay:  LOS: 1 day  BP (!) 130/55   Pulse 75   Temp 98.3 F (36.8 C) (Oral)   Resp (!) 24   Ht 5\' 7"  (1.702 m)   Wt 190 lb 7.6 oz (86.4 kg)   SpO2 (!) 88%   BMI 29.83 kg/m   .Intake/Output      08/16 0701 - 08/17 0700 08/17 0701 - 08/18 0700   P.O.  600   I.V. (mL/kg) 3248.4 (37.6) 357.9 (4.1)   Blood 467    IV Piggyback 1750    Total Intake(mL/kg) 5465.4 (63.3) 957.9 (11.1)   Urine (mL/kg/hr) 2230 (1.1) 410 (0.4)   Blood 1000    Chest Tube 710 70   Total Output 3940 480   Net +1525.4 +477.9          . sodium chloride Stopped (04/05/17 0930)  . sodium chloride    . sodium chloride Stopped (04/04/17 2000)  . cefUROXime (ZINACEF)  IV Stopped (04/05/17 0630)  . lactated ringers    . lactated ringers Stopped (04/05/17 0700)  . lactated ringers 20 mL/hr at 04/05/17 0700  . nitroGLYCERIN Stopped (04/04/17 1700)  . phenylephrine (NEO-SYNEPHRINE) Adult infusion Stopped (04/05/17 1311)     Lab Results  Component Value Date   WBC 10.3 04/05/2017   HGB 10.5 (L) 04/05/2017   HCT 31.0 (L) 04/05/2017   PLT 157 04/05/2017   GLUCOSE 139 (H) 04/05/2017   ALT 16 (L) 04/02/2017   AST 19 04/02/2017   NA 137 04/05/2017   K 3.9 04/05/2017   CL 99 (L) 04/05/2017   CREATININE 0.60 (L) 04/05/2017   BUN 13 04/05/2017   CO2 23 04/05/2017   INR 1.31 04/04/2017   HGBA1C 5.7 (H) 04/02/2017   Awake and alert neuro intact Walked in unit   Grace Isaac MD  Beeper [phone] Office [phone] 04/05/2017 6:51 PM

## 2017-04-05 NOTE — Progress Notes (Signed)
1 Day Post-Op Procedure(s) (LRB): CORONARY ARTERY BYPASS GRAFTING times  four  with left internal mammary artery and right leg saphenous vein (N/A) TRANSESOPHAGEAL ECHOCARDIOGRAM (TEE) (N/A) Subjective:  No complaints  Objective: Vital signs in last 24 hours: Temp:  [97 F (36.1 C)-100 F (37.8 C)] 98.6 F (37 C) (08/17 0700) Pulse Rate:  [73-90] 73 (08/17 0700) Cardiac Rhythm: Normal sinus rhythm (08/17 0700) Resp:  [10-24] 17 (08/17 0700) BP: (82-131)/(62-89) 112/73 (08/17 0700) SpO2:  [92 %-100 %] 100 % (08/17 0700) Arterial Line BP: (87-141)/(45-81) 108/65 (08/17 0700) FiO2 (%):  [40 %-50 %] 40 % (08/16 1838) Weight:  [86.4 kg (190 lb 7.6 oz)] 86.4 kg (190 lb 7.6 oz) (08/17 0500)  Hemodynamic parameters for last 24 hours: PAP: (19-47)/(6-32) 26/9 CO:  [3.6 L/min-5.8 L/min] 5.7 L/min CI:  [1.9 L/min/m2-3.1 L/min/m2] 3 L/min/m2  Intake/Output from previous day: 08/16 0701 - 08/17 0700 In: 5465.4 [I.V.:3248.4; Blood:467; IV AUQJFHLKT:6256] Out: 3893 [Urine:2230; Blood:1000; Chest Tube:710] Intake/Output this shift: No intake/output data recorded.  General appearance: alert and cooperative Neurologic: intact Heart: regular rate and rhythm, S1, S2 normal, no murmur, click, rub or gallop Lungs: clear to auscultation bilaterally Extremities: extremities normal, atraumatic, no cyanosis or edema Wound: dressing dry  Lab Results:  Recent Labs  04/04/17 1909 04/04/17 1921 04/05/17 0308  WBC 11.7*  --  9.8  HGB 12.8* 11.6* 11.3*  HCT 36.9* 34.0* 33.2*  PLT 194  --  193   BMET:  Recent Labs  04/02/17 1026  04/04/17 1921 04/05/17 0308  NA 139  < > 140 137  K 4.5  < > 5.0 4.1  CL 109  < > 105 107  CO2 21*  --   --  23  GLUCOSE 100*  < > 164* 108*  BUN 17  < > 17 15  CREATININE 0.80  < > 0.90 0.97  CALCIUM 9.0  --   --  7.6*  < > = values in this interval not displayed.  PT/INR:  Recent Labs  04/04/17 1322  LABPROT 16.4*  INR 1.31   ABG    Component  Value Date/Time   PHART 7.355 04/04/2017 2005   HCO3 23.7 04/04/2017 2005   TCO2 25 04/04/2017 2005   ACIDBASEDEF 2.0 04/04/2017 2005   O2SAT 97.0 04/04/2017 2005   CBG (last 3)   Recent Labs  04/04/17 2303 04/05/17 0314 04/05/17 0740  GLUCAP 96 102* 101*   CXR: clear  ECG: sinus rhythm, early repol  Assessment/Plan: S/P Procedure(s) (LRB): CORONARY ARTERY BYPASS GRAFTING times  four  with left internal mammary artery and right leg saphenous vein (N/A) TRANSESOPHAGEAL ECHOCARDIOGRAM (TEE) (N/A)  He is hemodynamically stable in sinus rhythm on neo. Will hold beta blocker and wean neo as tolerated.  DC chest tubes, swan, arterial line.  Glucose is ok and no hx of DM with Hgb A!c of 5.7. Will discontinue SSI  OOB, ambulate, IS   LOS: 1 day    Gaye Pollack 04/05/2017

## 2017-04-05 NOTE — Care Management Note (Signed)
Case Management Note Marvetta Gibbons RN, BSN Unit 4E-Case Manager-- [place] coverage [phone]  Patient Details  Name: Jordan [name] MRN: [id number] Date of Birth: 1940-10-12  Subjective/Objective:  Pt admitted s/p CABGx4 on 04/04/17  Post op day 1- plan to d/c chest tubes, swan, and aline                Action/Plan: PTA pt lived at home with wife- CM to follow for d/c needs  Expected Discharge Date:  04/11/17               Expected Discharge Plan:  Home/Self Care  In-House Referral:     Discharge planning Services  CM Consult  Post Acute Care Choice:    Choice offered to:     DME Arranged:    DME Agency:     HH Arranged:    [place] Agency:     Status of Service:  In process, will continue to follow  If discussed at Long Length of Stay Meetings, dates discussed:    Discharge Disposition:   Additional Comments:  Dawayne Patricia, RN 04/05/2017, 10:30 AM

## 2017-04-06 ENCOUNTER — Inpatient Hospital Stay (HOSPITAL_COMMUNITY): Payer: Medicare Other

## 2017-04-06 LAB — BASIC METABOLIC PANEL
Anion gap: 5 (ref 5–15)
BUN: 11 mg/dL (ref 6–20)
CALCIUM: 7.9 mg/dL — AB (ref 8.9–10.3)
CO2: 26 mmol/L (ref 22–32)
Chloride: 103 mmol/L (ref 101–111)
Creatinine, Ser: 0.73 mg/dL (ref 0.61–1.24)
GFR calc Af Amer: >60 mL/min (ref >60)
GLUCOSE: 119 mg/dL — AB (ref 65–99)
Potassium: 3.8 mmol/L (ref 3.5–5.1)
Sodium: 134 mmol/L — ABNORMAL LOW (ref 135–145)

## 2017-04-06 LAB — CBC
HCT: 31.5 % — ABNORMAL LOW (ref 39.0–52.0)
Hemoglobin: 10.7 g/dL — ABNORMAL LOW (ref 13.0–17.0)
MCH: 31.3 pg (ref 26.0–34.0)
MCHC: 34 g/dL (ref 30.0–36.0)
MCV: 92.1 fL (ref 78.0–100.0)
Platelets: 131 10*3/uL — ABNORMAL LOW (ref 150–400)
RBC: 3.42 MIL/uL — ABNORMAL LOW (ref 4.22–5.81)
RDW: 13.3 % (ref 11.5–15.5)
WBC: 10.5 10*3/uL (ref 4.0–10.5)

## 2017-04-06 IMAGING — CR DG CHEST 1V PORT
1 series · 1 of 1 positions shown · non-contrast
Comparison: [DATE]

CLINICAL DATA: Status post CABG surgery.

EXAM:
PORTABLE CHEST 1 VIEW

[AP]
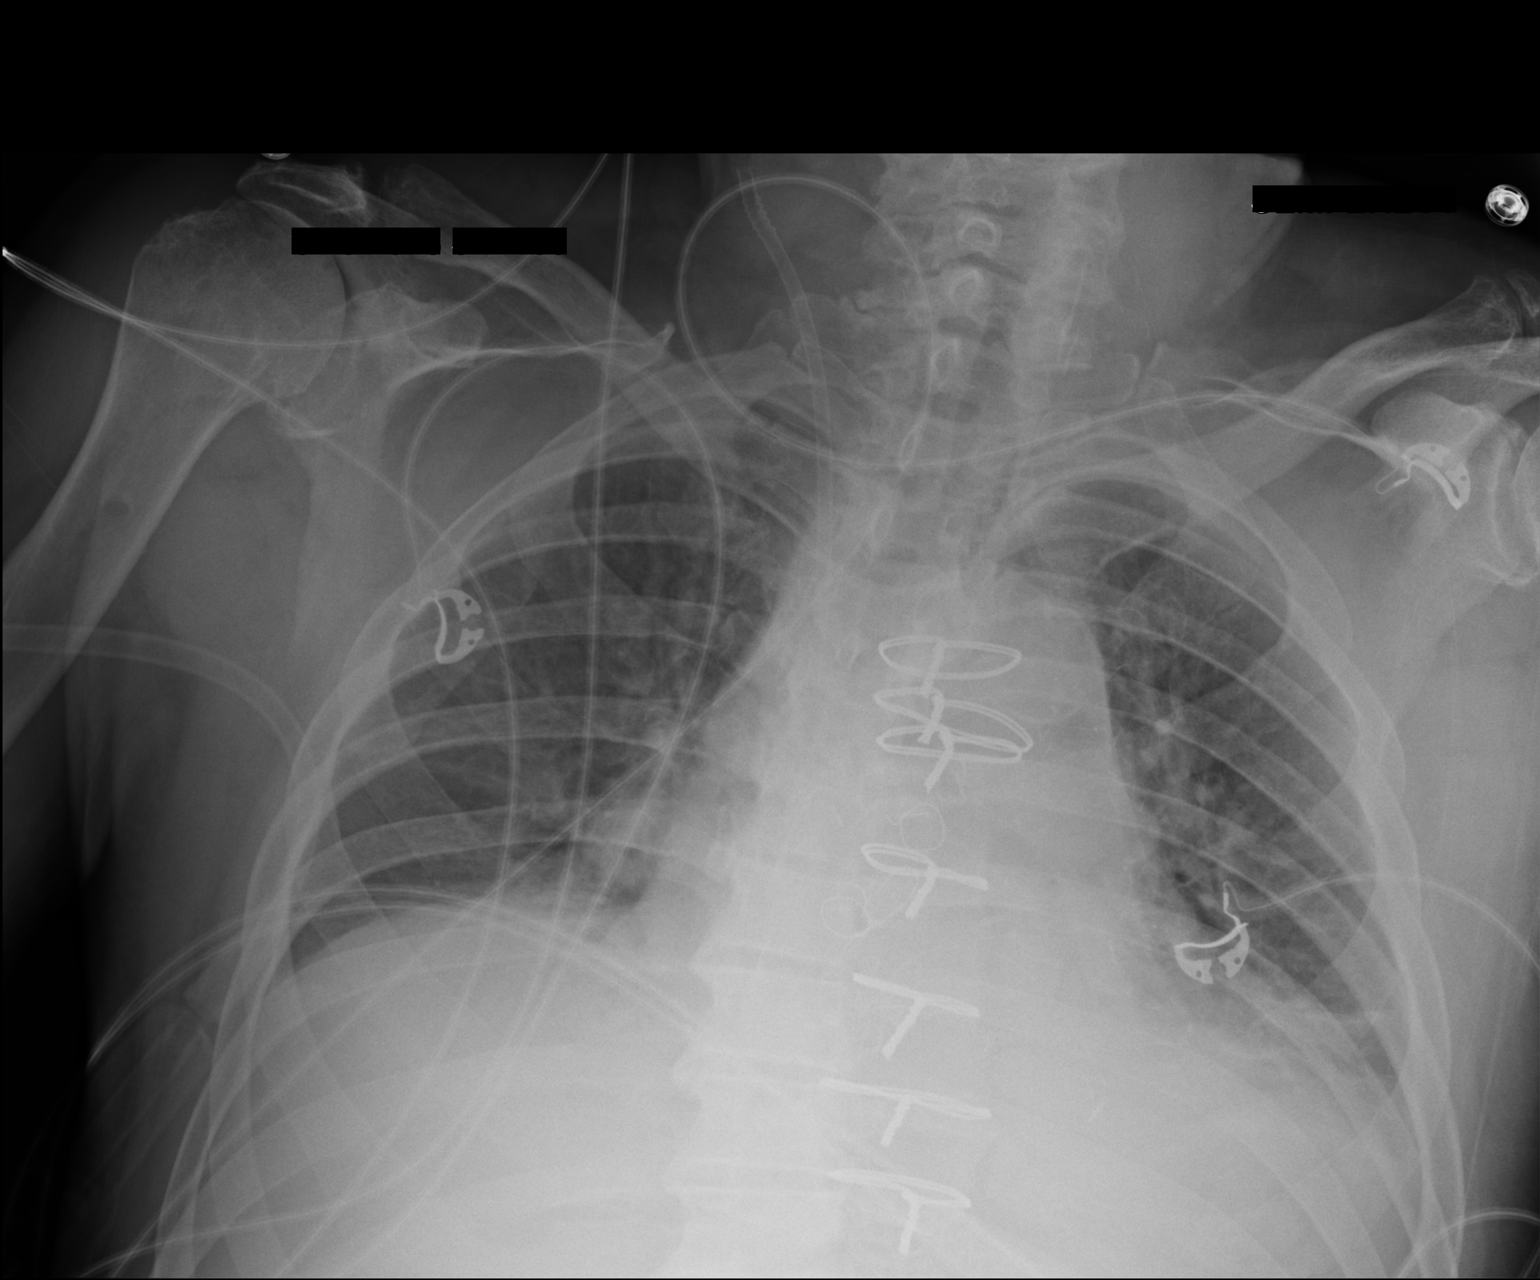

[1 of 1 positions shown; findings below may reference images not displayed]

FINDINGS: Since prior study, all lines and tubes have been removed with the
exception of the right internal jugular introducer sheath.

There is persistent left greater than right lung base opacity
consistent with atelectasis. This is accentuated by low lung
volumes. Remainder of the lungs is clear.

No pneumothorax.  No mediastinal widening.
IMPRESSION: 1. No acute findings or evidence of an operative complication.
2. Persistent mild, left greater than right, lung base atelectasis.

## 2017-04-06 MED ORDER — ACETAMINOPHEN 325 MG PO TABS
650.0000 mg | ORAL_TABLET | Freq: Four times a day (QID) | ORAL | Status: DC | PRN
  Administered 2017-04-06: 650 mg via ORAL
  Filled 2017-04-06: qty 2

## 2017-04-06 MED ORDER — BISACODYL 5 MG PO TBEC: 10.0000 mg | DELAYED_RELEASE_TABLET | Freq: Every day | ORAL | Status: DC | PRN

## 2017-04-06 MED ORDER — POTASSIUM CHLORIDE CRYS ER 20 MEQ PO TBCR
20.0000 meq | EXTENDED_RELEASE_TABLET | Freq: Two times a day (BID) | ORAL | Status: DC
  Administered 2017-04-06 – 2017-04-08 (×5): 20 meq via ORAL
  Filled 2017-04-06 (×5): qty 1

## 2017-04-06 MED ORDER — PANTOPRAZOLE SODIUM 40 MG PO TBEC
40.0000 mg | DELAYED_RELEASE_TABLET | Freq: Every day | ORAL | Status: DC
  Administered 2017-04-07 – 2017-04-08 (×2): 40 mg via ORAL
  Filled 2017-04-06 (×2): qty 1

## 2017-04-06 MED ORDER — TRAMADOL HCL 50 MG PO TABS: 50.0000 mg | ORAL_TABLET | ORAL | Status: DC | PRN

## 2017-04-06 MED ORDER — FUROSEMIDE 40 MG PO TABS
40.0000 mg | ORAL_TABLET | Freq: Every day | ORAL | Status: DC
  Administered 2017-04-06 – 2017-04-08 (×3): 40 mg via ORAL
  Filled 2017-04-06 (×3): qty 1

## 2017-04-06 MED ORDER — MOVING RIGHT ALONG BOOK
Freq: Once | Status: AC
  Administered 2017-04-06: 1
  Filled 2017-04-06: qty 1

## 2017-04-06 MED ORDER — ONDANSETRON HCL 4 MG PO TABS: 4.0000 mg | ORAL_TABLET | Freq: Four times a day (QID) | ORAL | Status: DC | PRN

## 2017-04-06 MED ORDER — DIPHENHYDRAMINE HCL 25 MG PO CAPS
25.0000 mg | ORAL_CAPSULE | Freq: Every evening | ORAL | Status: DC | PRN
  Administered 2017-04-07: 25 mg via ORAL
  Filled 2017-04-06: qty 1

## 2017-04-06 MED ORDER — METOPROLOL TARTRATE 12.5 MG HALF TABLET
12.5000 mg | ORAL_TABLET | Freq: Two times a day (BID) | ORAL | Status: DC
  Administered 2017-04-06 – 2017-04-08 (×4): 12.5 mg via ORAL
  Filled 2017-04-06 (×4): qty 1

## 2017-04-06 MED ORDER — SODIUM CHLORIDE 0.9% FLUSH
3.0000 mL | Freq: Two times a day (BID) | INTRAVENOUS | Status: DC
  Administered 2017-04-07 (×2): 3 mL via INTRAVENOUS

## 2017-04-06 MED ORDER — BISACODYL 10 MG RE SUPP
10.0000 mg | Freq: Every day | RECTAL | Status: DC | PRN
Start: 2017-04-06 — End: 2017-04-08

## 2017-04-06 MED ORDER — ONDANSETRON HCL 4 MG/2ML IJ SOLN: 4.0000 mg | Freq: Four times a day (QID) | INTRAMUSCULAR | Status: DC | PRN

## 2017-04-06 MED ORDER — ASPIRIN EC 325 MG PO TBEC
325.0000 mg | DELAYED_RELEASE_TABLET | Freq: Every day | ORAL | Status: DC
  Administered 2017-04-07 – 2017-04-08 (×2): 325 mg via ORAL
  Filled 2017-04-06 (×2): qty 1

## 2017-04-06 MED ORDER — SODIUM CHLORIDE 0.9% FLUSH: 3.0000 mL | INTRAVENOUS | Status: DC | PRN

## 2017-04-06 MED ORDER — OXYCODONE HCL 5 MG PO TABS: 5.0000 mg | ORAL_TABLET | ORAL | Status: DC | PRN

## 2017-04-06 MED ORDER — SODIUM CHLORIDE 0.9 % IV SOLN: 250.0000 mL | INTRAVENOUS | Status: DC | PRN

## 2017-04-06 MED ORDER — DOCUSATE SODIUM 100 MG PO CAPS
200.0000 mg | ORAL_CAPSULE | Freq: Every day | ORAL | Status: DC
  Filled 2017-04-06 (×2): qty 2

## 2017-04-06 NOTE — Progress Notes (Signed)
Moved to [phone] with family and no distress noted.

## 2017-04-06 NOTE — Progress Notes (Signed)
Pt arrived to 4e from 2h. Vitals obtained. Pt oriented to room and staff. Pt denies needs at this time. Family at bedside. Will continue current plan of care.   Grant Fontana BSN, RN

## 2017-04-06 NOTE — Progress Notes (Signed)
2 Days Post-Op Procedure(s) (LRB): CORONARY ARTERY BYPASS GRAFTING times  four  with left internal mammary artery and right leg saphenous vein (N/A) TRANSESOPHAGEAL ECHOCARDIOGRAM (TEE) (N/A) Subjective: Did not sleep much due to noises  Objective: Vital signs in last 24 hours: Temp:  [98.3 F (36.8 C)-99 F (37.2 C)] 98.8 F (37.1 C) (08/18 0410) Pulse Rate:  [68-80] 76 (08/18 0500) Cardiac Rhythm: Normal sinus rhythm (08/18 0400) Resp:  [16-29] 27 (08/18 0700) BP: (110-143)/(55-86) 143/76 (08/18 0700) SpO2:  [87 %-100 %] 97 % (08/18 0500) Arterial Line BP: (111-153)/(38-71) 121/38 (08/17 1500) Weight:  [87.2 kg (192 lb 3.9 oz)] 87.2 kg (192 lb 3.9 oz) (08/18 0500)  Hemodynamic parameters for last 24 hours: PAP: (30)/(10) 30/10  Intake/Output from previous day: 08/17 0701 - 08/18 0700 In: 1247.9 [P.O.:720; I.V.:477.9; IV Piggyback:50] Out: 680 [Urine:610; Chest Tube:70] Intake/Output this shift: No intake/output data recorded.  General appearance: alert and cooperative Neurologic: intact Heart: regular rate and rhythm, S1, S2 normal, no murmur, click, rub or gallop Lungs: diminished breath sounds bibasilar Extremities: extremities normal, atraumatic, no cyanosis or edema Wound: incisions ok  Lab Results:  Recent Labs  04/05/17 1505 04/05/17 1514 04/06/17 0315  WBC 10.3  --  10.5  HGB 11.3* 10.5* 10.7*  HCT 32.8* 31.0* 31.5*  PLT 157  --  131*   BMET:  Recent Labs  04/05/17 0308  04/05/17 1514 04/06/17 0315  NA 137  --  137 134*  K 4.1  --  3.9 3.8  CL 107  --  99* 103  CO2 23  --   --  26  GLUCOSE 108*  --  139* 119*  BUN 15  --  13 11  CREATININE 0.97  < > 0.60* 0.73  CALCIUM 7.6*  --   --  7.9*  < > = values in this interval not displayed.  PT/INR:  Recent Labs  04/04/17 1322  LABPROT 16.4*  INR 1.31   ABG    Component Value Date/Time   PHART 7.355 04/04/2017 2005   HCO3 23.7 04/04/2017 2005   TCO2 22 04/05/2017 1514   ACIDBASEDEF 2.0  04/04/2017 2005   O2SAT 97.0 04/04/2017 2005   CBG (last 3)   Recent Labs  04/05/17 0314 04/05/17 0740 04/05/17 1127  GLUCAP 102* 101* 139*   CLINICAL DATA:  Status post CABG surgery.  EXAM: PORTABLE CHEST 1 VIEW  COMPARISON:  04/05/2017  FINDINGS: Since prior study, all lines and tubes have been removed with the exception of the right internal jugular introducer sheath.  There is persistent left greater than right lung base opacity consistent with atelectasis. This is accentuated by low lung volumes. Remainder of the lungs is clear.  No pneumothorax.  No mediastinal widening.  IMPRESSION: 1. No acute findings or evidence of an operative complication. 2. Persistent mild, left greater than right, lung base atelectasis.   Electronically Signed   By: Lajean Manes M.D.   On: 04/06/2017 07:53  Assessment/Plan: S/P Procedure(s) (LRB): CORONARY ARTERY BYPASS GRAFTING times  four  with left internal mammary artery and right leg saphenous vein (N/A) TRANSESOPHAGEAL ECHOCARDIOGRAM (TEE) (N/A)  He is doing well overall POD 2.  Start diuresis. Work on [place] sleeve Transfer to 4E and mobilize.   LOS: 2 days    Gaye Pollack 04/06/2017

## 2017-04-06 NOTE — Progress Notes (Signed)
This RN went into pt's room to give report to oncoming RN. Pt had pill bottles at bedside. Pt reported that he took 1/2 of a Tylenol PM pill. Pt is unsure of mg amount and pt does not have the original pill bottle. Pt was reminded that meds have to be administered by the RN. Oncoming RN notified.   Grant Fontana BSN, RN

## 2017-04-07 NOTE — Progress Notes (Addendum)
      [address]       [place] [address]             [phone]      3 Days Post-Op Procedure(s) (LRB): CORONARY ARTERY BYPASS GRAFTING times  four  with left internal mammary artery and right leg saphenous vein (N/A) TRANSESOPHAGEAL ECHOCARDIOGRAM (TEE) (N/A) Subjective: Feels good today. No issues overnight.   Objective: Vital signs in last 24 hours: Temp:  [97.5 F (36.4 C)-100.3 F (37.9 C)] 97.5 F (36.4 C) (08/19 0604) Pulse Rate:  [76-90] 81 (08/19 0604) Cardiac Rhythm: Normal sinus rhythm (08/18 1913) Resp:  [18-31] 26 (08/19 0604) BP: (103-140)/(63-85) 123/75 (08/19 0604) SpO2:  [90 %-95 %] 90 % (08/19 0604) Weight:  [84.4 kg (186 lb)] 84.4 kg (186 lb) (08/19 0500)     Intake/Output from previous day: 08/18 0701 - 08/19 0700 In: 660 [P.O.:610; IV Piggyback:50] Out: -  Intake/Output this shift: No intake/output data recorded.  General appearance: alert, cooperative and no distress Heart: regular rate and rhythm, S1, S2 normal, no murmur, click, rub or gallop Lungs: clear to auscultation bilaterally Abdomen: soft, non-tender; bowel sounds normal; no masses,  no organomegaly Extremities: extremities normal, atraumatic, no cyanosis or edema Wound: clean and dry  Lab Results:  Recent Labs  04/05/17 1505 04/05/17 1514 04/06/17 0315  WBC 10.3  --  10.5  HGB 11.3* 10.5* 10.7*  HCT 32.8* 31.0* 31.5*  PLT 157  --  131*   BMET:  Recent Labs  04/05/17 0308  04/05/17 1514 04/06/17 0315  NA 137  --  137 134*  K 4.1  --  3.9 3.8  CL 107  --  99* 103  CO2 23  --   --  26  GLUCOSE 108*  --  139* 119*  BUN 15  --  13 11  CREATININE 0.97  < > 0.60* 0.73  CALCIUM 7.6*  --   --  7.9*  < > = values in this interval not displayed.  PT/INR:  Recent Labs  04/04/17 1322  LABPROT 16.4*  INR 1.31   ABG    Component Value Date/Time   PHART 7.355 04/04/2017 2005   HCO3 23.7 04/04/2017 2005   TCO2 22 04/05/2017 1514   ACIDBASEDEF 2.0  04/04/2017 2005   O2SAT 97.0 04/04/2017 2005   CBG (last 3)   Recent Labs  04/05/17 0314 04/05/17 0740 04/05/17 1127  GLUCAP 102* 101* 139*    Assessment/Plan: S/P Procedure(s) (LRB): CORONARY ARTERY BYPASS GRAFTING times  four  with left internal mammary artery and right leg saphenous vein (N/A) TRANSESOPHAGEAL ECHOCARDIOGRAM (TEE) (N/A)  1. CV-NSR in the 80s, BP well controlled. On Zetia, Lopressor, and Pravastatin.  2. Pulm-tolerating room air with okay oxygen saturation. Continue incentive spirometry 3. Renal- creatinine stable at 0.73. Electrolytes ok. Weight continues to trend down.  4. H and H stable, thrombocytopenia will continue to monitor 5. Endo-blood glucose well controlled.  5. Lovenox for DVT proph  Plan: Discontinue EPW. Continue to use incentive spirometer. Continue to ambulate. Likely home tomorrow if remains stable.     LOS: 3 days    Elgie Collard 04/07/2017  Wires out  Poss home tomorrow  I have seen and examined R D [facility] and agree with the above assessment  and plan.  Grace Isaac MD Beeper [phone] Office [phone] 04/07/2017 12:23 PM

## 2017-04-07 NOTE — Progress Notes (Signed)
Patient ambulated in hallway with significant other. Will monitor patient. Lilias Lorensen, Bettina Gavia RN

## 2017-04-07 NOTE — Progress Notes (Signed)
Patient EPW pulled per protocol and as ordered. All ends intact slight resistance met. Patient tolerated well and reminded to lie supine approximately one hour. Pt bp 113/71 and NSR on monitor rate of 75. Will continue to monitor patient. Ajanay Farve, Bettina Gavia RN

## 2017-04-07 NOTE — Progress Notes (Signed)
Patient ambulated in hallway independently with wife. Will monitor patient. Darah Simkin, Bettina Gavia rN

## 2017-04-07 NOTE — Progress Notes (Signed)
Patient ambulated in hallway with wife this AM. Will monitor patient. Asheton Scheffler, Bettina Gavia rN

## 2017-04-08 MED ORDER — POTASSIUM CHLORIDE CRYS ER 20 MEQ PO TBCR: 20.0000 meq | EXTENDED_RELEASE_TABLET | Freq: Every day | ORAL | 0 refills | Status: DC

## 2017-04-08 MED ORDER — METOPROLOL TARTRATE 25 MG PO TABS: 12.5000 mg | ORAL_TABLET | Freq: Two times a day (BID) | ORAL | 1 refills | Status: DC

## 2017-04-08 MED ORDER — ASPIRIN 325 MG PO TBEC: 325.0000 mg | DELAYED_RELEASE_TABLET | Freq: Every day | ORAL | 0 refills | Status: DC

## 2017-04-08 MED ORDER — PRAVASTATIN SODIUM 20 MG PO TABS: 20.0000 mg | ORAL_TABLET | Freq: Every day | ORAL | 1 refills | Status: DC

## 2017-04-08 MED ORDER — FUROSEMIDE 40 MG PO TABS
40.0000 mg | ORAL_TABLET | Freq: Every day | ORAL | 0 refills | Status: DC
Start: 2017-04-08 — End: 2017-04-24

## 2017-04-08 NOTE — Progress Notes (Signed)
CARDIAC REHAB PHASE I   Pt ambulating independently, declines additional ambulation with cardiac rehab at this time, awaiting discharge. Cardiac surgery discharge education completed with pt and wife at bedside. Reviewed IS, sternal precautions, activity progression, exercise, heart healthy diet, daily weights and phase 2 cardiac rehab. Pt and wife verbalized understanding. Pt agrees to phase 2 cardiac rehab referral, will send to [facility] per pt request. Pt in recliner, call bell within reach.   [id number] Lenna Sciara, RN, BSN 04/08/2017 9:27 AM

## 2017-04-08 NOTE — Care Management Important Message (Signed)
Important Message  Patient Details  Name: Jordan [name] MRN: [id number] Date of Birth: [birth year]   Medicare Important Message Given:  Yes    Nathen May 04/08/2017, 11:32 AM

## 2017-04-08 NOTE — Care Management Note (Signed)
Case Management Note Marvetta Gibbons RN, BSN Unit 4E-Case Manager-- [employer] [place] coverage [phone]  Patient Details  Name: Jordan [name] MRN: [id number] Date of Birth: [birth year]  Subjective/Objective:  Pt admitted s/p CABGx4 on 04/04/17  Post op day 1- plan to d/c chest tubes, swan, and aline                Action/Plan: PTA pt lived at home with wife- CM to follow for d/c needs  Expected Discharge Date:  04/08/17               Expected Discharge Plan:  Home/Self Care  In-House Referral:  NA  Discharge planning Services  CM Consult  Post Acute Care Choice:  NA Choice offered to:  NA  DME Arranged:  N/A DME Agency:  NA  HH Arranged:  NA HH Agency:  NA  Status of Service:  Completed, signed off  If discussed at [place] of Stay Meetings, dates discussed:    Discharge Disposition: home/self care   Additional Comments:   04/08/17- 1030- Daviel Allegretto RN, CM- pt for d/c home today- no CM needs noted for discharge.  Dawayne Patricia, RN 04/08/2017, 10:30 AM

## 2017-04-08 NOTE — Progress Notes (Signed)
Patient given discharge instructions medication list and paper prescriptions. All questions were answered will discharge home as ordered. Chon [place], Bettina Gavia rN

## 2017-04-08 NOTE — Progress Notes (Addendum)
      [address]       [place] [address]             [phone]      4 Days Post-Op Procedure(s) (LRB): CORONARY ARTERY BYPASS GRAFTING times  four  with left internal mammary artery and right leg saphenous vein (N/A) TRANSESOPHAGEAL ECHOCARDIOGRAM (TEE) (N/A) Subjective: Slept better last night. Nursing was able to cluster his care.   Objective: Vital signs in last 24 hours: Temp:  [97.8 F (36.6 C)-100.3 F (37.9 C)] 98.9 F (37.2 C) (08/20 0441) Pulse Rate:  [70-84] 70 (08/20 0441) Cardiac Rhythm: Normal sinus rhythm (08/20 0100) Resp:  [17-28] 18 (08/20 0441) BP: (101-138)/(63-99) 115/74 (08/20 0441) SpO2:  [91 %-97 %] 91 % (08/20 0441) Weight:  [83.5 kg (184 lb 1.4 oz)] 83.5 kg (184 lb 1.4 oz) (08/20 0441)     Intake/Output from previous day: 08/19 0701 - 08/20 0700 In: 600 [P.O.:600] Out: -  Intake/Output this shift: No intake/output data recorded.  General appearance: alert, cooperative and no distress Heart: regular rate and rhythm, S1, S2 normal, no murmur, click, rub or gallop Lungs: clear to auscultation bilaterally Abdomen: soft, non-tender; bowel sounds normal; no masses,  no organomegaly Extremities: extremities normal, atraumatic, no cyanosis or edema Wound: clean and dry  Lab Results:  Recent Labs  04/05/17 1505 04/05/17 1514 04/06/17 0315  WBC 10.3  --  10.5  HGB 11.3* 10.5* 10.7*  HCT 32.8* 31.0* 31.5*  PLT 157  --  131*   BMET:  Recent Labs  04/05/17 1514 04/06/17 0315  NA 137 134*  K 3.9 3.8  CL 99* 103  CO2  --  26  GLUCOSE 139* 119*  BUN 13 11  CREATININE 0.60* 0.73  CALCIUM  --  7.9*    PT/INR: No results for input(s): LABPROT, INR in the last 72 hours. ABG    Component Value Date/Time   PHART 7.355 04/04/2017 2005   HCO3 23.7 04/04/2017 2005   TCO2 22 04/05/2017 1514   ACIDBASEDEF 2.0 04/04/2017 2005   O2SAT 97.0 04/04/2017 2005   CBG (last 3)   Recent Labs  04/05/17 0740 04/05/17 1127    GLUCAP 101* 139*    Assessment/Plan: S/P Procedure(s) (LRB): CORONARY ARTERY BYPASS GRAFTING times  four  with left internal mammary artery and right leg saphenous vein (N/A) TRANSESOPHAGEAL ECHOCARDIOGRAM (TEE) (N/A)  1. CV-NSR in the 70s, BP well controlled. EPW removed yesterday.  2. Pulm-tolerating room air. Continue incentive spirometry 3. Renal-creatinine stable, weight trending down 4. H and H stable, thrombocytopenia 5. Endo-blood glucose level well controlled.  6. Lovenox for DVT proph  Plan: discharge today. Remove chest tube sutures in the office.    LOS: 4 days    Elgie Collard 04/08/2017   Chart reviewed, patient examined, agree with above. He feels well and is ambulating without chest pain or shortness of breath. No arrhythmias. Plan home today.

## 2017-04-09 ENCOUNTER — Telehealth (HOSPITAL_COMMUNITY): Payer: Self-pay

## 2017-04-09 NOTE — Telephone Encounter (Signed)
Patient insurances are active and benefits verified. Patient insurances are  Medicare A/B and [place] A/B -  no co-payment, deductible $183.00/$183.00 has been met, 20% co-insurance, no out of pocket and no pre-authorization. Passport/reference [phone]. [employer] - no co-payment, no deductible, no out of pocket, no co-insurance and no pre-authorization. Passport/reference [phone].  Patient will be contacted and scheduled after their follow up appointment with the cardiologist on 04/24/17 and surgeon on 04/24/17, upon review by [facility] RN navigator.

## 2017-04-10 ENCOUNTER — Telehealth (HOSPITAL_COMMUNITY): Payer: Self-pay | Admitting: Physician Assistant

## 2017-04-10 NOTE — Telephone Encounter (Signed)
      [address]       [place] [address]             [phone]    R D Barrantes [phone]   S/P CABG x 4 performed on 8/16.  Discharged home on 8/20  Medications: Current Outpatient Prescriptions on File Prior to Visit  Medication Sig Dispense Refill  . aspirin 325 MG EC tablet Take 1 tablet (325 mg total) by mouth daily. 30 tablet 0  . Calcium Citrate-Vitamin D (CITRACAL MAXIMUM) 315-250 MG-UNIT TABS Take 1 tablet by mouth 2 (two) times daily.     . cholecalciferol (VITAMIN D) 1000 UNITS tablet Take 1,000 Units by mouth 2 (two) times daily.     . Cyanocobalamin (VITAMIN B-12 PO) Take 1 tablet by mouth every evening.     . diphenhydramine-acetaminophen (TYLENOL PM) 25-500 MG TABS tablet Take 0.5 tablets by mouth at bedtime as needed (for sleep.).    Marland Kitchen ezetimibe (ZETIA) 10 MG tablet Take 10 mg by mouth every evening.     . furosemide (LASIX) 40 MG tablet Take 1 tablet (40 mg total) by mouth daily. 3 tablet 0  . Menthol, Topical Analgesic, (BIOFREEZE EX) Apply 1 application topically 3 (three) times daily as needed (for shoulder/lower back pain.).    Marland Kitchen metoprolol tartrate (LOPRESSOR) 25 MG tablet Take 0.5 tablets (12.5 mg total) by mouth 2 (two) times daily. 30 tablet 1  . Multiple Vitamins-Minerals (CENTRUM SILVER PO) Take 1 tablet by mouth daily.    . potassium chloride SA (K-DUR,KLOR-CON) 20 MEQ tablet Take 1 tablet (20 mEq total) by mouth daily. 3 tablet 0  . pravastatin (PRAVACHOL) 20 MG tablet Take 1 tablet (20 mg total) by mouth daily at 6 PM. 30 tablet 1  . ranitidine (ZANTAC) 150 MG tablet Take 150 mg by mouth 2 (two) times daily.     No current facility-administered medications on file prior to visit.     Coumadin:  INR check Yes/No  Problems/Concerns: Mr. Ureta is doing very well. He was downtown today and is now resting. He has had no shortness of breath. He called our office and moved up his follow-up appointment with Dr. Cyndia Bent because he wants  to drive sooner. He is anxious to get back as soon as possible.   Assessment:  Patient is doing well. No issues at this point. Had no difficulty getting his medications. He stated that the discharge process was seamless however, he did states that the night time care at [facility] was dissatisfying. Nursing did try to cluster his care at night but he feels it is still inefficient. He has no other questions at this time.   Contact office if concerns or problems develop.   Follow up Appointment:   Dr. Vivi Martens office for suture removal on 8/27 Dr. Vivi Martens office 9/5 for follow-up Almyra Deforest 9/5 for cardiology follow-up   Nicholes Rough, PA-C

## 2017-04-10 NOTE — Anesthesia Postprocedure Evaluation (Signed)
Anesthesia Post Note  Patient: Jordan [name]  Procedure(s) Performed: Procedure(s) (LRB): CORONARY ARTERY BYPASS GRAFTING times  four  with left internal mammary artery and right leg saphenous vein (N/A) TRANSESOPHAGEAL ECHOCARDIOGRAM (TEE) (N/A)     Patient location during evaluation: SICU Anesthesia Type: General Level of consciousness: sedated Pain management: pain level controlled Vital Signs Assessment: post-procedure vital signs reviewed and stable Respiratory status: patient remains intubated per anesthesia plan Cardiovascular status: stable Anesthetic complications: no    Last Vitals:  Vitals:   04/08/17 0800 04/08/17 1101  BP: 131/67 114/64  Pulse: 78 77  Resp: 18   Temp: 37 C   SpO2: 92%     Last Pain:  Vitals:   04/08/17 0804  TempSrc:   PainSc: 0-No pain                 Jalayne Ganesh S

## 2017-04-15 ENCOUNTER — Encounter (INDEPENDENT_AMBULATORY_CARE_PROVIDER_SITE_OTHER): Payer: Self-pay

## 2017-04-15 DIAGNOSIS — Z4802 Encounter for removal of sutures: Secondary | ICD-10-CM

## 2017-04-23 ENCOUNTER — Other Ambulatory Visit: Payer: Self-pay | Admitting: Surgery

## 2017-04-23 DIAGNOSIS — Z951 Presence of aortocoronary bypass graft: Secondary | ICD-10-CM

## 2017-04-24 ENCOUNTER — Ambulatory Visit (INDEPENDENT_AMBULATORY_CARE_PROVIDER_SITE_OTHER): Payer: Self-pay | Admitting: Surgery

## 2017-04-24 ENCOUNTER — Encounter: Payer: Self-pay | Admitting: Surgery

## 2017-04-24 ENCOUNTER — Ambulatory Visit (INDEPENDENT_AMBULATORY_CARE_PROVIDER_SITE_OTHER): Payer: Medicare Other | Admitting: Physician Assistant

## 2017-04-24 ENCOUNTER — Ambulatory Visit
Admission: RE | Admit: 2017-04-24 | Discharge: 2017-04-24 | Disposition: A | Discharge status: Home / Self Care | Payer: Medicare Other | Source: Ambulatory Visit | Attending: Surgery | Admitting: Surgery

## 2017-04-24 ENCOUNTER — Encounter: Payer: Self-pay | Admitting: Physician Assistant

## 2017-04-24 ENCOUNTER — Ambulatory Visit: Payer: Medicare Other | Admitting: Physician Assistant

## 2017-04-24 VITALS — BP 97/62 | Resp 16 | Ht 67.0 in | Wt 176.0 lb

## 2017-04-24 VITALS — BP 110/63 | HR 67 | Ht 67.0 in | Wt 176.6 lb

## 2017-04-24 DIAGNOSIS — I10 Essential (primary) hypertension: Secondary | ICD-10-CM | POA: Diagnosis not present

## 2017-04-24 DIAGNOSIS — Z951 Presence of aortocoronary bypass graft: Secondary | ICD-10-CM

## 2017-04-24 DIAGNOSIS — I25810 Atherosclerosis of coronary artery bypass graft(s) without angina pectoris: Secondary | ICD-10-CM

## 2017-04-24 DIAGNOSIS — Z79899 Other long term (current) drug therapy: Secondary | ICD-10-CM

## 2017-04-24 DIAGNOSIS — E785 Hyperlipidemia, unspecified: Secondary | ICD-10-CM

## 2017-04-24 DIAGNOSIS — I2510 Atherosclerotic heart disease of native coronary artery without angina pectoris: Secondary | ICD-10-CM

## 2017-04-24 IMAGING — DX DG CHEST 2V
2 series · 2 of 2 positions shown · non-contrast
Comparison: None.

CLINICAL DATA: Prior CABG.

EXAM:
CHEST  2 VIEW

[dg chest 2 view (1 of 2)]
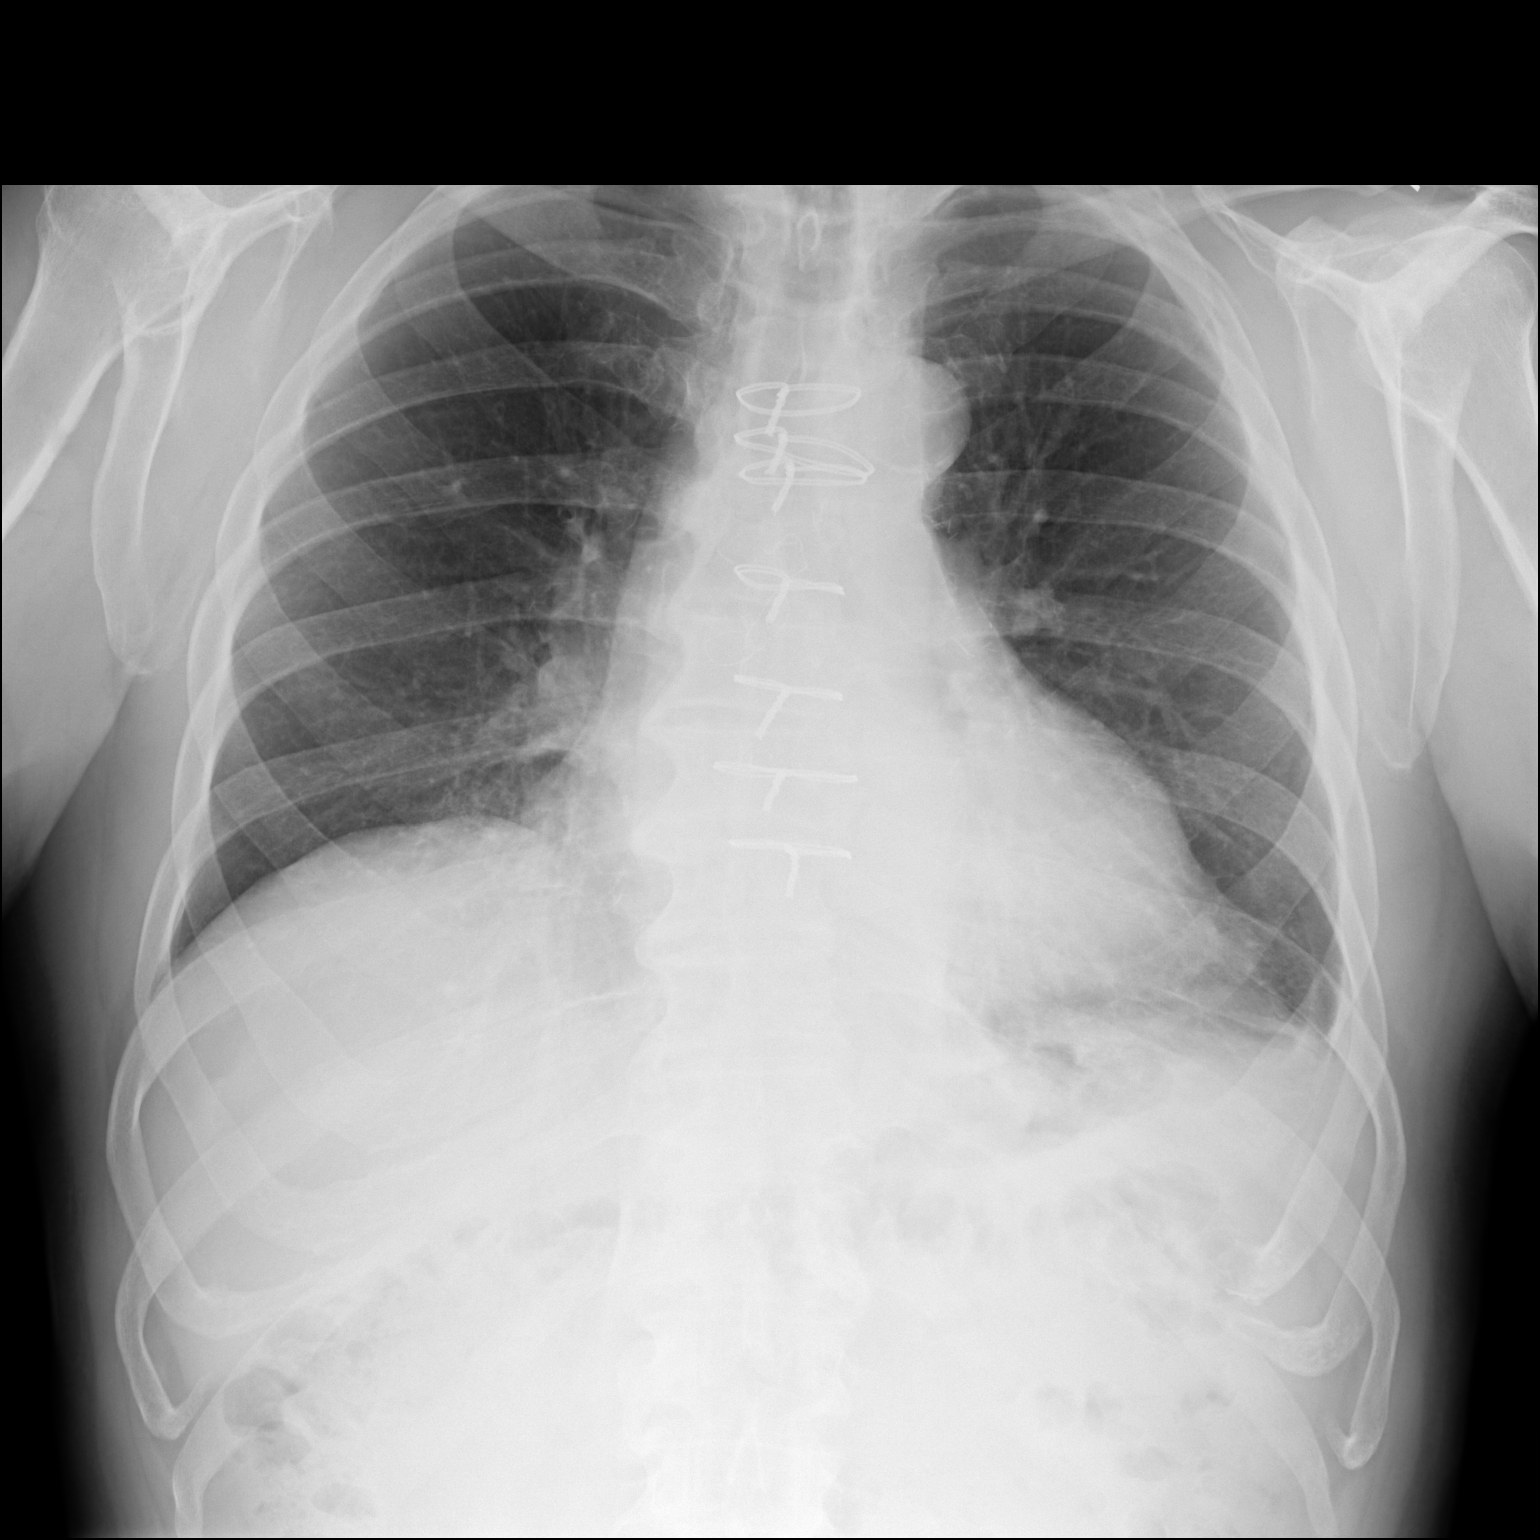

[dg chest 2 view (2 of 2)]
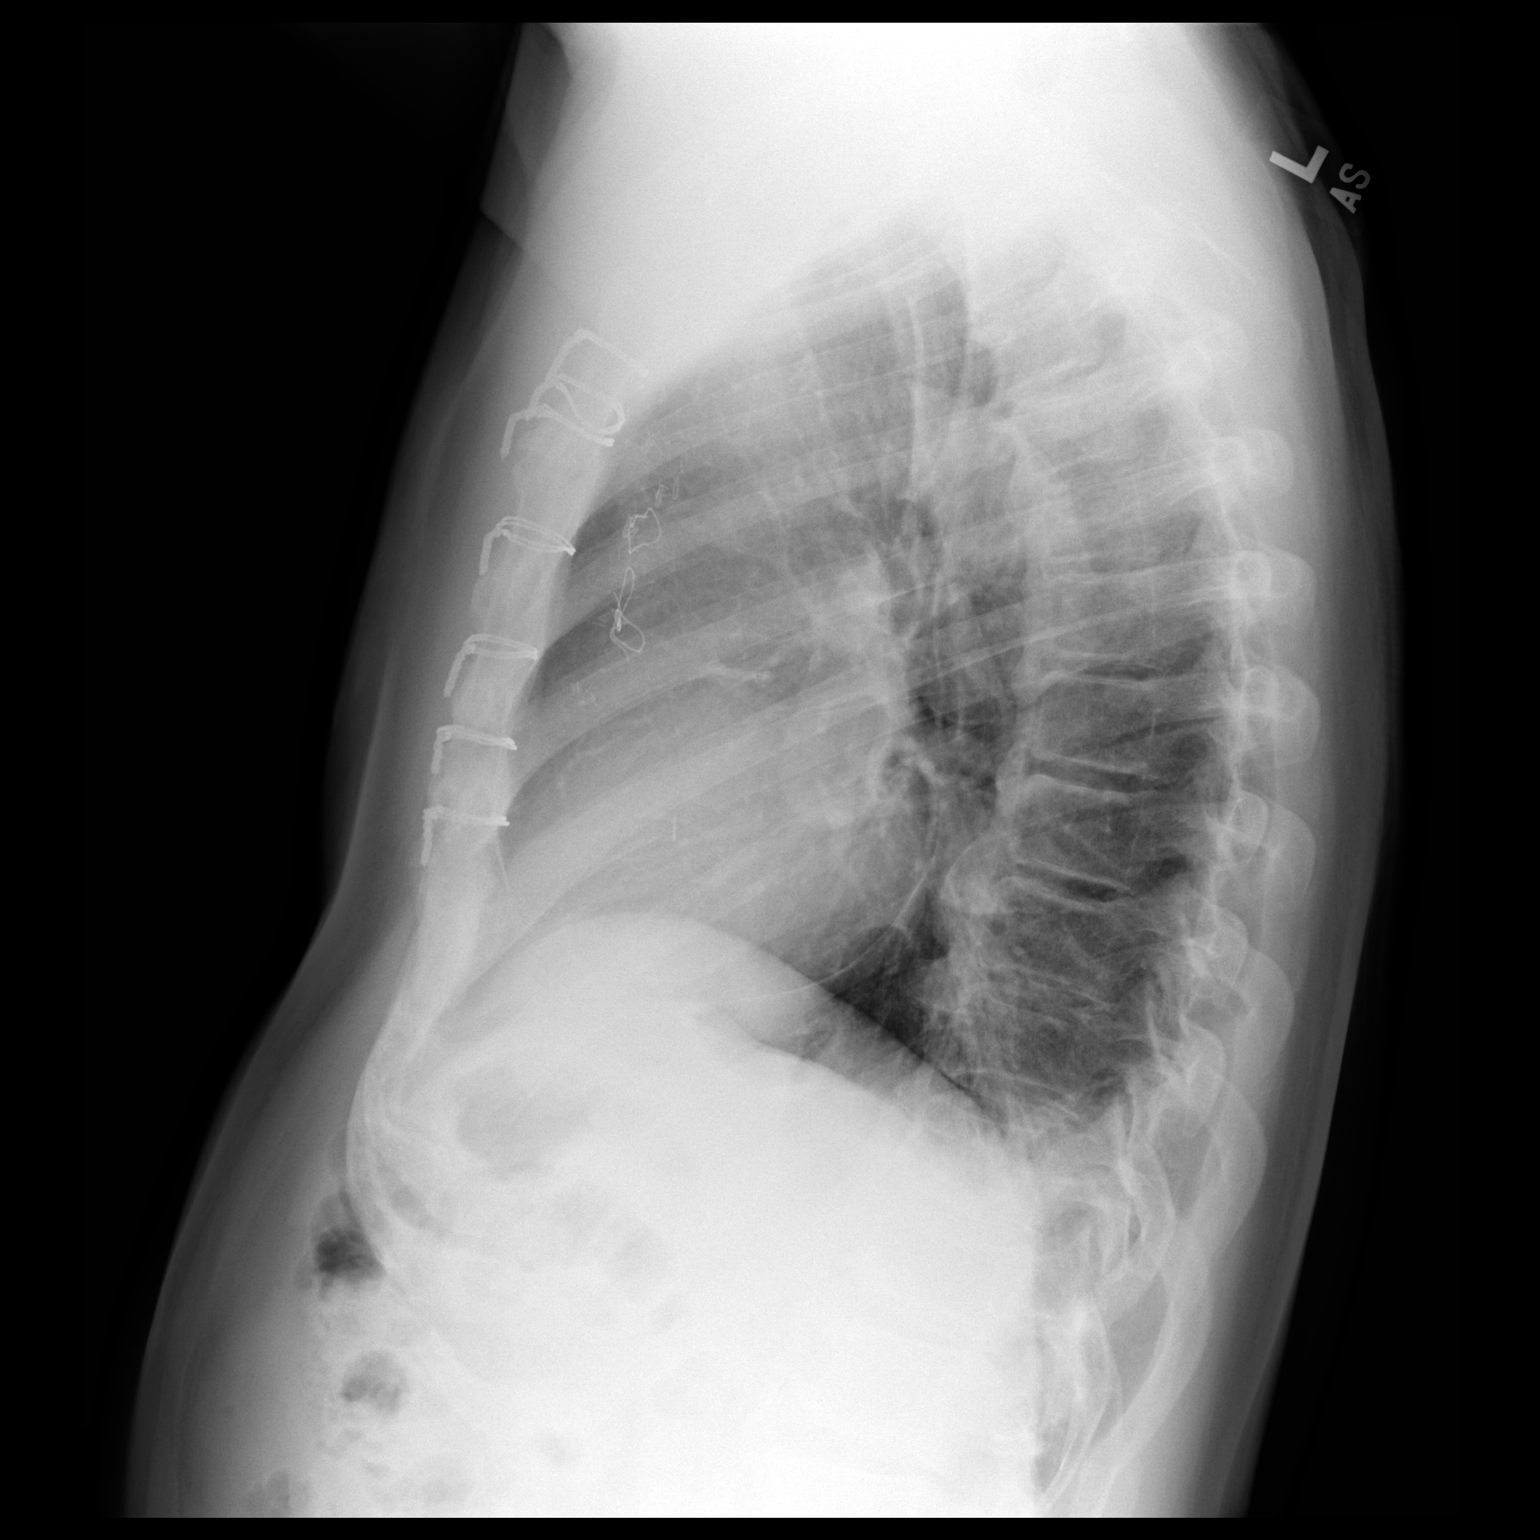

[2 of 2 positions shown; findings below may reference images not displayed]

FINDINGS: Postsurgical changes related to prior CABG. Borderline cardiomegaly.
Atherosclerotic calcification of the aortic arch. Normal pulmonary
vascularity. Increased linear densities at the left lung base likely
represent atelectasis/scarring. Blunting of the left costophrenic
angle may represent a trace pleural effusion versus pleural
scarring. No focal consolidation or pneumothorax. Bold No acute
osseous abnormality.
IMPRESSION: 1. Postsurgical changes related to prior CABG. Blunting of the left
costophrenic angle may represent trace pleural effusion versus
pleural scarring.
2. Increased linear densities in the left lung base likely
represents atelectasis and/or scarring.

## 2017-04-24 NOTE — Progress Notes (Signed)
Cardiology Office Note    Date:  04/26/2017   ID:  Jordan [name], DOB 07/10/1941, MRN [id number]  PCP:  Jordan Infante, MD  Cardiologist:  Dr. Ellyn [name]  Chief Complaint  Patient presents with  . Follow-up    seen for Dr. Ellyn [name]    History of Present Illness:  Jordan [name] is a 76 y.o. male with PMH of HTN, Mnire's disease, mild carotid disease, HLD, and recently diagnosed CAD s/p CABG. he was recently seen for dyspnea on exertion and exercise intolerance. This is most notable when walking up hills or stairs. He was evaluated with Myoview which came back intermediate risk with anterior perfusion defect suggesting ischemia, ST depression of 3 mm noted in lead II, III, aVF, V4 through V6. He eventually underwent cardiac catheterization on 03/18/2017 which showed 55% left main, 75% proximal to mid LAD, 75% mid to distal LAD lesion, outer nonobstructive mid to distal RCA disease, EF 55-65%. Given the fact patient was asymptomatic, he was discharged home for outpatient CT surgery consultation. He was seen by Dr. Cyndia [name] on 03/27/2017 and underwent CABG 4 (LIMA to LAD, SVG to D2, SVG to OM, SVG to PDA ) on 04/04/2017. Intraoperative TEE showed EF 55-60%, mild MR. Postop, he did very well. He did have some thrombocytopenia.  He presents today for cardiology office evaluation. He denies any chest discomfort with shortness of breath since discharge. He was able to walk around for 45 minutes without any exertional symptoms recently. [place] and do heavy activity. According to the patient, he was recently started on Lasix, I am unable to verify when the Lasix was first started. I will obtain basic metabolic panel. Otherwise, his dyspnea and exertion which was stented angina prior to the recent abnormal stress test since to have resolved. He can follow-up with Dr. Ellyn [name] in 3 month. Note today's EKG showed new T wave inversion V1 through V3, however given the lack of symptom,  no further workup is warranted at this time.    Past Medical History:  Diagnosis Date  . Coronary artery disease   . DDD (degenerative disc disease)   . Dyspnea   . ED (erectile dysfunction)   . GERD (gastroesophageal reflux disease)   . History of hiatal hernia   . History of kidney stones   . HTN (hypertension)   . Hyperlipidemia   . Impaired fasting glucose   . Knee pain    bilateral  . Meniere's disease   . Meniere's vertigo   . Mild carotid artery disease ([place])   . Ocular migraine   . Shoulder pain   . Wears hearing aid     Past Surgical History:  Procedure Laterality Date  . Cardiac Stress Test  2008   Normal  . CARPAL TUNNEL RELEASE Left 1998  . CARPAL TUNNEL RELEASE Right 2008  . CORONARY ARTERY BYPASS GRAFT N/A 04/04/2017   Procedure: CORONARY ARTERY BYPASS GRAFTING times  four  with left internal mammary artery and right leg saphenous vein;  Surgeon: Jordan Pollack, MD;  Location: [facility];  Service: Open Heart Surgery;  Laterality: N/A;  . HERNIA REPAIR Left    inguinal  . HIATAL HERNIA REPAIR     no surgery per pt  . KNEE CARTILAGE SURGERY Bilateral   . LEFT HEART CATH AND CORONARY ANGIOGRAPHY N/A 03/18/2017   Procedure: Left Heart Cath and Coronary Angiography;  Surgeon: Leonie Man, MD;  Location: [facility];  Service: Cardiovascular;  Laterality: N/A;  . MEDIAL PARTIAL KNEE REPLACEMENT Right 2010   Partial right knee replacement  . ROTATOR CUFF REPAIR Left 2008  . SHOULDER ARTHROSCOPY W/ ROTATOR CUFF REPAIR Right 06/15/2016  . TEE WITHOUT CARDIOVERSION N/A 04/04/2017   Procedure: TRANSESOPHAGEAL ECHOCARDIOGRAM (TEE);  Surgeon: Jordan Pollack, MD;  Location: [facility];  Service: Open Heart Surgery;  Laterality: N/A;  . TONSILLECTOMY      Current Medications: Outpatient Medications Prior to Visit  Medication Sig Dispense Refill  . aspirin 325 MG EC tablet Take 1 tablet (325 mg total) by mouth daily. 30 tablet 0  . Calcium Citrate-Vitamin D  (CITRACAL MAXIMUM) 315-250 MG-UNIT TABS Take 1 tablet by mouth 2 (two) times daily.     . cholecalciferol (VITAMIN D) 1000 UNITS tablet Take 1,000 Units by mouth 2 (two) times daily.     . Cyanocobalamin (VITAMIN B-12 PO) Take 1 tablet by mouth every evening.     . diphenhydramine-acetaminophen (TYLENOL PM) 25-500 MG TABS tablet Take 0.5 tablets by mouth at bedtime as needed (for sleep.).    Marland Kitchen ezetimibe (ZETIA) 10 MG tablet Take 10 mg by mouth every evening.     . metoprolol tartrate (LOPRESSOR) 25 MG tablet Take 0.5 tablets (12.5 mg total) by mouth 2 (two) times daily. 30 tablet 1  . Multiple Vitamins-Minerals (CENTRUM SILVER PO) Take 1 tablet by mouth daily.    . pravastatin (PRAVACHOL) 20 MG tablet Take 1 tablet (20 mg total) by mouth daily at 6 PM. 30 tablet 1  . ranitidine (ZANTAC) 150 MG tablet Take 150 mg by mouth 2 (two) times daily.    . furosemide (LASIX) 40 MG tablet Take 1 tablet (40 mg total) by mouth daily. 3 tablet 0  . Menthol, Topical Analgesic, (BIOFREEZE EX) Apply 1 application topically 3 (three) times daily as needed (for shoulder/lower back pain.).    Marland Kitchen potassium chloride SA (K-DUR,KLOR-CON) 20 MEQ tablet Take 1 tablet (20 mEq total) by mouth daily. 3 tablet 0   No facility-administered medications prior to visit.      Allergies:   Crestor [rosuvastatin calcium]   Social History   Social History  . Marital status: Married    Spouse name: N/A  . Number of children: 2  . Years of education: N/A   Occupational History  . retired    Social History Main Topics  . Smoking status: Never Smoker  . Smokeless tobacco: Never Used  . Alcohol use 9.6 oz/week    14 Glasses of wine, 2 Shots of liquor per week  . Drug use: No  . Sexual activity: Yes   Other Topics Concern  . None   Social History Narrative   Jordan [name] is a very pleasant married gentleman with 2 children Jordan [name] and Jordan [name]) each with 3 children/total 6 grandchildren. He is currently retired now from his  original job in [employer], however he still serves on [employer] at [employer] and recently stepped down from [employer] at [employer].   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    For the last several months he has been off caffeine and wine because of GI upset symptoms and palpitations. Also trying to lose weight.     He used to drink maybe 2 glasses of wine at night, and he is subsequently all quit alcohol.   He is relatively active with exercise but not with routine pattern for standard routine.  He does push AND other calisthenics as well as kayaking and bike riding.      Family History:  The patient's family history includes Cancer in his mother; Coronary artery disease in his father; Heart attack in his father; Heart disease in his father; Heart failure in his mother; Hyperlipidemia in his brother, father, and son; Hypertension in his brother.   ROS:   Please see the history of present illness.    ROS All other systems reviewed and are negative.   PHYSICAL EXAM:   VS:  BP 110/63   Pulse 67   Ht 5\' 7"  (1.702 m)   Wt 176 lb 9.6 oz (80.1 kg)   SpO2 94%   BMI 27.66 kg/m    GEN: Well nourished, well developed, in no acute distress  HEENT: normal  Neck: no JVD, carotid bruits, or masses Cardiac: RRR; no murmurs, rubs, or gallops,no edema  Respiratory:  clear to auscultation bilaterally, normal work of breathing GI: soft, nontender, nondistended, + BS MS: no deformity or atrophy  Skin: warm and dry, no rash Neuro:  Alert and Oriented x 3, Strength and sensation are intact Psych: euthymic mood, full affect  Wt Readings from Last 3 Encounters:  04/24/17 176 lb (79.8 kg)  04/24/17 176 lb 9.6 oz (80.1 kg)  04/08/17 184 lb 1.4 oz (83.5 kg)      Studies/Labs Reviewed:   EKG:  EKG is ordered today.  The ekg ordered today demonstrates Sinus rhythm with new T wave inversion in V1 through V3  Recent Labs: 04/02/2017:  ALT 16 04/05/2017: Magnesium 2.2 04/06/2017: Hemoglobin 10.7; Platelets 131 04/24/2017: BUN 19; Creatinine, Ser 0.96; Potassium 5.6; Sodium 145   Lipid Panel No results found for: CHOL, TRIG, HDL, CHOLHDL, VLDL, LDLCALC, LDLDIRECT  Additional studies/ records that were reviewed today include:   Myoview 02/12/2017 Study Highlights     Nuclear stress EF: 56%.  The left ventricular ejection [name] is normal (55-65%).  Blood pressure demonstrated a hypertensive response to exercise.  Horizontal ST segment depression ST segment depression of 3 mm was noted during stress in the II, III, aVF, V6, V5 and V4 leads.  Defect 1: There is a small defect of moderate severity.   Small size, moderate intensity reversible (SDS 4) mid to distal anterior wall perfusion defect suggestive of ischemia. Abnormal exercise electrocardiogram with 3 mm horizontal ST segment depression in II, III, AVF, V4-V6 and 2 mm ST elevation in AVR. LVEF 56% with normal wall motion. Exercise tolerance was good at 10 minutes, however, fatigue and dyspnea were reported. This is an intermediate risk test - further coronary evaluation may be warranted.    Cath 03/18/2017 Conclusion     LM lesion, 55 %stenosed.  Prox LAD to Mid LAD lesion, 75 %stenosed. Mid-Distal LAD lesion, 75 %stenosed.  Moderate, non-obstructive m-dRCA disease.  The left ventricular systolic function is normal. The left ventricular ejection [name] is 55-65% by visual estimate.  LV end diastolic pressure is normal.  The presence of the radial arterial loop made catheter exchange difficult, would not recommend radial catheterizations in the future.   Severe disease involving the left main coronary artery and proximal to mid LAD. Based on the distal left main disease and calcified LAD disease, he is better served with CABG.  Plan:  Okay to discharge home is really been relatively asymptomatic at rest  We will arrange outpatient CT surgical  consultation  Continue statin and ARB along with aspirin.   He has not been on  beta blocker - will initiate perioperative beta blocker.    CABG x 4 by Dr. Cyndia [name] 04/04/2017 .   Coronary artery bypass grafting x 4   Left internal mammary graft to the LAD  SVG to Diagonal 2  SVG to OM  SVG to PDA   TEE 04/04/2017  Left ventricle: Normal cavity size, wall thickness, left ventricular diastolic function and left atrial pressure. LV systolic function is normal with an EF of 55-60%. There are no obvious wall motion abnormalities.  Mitral valve: Mild regurgitation.  Right ventricle: Normal cavity size, wall thickness and ejection [name].   ASSESSMENT:    1. Coronary artery disease involving coronary bypass graft of native heart without angina pectoris   2. Encounter for long-term (current) use of medications   3. S/P CABG x 4   4. Essential hypertension   5. Hyperlipidemia, unspecified hyperlipidemia type      PLAN:  In order of problems listed above:  1. CAD s/p recent CABG: New T wave inversion in V1 through V3, however patient has been recovering well and denies any chest pain despite walking up to 45 minutes each day. [place] farm and stays very active. We'll defer to CT surgery when to decrease aspirin to 81 mg dose.  2. Hypertension: Blood pressure well-controlled  3. Hyperlipidemia: On Pravachol 20 mg daily    Medication Adjustments/Labs and Tests Ordered: Current medicines are reviewed at length with the patient today.  Concerns regarding medicines are outlined above.  Medication changes, Labs and Tests ordered today are listed in the Patient Instructions below. Patient Instructions  Medication Instructions:   No changes  Labwork:   BMET today  Testing/Procedures:  none  Follow-Up:  With Dr. Ellyn [name] in 3 months   If you need a refill on your cardiac medications before your next appointment, please call your pharmacy.      Hilbert Corrigan, [place]  04/26/2017 8:06 AM    [address], [place], [place]  [address] Phone: [phone]; Fax: [phone]

## 2017-04-24 NOTE — Progress Notes (Signed)
HPI: Patient returns for routine postoperative follow-up having undergone CABG x 4 on 04/04/2017. The patient's early postoperative recovery while in the hospital was notable for an uncomplicated postop course. Since hospital discharge the patient reports that he has been feeling well. He is walking a lot without chest pain or shortness of breath.   Current Outpatient Prescriptions  Medication Sig Dispense Refill  . aspirin 325 MG EC tablet Take 1 tablet (325 mg total) by mouth daily. 30 tablet 0  . Calcium Citrate-Vitamin D (CITRACAL MAXIMUM) 315-250 MG-UNIT TABS Take 1 tablet by mouth 2 (two) times daily.     . cholecalciferol (VITAMIN D) 1000 UNITS tablet Take 1,000 Units by mouth 2 (two) times daily.     . Cyanocobalamin (VITAMIN B-12 PO) Take 1 tablet by mouth every evening.     . diphenhydramine-acetaminophen (TYLENOL PM) 25-500 MG TABS tablet Take 0.5 tablets by mouth at bedtime as needed (for sleep.).    Marland Kitchen ezetimibe (ZETIA) 10 MG tablet Take 10 mg by mouth every evening.     . furosemide (LASIX) 40 MG tablet Take 40 mg by mouth daily.    . metoprolol tartrate (LOPRESSOR) 25 MG tablet Take 0.5 tablets (12.5 mg total) by mouth 2 (two) times daily. 30 tablet 1  . Multiple Vitamins-Minerals (CENTRUM SILVER PO) Take 1 tablet by mouth daily.    . potassium chloride SA (K-DUR,KLOR-CON) 20 MEQ tablet Take 20 mEq by mouth daily.    . pravastatin (PRAVACHOL) 20 MG tablet Take 1 tablet (20 mg total) by mouth daily at 6 PM. 30 tablet 1  . ranitidine (ZANTAC) 150 MG tablet Take 150 mg by mouth 2 (two) times daily.     No current facility-administered medications for this visit.     Physical Exam: BP 97/62 (BP Location: Right Arm, Patient Position: Sitting, Cuff Size: Large)   Resp 16   Ht 5\' 7"  (1.702 m)   Wt 176 lb (79.8 kg)   SpO2 95% Comment: ON RA  BMI 27.57 kg/m  He looks well. Lung exam is clear. Cardiac exam shows a regular rate and rhythm with normal heart sounds. Chest  incision is healing well and sternum is stable. The leg incisions are healing well and there is no peripheral edema.    Diagnostic Tests:  CLINICAL DATA:  Prior CABG.  EXAM: CHEST  2 VIEW  COMPARISON:  None.  FINDINGS: Postsurgical changes related to prior CABG. Borderline cardiomegaly. Atherosclerotic calcification of the aortic arch. Normal pulmonary vascularity. Increased linear densities at the left lung base likely represent atelectasis/scarring. Blunting of the left costophrenic angle may represent a trace pleural effusion versus pleural scarring. No focal consolidation or pneumothorax. Bold No acute osseous abnormality.  IMPRESSION: 1. Postsurgical changes related to prior CABG. Blunting of the left costophrenic angle may represent trace pleural effusion versus pleural scarring. 2. Increased linear densities in the left lung base likely represents atelectasis and/or scarring.   Electronically Signed   By: Titus Dubin M.D.   On: 04/24/2017 09:54  Impression:  Overall I think he is doing very  well. I is extremely motivated and will continue to be active.  He is planning to participate in cardiac rehab. I told him he could drive his car but should not lift anything heavier than 10 lbs for three months postop.   Plan:  He will continue to follow up with Dr. Ellyn Hack and Dr. Joylene Draft and will contact me if he has any problems with his incisions.  Gaye Pollack, MD [facility] [phone]

## 2017-04-24 NOTE — Patient Instructions (Signed)
Medication Instructions:   No changes  Labwork:   BMET today  Testing/Procedures:  none  Follow-Up:  With Dr. Ellyn Hack in 3 months   If you need a refill on your cardiac medications before your next appointment, please call your pharmacy.

## 2017-04-24 NOTE — Addendum Note (Signed)
Addended by: Fanny Bien R on: 04/24/2017 12:00 PM   Modules accepted: Orders

## 2017-04-25 LAB — BASIC METABOLIC PANEL
BUN/Creatinine Ratio: 20 (ref 10–24)
BUN: 19 mg/dL (ref 8–27)
CALCIUM: 9.4 mg/dL (ref 8.6–10.2)
CO2: 25 mmol/L (ref 20–29)
Chloride: 105 mmol/L (ref 96–106)
Creatinine, Ser: 0.96 mg/dL (ref 0.76–1.27)
GFR calc Af Amer: 90 mL/min/{1.73_m2} (ref >59)
GFR, EST NON AFRICAN AMERICAN: 78 mL/min/{1.73_m2} (ref >59)
Glucose: 87 mg/dL (ref 65–99)
POTASSIUM: 5.6 mmol/L — AB (ref 3.5–5.2)
Sodium: 145 mmol/L — ABNORMAL HIGH (ref 134–144)

## 2017-04-26 ENCOUNTER — Encounter: Payer: Self-pay | Admitting: Physician Assistant

## 2017-04-26 ENCOUNTER — Telehealth (HOSPITAL_COMMUNITY): Payer: Self-pay | Admitting: Pharmacy Technician

## 2017-04-26 ENCOUNTER — Telehealth: Payer: Self-pay | Admitting: *Deleted

## 2017-04-26 ENCOUNTER — Telehealth (HOSPITAL_COMMUNITY): Payer: Self-pay

## 2017-04-26 DIAGNOSIS — Z79899 Other long term (current) drug therapy: Secondary | ICD-10-CM

## 2017-04-26 MED ORDER — FUROSEMIDE 40 MG PO TABS: 40.0000 mg | ORAL_TABLET | Freq: Every day | ORAL | Status: DC | PRN

## 2017-04-26 NOTE — Telephone Encounter (Signed)
Cardiac Rehab Medication Review by a Pharmacist  Does the patient  feel that his/her medications are working for him/her?  yes  Has the patient been experiencing any side effects to the medications prescribed?  yes  Does the patient measure his/her own blood pressure or blood glucose at home?  yes   Does the patient have any problems obtaining medications due to transportation or finances?   no  Understanding of regimen: fair Understanding of indications: fair Potential of compliance: excellent    Pharmacist comments: Patient describes many medications that are new to him s/p CABG and some recent changes to his regimen including d/c of potassium and changing lasix to PRN.  After reviewing his medications he states a better understanding of his regimen and indications for use.   He denies any muscle aches/pains with pravastatin since switching from rosuvastatin.  He describes some fatigue that could be related to his new beta blocker but otherwise has no issues with his medications at this time.     Bertis Ruddy, PharmD Pharmacy Resident Pager #: [phone] 04/26/2017 4:21 PM

## 2017-04-26 NOTE — Telephone Encounter (Signed)
-----   Message from [place], [place] sent at 04/25/2017  1:54 PM EDT ----- Potassium level borderline high, stop potassium supplement. Renal function worsened slightly, decrease lasix to 40mg  as needed. Recheck in 2 weeks

## 2017-04-26 NOTE — Telephone Encounter (Signed)
I called and left message on voicemail to call office about scheduling for cardiac rehab. I left office contact information on patient voicemail to return call.  ° °

## 2017-04-26 NOTE — Telephone Encounter (Signed)
Spoke to patient, he received partial instructions, but explained that he was driving and requested I call back and leave voice mail w recommendations.  I called back and left detailed msg, recommended he call back if he needs clarification.  BMET ordered.  Med list updated.

## 2017-05-01 DIAGNOSIS — Z23 Encounter for immunization: Secondary | ICD-10-CM | POA: Diagnosis not present

## 2017-05-02 ENCOUNTER — Encounter (HOSPITAL_COMMUNITY)
Admission: RE | Admit: 2017-05-02 | Discharge: 2017-05-02 | Disposition: A | Discharge status: Home / Self Care | Payer: Medicare Other | Source: Ambulatory Visit | Attending: Cardiology | Admitting: Cardiology

## 2017-05-02 ENCOUNTER — Encounter (HOSPITAL_COMMUNITY): Payer: Self-pay

## 2017-05-02 VITALS — BP 124/60 | HR 65 | Ht 67.25 in | Wt 178.8 lb

## 2017-05-02 DIAGNOSIS — Z951 Presence of aortocoronary bypass graft: Secondary | ICD-10-CM | POA: Diagnosis not present

## 2017-05-02 NOTE — Progress Notes (Signed)
Cardiac Individual Treatment Plan  Patient Details  Name: Jordan [name] MRN: [id number] Date of Birth: 12/08/1940 Referring Provider:     CARDIAC REHAB PHASE II ORIENTATION from 05/02/2017 in [facility]  Referring Provider  Glenetta Hew MD      Initial Encounter Date:    CARDIAC REHAB PHASE II ORIENTATION from 05/02/2017 in [facility]  Date  05/02/17  Referring Provider  Glenetta Hew MD      Visit Diagnosis: S/P CABG x 4  Patient's Home Medications on Admission:  Current Outpatient Prescriptions:  .  aspirin 325 MG EC tablet, Take 1 tablet (325 mg total) by mouth daily., Disp: 30 tablet, Rfl: 0 .  Calcium Citrate-Vitamin D (CITRACAL MAXIMUM) 315-250 MG-UNIT TABS, Take 1 tablet by mouth 2 (two) times daily. , Disp: , Rfl:  .  cholecalciferol (VITAMIN D) 1000 UNITS tablet, Take 1,000 Units by mouth 2 (two) times daily. , Disp: , Rfl:  .  Cyanocobalamin (VITAMIN B-12 PO), Take 1 tablet by mouth every evening. , Disp: , Rfl:  .  diphenhydramine-acetaminophen (TYLENOL PM) 25-500 MG TABS tablet, Take 0.5 tablets by mouth at bedtime as needed (for sleep.)., Disp: , Rfl:  .  ezetimibe (ZETIA) 10 MG tablet, Take 10 mg by mouth every evening. , Disp: , Rfl:  .  furosemide (LASIX) 40 MG tablet, Take 1 tablet (40 mg total) by mouth daily as needed., Disp: , Rfl:  .  metoprolol tartrate (LOPRESSOR) 25 MG tablet, Take 0.5 tablets (12.5 mg total) by mouth 2 (two) times daily., Disp: 30 tablet, Rfl: 1 .  Multiple Vitamins-Minerals (CENTRUM SILVER PO), Take 1 tablet by mouth daily., Disp: , Rfl:  .  pravastatin (PRAVACHOL) 20 MG tablet, Take 1 tablet (20 mg total) by mouth daily at 6 PM., Disp: 30 tablet, Rfl: 1 .  ranitidine (ZANTAC) 150 MG tablet, Take 150 mg by mouth 2 (two) times daily., Disp: , Rfl:   Past Medical History: Past Medical History:  Diagnosis Date  . Coronary artery disease   . DDD (degenerative disc  disease)   . Dyspnea   . ED (erectile dysfunction)   . GERD (gastroesophageal reflux disease)   . History of hiatal hernia   . History of kidney stones   . HTN (hypertension)   . Hyperlipidemia   . Impaired fasting glucose   . Knee pain    bilateral  . Meniere's disease   . Meniere's vertigo   . Mild carotid artery disease ([place])   . Ocular migraine   . Shoulder pain   . Wears hearing aid     Tobacco Use: History  Smoking Status  . Never Smoker  Smokeless Tobacco  . Never Used    Labs: Recent Review Flowsheet Data    Labs for ITP Cardiac and Pulmonary Rehab Latest Ref Rng & Units 04/04/2017 04/04/2017 04/04/2017 04/04/2017 04/05/2017   Hemoglobin A1c 4.8 - 5.6 % - - - - -   PHART 7.350 - 7.450 7.417 7.360 - 7.355 -   PCO2ART 32.0 - 48.0 mmHg 39.3 44.9 - 42.9 -   HCO3 20.0 - 28.0 mmol/L 25.5 25.2 - 23.7 -   TCO2 0 - 100 mmol/L 27 27 24 25 22    ACIDBASEDEF 0.0 - 2.0 mmol/L - - - 2.0 -   O2SAT % 95.0 97.0 - 97.0 -      Capillary Blood Glucose: Lab Results  Component Value Date   GLUCAP 139 (H) 04/05/2017  GLUCAP 101 (H) 04/05/2017   GLUCAP 102 (H) 04/05/2017   GLUCAP 96 04/04/2017   GLUCAP 96 04/04/2017     Exercise Target Goals: Date: 05/02/17  Exercise Program Goal: Individual exercise prescription set with THRR, safety & activity barriers. Participant demonstrates ability to understand and report RPE using BORG scale, to self-measure pulse accurately, and to acknowledge the importance of the exercise prescription.  Exercise Prescription Goal: Starting with aerobic activity 30 plus minutes a day, 3 days per week for initial exercise prescription. Provide home exercise prescription and guidelines that participant acknowledges understanding prior to discharge.  Activity Barriers & Risk Stratification:     Activity Barriers & Cardiac Risk Stratification - 05/02/17 0828      Activity Barriers & Cardiac Risk Stratification   Activity Barriers Neck/Spine  Problems;Back Problems;Other (comment)   Comments R RTC surgery Oct. 2017; partial R knee replacement 6 yrs ago; B knee scopes; compressed L4-L5   Cardiac Risk Stratification High      6 Minute Walk:     6 Minute Walk    Row Name 05/02/17 1133         6 Minute Walk   Phase Initial     Distance 1838 feet     Walk Time 6 minutes     # of Rest Breaks 0     MPH 3.48     METS 3.3     RPE 11     Symptoms No     Resting HR 65 bpm     Resting BP 124/60     Resting Oxygen Saturation  95 %     Exercise Oxygen Saturation  during 6 min walk 95 %     Max Ex. HR 82 bpm     Max Ex. BP 124/60     2 Minute Post BP 118/60        Oxygen Initial Assessment:   Oxygen Re-Evaluation:   Oxygen Discharge (Final Oxygen Re-Evaluation):   Initial Exercise Prescription:     Initial Exercise Prescription - 05/02/17 1100      Date of Initial Exercise RX and Referring Provider   Date 05/02/17   Referring Provider Glenetta Hew MD     Bike   Level 0.8   Minutes 10   METs 2.89     NuStep   Level 3   SPM 80   Minutes 10   METs 2.5     Track   Laps 11   Minutes 10   METs 2.92     Prescription Details   Frequency (times per week) 3   Duration Progress to 30 minutes of continuous aerobic without signs/symptoms of physical distress     Intensity   THRR 40-80% of Max Heartrate 58-117   Ratings of Perceived Exertion 11-13   Perceived Dyspnea 0-4     Progression   Progression Continue to progress workloads to maintain intensity without signs/symptoms of physical distress.     Resistance Training   Training Prescription Yes   Weight 3lbs   Reps 10-15      Perform Capillary Blood Glucose checks as needed.  Exercise Prescription Changes:   Exercise Comments:   Exercise Goals and Review:     Exercise Goals    Row Name 05/02/17 0829             Exercise Goals   Increase Physical Activity Yes  get back to doing farm work       Intervention Provide advice,  education,  support and counseling about physical activity/exercise needs.;Develop an individualized exercise prescription for aerobic and resistive training based on initial evaluation findings, risk stratification, comorbidities and participant's personal goals.       Expected Outcomes Achievement of increased cardiorespiratory fitness and enhanced flexibility, muscular endurance and strength shown through measurements of functional capacity and personal statement of participant.       Increase Strength and Stamina Yes  get back to exercise routine of sprints and push ups       Intervention Provide advice, education, support and counseling about physical activity/exercise needs.;Develop an individualized exercise prescription for aerobic and resistive training based on initial evaluation findings, risk stratification, comorbidities and participant's personal goals.       Expected Outcomes Achievement of increased cardiorespiratory fitness and enhanced flexibility, muscular endurance and strength shown through measurements of functional capacity and personal statement of participant.       Able to understand and use rate of perceived exertion (RPE) scale Yes       Intervention Provide education and explanation on how to use RPE scale       Expected Outcomes Short Term: Able to use RPE daily in rehab to express subjective intensity level;Long Term:  Able to use RPE to guide intensity level when exercising independently       Knowledge and understanding of Target Heart Rate Range (THRR) Yes       Intervention Provide education and explanation of THRR including how the numbers were predicted and where they are located for reference       Expected Outcomes Short Term: Able to state/look up THRR;Short Term: Able to use daily as guideline for intensity in rehab;Long Term: Able to use THRR to govern intensity when exercising independently       Able to check pulse independently Yes       Intervention Provide  education and demonstration on how to check pulse in carotid and radial arteries.;Review the importance of being able to check your own pulse for safety during independent exercise       Expected Outcomes Short Term: Able to explain why pulse checking is important during independent exercise;Long Term: Able to check pulse independently and accurately       Understanding of Exercise Prescription Yes       Intervention Provide education, explanation, and written materials on patient's individual exercise prescription       Expected Outcomes Short Term: Able to explain program exercise prescription;Long Term: Able to explain home exercise prescription to exercise independently          Exercise Goals Re-Evaluation :    Discharge Exercise Prescription (Final Exercise Prescription Changes):   Nutrition:  Target Goals: Understanding of nutrition guidelines, daily intake of sodium 1500mg , cholesterol 200mg , calories 30% from fat and 7% or less from saturated fats, daily to have 5 or more servings of fruits and vegetables.  Biometrics:     Pre Biometrics - 05/02/17 1134      Pre Biometrics   Waist Circumference 38.5 inches   Hip Circumference 39 inches   Waist to Hip Ratio 0.99 %   Triceps Skinfold 14 mm   % Body Fat 26.9 %   Grip Strength 38 kg   Flexibility 121 in   Single Leg Stand 30 seconds       Nutrition Therapy Plan and Nutrition Goals:     Nutrition Therapy & Goals - 05/02/17 1217      Nutrition Therapy   Diet Therapeutic Lifestyle Changes  Personal Nutrition Goals   Nutrition Goal Wt loss of 1-2 lb/week to a wt loss goal of 6-8 lb at graduation from [facility], educate and counsel regarding individualized specific dietary modifications aiming towards targeted core components such as weight, hypertension, lipid management, diabetes, heart failure and other comorbidities.   Expected Outcomes Short Term Goal:  Understand basic principles of dietary content, such as calories, fat, sodium, cholesterol and nutrients.;Long Term Goal: Adherence to prescribed nutrition plan.      Nutrition Discharge: Nutrition Scores:     Nutrition Assessments - 05/02/17 1217      MEDFICTS Scores   Pre Score 53      Nutrition Goals Re-Evaluation:   Nutrition Goals Re-Evaluation:   Nutrition Goals Discharge (Final Nutrition Goals Re-Evaluation):   Psychosocial: Target Goals: Acknowledge presence or absence of significant depression and/or stress, maximize coping skills, provide positive support system. Participant is able to verbalize types and ability to use techniques and skills needed for reducing stress and depression.  Initial Review & Psychosocial Screening:     Initial Psych Review & Screening - 05/02/17 1412      Initial Review   Current issues with None Identified     Family Dynamics   Good Support System? Yes     Barriers   Psychosocial barriers to participate in program The patient should benefit from training in stress management and relaxation.     Screening Interventions   Interventions Encouraged to exercise      Quality of Life Scores:     Quality of Life - 05/02/17 1135      Quality of Life Scores   Health/Function Pre 26.17 %   Socioeconomic Pre 28.21 %   Psych/Spiritual Pre 25.29 %   Family Pre 22.8 %   GLOBAL Pre 25.91 %      PHQ-9: Recent Review Flowsheet Data    There is no flowsheet data to display.     Interpretation of Total Score  Total Score Depression Severity:  1-4 = Minimal depression, 5-9 = Mild depression, 10-14 = Moderate depression, 15-19 = Moderately severe depression, 20-27 = Severe depression   Psychosocial Evaluation and Intervention:   Psychosocial Re-Evaluation:   Psychosocial Discharge (Final Psychosocial Re-Evaluation):   Vocational Rehabilitation: Provide vocational rehab assistance to qualifying candidates.   Vocational  Rehab Evaluation & Intervention:   Education: Education Goals: Education classes will be provided on a weekly basis, covering required topics. Participant will state understanding/return demonstration of topics presented.  Learning Barriers/Preferences:     Learning Barriers/Preferences - 05/02/17 0827      Learning Barriers/Preferences   Learning Barriers Sight;Hearing   Learning Preferences Written Material;Video;Verbal Instruction;Skilled Demonstration;Pictoral      Education Topics: Count Your Pulse:  -Group instruction provided by verbal instruction, demonstration, patient participation and written materials to support subject.  Instructors address importance of being able to find your pulse and how to count your pulse when at home without a heart monitor.  Patients get hands on experience counting their pulse with staff help and individually.   Heart Attack, Angina, and Risk Factor Modification:  -Group instruction provided by verbal instruction, video, and written materials to support subject.  Instructors address signs and symptoms of angina and heart attacks.    Also discuss risk factors for heart disease and how to make changes to improve heart health risk factors.   Functional Fitness:  -Group instruction provided by verbal instruction, demonstration, patient participation,  and written materials to support subject.  Instructors address safety measures for doing things around the house.  Discuss how to get up and down off the floor, how to pick things up properly, how to safely get out of a chair without assistance, and balance training.   Meditation and Mindfulness:  -Group instruction provided by verbal instruction, patient participation, and written materials to support subject.  Instructor addresses importance of mindfulness and meditation practice to help reduce stress and improve awareness.  Instructor also leads participants through a meditation exercise.     Stretching for Flexibility and Mobility:  -Group instruction provided by verbal instruction, patient participation, and written materials to support subject.  Instructors lead participants through series of stretches that are designed to increase flexibility thus improving mobility.  These stretches are additional exercise for major muscle groups that are typically performed during regular warm up and cool down.   Hands Only CPR:  -Group verbal, video, and participation provides a basic overview of AHA guidelines for community CPR. Role-play of emergencies allow participants the opportunity to practice calling for help and chest compression technique with discussion of AED use.   Hypertension: -Group verbal and written instruction that provides a basic overview of hypertension including the most recent diagnostic guidelines, risk factor reduction with self-care instructions and medication management.    Nutrition I class: Heart Healthy Eating:  -Group instruction provided by PowerPoint slides, verbal discussion, and written materials to support subject matter. The instructor gives an explanation and review of the Therapeutic Lifestyle Changes diet recommendations, which includes a discussion on lipid goals, dietary fat, sodium, fiber, plant stanol/sterol esters, sugar, and the components of a well-balanced, healthy diet.   Nutrition II class: Lifestyle Skills:  -Group instruction provided by PowerPoint slides, verbal discussion, and written materials to support subject matter. The instructor gives an explanation and review of label reading, grocery shopping for heart health, heart healthy recipe modifications, and ways to make healthier choices when eating out.   Diabetes Question & Answer:  -Group instruction provided by PowerPoint slides, verbal discussion, and written materials to support subject matter. The instructor gives an explanation and review of diabetes co-morbidities, pre-  and post-prandial blood glucose goals, pre-exercise blood glucose goals, signs, symptoms, and treatment of hypoglycemia and hyperglycemia, and foot care basics.   Diabetes Blitz:  -Group instruction provided by PowerPoint slides, verbal discussion, and written materials to support subject matter. The instructor gives an explanation and review of the physiology behind type 1 and type 2 diabetes, diabetes medications and rational behind using different medications, pre- and post-prandial blood glucose recommendations and Hemoglobin A1c goals, diabetes diet, and exercise including blood glucose guidelines for exercising safely.    Portion Distortion:  -Group instruction provided by PowerPoint slides, verbal discussion, written materials, and food models to support subject matter. The instructor gives an explanation of serving size versus portion size, changes in portions sizes over the last 20 years, and what consists of a serving from each food group.   Stress Management:  -Group instruction provided by verbal instruction, video, and written materials to support subject matter.  Instructors review role of stress in heart disease and how to cope with stress positively.     Exercising on Your Own:  -Group instruction provided by verbal instruction, power point, and written materials to support subject.  Instructors discuss benefits of exercise, components of exercise, frequency and intensity of exercise, and end points for exercise.  Also discuss use of nitroglycerin and activating EMS.  Review options of places to exercise outside of rehab.  Review guidelines for sex with heart disease.   Cardiac Drugs I:  -Group instruction provided by verbal instruction and written materials to support subject.  Instructor reviews cardiac drug classes: antiplatelets, anticoagulants, beta blockers, and statins.  Instructor discusses reasons, side effects, and lifestyle considerations for each drug  class.   Cardiac Drugs II:  -Group instruction provided by verbal instruction and written materials to support subject.  Instructor reviews cardiac drug classes: angiotensin converting enzyme inhibitors (ACE-I), angiotensin II receptor blockers (ARBs), nitrates, and calcium channel blockers.  Instructor discusses reasons, side effects, and lifestyle considerations for each drug class.   Anatomy and Physiology of the Circulatory System:  Group verbal and written instruction and models provide basic cardiac anatomy and physiology, with the coronary electrical and arterial systems. Review of: AMI, Angina, Valve disease, Heart Failure, Peripheral Artery Disease, Cardiac Arrhythmia, Pacemakers, and the ICD.   Other Education:  -Group or individual verbal, written, or video instructions that support the educational goals of the cardiac rehab program.   Knowledge Questionnaire Score:     Knowledge Questionnaire Score - 05/02/17 1133      Knowledge Questionnaire Score   Pre Score 20/24      Core Components/Risk Factors/Patient Goals at Admission:     Personal Goals and Risk Factors at Admission - 05/02/17 0831      Core Components/Risk Factors/Patient Goals on Admission    Weight Management Yes;Weight Maintenance;Weight Loss   Intervention Weight Management: Develop a combined nutrition and exercise program designed to reach desired caloric intake, while maintaining appropriate intake of nutrient and fiber, sodium and fats, and appropriate energy expenditure required for the weight goal.;Weight Management: Provide education and appropriate resources to help participant work on and attain dietary goals.;Weight Management/Obesity: Establish reasonable short term and long term weight goals.   Admit Weight 178 lb 12.7 oz (81.1 kg)   Goal Weight: Short Term 175 lb (79.4 kg)   Goal Weight: Long Term 173 lb (78.5 kg)   Expected Outcomes Short Term: Continue to assess and modify interventions  until short term weight is achieved;Long Term: Adherence to nutrition and physical activity/exercise program aimed toward attainment of established weight goal;Weight Maintenance: Understanding of the daily nutrition guidelines, which includes 25-35% calories from fat, 7% or less cal from saturated fats, less than 200mg  cholesterol, less than 1.5gm of sodium, & 5 or more servings of fruits and vegetables daily;Weight Loss: Understanding of general recommendations for a balanced deficit meal plan, which promotes 1-2 lb weight loss per week and includes a negative energy balance of [phone] kcal/d;Understanding recommendations for meals to include 15-35% energy as protein, 25-35% energy from fat, 35-60% energy from carbohydrates, less than 200mg  of dietary cholesterol, 20-35 gm of total fiber daily;Understanding of distribution of calorie intake throughout the day with the consumption of 4-5 meals/snacks   Hypertension Yes   Intervention Provide education on lifestyle modifcations including regular physical activity/exercise, weight management, moderate sodium restriction and increased consumption of fresh fruit, vegetables, and low fat dairy, alcohol moderation, and smoking cessation.;Monitor prescription use compliance.   Expected Outcomes Short Term: Continued assessment and intervention until BP is < 140/54mm HG in hypertensive participants. < 130/33mm HG in hypertensive participants with diabetes, heart failure or chronic kidney disease.;Long Term: Maintenance of blood pressure at goal levels.   Lipids Yes   Intervention Provide education and support for participant on nutrition & aerobic/resistive exercise along with prescribed medications to achieve LDL 70mg , HDL >  40mg .   Expected Outcomes Short Term: Participant states understanding of desired cholesterol values and is compliant with medications prescribed. Participant is following exercise prescription and nutrition guidelines.;Long Term: Cholesterol  controlled with medications as prescribed, with individualized exercise RX and with personalized nutrition plan. Value goals: LDL < 70mg , HDL > 40 mg.      Core Components/Risk Factors/Patient Goals Review:    Core Components/Risk Factors/Patient Goals at Discharge (Final Review):    ITP Comments:     ITP Comments    Row Name 05/02/17 0825           ITP Comments Medical Director, Dr. Fransico Him          Comments:  Patient attended orientation from [place] to 1000 to review rules and guidelines for program. Completed 6 minute walk test, Intitial ITP, and exercise prescription.  VSS. Telemetry-SR with no noted ectopy.  Pt asymptomatic with no complaints. Brief Psychosocial Assessment reveal no barriers to participating in cardiac rehab. Pt is looking forward to his cardiac rehab. Cherre Huger, BSN Cardiac and Pulmonary Rehab Nurse Navigator ]

## 2017-05-02 NOTE — Progress Notes (Signed)
Jordan [name] 76 y.o. male DOB: 02/23/1941 MRN: [id number]      Nutrition Note  1. S/P CABG x 4    Past Medical History:  Diagnosis Date  . Coronary artery disease   . DDD (degenerative disc disease)   . Dyspnea   . ED (erectile dysfunction)   . GERD (gastroesophageal reflux disease)   . History of hiatal hernia   . History of kidney stones   . HTN (hypertension)   . Hyperlipidemia   . Impaired fasting glucose   . Knee pain    bilateral  . Meniere's disease   . Meniere's vertigo   . Mild carotid artery disease ([place])   . Ocular migraine   . Shoulder pain   . Wears hearing aid    Meds reviewed.   HT: Ht Readings from Last 1 Encounters:  05/02/17 5' 7.25" (1.708 m)    WT: Wt Readings from Last 3 Encounters:  05/02/17 178 lb 12.7 oz (81.1 kg)  04/24/17 176 lb (79.8 kg)  04/24/17 176 lb 9.6 oz (80.1 kg)     BMI 27.8   Current tobacco use? No  Labs:  Lipid Panel  No results found for: CHOL, TRIG, HDL, CHOLHDL, VLDL, LDLCALC, LDLDIRECT  Lab Results  Component Value Date   HGBA1C 5.7 (H) 04/02/2017   CBG (last 3)  No results for input(s): GLUCAP in the last 72 hours.  Nutrition Note Spoke with pt. Nutrition plan and goals reviewed with pt. Pt is following Step 1 of the Therapeutic Lifestyle Changes diet. Pt wants to lose wt. Pt has not been actively trying to lose wt at this time. Wt loss tips reviewed. Barriers to diet changes include pt lives in an independent living facility and pt typically eats "a lot of casseroles from different restaurants." Pt has used a Facilities manager to help with meal preparation and wants to rethink hiring a personal chef to help with pt's dietary needs. Pt expressed understanding of the information reviewed. Pt aware of nutrition education classes offered and plans on attending nutrition classes.  Nutrition Diagnosis ? Food-and nutrition-related knowledge deficit related to lack of exposure to information as related to diagnosis of: ?  CVD ? Pre-DM ? Overweight related to excessive energy intake as evidenced by a BMI of 27.8  Nutrition Intervention ? Pt's individual nutrition plan and goals reviewed with pt.  Nutrition Goal(s):  ? Pt to identify food quantities necessary to achieve weight loss of 6-8 lb at graduation from cardiac rehab. Goal wt of 170 lb desired.   Plan:  Pt to attend nutrition classes ? Nutrition I ? Nutrition II ? Portion Distortion  Will provide client-centered nutrition education as part of interdisciplinary care.   Monitor and evaluate progress toward nutrition goal with team.  Derek Mound, M.Ed, RD, LDN, CDE 05/02/2017 10:34 AM

## 2017-05-06 ENCOUNTER — Encounter (HOSPITAL_COMMUNITY)
Admission: RE | Admit: 2017-05-06 | Discharge: 2017-05-06 | Disposition: A | Discharge status: Home / Self Care | Payer: Medicare Other | Source: Ambulatory Visit | Attending: Cardiology | Admitting: Cardiology

## 2017-05-06 ENCOUNTER — Encounter (HOSPITAL_COMMUNITY): Payer: Self-pay

## 2017-05-06 DIAGNOSIS — Z951 Presence of aortocoronary bypass graft: Secondary | ICD-10-CM | POA: Diagnosis not present

## 2017-05-06 NOTE — Progress Notes (Signed)
Daily Session Note  Patient Details  Name: Jordan [name] MRN: [id number] Date of Birth: [birth year] Referring Provider:     [facility] from 05/02/2017 in [place]  Referring Provider  Glenetta Hew MD      Encounter Date: 05/06/2017  Check In:     Session Check In - 05/06/17 0645      Check-In   Location [facility]   Staff Present Trish Fountain, RN, BSN;Ramon Dredge, RN, MHA;Molly diVincenzo, MS, ACSM RCEP, Exercise Physiologist;Jacarri Gesner, RN, BSN   Supervising physician immediately available to respond to emergencies [facility] immediately available   Physician(s) Dr. Carolin Sicks   Medication changes reported     No   Fall or balance concerns reported    No   Tobacco Cessation No Change   Warm-up and Cool-down Performed as group-led instruction   Resistance Training Performed Yes   VAD Patient? No     Pain Assessment   Currently in Pain? No/denies      Capillary Blood Glucose: No results found for this or any previous visit (from the past 24 hour(s)).    History  Smoking Status  . Never Smoker  Smokeless Tobacco  . Never Used    Goals Met:  Exercise tolerated well  Goals Unmet:  Not Applicable  Comments: Pt started cardiac rehab today.  Pt tolerated light exercise without difficulty. VSS, telemetry-sinus rhythm, asymptomatic.  Medication list reconciled. Pt denies barriers to medicaiton compliance.  PSYCHOSOCIAL ASSESSMENT:  PHQ-0. Pt exhibits positive coping skills, hopeful outlook with supportive family. No psychosocial needs identified at this time, no psychosocial interventions necessary.    Pt enjoys farming and golfing.  Pt goals for cardiac rehab are be able to work without fatigue, maintain weight, and eventually resume exercise routine of pushups and sprints.    Pt oriented to exercise equipment and routine.    Understanding verbalized.   Dr. Fransico Him is  Medical Director for Cardiac Rehab at [facility].

## 2017-05-08 ENCOUNTER — Encounter (HOSPITAL_COMMUNITY)
Admission: RE | Admit: 2017-05-08 | Discharge: 2017-05-08 | Disposition: A | Discharge status: Home / Self Care | Payer: Medicare Other | Source: Ambulatory Visit | Attending: Cardiology | Admitting: Cardiology

## 2017-05-08 DIAGNOSIS — Z951 Presence of aortocoronary bypass graft: Secondary | ICD-10-CM | POA: Diagnosis not present

## 2017-05-10 ENCOUNTER — Encounter (HOSPITAL_COMMUNITY)
Admission: RE | Admit: 2017-05-10 | Discharge: 2017-05-10 | Disposition: A | Discharge status: Home / Self Care | Payer: Medicare Other | Source: Ambulatory Visit | Attending: Cardiology | Admitting: Cardiology

## 2017-05-10 DIAGNOSIS — Z951 Presence of aortocoronary bypass graft: Secondary | ICD-10-CM | POA: Diagnosis not present

## 2017-05-13 ENCOUNTER — Encounter (HOSPITAL_COMMUNITY)
Admission: RE | Admit: 2017-05-13 | Discharge: 2017-05-13 | Disposition: A | Discharge status: Home / Self Care | Payer: Medicare Other | Source: Ambulatory Visit | Attending: Cardiology | Admitting: Cardiology

## 2017-05-13 DIAGNOSIS — Z951 Presence of aortocoronary bypass graft: Secondary | ICD-10-CM

## 2017-05-15 ENCOUNTER — Encounter (HOSPITAL_COMMUNITY)
Admission: RE | Admit: 2017-05-15 | Discharge: 2017-05-15 | Disposition: A | Discharge status: Home / Self Care | Payer: Medicare Other | Source: Ambulatory Visit | Attending: Cardiology | Admitting: Cardiology

## 2017-05-15 ENCOUNTER — Ambulatory Visit: Payer: Medicare Other | Admitting: Surgery

## 2017-05-15 DIAGNOSIS — Z951 Presence of aortocoronary bypass graft: Secondary | ICD-10-CM

## 2017-05-17 ENCOUNTER — Encounter (HOSPITAL_COMMUNITY)
Admission: RE | Admit: 2017-05-17 | Discharge: 2017-05-17 | Disposition: A | Discharge status: Home / Self Care | Payer: Medicare Other | Source: Ambulatory Visit | Attending: Cardiology | Admitting: Cardiology

## 2017-05-17 DIAGNOSIS — Z951 Presence of aortocoronary bypass graft: Secondary | ICD-10-CM | POA: Diagnosis not present

## 2017-05-17 DIAGNOSIS — Z23 Encounter for immunization: Secondary | ICD-10-CM | POA: Diagnosis not present

## 2017-05-20 ENCOUNTER — Encounter (HOSPITAL_COMMUNITY)
Admission: RE | Admit: 2017-05-20 | Discharge: 2017-05-20 | Disposition: A | Discharge status: Home / Self Care | Payer: Medicare Other | Source: Ambulatory Visit | Attending: Cardiology | Admitting: Cardiology

## 2017-05-20 DIAGNOSIS — Z951 Presence of aortocoronary bypass graft: Secondary | ICD-10-CM | POA: Diagnosis not present

## 2017-05-22 ENCOUNTER — Encounter (HOSPITAL_COMMUNITY)
Admission: RE | Admit: 2017-05-22 | Discharge: 2017-05-22 | Disposition: A | Discharge status: Home / Self Care | Payer: Medicare Other | Source: Ambulatory Visit | Attending: Cardiology | Admitting: Cardiology

## 2017-05-22 DIAGNOSIS — Z951 Presence of aortocoronary bypass graft: Secondary | ICD-10-CM

## 2017-05-22 NOTE — Progress Notes (Signed)
Reviewed home exercise guidelines with patient including endpoints, temperature precautions, target heart rate and rate of perceived exertion. Pt is walking 54 minutes, 3 days/week as his mode of home exercise. Pt also does stretches and yoga at home. Pt voices understanding of instructions given. Sol Passer, MS, ACSM CEP

## 2017-05-24 ENCOUNTER — Encounter (HOSPITAL_COMMUNITY)
Admission: RE | Admit: 2017-05-24 | Discharge: 2017-05-24 | Disposition: A | Discharge status: Home / Self Care | Payer: Medicare Other | Source: Ambulatory Visit | Attending: Cardiology | Admitting: Cardiology

## 2017-05-24 DIAGNOSIS — Z951 Presence of aortocoronary bypass graft: Secondary | ICD-10-CM

## 2017-05-27 ENCOUNTER — Encounter (HOSPITAL_COMMUNITY)
Admission: RE | Admit: 2017-05-27 | Discharge: 2017-05-27 | Disposition: A | Discharge status: Home / Self Care | Payer: Medicare Other | Source: Ambulatory Visit | Attending: Cardiology | Admitting: Cardiology

## 2017-05-27 DIAGNOSIS — Z951 Presence of aortocoronary bypass graft: Secondary | ICD-10-CM | POA: Diagnosis not present

## 2017-05-28 ENCOUNTER — Encounter (HOSPITAL_COMMUNITY): Payer: Self-pay

## 2017-05-28 NOTE — Progress Notes (Signed)
Cardiac Individual Treatment Plan  Patient Details  Name: Jordan [name] MRN: [id number] Date of Birth: 1941/08/15 Referring Provider:     CARDIAC REHAB PHASE II ORIENTATION from 05/02/2017 in [place]  Referring Provider  Glenetta Hew MD      Initial Encounter Date:    CARDIAC REHAB PHASE II ORIENTATION from 05/02/2017 in [facility]  Date  05/02/17  Referring Provider  Glenetta Hew MD      Visit Diagnosis: S/P CABG x 4  Patient's Home Medications on Admission:  Current Outpatient Prescriptions:  .  aspirin 325 MG EC tablet, Take 1 tablet (325 mg total) by mouth daily., Disp: 30 tablet, Rfl: 0 .  Calcium Citrate-Vitamin D (CITRACAL MAXIMUM) 315-250 MG-UNIT TABS, Take 1 tablet by mouth 2 (two) times daily. , Disp: , Rfl:  .  cholecalciferol (VITAMIN D) 1000 UNITS tablet, Take 1,000 Units by mouth 2 (two) times daily. , Disp: , Rfl:  .  Cyanocobalamin (VITAMIN B-12 PO), Take 1 tablet by mouth every evening. , Disp: , Rfl:  .  diphenhydramine-acetaminophen (TYLENOL PM) 25-500 MG TABS tablet, Take 0.5 tablets by mouth at bedtime as needed (for sleep.)., Disp: , Rfl:  .  ezetimibe (ZETIA) 10 MG tablet, Take 10 mg by mouth every evening. , Disp: , Rfl:  .  furosemide (LASIX) 40 MG tablet, Take 1 tablet (40 mg total) by mouth daily as needed., Disp: , Rfl:  .  metoprolol tartrate (LOPRESSOR) 25 MG tablet, Take 0.5 tablets (12.5 mg total) by mouth 2 (two) times daily., Disp: 30 tablet, Rfl: 1 .  Multiple Vitamins-Minerals (CENTRUM SILVER PO), Take 1 tablet by mouth daily., Disp: , Rfl:  .  pravastatin (PRAVACHOL) 20 MG tablet, Take 1 tablet (20 mg total) by mouth daily at 6 PM., Disp: 30 tablet, Rfl: 1 .  ranitidine (ZANTAC) 150 MG tablet, Take 150 mg by mouth 2 (two) times daily., Disp: , Rfl:   Past Medical History: Past Medical History:  Diagnosis Date  . Coronary artery disease   . DDD (degenerative disc  disease)   . Dyspnea   . ED (erectile dysfunction)   . GERD (gastroesophageal reflux disease)   . History of hiatal hernia   . History of kidney stones   . HTN (hypertension)   . Hyperlipidemia   . Impaired fasting glucose   . Knee pain    bilateral  . Meniere's disease   . Meniere's vertigo   . Mild carotid artery disease ([place])   . Ocular migraine   . Shoulder pain   . Wears hearing aid     Tobacco Use: History  Smoking Status  . Never Smoker  Smokeless Tobacco  . Never Used    Labs: Recent Review Flowsheet Data    Labs for ITP Cardiac and Pulmonary Rehab Latest Ref Rng & Units 04/04/2017 04/04/2017 04/04/2017 04/04/2017 04/05/2017   Hemoglobin A1c 4.8 - 5.6 % - - - - -   PHART 7.350 - 7.450 7.417 7.360 - 7.355 -   PCO2ART 32.0 - 48.0 mmHg 39.3 44.9 - 42.9 -   HCO3 20.0 - 28.0 mmol/L 25.5 25.2 - 23.7 -   TCO2 0 - 100 mmol/L 27 27 24 25 22    ACIDBASEDEF 0.0 - 2.0 mmol/L - - - 2.0 -   O2SAT % 95.0 97.0 - 97.0 -      Capillary Blood Glucose: Lab Results  Component Value Date   GLUCAP 139 (H) 04/05/2017  GLUCAP 101 (H) 04/05/2017   GLUCAP 102 (H) 04/05/2017   GLUCAP 96 04/04/2017   GLUCAP 96 04/04/2017     Exercise Target Goals:    Exercise Program Goal: Individual exercise prescription set with THRR, safety & activity barriers. Participant demonstrates ability to understand and report RPE using BORG scale, to self-measure pulse accurately, and to acknowledge the importance of the exercise prescription.  Exercise Prescription Goal: Starting with aerobic activity 30 plus minutes a day, 3 days per week for initial exercise prescription. Provide home exercise prescription and guidelines that participant acknowledges understanding prior to discharge.  Activity Barriers & Risk Stratification:     Activity Barriers & Cardiac Risk Stratification - 05/02/17 0828      Activity Barriers & Cardiac Risk Stratification   Activity Barriers Neck/Spine Problems;Back  Problems;Other (comment)   Comments R RTC surgery Oct. 2017; partial R knee replacement 6 yrs ago; B knee scopes; compressed L4-L5   Cardiac Risk Stratification High      6 Minute Walk:     6 Minute Walk    Row Name 05/02/17 1133         6 Minute Walk   Phase Initial     Distance 1838 feet     Walk Time 6 minutes     # of Rest Breaks 0     MPH 3.48     METS 3.3     RPE 11     Symptoms No     Resting HR 65 bpm     Resting BP 124/60     Resting Oxygen Saturation  95 %     Exercise Oxygen Saturation  during 6 min walk 95 %     Max Ex. HR 82 bpm     Max Ex. BP 124/60     2 Minute Post BP 118/60        Oxygen Initial Assessment:   Oxygen Re-Evaluation:   Oxygen Discharge (Final Oxygen Re-Evaluation):   Initial Exercise Prescription:     Initial Exercise Prescription - 05/02/17 1100      Date of Initial Exercise RX and Referring Provider   Date 05/02/17   Referring Provider Glenetta Hew MD     Bike   Level 0.8   Minutes 10   METs 2.89     NuStep   Level 3   SPM 80   Minutes 10   METs 2.5     Track   Laps 11   Minutes 10   METs 2.92     Prescription Details   Frequency (times per week) 3   Duration Progress to 30 minutes of continuous aerobic without signs/symptoms of physical distress     Intensity   THRR 40-80% of Max Heartrate 58-117   Ratings of Perceived Exertion 11-13   Perceived Dyspnea 0-4     Progression   Progression Continue to progress workloads to maintain intensity without signs/symptoms of physical distress.     Resistance Training   Training Prescription Yes   Weight 3lbs   Reps 10-15      Perform Capillary Blood Glucose checks as needed.  Exercise Prescription Changes:     Exercise Prescription Changes    Row Name 05/07/17 1600 05/20/17 1600 05/22/17 1100         Response to Exercise   Blood Pressure (Admit) 121/64 120/70 120/80     Blood Pressure (Exercise) 120/70 138/82 120/64     Blood Pressure (Exit)  120/64 104/60 120/70  Heart Rate (Admit) 66 bpm 57 bpm 60 bpm     Heart Rate (Exercise) 81 bpm 82 bpm 89 bpm     Heart Rate (Exit) 68 bpm 57 bpm 60 bpm     Rating of Perceived Exertion (Exercise) 12 15 12      Symptoms none none none     Duration Continue with 30 min of aerobic exercise without signs/symptoms of physical distress. Continue with 30 min of aerobic exercise without signs/symptoms of physical distress. Continue with 30 min of aerobic exercise without signs/symptoms of physical distress.     Intensity THRR unchanged THRR unchanged THRR unchanged       Progression   Progression Continue to progress workloads to maintain intensity without signs/symptoms of physical distress. Continue to progress workloads to maintain intensity without signs/symptoms of physical distress. Continue to progress workloads to maintain intensity without signs/symptoms of physical distress.     Average METs 3.3 3.8 4.2       Resistance Training   Training Prescription Yes Yes Yes     Weight 3lbs 3lbs 3lbs     Reps 10-15 10-15 10-15     Time  - 10 Minutes 10 Minutes       Interval Training   Interval Training No No No       Bike   Level 0.8 1 1.5     Minutes 10 10 10      METs 2.86 3.32 4.49       NuStep   Level 3 5 5      SPM 90 90 90     Minutes 10 10 10      METs 3.9 5.1 4.6       Track   Laps 13 11 15      Minutes 10 10 10      METs 3.26 2.91 3.6       Home Exercise Plan   Plans to continue exercise at  -  - Home (comment)     Frequency  -  - Add 3 additional days to program exercise sessions.     Initial Home Exercises Provided  -  - 05/22/17        Exercise Comments:     Exercise Comments    Row Name 05/08/17 0915 05/22/17 0725         Exercise Comments Reviewed METs with patient. Reviewed METs, goals and home exercise prescription with patient.         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       Expected Outcomes Achievement of increased cardiorespiratory fitness and enhanced flexibility, muscular endurance and strength shown through measurements of functional capacity and personal statement of participant.       Increase Strength and Stamina Yes  get back to exercise routine of sprints and push ups       Intervention Provide advice, education, support and counseling about physical activity/exercise needs.;Develop an individualized exercise prescription for aerobic and resistive training based on initial evaluation findings, risk stratification, comorbidities and participant's personal goals.  Expected Outcomes Achievement of increased cardiorespiratory fitness and enhanced flexibility, muscular endurance and strength shown through measurements of functional capacity and personal statement of participant.       Able to understand and use rate of perceived exertion (RPE) scale Yes       Intervention Provide education and explanation on how to use RPE scale       Expected Outcomes Short Term: Able to use RPE daily in rehab to express subjective intensity level;Long Term:  Able to use RPE to guide intensity level when exercising independently       Knowledge and understanding of Target Heart Rate Range (THRR) Yes       Intervention Provide education and explanation of THRR including how the numbers were predicted and where they are located for reference       Expected Outcomes Short Term: Able to state/look up THRR;Short Term: Able to use daily as guideline for intensity in rehab;Long Term: Able to use THRR to govern intensity when exercising  independently       Able to check pulse independently Yes       Intervention Provide education and demonstration on how to check pulse in carotid and radial arteries.;Review the importance of being able to check your own pulse for safety during independent exercise       Expected Outcomes Short Term: Able to explain why pulse checking is important during independent exercise;Long Term: Able to check pulse independently and accurately       Understanding of Exercise Prescription Yes       Intervention Provide education, explanation, and written materials on patient's individual exercise prescription       Expected Outcomes Short Term: Able to explain program exercise prescription;Long Term: Able to explain home exercise prescription to exercise independently          Exercise Goals Re-Evaluation :     Exercise Goals Re-Evaluation    Row Name 05/22/17 1123             Exercise Goal Re-Evaluation   Exercise Goals Review Increase Physical Activity;Understanding of Exercise Prescription;Able to understand and use rate of perceived exertion (RPE) scale;Knowledge and understanding of Target Heart Rate Range (THRR)       Comments Reviewed home exercise prescription with patient including THRR, RPE scale, and endpoints for exercise. 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       Expected Outcomes Continue walking in addition to exercise at cardiac rehab to build strength and stamina to do ADLs.           Discharge Exercise Prescription (Final Exercise Prescription Changes):     Exercise Prescription Changes - 05/22/17 1100      Response to Exercise   Blood Pressure (Admit) 120/80   Blood Pressure (Exercise) 120/64   Blood Pressure (Exit) 120/70   Heart Rate (Admit) 60 bpm   Heart Rate (Exercise) 89 bpm   Heart Rate (Exit) 60 bpm   Rating of Perceived Exertion (Exercise) 12   Symptoms none   Duration Continue with 30 min of aerobic  exercise without signs/symptoms of physical distress.   Intensity THRR unchanged     Progression   Progression Continue to progress workloads to maintain intensity without signs/symptoms of physical distress.   Average METs 4.2     Resistance Training   Training Prescription Yes   Weight 3lbs   Reps 10-15   Time 10 Minutes  Interval Training   Interval Training No     Bike   Level 1.5   Minutes 10   METs 4.49     NuStep   Level 5   SPM 90   Minutes 10   METs 4.6     Track   Laps 15   Minutes 10   METs 3.6     Home Exercise Plan   Plans to continue exercise at Home (comment)   Frequency Add 3 additional days to program exercise sessions.   Initial Home Exercises Provided 05/22/17      Nutrition:  Target Goals: Understanding of nutrition guidelines, daily intake of sodium 1500mg , cholesterol 200mg , calories 30% from fat and 7% or less from saturated fats, daily to have 5 or more servings of fruits and vegetables.  Biometrics:     Pre Biometrics - 05/02/17 1134      Pre Biometrics   Waist Circumference 38.5 inches   Hip Circumference 39 inches   Waist to Hip Ratio 0.99 %   Triceps Skinfold 14 mm   % Body Fat 26.9 %   Grip Strength 38 kg   Flexibility 121 in   Single Leg Stand 30 seconds       Nutrition Therapy Plan and Nutrition Goals:     Nutrition Therapy & Goals - 05/02/17 1217      Nutrition Therapy   Diet Therapeutic Lifestyle Changes     Personal Nutrition Goals   Nutrition Goal Wt loss of 1-2 lb/week to a wt loss goal of 6-8 lb at graduation from [facility], educate and counsel regarding individualized specific dietary modifications aiming towards targeted core components such as weight, hypertension, lipid management, diabetes, heart failure and other comorbidities.   Expected Outcomes Short Term Goal: Understand basic principles of dietary content, such as calories, fat, sodium,  cholesterol and nutrients.;Long Term Goal: Adherence to prescribed nutrition plan.      Nutrition Discharge: Nutrition Scores:     Nutrition Assessments - 05/02/17 1217      MEDFICTS Scores   Pre Score 53      Nutrition Goals Re-Evaluation:   Nutrition Goals Re-Evaluation:   Nutrition Goals Discharge (Final Nutrition Goals Re-Evaluation):   Psychosocial: Target Goals: Acknowledge presence or absence of significant depression and/or stress, maximize coping skills, provide positive support system. Participant is able to verbalize types and ability to use techniques and skills needed for reducing stress and depression.  Initial Review & Psychosocial Screening:     Initial Psych Review & Screening - 05/02/17 1412      Initial Review   Current issues with None Identified     Family Dynamics   Good Support System? Yes     Barriers   Psychosocial barriers to participate in program The patient should benefit from training in stress management and relaxation.     Screening Interventions   Interventions Encouraged to exercise      Quality of Life Scores:     Quality of Life - 05/17/17 1056      Quality of Life Scores   Health/Function Pre 26.17 %   Socioeconomic Pre 28.21 %   Psych/Spiritual Pre 25.29 %   Family Pre 22.8 %   GLOBAL Pre 25.91 %  QOL scores reviewed with pt. pt denies current concerns.  pt exhibits overall positive outlook with good coping skills.       PHQ-9: Recent Review Flowsheet Data  Depression screen PHQ 2/9 05/06/2017   Decreased Interest 0   Down, Depressed, Hopeless 0   PHQ - 2 Score 0     Interpretation of Total Score  Total Score Depression Severity:  1-4 = Minimal depression, 5-9 = Mild depression, 10-14 = Moderate depression, 15-19 = Moderately severe depression, 20-27 = Severe depression   Psychosocial Evaluation and Intervention:     Psychosocial Evaluation - 05/06/17 0935      Psychosocial Evaluation & Interventions    Interventions Encouraged to exercise with the program and follow exercise prescription;Stress management education;Relaxation education   Comments no psychosocial needs identified, no inteventions necessary    Expected Outcomes pt will exhibit positive outlook with good coping skills.    Continue Psychosocial Services  No Follow up required      Psychosocial Re-Evaluation:     Psychosocial Re-Evaluation    [place] Name 05/17/17 1115 05/21/17 0950           Psychosocial Re-Evaluation   Current issues with None Identified None Identified      Comments no psychosocial needs identified, no interventions necessary  no psychosocial needs identified, no interventions necessary       Expected Outcomes pt will exhibit positive outlook wtih good coping skills.  pt will exhibit positive outlook wtih good coping skills.       Interventions Encouraged to attend Cardiac Rehabilitation for the exercise Encouraged to attend Cardiac Rehabilitation for the exercise      Continue Psychosocial Services  No Follow up required No Follow up required         Psychosocial Discharge (Final Psychosocial Re-Evaluation):     Psychosocial Re-Evaluation - 05/21/17 0950      Psychosocial Re-Evaluation   Current issues with None Identified   Comments no psychosocial needs identified, no interventions necessary    Expected Outcomes pt will exhibit positive outlook wtih good coping skills.    Interventions Encouraged to attend Cardiac Rehabilitation for the exercise   Continue Psychosocial Services  No Follow up required      Vocational Rehabilitation: Provide vocational rehab assistance to qualifying candidates.   Vocational Rehab Evaluation & Intervention:   Education: Education Goals: Education classes will be provided on a weekly basis, covering required topics. Participant will state understanding/return demonstration of topics presented.  Learning Barriers/Preferences:     Learning  Barriers/Preferences - 05/02/17 0827      Learning Barriers/Preferences   Learning Barriers Sight;Hearing   Learning Preferences Written Material;Video;Verbal Instruction;Skilled Demonstration;Pictoral      Education Topics: Count Your Pulse:  -Group instruction provided by verbal instruction, demonstration, patient participation and written materials to support subject.  Instructors address importance of being able to find your pulse and how to count your pulse when at home without a heart monitor.  Patients get hands on experience counting their pulse with staff help and individually.   Heart Attack, Angina, and Risk Factor Modification:  -Group instruction provided by verbal instruction, video, and written materials to support subject.  Instructors address signs and symptoms of angina and heart attacks.    Also discuss risk factors for heart disease and how to make changes to improve heart health risk factors.   CARDIAC REHAB PHASE II EXERCISE from 05/08/2017 in [place]  Date  05/08/17  Instruction Review Code  2- meets goals/outcomes      Functional Fitness:  -Group instruction provided by verbal instruction, demonstration, patient participation, and written materials to support subject.  Instructors address safety  measures for doing things around the house.  Discuss how to get up and down off the floor, how to pick things up properly, how to safely get out of a chair without assistance, and balance training.   Meditation and Mindfulness:  -Group instruction provided by verbal instruction, patient participation, and written materials to support subject.  Instructor addresses importance of mindfulness and meditation practice to help reduce stress and improve awareness.  Instructor also leads participants through a meditation exercise.    Stretching for Flexibility and Mobility:  -Group instruction provided by verbal instruction, patient  participation, and written materials to support subject.  Instructors lead participants through series of stretches that are designed to increase flexibility thus improving mobility.  These stretches are additional exercise for major muscle groups that are typically performed during regular warm up and cool down.   Hands Only CPR:  -Group verbal, video, and participation provides a basic overview of AHA guidelines for community CPR. Role-play of emergencies allow participants the opportunity to practice calling for help and chest compression technique with discussion of AED use.   Hypertension: -Group verbal and written instruction that provides a basic overview of hypertension including the most recent diagnostic guidelines, risk factor reduction with self-care instructions and medication management.    Nutrition I class: Heart Healthy Eating:  -Group instruction provided by PowerPoint slides, verbal discussion, and written materials to support subject matter. The instructor gives an explanation and review of the Therapeutic Lifestyle Changes diet recommendations, which includes a discussion on lipid goals, dietary fat, sodium, fiber, plant stanol/sterol esters, sugar, and the components of a well-balanced, healthy diet.   Nutrition II class: Lifestyle Skills:  -Group instruction provided by PowerPoint slides, verbal discussion, and written materials to support subject matter. The instructor gives an explanation and review of label reading, grocery shopping for heart health, heart healthy recipe modifications, and ways to make healthier choices when eating out.   Diabetes Question & Answer:  -Group instruction provided by PowerPoint slides, verbal discussion, and written materials to support subject matter. The instructor gives an explanation and review of diabetes co-morbidities, pre- and post-prandial blood glucose goals, pre-exercise blood glucose goals, signs, symptoms, and treatment of  hypoglycemia and hyperglycemia, and foot care basics.   Diabetes Blitz:  -Group instruction provided by PowerPoint slides, verbal discussion, and written materials to support subject matter. The instructor gives an explanation and review of the physiology behind type 1 and type 2 diabetes, diabetes medications and rational behind using different medications, pre- and post-prandial blood glucose recommendations and Hemoglobin A1c goals, diabetes diet, and exercise including blood glucose guidelines for exercising safely.    Portion Distortion:  -Group instruction provided by PowerPoint slides, verbal discussion, written materials, and food models to support subject matter. The instructor gives an explanation of serving size versus portion size, changes in portions sizes over the last 20 years, and what consists of a serving from each food group.   Stress Management:  -Group instruction provided by verbal instruction, video, and written materials to support subject matter.  Instructors review role of stress in heart disease and how to cope with stress positively.     Exercising on Your Own:  -Group instruction provided by verbal instruction, power point, and written materials to support subject.  Instructors discuss benefits of exercise, components of exercise, frequency and intensity of exercise, and end points for exercise.  Also discuss use of nitroglycerin and activating EMS.  Review options of places to exercise outside of rehab.  Review guidelines for sex with heart disease.   Cardiac Drugs I:  -Group instruction provided by verbal instruction and written materials to support subject.  Instructor reviews cardiac drug classes: antiplatelets, anticoagulants, beta blockers, and statins.  Instructor discusses reasons, side effects, and lifestyle considerations for each drug class.   Cardiac Drugs II:  -Group instruction provided by verbal instruction and written materials to support subject.   Instructor reviews cardiac drug classes: angiotensin converting enzyme inhibitors (ACE-I), angiotensin II receptor blockers (ARBs), nitrates, and calcium channel blockers.  Instructor discusses reasons, side effects, and lifestyle considerations for each drug class.   Anatomy and Physiology of the Circulatory System:  Group verbal and written instruction and models provide basic cardiac anatomy and physiology, with the coronary electrical and arterial systems. Review of: AMI, Angina, Valve disease, Heart Failure, Peripheral Artery Disease, Cardiac Arrhythmia, Pacemakers, and the ICD.   Other Education:  -Group or individual verbal, written, or video instructions that support the educational goals of the cardiac rehab program.   Knowledge Questionnaire Score:     Knowledge Questionnaire Score - 05/02/17 1133      Knowledge Questionnaire Score   Pre Score 20/24      Core Components/Risk Factors/Patient Goals at Admission:     Personal Goals and Risk Factors at Admission - 05/02/17 0831      Core Components/Risk Factors/Patient Goals on Admission    Weight Management Yes;Weight Maintenance;Weight Loss   Intervention Weight Management: Develop a combined nutrition and exercise program designed to reach desired caloric intake, while maintaining appropriate intake of nutrient and fiber, sodium and fats, and appropriate energy expenditure required for the weight goal.;Weight Management: Provide education and appropriate resources to help participant work on and attain dietary goals.;Weight Management/Obesity: Establish reasonable short term and long term weight goals.   Admit Weight 178 lb 12.7 oz (81.1 kg)   Goal Weight: Short Term 175 lb (79.4 kg)   Goal Weight: Long Term 173 lb (78.5 kg)   Expected Outcomes Short Term: Continue to assess and modify interventions until short term weight is achieved;Long Term: Adherence to nutrition and physical activity/exercise program aimed toward  attainment of established weight goal;Weight Maintenance: Understanding of the daily nutrition guidelines, which includes 25-35% calories from fat, 7% or less cal from saturated fats, less than 200mg  cholesterol, less than 1.5gm of sodium, & 5 or more servings of fruits and vegetables daily;Weight Loss: Understanding of general recommendations for a balanced deficit meal plan, which promotes 1-2 lb weight loss per week and includes a negative energy balance of [phone] kcal/d;Understanding recommendations for meals to include 15-35% energy as protein, 25-35% energy from fat, 35-60% energy from carbohydrates, less than 200mg  of dietary cholesterol, 20-35 gm of total fiber daily;Understanding of distribution of calorie intake throughout the day with the consumption of 4-5 meals/snacks   Hypertension Yes   Intervention Provide education on lifestyle modifcations including regular physical activity/exercise, weight management, moderate sodium restriction and increased consumption of fresh fruit, vegetables, and low fat dairy, alcohol moderation, and smoking cessation.;Monitor prescription use compliance.   Expected Outcomes Short Term: Continued assessment and intervention until BP is < 140/42mm HG in hypertensive participants. < 130/28mm HG in hypertensive participants with diabetes, heart failure or chronic kidney disease.;Long Term: Maintenance of blood pressure at goal levels.   Lipids Yes   Intervention Provide education and support for participant on nutrition & aerobic/resistive exercise along with prescribed medications to achieve LDL 70mg , HDL >40mg .   Expected Outcomes Short Term: Participant states understanding  of desired cholesterol values and is compliant with medications prescribed. Participant is following exercise prescription and nutrition guidelines.;Long Term: Cholesterol controlled with medications as prescribed, with individualized exercise RX and with personalized nutrition plan. Value  goals: LDL < 70mg , HDL > 40 mg.      Core Components/Risk Factors/Patient Goals Review:      Goals and Risk Factor Review    Row Name 05/17/17 1113 05/21/17 0949           Core Components/Risk Factors/Patient Goals Review   Personal Goals Review Weight Management/Obesity;Hypertension;Lipids Weight Management/Obesity;Hypertension;Lipids      Review pt with CAD RF demonstrates eagerness to participate in CR activities.  pt with CAD RF demonstrates eagerness to participate in CR activities. pt BP well managed.  pt actively working towards weight loss goal.  pt reports improved endurance with stair climbing.      Expected Outcomes pt will participate in CR exercise, nutrition and lifestyle education opportunities to reduce overall RF. pt will participate in CR exercise, nutrition and lifestyle education opportunities to reduce overall RF.         Core Components/Risk Factors/Patient Goals at Discharge (Final Review):      Goals and Risk Factor Review - 05/21/17 0949      Core Components/Risk Factors/Patient Goals Review   Personal Goals Review Weight Management/Obesity;Hypertension;Lipids   Review pt with CAD RF demonstrates eagerness to participate in CR activities. pt BP well managed.  pt actively working towards weight loss goal.  pt reports improved endurance with stair climbing.   Expected Outcomes pt will participate in CR exercise, nutrition and lifestyle education opportunities to reduce overall RF.      ITP Comments:     ITP Comments    Row Name 05/02/17 0825 05/28/17 1519         ITP Comments Medical Director, Dr. Fransico Him 30 day ITP review.  Pt with good participation and attendance.  No change in current regimen unless directed by Medical Director.           Comments:

## 2017-05-29 ENCOUNTER — Encounter (HOSPITAL_COMMUNITY)
Admission: RE | Admit: 2017-05-29 | Discharge: 2017-05-29 | Disposition: A | Discharge status: Home / Self Care | Payer: Medicare Other | Source: Ambulatory Visit | Attending: Cardiology | Admitting: Cardiology

## 2017-05-29 DIAGNOSIS — Z951 Presence of aortocoronary bypass graft: Secondary | ICD-10-CM

## 2017-05-31 ENCOUNTER — Encounter (HOSPITAL_COMMUNITY): Payer: Medicare Other

## 2017-06-03 ENCOUNTER — Encounter (HOSPITAL_COMMUNITY)
Admission: RE | Admit: 2017-06-03 | Discharge: 2017-06-03 | Disposition: A | Discharge status: Home / Self Care | Payer: Medicare Other | Source: Ambulatory Visit | Attending: Cardiology | Admitting: Cardiology

## 2017-06-03 DIAGNOSIS — Z951 Presence of aortocoronary bypass graft: Secondary | ICD-10-CM | POA: Diagnosis not present

## 2017-06-05 ENCOUNTER — Encounter (HOSPITAL_COMMUNITY): Payer: Medicare Other

## 2017-06-07 ENCOUNTER — Encounter (HOSPITAL_COMMUNITY): Payer: Medicare Other

## 2017-06-10 ENCOUNTER — Encounter (HOSPITAL_COMMUNITY)
Admission: RE | Admit: 2017-06-10 | Discharge: 2017-06-10 | Disposition: A | Discharge status: Home / Self Care | Payer: Medicare Other | Source: Ambulatory Visit | Attending: Cardiology | Admitting: Cardiology

## 2017-06-10 DIAGNOSIS — Z951 Presence of aortocoronary bypass graft: Secondary | ICD-10-CM | POA: Diagnosis not present

## 2017-06-11 DIAGNOSIS — Z85828 Personal history of other malignant neoplasm of skin: Secondary | ICD-10-CM | POA: Diagnosis not present

## 2017-06-11 DIAGNOSIS — L812 Freckles: Secondary | ICD-10-CM | POA: Diagnosis not present

## 2017-06-11 DIAGNOSIS — D1801 Hemangioma of skin and subcutaneous tissue: Secondary | ICD-10-CM | POA: Diagnosis not present

## 2017-06-11 DIAGNOSIS — L570 Actinic keratosis: Secondary | ICD-10-CM | POA: Diagnosis not present

## 2017-06-11 DIAGNOSIS — L821 Other seborrheic keratosis: Secondary | ICD-10-CM | POA: Diagnosis not present

## 2017-06-12 ENCOUNTER — Encounter (HOSPITAL_COMMUNITY): Payer: Medicare Other

## 2017-06-14 ENCOUNTER — Encounter (HOSPITAL_COMMUNITY): Payer: Medicare Other

## 2017-06-17 ENCOUNTER — Encounter (HOSPITAL_COMMUNITY)
Admission: RE | Admit: 2017-06-17 | Discharge: 2017-06-17 | Disposition: A | Discharge status: Home / Self Care | Payer: Medicare Other | Source: Ambulatory Visit | Attending: Cardiology | Admitting: Cardiology

## 2017-06-17 ENCOUNTER — Encounter (HOSPITAL_COMMUNITY): Payer: Self-pay | Admitting: Surgery

## 2017-06-17 DIAGNOSIS — Z951 Presence of aortocoronary bypass graft: Secondary | ICD-10-CM

## 2017-06-17 NOTE — Addendum Note (Signed)
Addendum  created 06/17/17 1547 by Josephine Igo, CRNA   Anesthesia Event deleted, Anesthesia Event edited

## 2017-06-18 ENCOUNTER — Ambulatory Visit: Payer: Medicare Other | Admitting: Internal Medicine

## 2017-06-18 DIAGNOSIS — Z85828 Personal history of other malignant neoplasm of skin: Secondary | ICD-10-CM | POA: Diagnosis not present

## 2017-06-18 DIAGNOSIS — L570 Actinic keratosis: Secondary | ICD-10-CM | POA: Diagnosis not present

## 2017-06-19 ENCOUNTER — Encounter (HOSPITAL_COMMUNITY)
Admission: RE | Admit: 2017-06-19 | Discharge: 2017-06-19 | Disposition: A | Discharge status: Home / Self Care | Payer: Medicare Other | Source: Ambulatory Visit | Attending: Cardiology | Admitting: Cardiology

## 2017-06-19 DIAGNOSIS — Z951 Presence of aortocoronary bypass graft: Secondary | ICD-10-CM

## 2017-06-21 ENCOUNTER — Encounter (HOSPITAL_COMMUNITY)
Admission: RE | Admit: 2017-06-21 | Discharge: 2017-06-21 | Disposition: A | Discharge status: Home / Self Care | Payer: Medicare Other | Source: Ambulatory Visit | Attending: Cardiology | Admitting: Cardiology

## 2017-06-21 DIAGNOSIS — Z951 Presence of aortocoronary bypass graft: Secondary | ICD-10-CM | POA: Insufficient documentation

## 2017-06-21 NOTE — Progress Notes (Signed)
Cardiac Individual Treatment Plan  Patient Details  Name: Jordan [name] MRN: [id number] Date of Birth: 06-28-1941 Referring Provider:     CARDIAC REHAB PHASE II ORIENTATION from 05/02/2017 in [place]  Referring Provider  Glenetta Hew MD      Initial Encounter Date:    CARDIAC REHAB PHASE II ORIENTATION from 05/02/2017 in [facility]  Date  05/02/17  Referring Provider  Glenetta Hew MD      Visit Diagnosis: S/P CABG x 4  Patient's Home Medications on Admission:  Current Outpatient Prescriptions:  .  aspirin 325 MG EC tablet, Take 1 tablet (325 mg total) by mouth daily., Disp: 30 tablet, Rfl: 0 .  Calcium Citrate-Vitamin D (CITRACAL MAXIMUM) 315-250 MG-UNIT TABS, Take 1 tablet by mouth 2 (two) times daily. , Disp: , Rfl:  .  cholecalciferol (VITAMIN D) 1000 UNITS tablet, Take 1,000 Units by mouth 2 (two) times daily. , Disp: , Rfl:  .  Cyanocobalamin (VITAMIN B-12 PO), Take 1 tablet by mouth every evening. , Disp: , Rfl:  .  diphenhydramine-acetaminophen (TYLENOL PM) 25-500 MG TABS tablet, Take 0.5 tablets by mouth at bedtime as needed (for sleep.)., Disp: , Rfl:  .  ezetimibe (ZETIA) 10 MG tablet, Take 10 mg by mouth every evening. , Disp: , Rfl:  .  furosemide (LASIX) 40 MG tablet, Take 1 tablet (40 mg total) by mouth daily as needed., Disp: , Rfl:  .  metoprolol tartrate (LOPRESSOR) 25 MG tablet, Take 0.5 tablets (12.5 mg total) by mouth 2 (two) times daily., Disp: 30 tablet, Rfl: 1 .  Multiple Vitamins-Minerals (CENTRUM SILVER PO), Take 1 tablet by mouth daily., Disp: , Rfl:  .  pravastatin (PRAVACHOL) 20 MG tablet, Take 1 tablet (20 mg total) by mouth daily at 6 PM., Disp: 30 tablet, Rfl: 1 .  ranitidine (ZANTAC) 150 MG tablet, Take 150 mg by mouth 2 (two) times daily., Disp: , Rfl:   Past Medical History: Past Medical History:  Diagnosis Date  . Coronary artery disease   . DDD (degenerative disc  disease)   . Dyspnea   . ED (erectile dysfunction)   . GERD (gastroesophageal reflux disease)   . History of hiatal hernia   . History of kidney stones   . HTN (hypertension)   . Hyperlipidemia   . Impaired fasting glucose   . Knee pain    bilateral  . Meniere's disease   . Meniere's vertigo   . Mild carotid artery disease ([place])   . Ocular migraine   . Shoulder pain   . Wears hearing aid     Tobacco Use: History  Smoking Status  . Never Smoker  Smokeless Tobacco  . Never Used    Labs: Recent Review Flowsheet Data    Labs for ITP Cardiac and Pulmonary Rehab Latest Ref Rng & Units 04/04/2017 04/04/2017 04/04/2017 04/04/2017 04/05/2017   Hemoglobin A1c 4.8 - 5.6 % - - - - -   PHART 7.350 - 7.450 7.417 7.360 - 7.355 -   PCO2ART 32.0 - 48.0 mmHg 39.3 44.9 - 42.9 -   HCO3 20.0 - 28.0 mmol/L 25.5 25.2 - 23.7 -   TCO2 0 - 100 mmol/L 27 27 24 25 22    ACIDBASEDEF 0.0 - 2.0 mmol/L - - - 2.0 -   O2SAT % 95.0 97.0 - 97.0 -      Capillary Blood Glucose: Lab Results  Component Value Date   GLUCAP 139 (H) 04/05/2017  GLUCAP 101 (H) 04/05/2017   GLUCAP 102 (H) 04/05/2017   GLUCAP 96 04/04/2017   GLUCAP 96 04/04/2017     Exercise Target Goals:    Exercise Program Goal: Individual exercise prescription set with THRR, safety & activity barriers. Participant demonstrates ability to understand and report RPE using BORG scale, to self-measure pulse accurately, and to acknowledge the importance of the exercise prescription.  Exercise Prescription Goal: Starting with aerobic activity 30 plus minutes a day, 3 days per week for initial exercise prescription. Provide home exercise prescription and guidelines that participant acknowledges understanding prior to discharge.  Activity Barriers & Risk Stratification:     Activity Barriers & Cardiac Risk Stratification - 05/02/17 0828      Activity Barriers & Cardiac Risk Stratification   Activity Barriers Neck/Spine Problems;Back  Problems;Other (comment)   Comments R RTC surgery Oct. 2017; partial R knee replacement 6 yrs ago; B knee scopes; compressed L4-L5   Cardiac Risk Stratification High      6 Minute Walk:     6 Minute Walk    Row Name 05/02/17 1133         6 Minute Walk   Phase Initial     Distance 1838 feet     Walk Time 6 minutes     # of Rest Breaks 0     MPH 3.48     METS 3.3     RPE 11     Symptoms No     Resting HR 65 bpm     Resting BP 124/60     Resting Oxygen Saturation  95 %     Exercise Oxygen Saturation  during 6 min walk 95 %     Max Ex. HR 82 bpm     Max Ex. BP 124/60     2 Minute Post BP 118/60        Oxygen Initial Assessment:   Oxygen Re-Evaluation:   Oxygen Discharge (Final Oxygen Re-Evaluation):   Initial Exercise Prescription:     Initial Exercise Prescription - 05/02/17 1100      Date of Initial Exercise RX and Referring Provider   Date 05/02/17   Referring Provider Glenetta Hew MD     Bike   Level 0.8   Minutes 10   METs 2.89     NuStep   Level 3   SPM 80   Minutes 10   METs 2.5     Track   Laps 11   Minutes 10   METs 2.92     Prescription Details   Frequency (times per week) 3   Duration Progress to 30 minutes of continuous aerobic without signs/symptoms of physical distress     Intensity   THRR 40-80% of Max Heartrate 58-117   Ratings of Perceived Exertion 11-13   Perceived Dyspnea 0-4     Progression   Progression Continue to progress workloads to maintain intensity without signs/symptoms of physical distress.     Resistance Training   Training Prescription Yes   Weight 3lbs   Reps 10-15      Perform Capillary Blood Glucose checks as needed.  Exercise Prescription Changes:     Exercise Prescription Changes    Row Name 05/07/17 1600 05/20/17 1600 05/22/17 1100 06/03/17 0800 06/17/17 0800     Response to Exercise   Blood Pressure (Admit) 121/64 120/70 120/80 128/60 122/66   Blood Pressure (Exercise) 120/70 138/82  120/64 140/60 158/78   Blood Pressure (Exit) 120/64 104/60 120/70 120/62 124/72  Heart Rate (Admit) 66 bpm 57 bpm 60 bpm 62 bpm 57 bpm   Heart Rate (Exercise) 81 bpm 82 bpm 89 bpm 90 bpm 84 bpm   Heart Rate (Exit) 68 bpm 57 bpm 60 bpm 62 bpm 67 bpm   Rating of Perceived Exertion (Exercise) 12 15 12 14 13    Symptoms none none none none none   Duration Continue with 30 min of aerobic exercise without signs/symptoms of physical distress. Continue with 30 min of aerobic exercise without signs/symptoms of physical distress. Continue with 30 min of aerobic exercise without signs/symptoms of physical distress. Continue with 30 min of aerobic exercise without signs/symptoms of physical distress. Continue with 30 min of aerobic exercise without signs/symptoms of physical distress.   Intensity THRR unchanged THRR unchanged THRR unchanged THRR unchanged THRR unchanged     Progression   Progression Continue to progress workloads to maintain intensity without signs/symptoms of physical distress. Continue to progress workloads to maintain intensity without signs/symptoms of physical distress. Continue to progress workloads to maintain intensity without signs/symptoms of physical distress. Continue to progress workloads to maintain intensity without signs/symptoms of physical distress. Continue to progress workloads to maintain intensity without signs/symptoms of physical distress.   Average METs 3.3 3.8 4.2 4.5 4.3     Resistance Training   Training Prescription Yes Yes Yes Yes Yes   Weight 3lbs 3lbs 3lbs 5lbs 5lbs   Reps 10-15 10-15 10-15 10-15 10-15   Time  - 10 Minutes 10 Minutes 10 Minutes 10 Minutes     Interval Training   Interval Training No No No No No     Bike   Level 0.8 1 1.5 1.7 1.7   Minutes 10 10 10 10 10    METs 2.86 3.32 4.49 4.93 4.94     NuStep   Level 3 5 5 5 6    SPM 90 90 90 90 90   Minutes 10 10 10 10 10    METs 3.9 5.1 4.6 4.8 4.1     Track   Laps 13 11 15 16 16    Minutes  10 10 10 10 10    METs 3.26 2.91 3.6 3.79 3.79     Home Exercise Plan   Plans to continue exercise at  -  - Home (comment) Home (comment) Home (comment)   Frequency  -  - Add 3 additional days to program exercise sessions. Add 3 additional days to program exercise sessions. Add 3 additional days to program exercise sessions.   Initial Home Exercises Provided  -  - 05/22/17 05/22/17 05/22/17      Exercise Comments:     Exercise Comments    Row Name 05/08/17 0915 05/22/17 0725 06/03/17 7564 06/17/17 3329     Exercise Comments Reviewed METs with patient. Reviewed METs, goals and home exercise prescription with patient. Reviewed METs with patient. Reviewed goals with patient.       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       Expected Outcomes Achievement of increased cardiorespiratory fitness and enhanced flexibility, muscular endurance and strength  shown through measurements of functional capacity and personal statement of participant.       Increase Strength and Stamina Yes  get back to exercise routine of sprints and push ups       Intervention Provide advice, education, support and counseling about physical activity/exercise needs.;Develop an individualized exercise prescription for aerobic and resistive training based on initial evaluation findings, risk stratification, comorbidities and participant's personal goals.       Expected Outcomes Achievement of increased cardiorespiratory fitness and enhanced flexibility, muscular endurance and strength shown through  measurements of functional capacity and personal statement of participant.       Able to understand and use rate of perceived exertion (RPE) scale Yes       Intervention Provide education and explanation on how to use RPE scale       Expected Outcomes Short Term: Able to use RPE daily in rehab to express subjective intensity level;Long Term:  Able to use RPE to guide intensity level when exercising independently       Knowledge and understanding of Target Heart Rate Range (THRR) Yes       Intervention Provide education and explanation of THRR including how the numbers were predicted and where they are located for reference       Expected Outcomes Short Term: Able to state/look up THRR;Short Term: Able to use daily as guideline for intensity in rehab;Long Term: Able to use THRR to govern intensity when exercising independently       Able to check pulse independently Yes       Intervention Provide education and demonstration on how to check pulse in carotid and radial arteries.;Review the importance of being able to check your own pulse for safety during independent exercise       Expected Outcomes Short Term: Able to explain why pulse checking is important during independent exercise;Long Term: Able to check pulse independently and accurately       Understanding of Exercise Prescription Yes       Intervention Provide education, explanation, and written materials on patient's individual exercise prescription       Expected Outcomes Short Term: Able to explain program exercise prescription;Long Term: Able to explain home exercise prescription to exercise independently          Exercise Goals Re-Evaluation :     Exercise Goals Re-Evaluation    Row Name 05/22/17 1123 06/17/17 0651           Exercise Goal Re-Evaluation   Exercise Goals Review Increase Physical Activity;Understanding of Exercise Prescription;Able to understand and use rate of perceived exertion (RPE) scale;Knowledge and  understanding of Target Heart Rate Range (THRR) Increase Physical Activity      Comments Reviewed home exercise prescription with patient including THRR, RPE scale, and endpoints for exercise. 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 Patient states he's walking 15 minutes, 5 days/week. Encouraged pt to increase duration gradually to 30 minutes, 5 days/week, and pt is agreeable to this.      Expected Outcomes Continue walking in addition to exercise at cardiac rehab to build strength and stamina to do ADLs. Increase walking duration to 30 minutes, 5 days/week.          Discharge Exercise Prescription (Final Exercise Prescription Changes):     Exercise Prescription Changes - 06/17/17 0800      Response to Exercise   Blood Pressure (Admit) 122/66   Blood Pressure (Exercise)  158/78   Blood Pressure (Exit) 124/72   Heart Rate (Admit) 57 bpm   Heart Rate (Exercise) 84 bpm   Heart Rate (Exit) 67 bpm   Rating of Perceived Exertion (Exercise) 13   Symptoms none   Duration Continue with 30 min of aerobic exercise without signs/symptoms of physical distress.   Intensity THRR unchanged     Progression   Progression Continue to progress workloads to maintain intensity without signs/symptoms of physical distress.   Average METs 4.3     Resistance Training   Training Prescription Yes   Weight 5lbs   Reps 10-15   Time 10 Minutes     Interval Training   Interval Training No     Bike   Level 1.7   Minutes 10   METs 4.94     NuStep   Level 6   SPM 90   Minutes 10   METs 4.1     Track   Laps 16   Minutes 10   METs 3.79     Home Exercise Plan   Plans to continue exercise at Home (comment)   Frequency Add 3 additional days to program exercise sessions.   Initial Home Exercises Provided 05/22/17      Nutrition:  Target Goals: Understanding of nutrition guidelines, daily intake of sodium 1500mg , cholesterol 200mg , calories  30% from fat and 7% or less from saturated fats, daily to have 5 or more servings of fruits and vegetables.  Biometrics:     Pre Biometrics - 05/02/17 1134      Pre Biometrics   Waist Circumference 38.5 inches   Hip Circumference 39 inches   Waist to Hip Ratio 0.99 %   Triceps Skinfold 14 mm   % Body Fat 26.9 %   Grip Strength 38 kg   Flexibility 121 in   Single Leg Stand 30 seconds       Nutrition Therapy Plan and Nutrition Goals:     Nutrition Therapy & Goals - 05/02/17 1217      Nutrition Therapy   Diet Therapeutic Lifestyle Changes     Personal Nutrition Goals   Nutrition Goal Wt loss of 1-2 lb/week to a wt loss goal of 6-8 lb at graduation from [place], educate and counsel regarding individualized specific dietary modifications aiming towards targeted core components such as weight, hypertension, lipid management, diabetes, heart failure and other comorbidities.   Expected Outcomes Short Term Goal: Understand basic principles of dietary content, such as calories, fat, sodium, cholesterol and nutrients.;Long Term Goal: Adherence to prescribed nutrition plan.      Nutrition Discharge: Nutrition Scores:     Nutrition Assessments - 05/02/17 1217      MEDFICTS Scores   Pre Score 53      Nutrition Goals Re-Evaluation:   Nutrition Goals Re-Evaluation:   Nutrition Goals Discharge (Final Nutrition Goals Re-Evaluation):   Psychosocial: Target Goals: Acknowledge presence or absence of significant depression and/or stress, maximize coping skills, provide positive support system. Participant is able to verbalize types and ability to use techniques and skills needed for reducing stress and depression.  Initial Review & Psychosocial Screening:     Initial Psych Review & Screening - 05/02/17 1412      Initial Review   Current issues with None Identified     Family Dynamics   Good Support System? Yes      Barriers   Psychosocial barriers to participate in program The  patient should benefit from training in stress management and relaxation.     Screening Interventions   Interventions Encouraged to exercise      Quality of Life Scores:     Quality of Life - 05/17/17 1056      Quality of Life Scores   Health/Function Pre 26.17 %   Socioeconomic Pre 28.21 %   Psych/Spiritual Pre 25.29 %   Family Pre 22.8 %   GLOBAL Pre 25.91 %  QOL scores reviewed with pt. pt denies current concerns.  pt exhibits overall positive outlook with good coping skills.       PHQ-9: Recent Review Flowsheet Data    Depression screen [facility] 2/9 05/06/2017   Decreased Interest 0   Down, Depressed, Hopeless 0   PHQ - 2 Score 0     Interpretation of Total Score  Total Score Depression Severity:  1-4 = Minimal depression, 5-9 = Mild depression, 10-14 = Moderate depression, 15-19 = Moderately severe depression, 20-27 = Severe depression   Psychosocial Evaluation and Intervention:     Psychosocial Evaluation - 05/06/17 0935      Psychosocial Evaluation & Interventions   Interventions Encouraged to exercise with the program and follow exercise prescription;Stress management education;Relaxation education   Comments no psychosocial needs identified, no inteventions necessary    Expected Outcomes pt will exhibit positive outlook with good coping skills.    Continue Psychosocial Services  No Follow up required      Psychosocial Re-Evaluation:     Psychosocial Re-Evaluation    [place] Name 05/17/17 1115 05/21/17 0950 06/18/17 0741         Psychosocial Re-Evaluation   Current issues with None Identified None Identified None Identified     Comments no psychosocial needs identified, no interventions necessary  no psychosocial needs identified, no interventions necessary  no psychosocial needs identified, no interventions necessary      Expected Outcomes pt will exhibit positive outlook wtih good coping skills.   pt will exhibit positive outlook wtih good coping skills.  pt will exhibit positive outlook wtih good coping skills.      Interventions Encouraged to attend Cardiac Rehabilitation for the exercise Encouraged to attend Cardiac Rehabilitation for the exercise Encouraged to attend Cardiac Rehabilitation for the exercise     Continue Psychosocial Services  No Follow up required No Follow up required No Follow up required        Psychosocial Discharge (Final Psychosocial Re-Evaluation):     Psychosocial Re-Evaluation - 06/18/17 0741      Psychosocial Re-Evaluation   Current issues with None Identified   Comments no psychosocial needs identified, no interventions necessary    Expected Outcomes pt will exhibit positive outlook wtih good coping skills.    Interventions Encouraged to attend Cardiac Rehabilitation for the exercise   Continue Psychosocial Services  No Follow up required      Vocational Rehabilitation: Provide vocational rehab assistance to qualifying candidates.   Vocational Rehab Evaluation & Intervention:   Education: Education Goals: Education classes will be provided on a weekly basis, covering required topics. Participant will state understanding/return demonstration of topics presented.  Learning Barriers/Preferences:     Learning Barriers/Preferences - 05/02/17 0827      Learning Barriers/Preferences   Learning Barriers Sight;Hearing   Learning Preferences Written Material;Video;Verbal Instruction;Skilled Demonstration;Pictoral      Education Topics: Count Your Pulse:  -Group instruction provided by verbal instruction, demonstration, patient participation and written materials to support subject.  Instructors address importance of being able to  find your pulse and how to count your pulse when at home without a heart monitor.  Patients get hands on experience counting their pulse with staff help and individually.   Heart Attack, Angina, and Risk Factor  Modification:  -Group instruction provided by verbal instruction, video, and written materials to support subject.  Instructors address signs and symptoms of angina and heart attacks.    Also discuss risk factors for heart disease and how to make changes to improve heart health risk factors.   CARDIAC REHAB PHASE II EXERCISE from 05/08/2017 in [facility]  Date  05/08/17  Instruction Review Code  2- meets goals/outcomes      Functional Fitness:  -Group instruction provided by verbal instruction, demonstration, patient participation, and written materials to support subject.  Instructors address safety measures for doing things around the house.  Discuss how to get up and down off the floor, how to pick things up properly, how to safely get out of a chair without assistance, and balance training.   Meditation and Mindfulness:  -Group instruction provided by verbal instruction, patient participation, and written materials to support subject.  Instructor addresses importance of mindfulness and meditation practice to help reduce stress and improve awareness.  Instructor also leads participants through a meditation exercise.    Stretching for Flexibility and Mobility:  -Group instruction provided by verbal instruction, patient participation, and written materials to support subject.  Instructors lead participants through series of stretches that are designed to increase flexibility thus improving mobility.  These stretches are additional exercise for major muscle groups that are typically performed during regular warm up and cool down.   Hands Only CPR:  -Group verbal, video, and participation provides a basic overview of AHA guidelines for community CPR. Role-play of emergencies allow participants the opportunity to practice calling for help and chest compression technique with discussion of AED use.   Hypertension: -Group verbal and written instruction that  provides a basic overview of hypertension including the most recent diagnostic guidelines, risk factor reduction with self-care instructions and medication management.    Nutrition I class: Heart Healthy Eating:  -Group instruction provided by PowerPoint slides, verbal discussion, and written materials to support subject matter. The instructor gives an explanation and review of the Therapeutic Lifestyle Changes diet recommendations, which includes a discussion on lipid goals, dietary fat, sodium, fiber, plant stanol/sterol esters, sugar, and the components of a well-balanced, healthy diet.   Nutrition II class: Lifestyle Skills:  -Group instruction provided by PowerPoint slides, verbal discussion, and written materials to support subject matter. The instructor gives an explanation and review of label reading, grocery shopping for heart health, heart healthy recipe modifications, and ways to make healthier choices when eating out.   Diabetes Question & Answer:  -Group instruction provided by PowerPoint slides, verbal discussion, and written materials to support subject matter. The instructor gives an explanation and review of diabetes co-morbidities, pre- and post-prandial blood glucose goals, pre-exercise blood glucose goals, signs, symptoms, and treatment of hypoglycemia and hyperglycemia, and foot care basics.   Diabetes Blitz:  -Group instruction provided by PowerPoint slides, verbal discussion, and written materials to support subject matter. The instructor gives an explanation and review of the physiology behind type 1 and type 2 diabetes, diabetes medications and rational behind using different medications, pre- and post-prandial blood glucose recommendations and Hemoglobin A1c goals, diabetes diet, and exercise including blood glucose guidelines for exercising safely.    Portion Distortion:  -Group instruction provided by  PowerPoint slides, verbal discussion, written materials, and food  models to support subject matter. The instructor gives an explanation of serving size versus portion size, changes in portions sizes over the last 20 years, and what consists of a serving from each food group.   Stress Management:  -Group instruction provided by verbal instruction, video, and written materials to support subject matter.  Instructors review role of stress in heart disease and how to cope with stress positively.     Exercising on Your Own:  -Group instruction provided by verbal instruction, power point, and written materials to support subject.  Instructors discuss benefits of exercise, components of exercise, frequency and intensity of exercise, and end points for exercise.  Also discuss use of nitroglycerin and activating EMS.  Review options of places to exercise outside of rehab.  Review guidelines for sex with heart disease.   Cardiac Drugs I:  -Group instruction provided by verbal instruction and written materials to support subject.  Instructor reviews cardiac drug classes: antiplatelets, anticoagulants, beta blockers, and statins.  Instructor discusses reasons, side effects, and lifestyle considerations for each drug class.   Cardiac Drugs II:  -Group instruction provided by verbal instruction and written materials to support subject.  Instructor reviews cardiac drug classes: angiotensin converting enzyme inhibitors (ACE-I), angiotensin II receptor blockers (ARBs), nitrates, and calcium channel blockers.  Instructor discusses reasons, side effects, and lifestyle considerations for each drug class.   Anatomy and Physiology of the Circulatory System:  Group verbal and written instruction and models provide basic cardiac anatomy and physiology, with the coronary electrical and arterial systems. Review of: AMI, Angina, Valve disease, Heart Failure, Peripheral Artery Disease, Cardiac Arrhythmia, Pacemakers, and the ICD.   Other Education:  -Group or individual verbal,  written, or video instructions that support the educational goals of the cardiac rehab program.   Knowledge Questionnaire Score:     Knowledge Questionnaire Score - 05/02/17 1133      Knowledge Questionnaire Score   Pre Score 20/24      Core Components/Risk Factors/Patient Goals at Admission:     Personal Goals and Risk Factors at Admission - 05/02/17 0831      Core Components/Risk Factors/Patient Goals on Admission    Weight Management Yes;Weight Maintenance;Weight Loss   Intervention Weight Management: Develop a combined nutrition and exercise program designed to reach desired caloric intake, while maintaining appropriate intake of nutrient and fiber, sodium and fats, and appropriate energy expenditure required for the weight goal.;Weight Management: Provide education and appropriate resources to help participant work on and attain dietary goals.;Weight Management/Obesity: Establish reasonable short term and long term weight goals.   Admit Weight 178 lb 12.7 oz (81.1 kg)   Goal Weight: Short Term 175 lb (79.4 kg)   Goal Weight: Long Term 173 lb (78.5 kg)   Expected Outcomes Short Term: Continue to assess and modify interventions until short term weight is achieved;Long Term: Adherence to nutrition and physical activity/exercise program aimed toward attainment of established weight goal;Weight Maintenance: Understanding of the daily nutrition guidelines, which includes 25-35% calories from fat, 7% or less cal from saturated fats, less than 200mg  cholesterol, less than 1.5gm of sodium, & 5 or more servings of fruits and vegetables daily;Weight Loss: Understanding of general recommendations for a balanced deficit meal plan, which promotes 1-2 lb weight loss per week and includes a negative energy balance of [phone] kcal/d;Understanding recommendations for meals to include 15-35% energy as protein, 25-35% energy from fat, 35-60% energy from carbohydrates, less than 200mg  of  dietary  cholesterol, 20-35 gm of total fiber daily;Understanding of distribution of calorie intake throughout the day with the consumption of 4-5 meals/snacks   Hypertension Yes   Intervention Provide education on lifestyle modifcations including regular physical activity/exercise, weight management, moderate sodium restriction and increased consumption of fresh fruit, vegetables, and low fat dairy, alcohol moderation, and smoking cessation.;Monitor prescription use compliance.   Expected Outcomes Short Term: Continued assessment and intervention until BP is < 140/64mm HG in hypertensive participants. < 130/87mm HG in hypertensive participants with diabetes, heart failure or chronic kidney disease.;Long Term: Maintenance of blood pressure at goal levels.   Lipids Yes   Intervention Provide education and support for participant on nutrition & aerobic/resistive exercise along with prescribed medications to achieve LDL 70mg , HDL >40mg .   Expected Outcomes Short Term: Participant states understanding of desired cholesterol values and is compliant with medications prescribed. Participant is following exercise prescription and nutrition guidelines.;Long Term: Cholesterol controlled with medications as prescribed, with individualized exercise RX and with personalized nutrition plan. Value goals: LDL < 70mg , HDL > 40 mg.      Core Components/Risk Factors/Patient Goals Review:      Goals and Risk Factor Review    Row Name 05/17/17 1113 05/21/17 0949 06/18/17 0740         Core Components/Risk Factors/Patient Goals Review   Personal Goals Review Weight Management/Obesity;Hypertension;Lipids Weight Management/Obesity;Hypertension;Lipids Weight Management/Obesity;Hypertension;Lipids     Review pt with CAD RF demonstrates eagerness to participate in CR activities.  pt with CAD RF demonstrates eagerness to participate in CR activities. pt BP well managed.  pt actively working towards weight loss goal.  pt reports  improved endurance with stair climbing. pt with CAD RF demonstrates eagerness to participate in CR activities. pt BP well managed.  pt actively working towards weight loss goal.  pt reports improved endurance with stair climbing.     Expected Outcomes pt will participate in CR exercise, nutrition and lifestyle education opportunities to reduce overall RF. pt will participate in CR exercise, nutrition and lifestyle education opportunities to reduce overall RF. pt will participate in CR exercise, nutrition and lifestyle education opportunities to reduce overall RF.        Core Components/Risk Factors/Patient Goals at Discharge (Final Review):      Goals and Risk Factor Review - 06/18/17 0740      Core Components/Risk Factors/Patient Goals Review   Personal Goals Review Weight Management/Obesity;Hypertension;Lipids   Review pt with CAD RF demonstrates eagerness to participate in CR activities. pt BP well managed.  pt actively working towards weight loss goal.  pt reports improved endurance with stair climbing.   Expected Outcomes pt will participate in CR exercise, nutrition and lifestyle education opportunities to reduce overall RF.      ITP Comments:     ITP Comments    Row Name 05/02/17 0825 05/28/17 1519 06/18/17 0740       ITP Comments Medical Director, Dr. Fransico Him 30 day ITP review.  Pt with good participation and attendance.  No change in current regimen unless directed by Medical Director.   30 day ITP review.  Pt with good participation and attendance.  No change in current regimen unless directed by Medical Director.          Comments:

## 2017-06-24 ENCOUNTER — Encounter (HOSPITAL_COMMUNITY)
Admission: RE | Admit: 2017-06-24 | Discharge: 2017-06-24 | Disposition: A | Discharge status: Home / Self Care | Payer: Medicare Other | Source: Ambulatory Visit | Attending: Cardiology | Admitting: Cardiology

## 2017-06-24 ENCOUNTER — Ambulatory Visit: Payer: Medicare Other | Admitting: Internal Medicine

## 2017-06-24 DIAGNOSIS — Z951 Presence of aortocoronary bypass graft: Secondary | ICD-10-CM | POA: Diagnosis not present

## 2017-06-26 ENCOUNTER — Encounter (HOSPITAL_COMMUNITY)
Admission: RE | Admit: 2017-06-26 | Discharge: 2017-06-26 | Disposition: A | Discharge status: Home / Self Care | Payer: Medicare Other | Source: Ambulatory Visit | Attending: Cardiology | Admitting: Cardiology

## 2017-06-26 DIAGNOSIS — Z951 Presence of aortocoronary bypass graft: Secondary | ICD-10-CM | POA: Diagnosis not present

## 2017-06-28 ENCOUNTER — Telehealth (HOSPITAL_COMMUNITY): Payer: Self-pay | Admitting: *Deleted

## 2017-06-28 ENCOUNTER — Encounter (HOSPITAL_COMMUNITY)
Admission: RE | Admit: 2017-06-28 | Discharge: 2017-06-28 | Disposition: A | Discharge status: Home / Self Care | Payer: Medicare Other | Source: Ambulatory Visit | Attending: Cardiology | Admitting: Cardiology

## 2017-06-28 DIAGNOSIS — Z951 Presence of aortocoronary bypass graft: Secondary | ICD-10-CM

## 2017-06-28 NOTE — Telephone Encounter (Signed)
-----   Message from Leonie Man, MD sent at 06/27/2017  1:18 PM EST ----- Regarding: RE: High intensity interval training clearance I have not seen him yet post-op.  But HIT sounds reasonable.  DH ----- Message ----- From: Sol Passer Sent: 06/26/2017   4:43 PM To: Leonie Man, MD Subject: High intensity interval training clearance     Your patient Jordan [name] is interested in doing high intensity interval training (HIIT) in Cardiac Rehab.  He has been in program for 8 weeks and has been doing great.  We would like to change his exercise prescription to include HIIT.  Their current THR is 58-117 (40-80% age predicted max) and we would like to increase it to 139 max (95 %) for HIIT. His RPE levels for the HIIT would reach up to 16-17 and active rest would be 11-13.  He would start at 2 min of active rest and 30 sec of high intensity and progress as tolerated. If you are agreeable to this change in exercise prescription please let us know.   Thanks so much for your help! Sol Passer, MS, ACSM CEP

## 2017-07-01 ENCOUNTER — Encounter (HOSPITAL_COMMUNITY): Payer: Medicare Other

## 2017-07-03 ENCOUNTER — Encounter (HOSPITAL_COMMUNITY): Payer: Medicare Other

## 2017-07-05 ENCOUNTER — Encounter (HOSPITAL_COMMUNITY): Payer: Medicare Other

## 2017-07-06 ENCOUNTER — Encounter: Payer: Self-pay | Admitting: Cardiology

## 2017-07-06 NOTE — Progress Notes (Signed)
PCP: Crist Infante, MD  Clinic Note: Chief Complaint  Patient presents with  . Follow-up    Post CABG --no complaints  . Coronary Artery Disease    Major symptom was exertional dyspnea    HPI: Jordan [name] is a 76 y.o. male with a PMH below who presents today for 67-month follow-up status post CABG in August 2018. Jordan [name]  was initially seen on January 28, 2017 for the evaluation of progressively worsening exertional dyspnea/atypical angina at the request of Crist Infante, MD.  Initial evaluation: Myoview stress test 02/12/2017--> read as INTERMEDIATE RISK with an anterior perfusion defect and grossly abnormal EKG portion.    Cardiac Catheterization 03/18/2017: 55% dLM, p-mLAD 75% - mLAD 75%.  EF 55-65%. Mod non-obstructive RCA disease --> OP CVTS c/s (Dr. Cyndia Bent March 27, 2017)  CABG x4 April 04, 2017: LIMA-LAD, SVG-D2, SVG-OM, SVG-PDA (endoscopic SVG harvesting from right leg).  Intraop TEE - EF 55-60%, mild MR  Discharged home on POD #4 (8/41) - NO complications.  Has been active with cardiac rehab  Jordan [name] was seen for his initial postop visit by Almyra Deforest, PA -->was doing quite well.  Able to walk around 45 minutes without any exertional dyspnea.  His preop exertional dyspnea has essentially resolved. -He was seen the same day by Dr. Cyndia Bent --was given limitation of no lifting greater than 10 pounds for 3 months postop, but was released to drive.  Recent Hospitalizations:   August 16-20, 2018 for CABGx4 -> relatively uneventful postop course  Studies Personally Reviewed -see above  PMH/PSH updated  Interval History: Jordan [name] returns today for his first doctor visit after CABG.  Is now doing cardiac rehab and enjoying it.  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  He was hesitant to use the axe yet.  Dr. Cyndia Bent has given him the green light full activities, but he was not  sure what his limitations were. He is fully active now and doing sprints high intensity training with cardiac rehab and enjoying it.  He says it is hard but it is nice to be a do it.  He is not noticing any more of the exertional dyspnea related to have a stress test.  He never did any chest tightness pressure at rest or exertion. He denied any PND, orthopnea or edema.  No rapid irregular heartbeats or palpitations.  He has had a few episodes of lightheadedness when he first got home from the hospital, no further episodes.  No syncope/near syncope or TIA/amaurosis fugax symptoms.  No melena, hematochezia, hematuria, or epistaxis.  No claudication  All told, he is very happy that he was able to be evaluated and treated with surgery prior to having any damage done to his heart.  ROS: A comprehensive was performed. Review of Systems  Constitutional: Negative for malaise/fatigue and weight loss (He gained back some of the weight that he lost in the hospital).  HENT: Negative for congestion, hearing loss and nosebleeds.   Respiratory: Negative for cough, shortness of breath and wheezing.   Gastrointestinal: Negative for blood in stool, constipation and heartburn.  Genitourinary: Negative for frequency and hematuria.  Musculoskeletal: Positive for joint pain (Mild arthritis pains). Negative for falls and myalgias.  Neurological: Negative for dizziness and focal weakness.  Endo/Heme/Allergies: Negative for environmental allergies. Does not bruise/bleed easily.  Psychiatric/Behavioral: Negative for depression and memory loss. The patient is not  nervous/anxious and does not have insomnia.   All other systems reviewed and are negative.  I have reviewed and (if needed) personally updated the patient's problem list, medications, allergies, past medical and surgical history, social and family history.   Past Medical History:  Diagnosis Date  . DDD (degenerative disc disease)   . Dyspnea   . ED (erectile  dysfunction)   . GERD (gastroesophageal reflux disease)   . History of hiatal hernia   . History of kidney stones   . HTN (hypertension)   . Hyperlipidemia   . Impaired fasting glucose   . Knee pain    bilateral  . Left main coronary artery disease 03/18/2017   Cardiac Cath: 55% dLM, tandem p-mLAD 75% --> referred for CABG.  . Meniere's disease   . Meniere's vertigo   . Mild carotid artery disease ([place])   . Ocular migraine   . 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 Cyndia Bent @ [facility]):  LIMA-LAD, SVG-D2, SVG-OM, SVG-PDA (endoscopic SVG harvesting from right leg).  . Shoulder pain   . Wears hearing aid     Past Surgical History:  Procedure Laterality Date  . Cardiac Stress Test  2008   Normal  . CARPAL TUNNEL RELEASE Left 1998  . CARPAL TUNNEL RELEASE Right 2008  . CORONARY ARTERY BYPASS GRAFT N/A 04/04/2017   Procedure: CORONARY ARTERY BYPASS GRAFTING x 4  LIMA-LAD, SVG-D2, SVG-OM, SVG-PDA (endoscopic SVG harvesting from right leg);  Surgeon: Gaye Pollack, MD;  Location: [facility];  Service: Open Heart Surgery;  Laterality: N/A;  . HERNIA REPAIR Left    inguinal  . HIATAL HERNIA REPAIR     no surgery per pt  . KNEE CARTILAGE SURGERY Bilateral   . LEFT HEART CATH AND CORONARY ANGIOGRAPHY N/A 03/18/2017   Procedure: Left Heart Cath and Coronary Angiography;  Surgeon: Leonie Man, MD;  Location: [place] [facility];  Service: Cardiovascular:  55% dLM, p-mLAD 75% - mLAD 75%.  EF 55-65%. Mod non-obstructive RCA disease --> referred for CABG  . MEDIAL PARTIAL KNEE REPLACEMENT Right 2010   Partial right knee replacement  . NM MYOVIEW LTD  01/2017   INTERMEDIATE RISK: Exercise tolerance was good at 10 minutes, however, fatigue and dyspnea were reporte-d -> hypertensive response to exercise.  1mm Horizontal ST depressions noted during stress in the II, III, aVF, V6, V5 and V4 leads c/w ischemia.   Small siize, moderate severity perfusion defect  mid to distal anterior wall perfusion  defect suggestive of ischemia..   . ROTATOR CUFF REPAIR Left 2008  . SHOULDER ARTHROSCOPY W/ ROTATOR CUFF REPAIR Right 06/15/2016  . TEE WITHOUT CARDIOVERSION N/A 04/04/2017   Procedure: TRANSESOPHAGEAL ECHOCARDIOGRAM (TEE);  Surgeon: Gaye Pollack, MD;  Location: [facility];  Service: Open Heart Surgery;  Laterality: N/A;  . TONSILLECTOMY      Current Meds  Medication Sig  . aspirin 325 MG EC tablet Take 1 tablet (325 mg total) by mouth daily.  . Calcium Citrate-Vitamin D (CITRACAL MAXIMUM) 315-250 MG-UNIT TABS Take 1 tablet by mouth 2 (two) times daily.   . cholecalciferol (VITAMIN D) 1000 UNITS tablet Take 1,000 Units by mouth 2 (two) times daily.   . Cyanocobalamin (VITAMIN B-12 PO) Take 1 tablet by mouth every evening.   . diphenhydramine-acetaminophen (TYLENOL PM) 25-500 MG TABS tablet Take 0.5 tablets by mouth at bedtime as needed (for sleep.).  Marland Kitchen ezetimibe (ZETIA) 10 MG tablet Take 10 mg by mouth every evening.   Marland Kitchen  metoprolol tartrate (LOPRESSOR) 25 MG tablet Take 0.5 tablets (12.5 mg total) by mouth 2 (two) times daily.  . Multiple Vitamins-Minerals (CENTRUM SILVER PO) Take 1 tablet by mouth daily.  . pravastatin (PRAVACHOL) 20 MG tablet Take 1 tablet (20 mg total) by mouth daily at 6 PM.  . ranitidine (ZANTAC) 150 MG tablet Take 150 mg by mouth 2 (two) times daily.    Allergies  Allergen Reactions  . Crestor [Rosuvastatin Calcium] Other (See Comments)    Muscle aches     Social History   Socioeconomic History  . Marital status: Married    Spouse name: None  . Number of children: 2  . Years of education: None  . Highest education level: None  Social Needs  . Financial resource strain: None  . Food insecurity - worry: None  . Food insecurity - inability: None  . Transportation needs - medical: None  . Transportation needs - non-medical: None  Occupational History  . Occupation: retired  Tobacco Use  . Smoking status: Never Smoker  . Smokeless tobacco: Never Used    Substance and Sexual Activity  . Alcohol use: Yes    Alcohol/week: 9.6 oz    Types: 14 Glasses of wine, 2 Shots of liquor per week  . Drug use: No  . Sexual activity: Yes  Other Topics Concern  . None  Social History Narrative   Jordan [name] is a very pleasant married gentleman with 2 children [name] and McBaine) each with 3 children/total 6 grandchildren. He is currently retired now from his original job in [employer], however he still serves on [employer] at [employer] and recently stepped down from [employer] at [employer].   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    For the last several months he has been off caffeine and wine because of GI upset symptoms and palpitations. Also trying to lose weight.     He used to drink maybe 2 glasses of wine at night, and he is subsequently all quit alcohol.   He is relatively active with exercise but not with routine pattern for standard routine. He does push AND other calisthenics as well as kayaking and bike riding.     family history includes Cancer in his mother; Coronary artery disease in his father; Heart attack in his father; Heart disease in his father; Heart failure in his mother; Hyperlipidemia in his brother, father, and son; Hypertension in his brother.  Wt Readings from Last 3 Encounters:  07/09/17 181 lb 12.8 oz (82.5 kg)  07/08/17 180 lb (81.6 kg)  05/02/17 178 lb 12.7 oz (81.1 kg)    PHYSICAL EXAM BP 102/64 (BP Location: Left Arm, Patient Position: Sitting, Cuff Size: Normal)   Pulse 64   Ht 5' 7.25" (1.708 m)   Wt 180 lb (81.6 kg)   BMI 27.98 kg/m  Physical Exam  Constitutional: He is oriented to person, place, and time. He appears well-developed and well-nourished. No distress.  HENT:  Head: Normocephalic and atraumatic.  Neck: No hepatojugular reflux and no JVD present. Carotid bruit is not present.  Cardiovascular: Normal rate, regular rhythm and intact distal  pulses. Exam reveals no gallop and no friction rub.  No murmur heard. Pulmonary/Chest: Effort normal and breath sounds normal. No respiratory distress. He has no wheezes. He has no rales.  Well-healed sternotomy scar  Abdominal: Soft. Bowel sounds are normal. He exhibits no distension. There is no tenderness. There  is no rebound.  Musculoskeletal: Normal range of motion. He exhibits no edema.  Neurological: He is alert and oriented to person, place, and time.  Skin: Skin is warm and dry. No rash noted. No erythema.  Psychiatric: His behavior is normal. Judgment and thought content normal.  Nursing note and vitals reviewed.   Adult ECG Report N/a  Other studies Reviewed: Additional studies/ records that were reviewed today include:  Recent Labs:  Recently checked by PCP   ASSESSMENT / PLAN: Jordan [name] is doing very well post CABG.  He is pretty much back to full activity level.  He is doing high intensity training in cardiac rehab.  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  I just recommended that he does not do heavy physical labor swinging his arms/chopping wood with an ax that may cause discomfort in the sternal wound area.  Otherwise he should be free to go back to full activity level. His blood pressure is well controlled with normal heart rate.  Continued lipid management by PCP.  Problem List Items Addressed This Visit    Dyspnea on exertion    This was his main presenting symptoms that led to a stress test eventually CABG.  He is not having any further symptoms.      Essential hypertension - Primary (Chronic)    Truthfully does not have hypertension with borderline hypotension on low-dose metoprolol.      Hyperlipidemia with target low density lipoprotein (LDL) cholesterol less than 70 mg/dL (Chronic)    I have not seen recent labs.  But his goal should be LDL less than 70.  This is been monitored by his PCP.  He is on combination Zetia plus Pravachol.  Apparently he  had memory issues with Crestor Labs currently not available.      Left main coronary artery disease (Chronic)    Now status post CABG for left main and LAD disease.  Other anginal symptoms.    He is on low-dose aspirin and beta-blocker along with Zetia and pravastatin.      S/P CABG x 4    At this point, he has been cleared for full activity.  He asked about restrictions, there are no cardiac restrictions at this time I just suggested that he is careful chopping wood or doing rowing activities at this time.  By next summer he should be fine.         Current medicines are reviewed at length with the patient today. (+/- concerns) none The following changes have been made:None  Patient Instructions  NO CHANGES WITH CURRENT MEDICATIONS    Your physician wants you to follow-up in MAY 2019 [place].You will receive a reminder letter in the mail two months in advance. If you don't receive a letter, please call our office to schedule the follow-up appointment.    If you need a refill on your cardiac medications before your next appointment, please call your pharmacy.    Studies Ordered:   No orders of the defined types were placed in this encounter.     Glenetta Hew, M.D., M.S. Interventional Cardiologist   Pager # [phone] Phone # [phone] [address] [place], [place] [address]

## 2017-07-06 NOTE — Progress Notes (Deleted)
PCP: Crist Infante, MD  Clinic Note: No chief complaint on file.   HPI: Jordan [name] is a 76 y.o. male with a PMH below who presents today for 83-month follow-up status post CABG in August 2018. Jordan [name]   was initially seen on January 28, 2017 for the evaluation of progressively worsening exertional dyspnea/atypical angina at the request of Crist Infante, MD.  Initial evaluation: Myoview stress test 02/12/2017--> read as INTERMEDIATE RISK with an anterior perfusion defect and grossly abnormal EKG portion.    Cardiac Catheterization 03/18/2017: 55% dLM, p-mLAD 75% - mLAD 75%.  EF 55-65%. Mod non-obstructive RCA disease --> OP CVTS c/s (Dr. Cyndia Bent March 27, 2017)  CABG x4 April 04, 2017: LIMA-LAD, SVG-D2, SVG-OM, SVG-PDA (endoscopic SVG harvesting from right leg).  Intraop TEE - EF 55-60%, mild MR  Discharged home on POD #4 (2/45) - NO complications.  Jordan [name] was last seen on ***  Recent Hospitalizations: ***  Studies Personally Reviewed - (if available, images/films reviewed: From Epic Chart or Care Everywhere)  ***  Interval History: ***   No chest pain or shortness of breath with rest or exertion. No PND, orthopnea or edema. No palpitations, lightheadedness, dizziness, weakness or syncope/near syncope. No TIA/amaurosis fugax symptoms. No melena, hematochezia, hematuria, or epstaxis. No claudication.  ROS: A comprehensive was performed. ROS   I have reviewed and (if needed) personally updated the patient's problem list, medications, allergies, past medical and surgical history, social and family history.   Past Medical History:  Diagnosis Date  . DDD (degenerative disc disease)   . Dyspnea   . ED (erectile dysfunction)   . GERD (gastroesophageal reflux disease)   . History of hiatal hernia   . History of kidney stones   . HTN (hypertension)   . Hyperlipidemia   . Impaired fasting glucose   . Knee pain    bilateral  . Left main  coronary artery disease 03/18/2017   Cardiac Cath: 55% dLM, tandem p-mLAD 75% --> referred for CABG.  . Meniere's disease   . Meniere's vertigo   . Mild carotid artery disease ([place])   . Ocular migraine   . 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 Cyndia Bent @ [facility]):  LIMA-LAD, SVG-D2, SVG-OM, SVG-PDA (endoscopic SVG harvesting from right leg).  . Shoulder pain   . Wears hearing aid    Past Surgical History:  Procedure Laterality Date  . Cardiac Stress Test  2008   Normal  . CARPAL TUNNEL RELEASE Left 1998  . CARPAL TUNNEL RELEASE Right 2008  . CORONARY ARTERY BYPASS GRAFTING times  four  with left internal mammary artery and right leg saphenous vein N/A 04/04/2017   Performed by Gaye Pollack, MD at [place]  . HERNIA REPAIR Left    inguinal  . HIATAL HERNIA REPAIR     no surgery per pt  . KNEE CARTILAGE SURGERY Bilateral   . Left Heart Cath and Coronary Angiography N/A 03/18/2017   Performed by Leonie Man, MD at [facility]  . MEDIAL PARTIAL KNEE REPLACEMENT Right 2010   Partial right knee replacement  . ROTATOR CUFF REPAIR Left 2008  . SHOULDER ARTHROSCOPY W/ ROTATOR CUFF REPAIR Right 06/15/2016  . TONSILLECTOMY    . TRANSESOPHAGEAL ECHOCARDIOGRAM (TEE) N/A 04/04/2017   Performed by Gaye Pollack, MD at [place]        No outpatient medications have been marked as taking  for the 07/08/17 encounter (Appointment) with Leonie Man, MD.    Allergies  Allergen Reactions  . Crestor [Rosuvastatin Calcium] Other (See Comments)    Muscle aches     Social History   Socioeconomic History  . Marital status: Married    Spouse name: None  . Number of children: 2  . Years of education: None  . Highest education level: None  Social Needs  . Financial resource strain: None  . Food insecurity - worry: None  . Food insecurity - inability: None  . Transportation needs - medical: None  . Transportation needs - non-medical: None    Occupational History  . Occupation: retired  Tobacco Use  . Smoking status: Never Smoker  . Smokeless tobacco: Never Used  Substance and Sexual Activity  . Alcohol use: Yes    Alcohol/week: 9.6 oz    Types: 14 Glasses of wine, 2 Shots of liquor per week  . Drug use: No  . Sexual activity: Yes  Other Topics Concern  . None  Social History Narrative   [name] is a very pleasant married gentleman with 2 children [name] and [place]) each with 3 children/total 6 grandchildren. He is currently retired now from his original job in [employer], however he still serves on [employer] at [employer] and recently stepped down from [employer] at [employer].   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    For the last several months he has been off caffeine and wine because of GI upset symptoms and palpitations. Also trying to lose weight.     He used to drink maybe 2 glasses of wine at night, and he is subsequently all quit alcohol.   He is relatively active with exercise but not with routine pattern for standard routine. He does push AND other calisthenics as well as kayaking and bike riding.     family history includes Cancer in his mother; Coronary artery disease in his father; Heart attack in his father; Heart disease in his father; Heart failure in his mother; Hyperlipidemia in his brother, father, and son; Hypertension in his brother.  Wt Readings from Last 3 Encounters:  05/02/17 178 lb 12.7 oz (81.1 kg)  04/24/17 176 lb (79.8 kg)  04/24/17 176 lb 9.6 oz (80.1 kg)    PHYSICAL EXAM There were no vitals taken for this visit. Physical Exam   Adult ECG Report  Rate: *** ;  Rhythm: {rhythm:17366};   Narrative Interpretation: ***   Other studies Reviewed: Additional studies/ records that were reviewed today include:  Recent Labs:  ***     ASSESSMENT / PLAN: Problem List Items Addressed This Visit    None      Current  medicines are reviewed at length with the patient today. (+/- concerns) *** The following changes have been made: ***  There are no Patient Instructions on file for this visit.  Studies Ordered:   No orders of the defined types were placed in this encounter.     Glenetta Hew, M.D., M.S. Interventional Cardiologist   Pager # [phone] Phone # [phone] [address]. [place], [place] [address]

## 2017-07-08 ENCOUNTER — Encounter (HOSPITAL_COMMUNITY)
Admission: RE | Admit: 2017-07-08 | Discharge: 2017-07-08 | Disposition: A | Discharge status: Home / Self Care | Payer: Medicare Other | Source: Ambulatory Visit | Attending: Cardiology | Admitting: Cardiology

## 2017-07-08 ENCOUNTER — Encounter: Payer: Self-pay | Admitting: Cardiology

## 2017-07-08 ENCOUNTER — Ambulatory Visit (INDEPENDENT_AMBULATORY_CARE_PROVIDER_SITE_OTHER): Payer: Medicare Other | Admitting: Cardiology

## 2017-07-08 VITALS — BP 102/64 | HR 64 | Ht 67.25 in | Wt 180.0 lb

## 2017-07-08 DIAGNOSIS — Z951 Presence of aortocoronary bypass graft: Secondary | ICD-10-CM

## 2017-07-08 DIAGNOSIS — I10 Essential (primary) hypertension: Secondary | ICD-10-CM

## 2017-07-08 DIAGNOSIS — I2510 Atherosclerotic heart disease of native coronary artery without angina pectoris: Secondary | ICD-10-CM

## 2017-07-08 DIAGNOSIS — E785 Hyperlipidemia, unspecified: Secondary | ICD-10-CM | POA: Diagnosis not present

## 2017-07-08 DIAGNOSIS — H52203 Unspecified astigmatism, bilateral: Secondary | ICD-10-CM | POA: Diagnosis not present

## 2017-07-08 DIAGNOSIS — R0609 Other forms of dyspnea: Secondary | ICD-10-CM

## 2017-07-08 DIAGNOSIS — H2513 Age-related nuclear cataract, bilateral: Secondary | ICD-10-CM | POA: Diagnosis not present

## 2017-07-08 NOTE — Assessment & Plan Note (Signed)
I have not seen recent labs.  But his goal should be LDL less than 70.  This is been monitored by his PCP.  He is on combination Zetia plus Pravachol.  Apparently he had memory issues with Crestor Labs currently not available.

## 2017-07-08 NOTE — Assessment & Plan Note (Signed)
This was his main presenting symptoms that led to a stress test eventually CABG.  He is not having any further symptoms.

## 2017-07-08 NOTE — Patient Instructions (Signed)
NO CHANGES WITH CURRENT MEDICATIONS    Your physician wants you to follow-up in MAY 2019 [place].You will receive a reminder letter in the mail two months in advance. If you don't receive a letter, please call our office to schedule the follow-up appointment.    If you need a refill on your cardiac medications before your next appointment, please call your pharmacy.

## 2017-07-08 NOTE — Assessment & Plan Note (Signed)
At this point, he has been cleared for full activity.  He asked about restrictions, there are no cardiac restrictions at this time I just suggested that he is careful chopping wood or doing rowing activities at this time.  By next summer he should be fine.

## 2017-07-08 NOTE — Assessment & Plan Note (Signed)
Now status post CABG for left main and LAD disease.  Other anginal symptoms.    He is on low-dose aspirin and beta-blocker along with Zetia and pravastatin.

## 2017-07-08 NOTE — Assessment & Plan Note (Signed)
Truthfully does not have hypertension with borderline hypotension on low-dose metoprolol.

## 2017-07-09 ENCOUNTER — Encounter: Payer: Self-pay | Admitting: Internal Medicine

## 2017-07-09 ENCOUNTER — Encounter: Payer: Self-pay | Admitting: Cardiology

## 2017-07-09 ENCOUNTER — Ambulatory Visit (INDEPENDENT_AMBULATORY_CARE_PROVIDER_SITE_OTHER): Payer: Medicare Other | Admitting: Internal Medicine

## 2017-07-09 VITALS — BP 121/82 | HR 72 | Ht 67.0 in | Wt 181.8 lb

## 2017-07-09 DIAGNOSIS — R0609 Other forms of dyspnea: Secondary | ICD-10-CM | POA: Diagnosis not present

## 2017-07-09 DIAGNOSIS — R0602 Shortness of breath: Secondary | ICD-10-CM

## 2017-07-09 DIAGNOSIS — I2510 Atherosclerotic heart disease of native coronary artery without angina pectoris: Secondary | ICD-10-CM | POA: Diagnosis not present

## 2017-07-09 DIAGNOSIS — R590 Localized enlarged lymph nodes: Secondary | ICD-10-CM

## 2017-07-09 NOTE — Progress Notes (Signed)
Subjective:     Patient ID: Jordan [name], male   DOB: 1940/09/16, 76 y.o.   MRN: [id number]    HPI   03/19/2017 Follow up : DOE  Patient returns for a two-week follow-up. Patient was seen last visit for pulmonary consult. Patient is extremely active and does a lot of exercise. He was noticing that he had some shortness of breath when he went up. He'll. Also he had a CT chest in May 2018 that show some bilateral lobe atelectasis. Patient was set up for a high resolution CT chest done on 03/05/2017 that showed no evidence of interstitial lung disease. There was bibasilar parenchymal scarring. Patient was set up for a pulmonary function test that was done today that showed essentially normal lung function with an FEV1 of around 79%, ratio 78, no significant bronchodilator response, FVC 73%,  mild diffusing defect with a DLCO at 71% Patient has also been undergoing a cardiac workup. Patient had abnormal stress Myoview.Marland Kitchen He underwent a left heart catheter on 03/18/2017 that showed left main lesion 55% stenosed, proximal LAD to mid LAD, 75% stenosed, moderate RCA disease. He has been recommended for surgical consult for possible CABG.  Patient denies any chest pain, orthopnea, PND, cough, wheezing, or increased leg swelling.  TEST  02/2017 Walking desaturation test on an 185 feet 3 laps on room air in our office. He walked extremely fast. Resting pulse ox was 94%. Final pulse ox was 92%. Resting heart rate was 67/m. Final heart rate was 88/m  02/2017 Exhaled nitric oxide test in office today : feno 28ppb and normal- intermediate  He underwent CT scan of the chest without contrast 01/02/2017:  this is not a high-resolution CT chest showed bilateral lower lobe atelectasis with some groundglass opacities. There is a subcarinal node that is 1.6 cm in size.   OV 07/09/2017  Chief Complaint  Patient presents with  . Follow-up    heart bypass surgery 3 months ago, no complaints, no sob    Follow-up dyspnea: He was initially referred in spring/summer 2018 for dyspnea on exertion.  We did not find any pulmonary cause.  He underwent a bypass surgery 3 months ago and after this the dyspnea on exertion resolved.  He is currently cleared from cardiac surgery follow-up.  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  He feels his dyspnea problem has resolved.  He continues to do push-ups without any problems  Positive history of subcarinal lymphadenopathy 1.6 cm: This was diagnosed on the CT scan of the chest without contrast in May 2018.  Clinically it was felt to be reactive.  He did not have any lung nodules.  Nevertheless the sizes significant because it is greater than 1 cm.  He is open to having this followed up at this point in time he does not have shortness of breath cough or wheeze    has a past medical history of DDD (degenerative disc disease), Dyspnea, ED (erectile dysfunction), GERD (gastroesophageal reflux disease), History of hiatal hernia, History of kidney stones, HTN (hypertension), Hyperlipidemia, Impaired fasting glucose, Knee pain, Left main coronary artery disease (03/18/2017), Meniere's disease, Meniere's vertigo, Mild carotid artery disease ([place]), Ocular migraine, S/P CABG x 4 (04/04/2017), Shoulder pain, and Wears hearing aid.   reports that  has never smoked. he has never used smokeless tobacco.  Past Surgical History:  Procedure Laterality Date  . Cardiac Stress Test  2008   Normal  . CARPAL TUNNEL RELEASE  Left 1998  . CARPAL TUNNEL RELEASE Right 2008  . CORONARY ARTERY BYPASS GRAFTING times  four  with left internal mammary artery and right leg saphenous vein N/A 04/04/2017   Performed by Gaye Pollack, MD at [place]  . HERNIA REPAIR Left    inguinal  . HIATAL HERNIA REPAIR     no surgery per pt  . KNEE CARTILAGE SURGERY Bilateral   . Left Heart Cath and Coronary Angiography N/A 03/18/2017   Performed by Leonie Man, MD at [place]  . MEDIAL PARTIAL KNEE REPLACEMENT Right 2010   Partial right knee replacement  . NM MYOVIEW LTD  01/2017   INTERMEDIATE RISK: Exercise tolerance was good at 10 minutes, however, fatigue and dyspnea were reporte-d -> hypertensive response to exercise.  77mm Horizontal ST depressions noted during stress in the II, III, aVF, V6, V5 and V4 leads c/w ischemia.   Small siize, moderate severity perfusion defect  mid to distal anterior wall perfusion defect suggestive of ischemia..   . ROTATOR CUFF REPAIR Left 2008  . SHOULDER ARTHROSCOPY W/ ROTATOR CUFF REPAIR Right 06/15/2016  . TONSILLECTOMY    . TRANSESOPHAGEAL ECHOCARDIOGRAM (TEE) N/A 04/04/2017   Performed by Gaye Pollack, MD at [facility]    Allergies  Allergen Reactions  . Crestor [Rosuvastatin Calcium] Other (See Comments)    Muscle aches     Immunization History  Administered Date(s) Administered  . Influenza, High Dose Seasonal PF 05/20/2016    Family History  Problem Relation Age of Onset  . Heart failure Mother   . Cancer Mother        type unknown  . Coronary artery disease Father        CABG  . Heart attack Father        Long-term smoker.  Marland Kitchen Heart disease Father   . Hyperlipidemia Father   . Hyperlipidemia Brother   . Hypertension Brother   . Hyperlipidemia Son      Current Outpatient Medications:  .  aspirin 325 MG EC tablet, Take 1 tablet (325 mg total) by mouth daily., Disp: 30 tablet, Rfl: 0 .  Calcium Citrate-Vitamin D (CITRACAL MAXIMUM) 315-250 MG-UNIT TABS, Take 1 tablet by mouth 2 (two) times daily. , Disp: , Rfl:  .  cholecalciferol (VITAMIN D) 1000 UNITS tablet, Take 1,000 Units by mouth 2 (two) times daily. , Disp: , Rfl:  .  Cyanocobalamin (VITAMIN B-12 PO), Take 1 tablet by mouth every evening. , Disp: , Rfl:  .  diphenhydramine-acetaminophen (TYLENOL PM) 25-500 MG TABS tablet, Take 0.5 tablets by mouth at bedtime as needed (for sleep.)., Disp: , Rfl:  .  ezetimibe (ZETIA) 10 MG tablet,  Take 10 mg by mouth every evening. , Disp: , Rfl:  .  metoprolol tartrate (LOPRESSOR) 25 MG tablet, Take 0.5 tablets (12.5 mg total) by mouth 2 (two) times daily., Disp: 30 tablet, Rfl: 1 .  Multiple Vitamins-Minerals (CENTRUM SILVER PO), Take 1 tablet by mouth daily., Disp: , Rfl:  .  pravastatin (PRAVACHOL) 20 MG tablet, Take 1 tablet (20 mg total) by mouth daily at 6 PM., Disp: 30 tablet, Rfl: 1 .  ranitidine (ZANTAC) 150 MG tablet, Take 150 mg by mouth 2 (two) times daily., Disp: , Rfl:  .  furosemide (LASIX) 40 MG tablet, Take 1 tablet (40 mg total) by mouth daily as needed. (Patient not taking: Reported on 07/08/2017), Disp: , Rfl:    Review of Systems  Objective:   Physical Exam  Constitutional: He is oriented to person, place, and time. He appears well-developed and well-nourished. No distress.  HENT:  Head: Normocephalic and atraumatic.  Right Ear: External ear normal.  Left Ear: External ear normal.  Mouth/Throat: Oropharynx is clear and moist. No oropharyngeal exudate.  Eyes: Conjunctivae and EOM are normal. Pupils are equal, round, and reactive to light. Right eye exhibits no discharge. Left eye exhibits no discharge. No scleral icterus.  Neck: Normal range of motion. Neck supple. No JVD present. No tracheal deviation present. No thyromegaly present.  Cardiovascular: Normal rate, regular rhythm and intact distal pulses. Exam reveals no gallop and no friction rub.  No murmur heard. Pulmonary/Chest: Effort normal and breath sounds normal. No respiratory distress. He has no wheezes. He has no rales. He exhibits no tenderness.  Abdominal: Soft. Bowel sounds are normal. He exhibits no distension and no mass. There is no tenderness. There is no rebound and no guarding.  Musculoskeletal: Normal range of motion. He exhibits no edema or tenderness.  Lymphadenopathy:    He has no cervical adenopathy.  Neurological: He is alert and oriented to person, place, and time. He has normal  reflexes. No cranial nerve deficit. Coordination normal.  Skin: Skin is warm and dry. No rash noted. He is not diaphoretic. No erythema. No pallor.  Psychiatric: He has a normal mood and affect. His behavior is normal. Judgment and thought content normal.  Nursing note and vitals reviewed.  Vitals:   07/09/17 0941  BP: 121/82  Pulse: 72  SpO2: 93%  Weight: 181 lb 12.8 oz (82.5 kg)  Height: 5\' 7"  (1.702 m)    Estimated body mass index is 28.47 kg/m as calculated from the following:   Height as of this encounter: 5\' 7"  (1.702 m).   Weight as of this encounter: 181 lb 12.8 oz (82.5 kg).     Assessment:       ICD-10-CM   1. Mediastinal adenopathy R59.0   2. Dyspnea on exertion R06.09        Plan:     Mediastinal adenopathy - seen may 2018 CT  - do repeat ct chest with contrast may-July 2019  Dyspnea on exertion - glad is improved after bypass - no pulmonary issue found  - no  Further pulmonary followup  Followup  - will call with results of CT in summer 2019 and then decide followup if needed    Dr. Brand Males, M.D., [facility]C.P Pulmonary and Critical Care Medicine Staff Physician [facility] Pager: [phone], If no answer or between  15:00h - 7:00h: call [phone]  07/09/2017 9:58 AM

## 2017-07-09 NOTE — Patient Instructions (Signed)
Mediastinal adenopathy - seen may 2018 CT  - do repeat ct chest with contrast may-July 2019  Dyspnea on exertion - glad is improved after bypass - no pulmonary issue found  - no  Further pulmonary followup  Followup  - will call with results of CT in summer 2019 and then decide followup if needed

## 2017-07-10 ENCOUNTER — Encounter (HOSPITAL_COMMUNITY): Payer: Medicare Other

## 2017-07-15 ENCOUNTER — Encounter (HOSPITAL_COMMUNITY)
Admission: RE | Admit: 2017-07-15 | Discharge: 2017-07-15 | Disposition: A | Discharge status: Home / Self Care | Payer: Medicare Other | Source: Ambulatory Visit | Attending: Cardiology | Admitting: Cardiology

## 2017-07-15 DIAGNOSIS — Z951 Presence of aortocoronary bypass graft: Secondary | ICD-10-CM | POA: Diagnosis not present

## 2017-07-17 ENCOUNTER — Encounter (HOSPITAL_COMMUNITY)
Admission: RE | Admit: 2017-07-17 | Discharge: 2017-07-17 | Disposition: A | Discharge status: Home / Self Care | Payer: Medicare Other | Source: Ambulatory Visit | Attending: Cardiology | Admitting: Cardiology

## 2017-07-17 DIAGNOSIS — Z951 Presence of aortocoronary bypass graft: Secondary | ICD-10-CM | POA: Diagnosis not present

## 2017-07-19 ENCOUNTER — Encounter (HOSPITAL_COMMUNITY): Payer: Medicare Other

## 2017-07-19 NOTE — Progress Notes (Signed)
Cardiac Individual Treatment Plan  Patient Details  Name: Jordan [name] MRN: [id number] Date of Birth: 1940-12-06 Referring Provider:     CARDIAC REHAB PHASE II ORIENTATION from 05/02/2017 in [place]  Referring Provider  Glenetta Hew MD      Initial Encounter Date:    CARDIAC REHAB PHASE II ORIENTATION from 05/02/2017 in [place]  Date  05/02/17  Referring Provider  Glenetta Hew MD      Visit Diagnosis: S/P CABG x 4  Patient's Home Medications on Admission:  Current Outpatient Medications:  .  aspirin 325 MG EC tablet, Take 1 tablet (325 mg total) by mouth daily., Disp: 30 tablet, Rfl: 0 .  Calcium Citrate-Vitamin D (CITRACAL MAXIMUM) 315-250 MG-UNIT TABS, Take 1 tablet by mouth 2 (two) times daily. , Disp: , Rfl:  .  cholecalciferol (VITAMIN D) 1000 UNITS tablet, Take 1,000 Units by mouth 2 (two) times daily. , Disp: , Rfl:  .  Cyanocobalamin (VITAMIN B-12 PO), Take 1 tablet by mouth every evening. , Disp: , Rfl:  .  diphenhydramine-acetaminophen (TYLENOL PM) 25-500 MG TABS tablet, Take 0.5 tablets by mouth at bedtime as needed (for sleep.)., Disp: , Rfl:  .  ezetimibe (ZETIA) 10 MG tablet, Take 10 mg by mouth every evening. , Disp: , Rfl:  .  furosemide (LASIX) 40 MG tablet, Take 1 tablet (40 mg total) by mouth daily as needed. (Patient not taking: Reported on 07/08/2017), Disp: , Rfl:  .  metoprolol tartrate (LOPRESSOR) 25 MG tablet, Take 0.5 tablets (12.5 mg total) by mouth 2 (two) times daily., Disp: 30 tablet, Rfl: 1 .  Multiple Vitamins-Minerals (CENTRUM SILVER PO), Take 1 tablet by mouth daily., Disp: , Rfl:  .  pravastatin (PRAVACHOL) 20 MG tablet, Take 1 tablet (20 mg total) by mouth daily at 6 PM., Disp: 30 tablet, Rfl: 1 .  ranitidine (ZANTAC) 150 MG tablet, Take 150 mg by mouth 2 (two) times daily., Disp: , Rfl:   Past Medical History: Past Medical History:  Diagnosis Date  . DDD  (degenerative disc disease)   . Dyspnea   . ED (erectile dysfunction)   . GERD (gastroesophageal reflux disease)   . History of hiatal hernia   . History of kidney stones   . HTN (hypertension)   . Hyperlipidemia   . Impaired fasting glucose   . Knee pain    bilateral  . Left main coronary artery disease 03/18/2017   Cardiac Cath: 55% dLM, tandem p-mLAD 75% --> referred for CABG.  . Meniere's disease   . Meniere's vertigo   . Mild carotid artery disease ([place])   . Ocular migraine   . 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 Cyndia Bent @ [facility]):  LIMA-LAD, SVG-D2, SVG-OM, SVG-PDA (endoscopic SVG harvesting from right leg).  . Shoulder pain   . Wears hearing aid     Tobacco Use: Social History   Tobacco Use  Smoking Status Never Smoker  Smokeless Tobacco Never Used    Labs: Recent Review Flowsheet Data    Labs for ITP Cardiac and Pulmonary Rehab Latest Ref Rng & Units 04/04/2017 04/04/2017 04/04/2017 04/04/2017 04/05/2017   Hemoglobin A1c 4.8 - 5.6 % - - - - -   PHART 7.350 - 7.450 7.417 7.360 - 7.355 -   PCO2ART 32.0 - 48.0 mmHg 39.3 44.9 - 42.9 -   HCO3 20.0 - 28.0 mmol/L 25.5 25.2 - 23.7 -   TCO2 0 -  100 mmol/L 27 27 24 25 22    ACIDBASEDEF 0.0 - 2.0 mmol/L - - - 2.0 -   O2SAT % 95.0 97.0 - 97.0 -      Capillary Blood Glucose: Lab Results  Component Value Date   GLUCAP 139 (H) 04/05/2017   GLUCAP 101 (H) 04/05/2017   GLUCAP 102 (H) 04/05/2017   GLUCAP 96 04/04/2017   GLUCAP 96 04/04/2017     Exercise Target Goals:    Exercise Program Goal: Individual exercise prescription set with THRR, safety & activity barriers. Participant demonstrates ability to understand and report RPE using BORG scale, to self-measure pulse accurately, and to acknowledge the importance of the exercise prescription.  Exercise Prescription Goal: Starting with aerobic activity 30 plus minutes a day, 3 days per week for initial exercise prescription. Provide home exercise prescription and  guidelines that participant acknowledges understanding prior to discharge.  Activity Barriers & Risk Stratification: Activity Barriers & Cardiac Risk Stratification - 05/02/17 0828      Activity Barriers & Cardiac Risk Stratification   Activity Barriers  Neck/Spine Problems;Back Problems;Other (comment)    Comments  R RTC surgery Oct. 2017; partial R knee replacement 6 yrs ago; B knee scopes; compressed L4-L5    Cardiac Risk Stratification  High       6 Minute Walk: 6 Minute Walk    Row Name 05/02/17 1133         6 Minute Walk   Phase  Initial     Distance  1838 feet     Walk Time  6 minutes     # of Rest Breaks  0     MPH  3.48     METS  3.3     RPE  11     Symptoms  No     Resting HR  65 bpm     Resting BP  124/60     Resting Oxygen Saturation   95 %     Exercise Oxygen Saturation  during 6 min walk  95 %     Max Ex. HR  82 bpm     Max Ex. BP  124/60     2 Minute Post BP  118/60        Oxygen Initial Assessment:   Oxygen Re-Evaluation:   Oxygen Discharge (Final Oxygen Re-Evaluation):   Initial Exercise Prescription: Initial Exercise Prescription - 05/02/17 1100      Date of Initial Exercise RX and Referring Provider   Date  05/02/17    Referring Provider  Glenetta Hew MD      Bike   Level  0.8    Minutes  10    METs  2.89      NuStep   Level  3    SPM  80    Minutes  10    METs  2.5      Track   Laps  11    Minutes  10    METs  2.92      Prescription Details   Frequency (times per week)  3    Duration  Progress to 30 minutes of continuous aerobic without signs/symptoms of physical distress      Intensity   THRR 40-80% of Max Heartrate  58-117    Ratings of Perceived Exertion  11-13    Perceived Dyspnea  0-4      Progression   Progression  Continue to progress workloads to maintain intensity without signs/symptoms of physical distress.  Resistance Training   Training Prescription  Yes    Weight  3lbs    Reps  10-15        Perform Capillary Blood Glucose checks as needed.  Exercise Prescription Changes: Exercise Prescription Changes    Row Name 05/07/17 1600 05/20/17 1600 05/22/17 1100 06/03/17 0800 06/17/17 0800     Response to Exercise   Blood Pressure (Admit)  121/64  120/70  120/80  128/60  122/66   Blood Pressure (Exercise)  120/70  138/82  120/64  140/60  158/78   Blood Pressure (Exit)  120/64  104/60  120/70  120/62  124/72   Heart Rate (Admit)  66 bpm  57 bpm  60 bpm  62 bpm  57 bpm   Heart Rate (Exercise)  81 bpm  82 bpm  89 bpm  90 bpm  84 bpm   Heart Rate (Exit)  68 bpm  57 bpm  60 bpm  62 bpm  67 bpm   Rating of Perceived Exertion (Exercise)  12  15  12  14  13    Symptoms  none  none  none  none  none   Duration  Continue with 30 min of aerobic exercise without signs/symptoms of physical distress.  Continue with 30 min of aerobic exercise without signs/symptoms of physical distress.  Continue with 30 min of aerobic exercise without signs/symptoms of physical distress.  Continue with 30 min of aerobic exercise without signs/symptoms of physical distress.  Continue with 30 min of aerobic exercise without signs/symptoms of physical distress.   Intensity  THRR unchanged  THRR unchanged  THRR unchanged  THRR unchanged  THRR unchanged     Progression   Progression  Continue to progress workloads to maintain intensity without signs/symptoms of physical distress.  Continue to progress workloads to maintain intensity without signs/symptoms of physical distress.  Continue to progress workloads to maintain intensity without signs/symptoms of physical distress.  Continue to progress workloads to maintain intensity without signs/symptoms of physical distress.  Continue to progress workloads to maintain intensity without signs/symptoms of physical distress.   Average METs  3.3  3.8  4.2  4.5  4.3     Resistance Training   Training Prescription  Yes  Yes  Yes  Yes  Yes   Weight  3lbs  3lbs  3lbs  5lbs  5lbs    Reps  10-15  10-15  10-15  10-15  10-15   Time  -  10 Minutes  10 Minutes  10 Minutes  10 Minutes     Interval Training   Interval Training  No  No  No  No  No     Bike   Level  0.8  1  1.5  1.7  1.7   Minutes  10  10  10  10  10    METs  2.86  3.32  4.49  4.93  4.94     NuStep   Level  3  5  5  5  6    SPM  90  90  90  90  90   Minutes  10  10  10  10  10    METs  3.9  5.1  4.6  4.8  4.1     Track   Laps  13  11  15  16  16    Minutes  10  10  10  10  10    METs  3.26  2.91  3.6  3.79  3.79  Home Exercise Plan   Plans to continue exercise at  -  -  Home (comment)  Home (comment)  Home (comment)   Frequency  -  -  Add 3 additional days to program exercise sessions.  Add 3 additional days to program exercise sessions.  Add 3 additional days to program exercise sessions.   Initial Home Exercises Provided  -  -  05/22/17  05/22/17  05/22/17   Row Name 07/08/17 0655 07/17/17 0800           Response to Exercise   Blood Pressure (Admit)  108/75  120/70      Blood Pressure (Exercise)  128/78  140/80      Blood Pressure (Exit)  104/70  124/60      Heart Rate (Admit)  67 bpm  58 bpm      Heart Rate (Exercise)  95 bpm  113 bpm      Heart Rate (Exit)  66 bpm  68 bpm      Rating of Perceived Exertion (Exercise)  17  16      Symptoms  none  none      Duration  Continue with 30 min of aerobic exercise without signs/symptoms of physical distress.  Continue with 30 min of aerobic exercise without signs/symptoms of physical distress.      Intensity  THRR unchanged  THRR unchanged        Progression   Progression  Continue to progress workloads to maintain intensity without signs/symptoms of physical distress.  Continue to progress workloads to maintain intensity without signs/symptoms of physical distress.      Average METs  6.9  7.1        Resistance Training   Training Prescription  Yes  No Relaxation today      Weight  9/7lbs  -      Reps  10-15  -      Time  10 Minutes  -         Interval Training   Interval Training  Yes  Yes      Equipment  Bike;NuStep;Track  Bike;NuStep;Track        Bike   Level  2.2 4.0 HIIT 10.23  2.2 4.0 HIIT 10.32      Minutes  10  10      METs  4.94  6.12        NuStep   Level  6 8 HIIT  6 8 HIIT      SPM  90  90      Minutes  10  10      METs  7.4  8.1        Track   Laps  16  17      Minutes  10  10      METs  3.79  3.95        Home Exercise Plan   Plans to continue exercise at  Home (comment)  Home (comment)      Frequency  Add 3 additional days to program exercise sessions.  Add 3 additional days to program exercise sessions.      Initial Home Exercises Provided  05/22/17  05/22/17         Exercise Comments: Exercise Comments    Row Name 05/08/17 0915 05/22/17 0725 06/03/17 0836 06/17/17 0811 07/08/17 0655   Exercise Comments  Reviewed METs with patient.  Reviewed METs, goals and home exercise prescription with patient.  Reviewed METs with patient.  Reviewed goals with patient.  Reviewed METs and goals with patient. Re-oriented patient to HIIT.   [place] Name 07/17/17 0700           Exercise Comments  Reviewed METs with patient.          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       Expected Outcomes  Achievement of increased cardiorespiratory fitness and enhanced flexibility, muscular endurance and strength shown through measurements of functional capacity and personal statement of participant.       Increase Strength and Stamina  Yes get back to exercise routine of sprints and push ups        Intervention  Provide advice, education, support and counseling about physical activity/exercise needs.;Develop an individualized exercise prescription for aerobic and resistive training based on initial evaluation findings, risk stratification, comorbidities and participant's personal goals.       Expected Outcomes  Achievement of increased cardiorespiratory fitness and enhanced flexibility, muscular endurance and strength shown through measurements of functional capacity and personal statement of participant.       Able to understand and use rate of perceived exertion (RPE) scale  Yes       Intervention  Provide education and explanation on how to use RPE scale       Expected Outcomes  Short Term: Able to use RPE daily in rehab to express subjective intensity level;Long Term:  Able to use RPE to guide intensity level when exercising independently       Knowledge and understanding of Target Heart Rate Range (THRR)  Yes       Intervention  Provide education and explanation of THRR including how the numbers were predicted and where they are located for reference       Expected Outcomes  Short Term: Able to state/look up THRR;Short Term: Able to use daily as guideline for intensity in rehab;Long Term: Able to use THRR to govern intensity when exercising independently       Able to check pulse independently  Yes       Intervention  Provide education and demonstration on how to check pulse in carotid and radial arteries.;Review the importance of being able to check your own pulse for safety during independent exercise       Expected Outcomes  Short Term: Able to explain why pulse checking is important during independent exercise;Long Term: Able to check pulse independently and accurately       Understanding of Exercise Prescription  Yes       Intervention  Provide education, explanation, and written materials on patient's individual exercise prescription       Expected Outcomes  Short Term: Able to explain  program exercise prescription;Long Term: Able to explain home exercise prescription to exercise independently          Exercise Goals Re-Evaluation : Exercise Goals Re-Evaluation    Row Name 05/22/17 1123 06/17/17 0651 07/08/17 0655         Exercise Goal Re-Evaluation   Exercise Goals Review  Increase Physical Activity;Understanding of Exercise Prescription;Able to understand and use  rate of perceived exertion (RPE) scale;Knowledge and understanding of Target Heart Rate Range (THRR)  Increase Physical Activity  Increase Physical Activity     Comments  Reviewed home exercise prescription with patient including THRR, RPE scale, and endpoints for exercise. 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  Patient states he's walking 15 minutes, 5 days/week. Encouraged pt to increase duration gradually to 30 minutes, 5 days/week, and pt is agreeable to this.  Oriented patient to high intensity interval training (HIIT) on 06/28/17. Pt has beeen out x1 week, re-oriented to HIIT. Pt did fairly well. reviewed THRR and increase in THRR for HIIT.     Expected Outcomes  Continue walking in addition to exercise at cardiac rehab to build strength and stamina to do ADLs.  Increase walking duration to 30 minutes, 5 days/week.  Patient plans to continue walking for his home exercise and is also looking into buying a recumbent stepper for home use.         Discharge Exercise Prescription (Final Exercise Prescription Changes): Exercise Prescription Changes - 07/17/17 0800      Response to Exercise   Blood Pressure (Admit)  120/70    Blood Pressure (Exercise)  140/80    Blood Pressure (Exit)  124/60    Heart Rate (Admit)  58 bpm    Heart Rate (Exercise)  113 bpm    Heart Rate (Exit)  68 bpm    Rating of Perceived Exertion (Exercise)  16    Symptoms  none    Duration  Continue with 30 min of aerobic exercise without signs/symptoms of physical distress.     Intensity  THRR unchanged      Progression   Progression  Continue to progress workloads to maintain intensity without signs/symptoms of physical distress.    Average METs  7.1      Resistance Training   Training Prescription  No Relaxation today    Weight  --    Reps  --    Time  --      Interval Training   Interval Training  Yes    Equipment  Bike;NuStep;Track      Bike   Level  2.2 4.0 HIIT 10.32    Minutes  10    METs  6.12      NuStep   Level  6 8 HIIT    SPM  90    Minutes  10    METs  8.1      Track   Laps  17    Minutes  10    METs  3.95      Home Exercise Plan   Plans to continue exercise at  Home (comment)    Frequency  Add 3 additional days to program exercise sessions.    Initial Home Exercises Provided  05/22/17       Nutrition:  Target Goals: Understanding of nutrition guidelines, daily intake of sodium 1500mg , cholesterol 200mg , calories 30% from fat and 7% or less from saturated fats, daily to have 5 or more servings of fruits and vegetables.  Biometrics: Pre Biometrics - 05/02/17 1134      Pre Biometrics   Waist Circumference  38.5 inches    Hip Circumference  39 inches    Waist to Hip Ratio  0.99 %    Triceps Skinfold  14 mm    % Body Fat  26.9 %    Grip Strength  38 kg    Flexibility  121 in    Single Leg Stand  30 seconds        Nutrition Therapy Plan and Nutrition Goals: Nutrition Therapy & Goals - 05/02/17 1217      Nutrition Therapy   Diet  Therapeutic Lifestyle Changes      Personal Nutrition Goals   Nutrition Goal  Wt loss of 1-2 lb/week to a wt loss goal of 6-8 lb at graduation from [place], educate and counsel regarding individualized specific dietary modifications aiming towards targeted core components such as weight, hypertension, lipid management, diabetes, heart failure and other comorbidities.    Expected Outcomes  Short Term Goal: Understand basic principles  of dietary content, such as calories, fat, sodium, cholesterol and nutrients.;Long Term Goal: Adherence to prescribed nutrition plan.       Nutrition Discharge: Nutrition Scores: Nutrition Assessments - 05/02/17 1217      MEDFICTS Scores   Pre Score  53       Nutrition Goals Re-Evaluation:   Nutrition Goals Re-Evaluation:   Nutrition Goals Discharge (Final Nutrition Goals Re-Evaluation):   Psychosocial: Target Goals: Acknowledge presence or absence of significant depression and/or stress, maximize coping skills, provide positive support system. Participant is able to verbalize types and ability to use techniques and skills needed for reducing stress and depression.  Initial Review & Psychosocial Screening: Initial Psych Review & Screening - 05/02/17 1412      Initial Review   Current issues with  None Identified      Family Dynamics   Good Support System?  Yes      Barriers   Psychosocial barriers to participate in program  The patient should benefit from training in stress management and relaxation.      Screening Interventions   Interventions  Encouraged to exercise       Quality of Life Scores: Quality of Life - 05/17/17 1056      Quality of Life Scores   Health/Function Pre  26.17 %    Socioeconomic Pre  28.21 %    Psych/Spiritual Pre  25.29 %    Family Pre  22.8 %    GLOBAL Pre  25.91 % QOL scores reviewed with pt. pt denies current concerns.  pt exhibits overall positive outlook with good coping skills.        PHQ-9: Recent Review Flowsheet Data    Depression screen [facility] 2/9 05/06/2017   Decreased Interest 0   Down, Depressed, Hopeless 0   PHQ - 2 Score 0     Interpretation of Total Score  Total Score Depression Severity:  1-4 = Minimal depression, 5-9 = Mild depression, 10-14 = Moderate depression, 15-19 = Moderately severe depression, 20-27 = Severe depression   Psychosocial Evaluation and Intervention: Psychosocial Evaluation - 05/06/17 0935       Psychosocial Evaluation & Interventions   Interventions  Encouraged to exercise with the program and follow exercise prescription;Stress management education;Relaxation education    Comments  no psychosocial needs identified, no inteventions necessary     Expected Outcomes  pt will exhibit positive outlook with good coping skills.     Continue Psychosocial Services   No Follow up required       Psychosocial Re-Evaluation: Psychosocial Re-Evaluation    [place] Name 05/17/17 1115 05/21/17 0950 06/18/17 0741 07/16/17 1545       Psychosocial Re-Evaluation   Current issues with  None Identified  None Identified  None Identified  None Identified    Comments  no psychosocial needs identified, no interventions necessary   no psychosocial needs identified, no interventions necessary   no psychosocial needs identified, no interventions necessary   no psychosocial needs identified, no interventions necessary     Expected Outcomes  pt will exhibit positive outlook wtih good coping skills.   pt will exhibit positive outlook wtih good coping skills.   pt will exhibit positive outlook wtih good coping skills.   pt will exhibit positive outlook wtih good coping skills.     Interventions  Encouraged to attend Cardiac Rehabilitation for the exercise  Encouraged to attend Cardiac Rehabilitation for the exercise  Encouraged to attend Cardiac Rehabilitation for the exercise  Encouraged to attend Cardiac Rehabilitation for the exercise    Continue Psychosocial Services   No Follow up required  No Follow up required  No Follow up required  No Follow up required       Psychosocial Discharge (Final Psychosocial Re-Evaluation): Psychosocial Re-Evaluation - 07/16/17 1545      Psychosocial Re-Evaluation   Current issues with  None Identified    Comments  no psychosocial needs identified, no interventions necessary     Expected Outcomes  pt will exhibit positive outlook wtih good coping skills.     Interventions   Encouraged to attend Cardiac Rehabilitation for the exercise    Continue Psychosocial Services   No Follow up required       Vocational Rehabilitation: Provide vocational rehab assistance to qualifying candidates.   Vocational Rehab Evaluation & Intervention:   Education: Education Goals: Education classes will be provided on a weekly basis, covering required topics. Participant will state understanding/return demonstration of topics presented.  Learning Barriers/Preferences: Learning Barriers/Preferences - 05/02/17 0827      Learning Barriers/Preferences   Learning Barriers  Sight;Hearing    Learning Preferences  Written Material;Video;Verbal Instruction;Skilled Demonstration;Pictoral       Education Topics: Count Your Pulse:  -Group instruction provided by verbal instruction, demonstration, patient participation and written materials to support subject.  Instructors address importance of being able to find your pulse and how to count your pulse when at home without a heart monitor.  Patients get hands on experience counting their pulse with staff help and individually.   Heart Attack, Angina, and Risk Factor Modification:  -Group instruction provided by verbal instruction, video, and written materials to support subject.  Instructors address signs and symptoms of angina and heart attacks.    Also discuss risk factors for heart disease and how to make changes to improve heart health risk factors.   CARDIAC REHAB PHASE II EXERCISE from 05/08/2017 in [place]  Date  05/08/17  Instruction Review Code  2- meets goals/outcomes      Functional Fitness:  -Group instruction provided by verbal instruction, demonstration, patient participation, and written materials to support subject.  Instructors address safety measures for doing things around the house.  Discuss how to get up and down off the floor, how to pick things up properly, how to safely get out  of a chair without assistance, and balance training.   Meditation and Mindfulness:  -Group instruction provided by verbal instruction, patient participation, and written materials to support subject.  Instructor addresses importance of mindfulness and meditation practice to help reduce stress and improve awareness.  Instructor also leads participants through a meditation exercise.    Stretching for Flexibility and Mobility:  -Group instruction provided by verbal  instruction, patient participation, and written materials to support subject.  Instructors lead participants through series of stretches that are designed to increase flexibility thus improving mobility.  These stretches are additional exercise for major muscle groups that are typically performed during regular warm up and cool down.   Hands Only CPR:  -Group verbal, video, and participation provides a basic overview of AHA guidelines for community CPR. Role-play of emergencies allow participants the opportunity to practice calling for help and chest compression technique with discussion of AED use.   Hypertension: -Group verbal and written instruction that provides a basic overview of hypertension including the most recent diagnostic guidelines, risk factor reduction with self-care instructions and medication management.    Nutrition I class: Heart Healthy Eating:  -Group instruction provided by PowerPoint slides, verbal discussion, and written materials to support subject matter. The instructor gives an explanation and review of the Therapeutic Lifestyle Changes diet recommendations, which includes a discussion on lipid goals, dietary fat, sodium, fiber, plant stanol/sterol esters, sugar, and the components of a well-balanced, healthy diet.   Nutrition II class: Lifestyle Skills:  -Group instruction provided by PowerPoint slides, verbal discussion, and written materials to support subject matter. The instructor gives an explanation  and review of label reading, grocery shopping for heart health, heart healthy recipe modifications, and ways to make healthier choices when eating out.   Diabetes Question & Answer:  -Group instruction provided by PowerPoint slides, verbal discussion, and written materials to support subject matter. The instructor gives an explanation and review of diabetes co-morbidities, pre- and post-prandial blood glucose goals, pre-exercise blood glucose goals, signs, symptoms, and treatment of hypoglycemia and hyperglycemia, and foot care basics.   Diabetes Blitz:  -Group instruction provided by PowerPoint slides, verbal discussion, and written materials to support subject matter. The instructor gives an explanation and review of the physiology behind type 1 and type 2 diabetes, diabetes medications and rational behind using different medications, pre- and post-prandial blood glucose recommendations and Hemoglobin A1c goals, diabetes diet, and exercise including blood glucose guidelines for exercising safely.    Portion Distortion:  -Group instruction provided by PowerPoint slides, verbal discussion, written materials, and food models to support subject matter. The instructor gives an explanation of serving size versus portion size, changes in portions sizes over the last 20 years, and what consists of a serving from each food group.   Stress Management:  -Group instruction provided by verbal instruction, video, and written materials to support subject matter.  Instructors review role of stress in heart disease and how to cope with stress positively.     Exercising on Your Own:  -Group instruction provided by verbal instruction, power point, and written materials to support subject.  Instructors discuss benefits of exercise, components of exercise, frequency and intensity of exercise, and end points for exercise.  Also discuss use of nitroglycerin and activating EMS.  Review options of places to exercise  outside of rehab.  Review guidelines for sex with heart disease.   Cardiac Drugs I:  -Group instruction provided by verbal instruction and written materials to support subject.  Instructor reviews cardiac drug classes: antiplatelets, anticoagulants, beta blockers, and statins.  Instructor discusses reasons, side effects, and lifestyle considerations for each drug class.   Cardiac Drugs II:  -Group instruction provided by verbal instruction and written materials to support subject.  Instructor reviews cardiac drug classes: angiotensin converting enzyme inhibitors (ACE-I), angiotensin II receptor blockers (ARBs), nitrates, and calcium channel blockers.  Instructor discusses reasons, side effects, and  lifestyle considerations for each drug class.   Anatomy and Physiology of the Circulatory System:  Group verbal and written instruction and models provide basic cardiac anatomy and physiology, with the coronary electrical and arterial systems. Review of: AMI, Angina, Valve disease, Heart Failure, Peripheral Artery Disease, Cardiac Arrhythmia, Pacemakers, and the ICD.   Other Education:  -Group or individual verbal, written, or video instructions that support the educational goals of the cardiac rehab program.   Knowledge Questionnaire Score: Knowledge Questionnaire Score - 05/02/17 1133      Knowledge Questionnaire Score   Pre Score  20/24       Core Components/Risk Factors/Patient Goals at Admission: Personal Goals and Risk Factors at Admission - 05/02/17 0831      Core Components/Risk Factors/Patient Goals on Admission    Weight Management  Yes;Weight Maintenance;Weight Loss    Intervention  Weight Management: Develop a combined nutrition and exercise program designed to reach desired caloric intake, while maintaining appropriate intake of nutrient and fiber, sodium and fats, and appropriate energy expenditure required for the weight goal.;Weight Management: Provide education and  appropriate resources to help participant work on and attain dietary goals.;Weight Management/Obesity: Establish reasonable short term and long term weight goals.    Admit Weight  178 lb 12.7 oz (81.1 kg)    Goal Weight: Short Term  175 lb (79.4 kg)    Goal Weight: Long Term  173 lb (78.5 kg)    Expected Outcomes  Short Term: Continue to assess and modify interventions until short term weight is achieved;Long Term: Adherence to nutrition and physical activity/exercise program aimed toward attainment of established weight goal;Weight Maintenance: Understanding of the daily nutrition guidelines, which includes 25-35% calories from fat, 7% or less cal from saturated fats, less than 200mg  cholesterol, less than 1.5gm of sodium, & 5 or more servings of fruits and vegetables daily;Weight Loss: Understanding of general recommendations for a balanced deficit meal plan, which promotes 1-2 lb weight loss per week and includes a negative energy balance of [phone] kcal/d;Understanding recommendations for meals to include 15-35% energy as protein, 25-35% energy from fat, 35-60% energy from carbohydrates, less than 200mg  of dietary cholesterol, 20-35 gm of total fiber daily;Understanding of distribution of calorie intake throughout the day with the consumption of 4-5 meals/snacks    Hypertension  Yes    Intervention  Provide education on lifestyle modifcations including regular physical activity/exercise, weight management, moderate sodium restriction and increased consumption of fresh fruit, vegetables, and low fat dairy, alcohol moderation, and smoking cessation.;Monitor prescription use compliance.    Expected Outcomes  Short Term: Continued assessment and intervention until BP is < 140/68mm HG in hypertensive participants. < 130/100mm HG in hypertensive participants with diabetes, heart failure or chronic kidney disease.;Long Term: Maintenance of blood pressure at goal levels.    Lipids  Yes    Intervention  Provide  education and support for participant on nutrition & aerobic/resistive exercise along with prescribed medications to achieve LDL 70mg , HDL >40mg .    Expected Outcomes  Short Term: Participant states understanding of desired cholesterol values and is compliant with medications prescribed. Participant is following exercise prescription and nutrition guidelines.;Long Term: Cholesterol controlled with medications as prescribed, with individualized exercise RX and with personalized nutrition plan. Value goals: LDL < 70mg , HDL > 40 mg.       Core Components/Risk Factors/Patient Goals Review:  Goals and Risk Factor Review    Row Name 05/17/17 1113 05/21/17 0949 06/18/17 0740 07/16/17 1544  Core Components/Risk Factors/Patient Goals Review   Personal Goals Review  Weight Management/Obesity;Hypertension;Lipids  Weight Management/Obesity;Hypertension;Lipids  Weight Management/Obesity;Hypertension;Lipids  Weight Management/Obesity;Hypertension;Lipids    Review  pt with CAD RF demonstrates eagerness to participate in CR activities.   pt with CAD RF demonstrates eagerness to participate in CR activities. pt BP well managed.  pt actively working towards weight loss goal.  pt reports improved endurance with stair climbing.  pt with CAD RF demonstrates eagerness to participate in CR activities. pt BP well managed.  pt actively working towards weight loss goal.  pt reports improved endurance with stair climbing.  pt with CAD RF demonstrates eagerness to participate in CR activities. pt BP well managed.  pt actively working towards weight loss goal.  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  pt cautioned against overexertion    Expected Outcomes  pt will participate in CR exercise, nutrition and lifestyle education opportunities to reduce overall RF.  pt will participate in CR exercise, nutrition and lifestyle education opportunities to reduce overall RF.  pt will participate in CR exercise,  nutrition and lifestyle education opportunities to reduce overall RF.  pt will participate in CR exercise, nutrition and lifestyle education opportunities to reduce overall RF.       Core Components/Risk Factors/Patient Goals at Discharge (Final Review):  Goals and Risk Factor Review - 07/16/17 1544      Core Components/Risk Factors/Patient Goals Review   Personal Goals Review  Weight Management/Obesity;Hypertension;Lipids    Review  pt with CAD RF demonstrates eagerness to participate in CR activities. pt BP well managed.  pt actively working towards weight loss goal.  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  pt cautioned against overexertion    Expected Outcomes  pt will participate in CR exercise, nutrition and lifestyle education opportunities to reduce overall RF.       ITP Comments: ITP Comments    Row Name 05/02/17 0825 05/28/17 1519 06/18/17 0740 07/16/17 1544     ITP Comments  Medical Director, Dr. Fransico Him  30 day ITP review.  Pt with good participation and attendance.  No change in current regimen unless directed by Medical Director.    30 day ITP review.  Pt with good participation and attendance.  No change in current regimen unless directed by Medical Director.    30 day ITP review.  Pt with good participation and attendance.  No change in current regimen unless directed by Medical Director.         Comments:

## 2017-07-22 ENCOUNTER — Encounter (HOSPITAL_COMMUNITY)
Admission: RE | Admit: 2017-07-22 | Discharge: 2017-07-22 | Disposition: A | Discharge status: Home / Self Care | Payer: Medicare Other | Source: Ambulatory Visit | Attending: Cardiology | Admitting: Cardiology

## 2017-07-22 DIAGNOSIS — Z951 Presence of aortocoronary bypass graft: Secondary | ICD-10-CM | POA: Insufficient documentation

## 2017-07-22 DIAGNOSIS — R7301 Impaired fasting glucose: Secondary | ICD-10-CM | POA: Diagnosis not present

## 2017-07-22 DIAGNOSIS — I2510 Atherosclerotic heart disease of native coronary artery without angina pectoris: Secondary | ICD-10-CM | POA: Diagnosis not present

## 2017-07-22 DIAGNOSIS — E7849 Other hyperlipidemia: Secondary | ICD-10-CM | POA: Diagnosis not present

## 2017-07-22 DIAGNOSIS — I10 Essential (primary) hypertension: Secondary | ICD-10-CM | POA: Diagnosis not present

## 2017-07-22 DIAGNOSIS — Z6827 Body mass index (BMI) 27.0-27.9, adult: Secondary | ICD-10-CM | POA: Diagnosis not present

## 2017-07-22 DIAGNOSIS — R599 Enlarged lymph nodes, unspecified: Secondary | ICD-10-CM | POA: Diagnosis not present

## 2017-07-22 NOTE — Progress Notes (Signed)
Jordan [name] 76 y.o. male DOB: [birth year] MRN: [id number]      Nutrition  1. S/P CABG x 4    Labs:  Lab Results  Component Value Date   HGBA1C 5.7 (H) 04/02/2017   Note Spoke with pt. Nutrition Plan and Nutrition Survey goals reviewed with pt. Pt is following a Heart Healthy diet. Areas to improve diet discussed, which included decreasing [employer] hot dogs to once a week instead of 2 times a week and decreasing sweet tea consumed to 50% sweet 50% unsweet. Pt wants to lose wt. Pt reports he has been losing wt. Pt wt today 80.4 kg, which is down 0.7 kg since admission. Wt loss tips reviewed. Pt is pre-diabetic according to his last A1c. Pre-diabetes discussed.Pt expressed understanding of the information reviewed. Pt aware of nutrition education classes offered .  Nutrition Diagnosis ? Food-and nutrition-related knowledge deficit related to lack of exposure to information as related to diagnosis of: ? CVD ? Pre-DM ? Overweight related to excessive energy intake as evidenced by a BMI of 27.8  Nutrition Intervention ?  Pt's individual nutrition plan reviewed with pt. ? Benefits of adopting Heart Healthy diet discussed when [employer] reviewed.   ? Pt given handouts for: ? Nutrition I class ? Nutrition II class   Nutrition Goal(s):  ? Pt to identify food quantities necessary to achieve weight loss of 6-8 lb at graduation from cardiac rehab. Goal wt of 170 lb desired.   Plan:  Pt to attend nutrition classes ? Portion Distortion  Will provide client-centered nutrition education as part of interdisciplinary care.   Monitor and evaluate progress toward nutrition goal with team.  Derek Mound, M.Ed, RD, LDN, CDE 07/22/2017 8:05 AM

## 2017-07-24 ENCOUNTER — Encounter (HOSPITAL_COMMUNITY)
Admission: RE | Admit: 2017-07-24 | Discharge: 2017-07-24 | Disposition: A | Discharge status: Home / Self Care | Payer: Medicare Other | Source: Ambulatory Visit | Attending: Cardiology | Admitting: Cardiology

## 2017-07-24 DIAGNOSIS — Z951 Presence of aortocoronary bypass graft: Secondary | ICD-10-CM

## 2017-07-26 ENCOUNTER — Other Ambulatory Visit: Payer: Self-pay | Admitting: Cardiology

## 2017-07-26 ENCOUNTER — Encounter (HOSPITAL_COMMUNITY)
Admission: RE | Admit: 2017-07-26 | Discharge: 2017-07-26 | Disposition: A | Discharge status: Home / Self Care | Payer: Medicare Other | Source: Ambulatory Visit | Attending: Cardiology | Admitting: Cardiology

## 2017-07-26 DIAGNOSIS — Z951 Presence of aortocoronary bypass graft: Secondary | ICD-10-CM

## 2017-07-29 ENCOUNTER — Encounter (HOSPITAL_COMMUNITY): Payer: Medicare Other

## 2017-07-31 ENCOUNTER — Encounter (HOSPITAL_COMMUNITY)
Admission: RE | Admit: 2017-07-31 | Discharge: 2017-07-31 | Disposition: A | Discharge status: Home / Self Care | Payer: Medicare Other | Source: Ambulatory Visit | Attending: Cardiology | Admitting: Cardiology

## 2017-07-31 DIAGNOSIS — Z951 Presence of aortocoronary bypass graft: Secondary | ICD-10-CM | POA: Diagnosis not present

## 2017-08-02 ENCOUNTER — Encounter (HOSPITAL_COMMUNITY)
Admission: RE | Admit: 2017-08-02 | Discharge: 2017-08-02 | Disposition: A | Discharge status: Home / Self Care | Payer: Medicare Other | Source: Ambulatory Visit | Attending: Cardiology | Admitting: Cardiology

## 2017-08-02 DIAGNOSIS — Z951 Presence of aortocoronary bypass graft: Secondary | ICD-10-CM

## 2017-08-05 ENCOUNTER — Encounter (HOSPITAL_COMMUNITY)
Admission: RE | Admit: 2017-08-05 | Discharge: 2017-08-05 | Disposition: A | Discharge status: Home / Self Care | Payer: Medicare Other | Source: Ambulatory Visit | Attending: Cardiology | Admitting: Cardiology

## 2017-08-05 VITALS — Ht 67.25 in | Wt 181.0 lb

## 2017-08-05 DIAGNOSIS — Z951 Presence of aortocoronary bypass graft: Secondary | ICD-10-CM | POA: Diagnosis not present

## 2017-08-06 ENCOUNTER — Encounter (HOSPITAL_COMMUNITY): Payer: Self-pay

## 2017-08-07 ENCOUNTER — Encounter (HOSPITAL_COMMUNITY)
Admission: RE | Admit: 2017-08-07 | Discharge: 2017-08-07 | Disposition: A | Discharge status: Home / Self Care | Payer: Medicare Other | Source: Ambulatory Visit | Attending: Cardiology | Admitting: Cardiology

## 2017-08-07 DIAGNOSIS — Z951 Presence of aortocoronary bypass graft: Secondary | ICD-10-CM | POA: Diagnosis not present

## 2017-08-09 ENCOUNTER — Encounter (HOSPITAL_COMMUNITY)
Admission: RE | Admit: 2017-08-09 | Discharge: 2017-08-09 | Disposition: A | Discharge status: Home / Self Care | Payer: Medicare Other | Source: Ambulatory Visit | Attending: Cardiology | Admitting: Cardiology

## 2017-08-09 DIAGNOSIS — Z951 Presence of aortocoronary bypass graft: Secondary | ICD-10-CM | POA: Diagnosis not present

## 2017-08-16 ENCOUNTER — Ambulatory Visit (HOSPITAL_COMMUNITY): Payer: Self-pay | Admitting: Cardiac Rehabilitation

## 2017-08-16 DIAGNOSIS — Z951 Presence of aortocoronary bypass graft: Secondary | ICD-10-CM

## 2017-08-16 NOTE — Progress Notes (Signed)
Cardiac Individual Treatment Plan  Patient Details  Name: Jordan [name] MRN: [id number] Date of Birth: 01-04-1941 Referring Provider:     CARDIAC REHAB PHASE II ORIENTATION from 05/02/2017 in [place]  Referring Provider  Glenetta Hew MD      Initial Encounter Date:    CARDIAC REHAB PHASE II ORIENTATION from 05/02/2017 in [place]  Date  05/02/17  Referring Provider  Glenetta Hew MD      Visit Diagnosis: S/P CABG x 4  Patient's Home Medications on Admission:  Current Outpatient Medications:  .  aspirin 325 MG EC tablet, Take 1 tablet (325 mg total) by mouth daily., Disp: 30 tablet, Rfl: 0 .  Calcium Citrate-Vitamin D (CITRACAL MAXIMUM) 315-250 MG-UNIT TABS, Take 1 tablet by mouth 2 (two) times daily. , Disp: , Rfl:  .  cholecalciferol (VITAMIN D) 1000 UNITS tablet, Take 1,000 Units by mouth 2 (two) times daily. , Disp: , Rfl:  .  Cyanocobalamin (VITAMIN B-12 PO), Take 1 tablet by mouth every evening. , Disp: , Rfl:  .  diphenhydramine-acetaminophen (TYLENOL PM) 25-500 MG TABS tablet, Take 0.5 tablets by mouth at bedtime as needed (for sleep.)., Disp: , Rfl:  .  ezetimibe (ZETIA) 10 MG tablet, Take 10 mg by mouth every evening. , Disp: , Rfl:  .  furosemide (LASIX) 40 MG tablet, Take 1 tablet (40 mg total) by mouth daily as needed. (Patient not taking: Reported on 07/08/2017), Disp: , Rfl:  .  metoprolol tartrate (LOPRESSOR) 25 MG tablet, TAKE 1/2 TABLET TWICE A DAY, Disp: 45 tablet, Rfl: 2 .  Multiple Vitamins-Minerals (CENTRUM SILVER PO), Take 1 tablet by mouth daily., Disp: , Rfl:  .  pravastatin (PRAVACHOL) 20 MG tablet, Take 1 tablet (20 mg total) by mouth daily at 6 PM., Disp: 30 tablet, Rfl: 1 .  ranitidine (ZANTAC) 150 MG tablet, Take 150 mg by mouth 2 (two) times daily., Disp: , Rfl:   Past Medical History: Past Medical History:  Diagnosis Date  . DDD (degenerative disc disease)   . Dyspnea   .  ED (erectile dysfunction)   . GERD (gastroesophageal reflux disease)   . History of hiatal hernia   . History of kidney stones   . HTN (hypertension)   . Hyperlipidemia   . Impaired fasting glucose   . Knee pain    bilateral  . Left main coronary artery disease 03/18/2017   Cardiac Cath: 55% dLM, tandem p-mLAD 75% --> referred for CABG.  . Meniere's disease   . Meniere's vertigo   . Mild carotid artery disease ([place])   . Ocular migraine   . 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 Cyndia Bent @ [facility]):  LIMA-LAD, SVG-D2, SVG-OM, SVG-PDA (endoscopic SVG harvesting from right leg).  . Shoulder pain   . Wears hearing aid     Tobacco Use: Social History   Tobacco Use  Smoking Status Never Smoker  Smokeless Tobacco Never Used    Labs: Recent Review Flowsheet Data    Labs for ITP Cardiac and Pulmonary Rehab Latest Ref Rng & Units 04/04/2017 04/04/2017 04/04/2017 04/04/2017 04/05/2017   Hemoglobin A1c 4.8 - 5.6 % - - - - -   PHART 7.350 - 7.450 7.417 7.360 - 7.355 -   PCO2ART 32.0 - 48.0 mmHg 39.3 44.9 - 42.9 -   HCO3 20.0 - 28.0 mmol/L 25.5 25.2 - 23.7 -   TCO2 0 - 100 mmol/L 27 27 24  25  22   ACIDBASEDEF 0.0 - 2.0 mmol/L - - - 2.0 -   O2SAT % 95.0 97.0 - 97.0 -      Capillary Blood Glucose: Lab Results  Component Value Date   GLUCAP 139 (H) 04/05/2017   GLUCAP 101 (H) 04/05/2017   GLUCAP 102 (H) 04/05/2017   GLUCAP 96 04/04/2017   GLUCAP 96 04/04/2017     Exercise Target Goals:    Exercise Program Goal: Individual exercise prescription set with THRR, safety & activity barriers. Participant demonstrates ability to understand and report RPE using BORG scale, to self-measure pulse accurately, and to acknowledge the importance of the exercise prescription.  Exercise Prescription Goal: Starting with aerobic activity 30 plus minutes a day, 3 days per week for initial exercise prescription. Provide home exercise prescription and guidelines that participant acknowledges  understanding prior to discharge.  Activity Barriers & Risk Stratification:   6 Minute Walk: 6 Minute Walk    Row Name 08/05/17 1344         6 Minute Walk   Phase  Discharge     Distance  2200 feet     Distance % Change  19.7 %     Distance Feet Change  362 ft     Walk Time  6 minutes     # of Rest Breaks  0     MPH  4.16     METS  4.35     RPE  14     Perceived Dyspnea   0     VO2 Peak  15.21     Symptoms  No     Resting HR  56 bpm     Resting BP  124/78     Resting Oxygen Saturation   95 %     Exercise Oxygen Saturation  during 6 min walk  96 %     Max Ex. HR  94 bpm     Max Ex. BP  158/70     2 Minute Post BP  118/74        Oxygen Initial Assessment:   Oxygen Re-Evaluation:   Oxygen Discharge (Final Oxygen Re-Evaluation):   Initial Exercise Prescription:   Perform Capillary Blood Glucose checks as needed.  Exercise Prescription Changes: Exercise Prescription Changes    Row Name 06/17/17 0800 07/08/17 0655 07/17/17 0800 07/24/17 0735 08/05/17 1600     Response to Exercise   Blood Pressure (Admit)  122/66  108/75  120/70  112/74  124/78   Blood Pressure (Exercise)  158/78  128/78  140/80  130/70  158/70   Blood Pressure (Exit)  124/72  104/70  124/60  104/67  118/74   Heart Rate (Admit)  57 bpm  67 bpm  58 bpm  67 bpm  64 bpm   Heart Rate (Exercise)  84 bpm  95 bpm  113 bpm  111 bpm  111 bpm   Heart Rate (Exit)  67 bpm  66 bpm  68 bpm  67 bpm  68 bpm   Rating of Perceived Exertion (Exercise)  13  17  16  15  16    Symptoms  none  none  none  none  none   Duration  Continue with 30 min of aerobic exercise without signs/symptoms of physical distress.  Continue with 30 min of aerobic exercise without signs/symptoms of physical distress.  Continue with 30 min of aerobic exercise without signs/symptoms of physical distress.  Continue with 30 min of aerobic exercise without signs/symptoms of physical  distress.  Continue with 45 min of aerobic exercise without  signs/symptoms of physical distress.   Intensity  THRR unchanged  THRR unchanged  THRR unchanged  THRR unchanged  THRR unchanged     Progression   Progression  Continue to progress workloads to maintain intensity without signs/symptoms of physical distress.  Continue to progress workloads to maintain intensity without signs/symptoms of physical distress.  Continue to progress workloads to maintain intensity without signs/symptoms of physical distress.  Continue to progress workloads to maintain intensity without signs/symptoms of physical distress.  Continue to progress workloads to maintain intensity without signs/symptoms of physical distress.   Average METs  4.3  6.9  7.1  7.3  7.7     Resistance Training   Training Prescription  Yes  Yes  No Relaxation today  No Relaxation today  Yes   Weight  5lbs  9/7lbs  -  -  7 lbs   Reps  10-15  10-15  -  -  10-15   Time  10 Minutes  10 Minutes  -  -  10 Minutes     Interval Training   Interval Training  No  Yes  Yes  Yes  Yes   Equipment  -  Bike;NuStep;Track  Bike;NuStep;Track  Bike;NuStep;Track  Bike;NuStep;Track     Bike   Level  1.7  2.2 4.0 HIIT 10.23  2.2 4.0 HIIT 10.32  2.2 4.0 HIIT 10.39  2.2 HITT at Level 4 for 2 minutes    Minutes  10  10  10  10  10    METs  4.94  4.94  6.12  7.01  6.88     NuStep   Level  6  6 8  HIIT  6 8 HIIT  6 8 HIIT  6 HITT at Level 8 at 2 mins    SPM  90  90  90  90  90   Minutes  10  10  10  10  10    METs  4.1  7.4  8.1  7.6  8.6     Track   Laps  16  16  17  18  18    Minutes  10  10  10  10  10    METs  3.79  3.79  3.95  4.13  4.13     Home Exercise Plan   Plans to continue exercise at  Home (comment)  Home (comment)  Home (comment)  Home (comment)  Home (comment)   Frequency  Add 3 additional days to program exercise sessions.  Add 3 additional days to program exercise sessions.  Add 3 additional days to program exercise sessions.  Add 3 additional days to program exercise sessions.  Add 3 additional  days to program exercise sessions.   Initial Home Exercises Provided  05/22/17  05/22/17  05/22/17  05/22/17  05/22/17      Exercise Comments: Exercise Comments    Row Name 06/17/17 1962 07/08/17 0655 07/17/17 0700 08/02/17 1331     Exercise Comments  Reviewed goals with patient.  Reviewed METs and goals with patient. Re-oriented patient to HIIT.  Reviewed METs with patient.  Reviewed METs and goals with patient. Pt is tolerating HIIT program well; will continue to monitor progress and activity levels.        Exercise Goals and Review: Exercise Goals    Row Name 08/02/17 1354             Exercise Goals   Knowledge and understanding of  Target Heart Rate Range (THRR)  Yes          Exercise Goals Re-Evaluation : Exercise Goals Re-Evaluation    Row Name 06/17/17 0651 07/08/17 0655 08/02/17 1333         Exercise Goal Re-Evaluation   Exercise Goals Review  Increase Physical Activity  Increase Physical Activity  Increase Physical Activity;Able to understand and use rate of perceived exertion (RPE) scale     Comments  Patient states he's walking 15 minutes, 5 days/week. Encouraged pt to increase duration gradually to 30 minutes, 5 days/week, and pt is agreeable to this.  Oriented patient to high intensity interval training (HIIT) on 06/28/17. Pt has beeen out x1 week, re-oriented to HIIT. Pt did fairly well. reviewed THRR and increase in THRR for HIIT.  Patient is enjoying high intensity interval training (HIIT). Pt is very motivated to improve his cardiovascular system and has a goal to average 8 METs on his exercise equipement.     Expected Outcomes  Increase walking duration to 30 minutes, 5 days/week.  Patient plans to continue walking for his home exercise and is also looking into buying a recumbent stepper for home use.  Patient continues to walk at home 2-3x a week for 30 minutes. Patient has also started doing stretches at home as part of his home exercise program.           Discharge Exercise Prescription (Final Exercise Prescription Changes): Exercise Prescription Changes - 08/05/17 1600      Response to Exercise   Blood Pressure (Admit)  124/78    Blood Pressure (Exercise)  158/70    Blood Pressure (Exit)  118/74    Heart Rate (Admit)  64 bpm    Heart Rate (Exercise)  111 bpm    Heart Rate (Exit)  68 bpm    Rating of Perceived Exertion (Exercise)  16    Symptoms  none    Duration  Continue with 45 min of aerobic exercise without signs/symptoms of physical distress.    Intensity  THRR unchanged      Progression   Progression  Continue to progress workloads to maintain intensity without signs/symptoms of physical distress.    Average METs  7.7      Resistance Training   Training Prescription  Yes    Weight  7 lbs    Reps  10-15    Time  10 Minutes      Interval Training   Interval Training  Yes    Equipment  Bike;NuStep;Track      Bike   Level  2.2 HITT at Level 4 for 2 minutes     Minutes  10    METs  6.88      NuStep   Level  6 HITT at Level 8 at 2 mins     SPM  90    Minutes  10    METs  8.6      Track   Laps  18    Minutes  10    METs  4.13      Home Exercise Plan   Plans to continue exercise at  Home (comment)    Frequency  Add 3 additional days to program exercise sessions.    Initial Home Exercises Provided  05/22/17       Nutrition:  Target Goals: Understanding of nutrition guidelines, daily intake of sodium 1500mg , cholesterol 200mg , calories 30% from fat and 7% or less from saturated fats, daily to have 5 or  more servings of fruits and vegetables.  Biometrics:  Post Biometrics - 08/05/17 1342       Post  Biometrics   Height  5' 7.25" (1.708 m)    Weight  180 lb 16 oz (82.1 kg)    Waist Circumference  38.5 inches    Hip Circumference  38 inches    Waist to Hip Ratio  1.01 %    BMI (Calculated)  28.14    Triceps Skinfold  14 mm    % Body Fat  26.9 %    Grip Strength  44 kg    Flexibility  14 in     Single Leg Stand  40 seconds       Nutrition Therapy Plan and Nutrition Goals:   Nutrition Discharge: Nutrition Scores:   Nutrition Goals Re-Evaluation:   Nutrition Goals Re-Evaluation:   Nutrition Goals Discharge (Final Nutrition Goals Re-Evaluation):   Psychosocial: Target Goals: Acknowledge presence or absence of significant depression and/or stress, maximize coping skills, provide positive support system. Participant is able to verbalize types and ability to use techniques and skills needed for reducing stress and depression.  Initial Review & Psychosocial Screening:   Quality of Life Scores:   PHQ-9: Recent Review Flowsheet Data    Depression screen [facility] 2/9 05/06/2017   Decreased Interest 0   Down, Depressed, Hopeless 0   PHQ - 2 Score 0     Interpretation of Total Score  Total Score Depression Severity:  1-4 = Minimal depression, 5-9 = Mild depression, 10-14 = Moderate depression, 15-19 = Moderately severe depression, 20-27 = Severe depression   Psychosocial Evaluation and Intervention:   Psychosocial Re-Evaluation: Psychosocial Re-Evaluation    Row Name 06/18/17 0741 07/16/17 1545 08/06/17 1509 08/16/17 1640       Psychosocial Re-Evaluation   Current issues with  None Identified  None Identified  None Identified  None Identified    Comments  no psychosocial needs identified, no interventions necessary   no psychosocial needs identified, no interventions necessary   no psychosocial needs identified, no interventions necessary   no psychosocial needs identified, no interventions necessary     Expected Outcomes  pt will exhibit positive outlook wtih good coping skills.   pt will exhibit positive outlook wtih good coping skills.   pt will exhibit positive outlook wtih good coping skills.   pt will exhibit positive outlook wtih good coping skills.     Interventions  Encouraged to attend Cardiac Rehabilitation for the exercise  Encouraged to attend Cardiac  Rehabilitation for the exercise  Encouraged to attend Cardiac Rehabilitation for the exercise  Encouraged to attend Cardiac Rehabilitation for the exercise    Continue Psychosocial Services   No Follow up required  No Follow up required  No Follow up required  No Follow up required       Psychosocial Discharge (Final Psychosocial Re-Evaluation): Psychosocial Re-Evaluation - 08/16/17 1640      Psychosocial Re-Evaluation   Current issues with  None Identified    Comments  no psychosocial needs identified, no interventions necessary     Expected Outcomes  pt will exhibit positive outlook wtih good coping skills.     Interventions  Encouraged to attend Cardiac Rehabilitation for the exercise    Continue Psychosocial Services   No Follow up required       Vocational Rehabilitation: Provide vocational rehab assistance to qualifying candidates.   Vocational Rehab Evaluation & Intervention:   Education: Education Goals: Education classes will be provided on a  weekly basis, covering required topics. Participant will state understanding/return demonstration of topics presented.  Learning Barriers/Preferences:   Education Topics: Count Your Pulse:  -Group instruction provided by verbal instruction, demonstration, patient participation and written materials to support subject.  Instructors address importance of being able to find your pulse and how to count your pulse when at home without a heart monitor.  Patients get hands on experience counting their pulse with staff help and individually.   Heart Attack, Angina, and Risk Factor Modification:  -Group instruction provided by verbal instruction, video, and written materials to support subject.  Instructors address signs and symptoms of angina and heart attacks.    Also discuss risk factors for heart disease and how to make changes to improve heart health risk factors.   CARDIAC REHAB PHASE II EXERCISE from 05/08/2017 in [place]  Date  05/08/17  Instruction Review Code  2- meets goals/outcomes      Functional Fitness:  -Group instruction provided by verbal instruction, demonstration, patient participation, and written materials to support subject.  Instructors address safety measures for doing things around the house.  Discuss how to get up and down off the floor, how to pick things up properly, how to safely get out of a chair without assistance, and balance training.   Meditation and Mindfulness:  -Group instruction provided by verbal instruction, patient participation, and written materials to support subject.  Instructor addresses importance of mindfulness and meditation practice to help reduce stress and improve awareness.  Instructor also leads participants through a meditation exercise.    Stretching for Flexibility and Mobility:  -Group instruction provided by verbal instruction, patient participation, and written materials to support subject.  Instructors lead participants through series of stretches that are designed to increase flexibility thus improving mobility.  These stretches are additional exercise for major muscle groups that are typically performed during regular warm up and cool down.   Hands Only CPR:  -Group verbal, video, and participation provides a basic overview of AHA guidelines for community CPR. Role-play of emergencies allow participants the opportunity to practice calling for help and chest compression technique with discussion of AED use.   Hypertension: -Group verbal and written instruction that provides a basic overview of hypertension including the most recent diagnostic guidelines, risk factor reduction with self-care instructions and medication management.    Nutrition I class: Heart Healthy Eating:  -Group instruction provided by PowerPoint slides, verbal discussion, and written materials to support subject matter. The instructor gives an explanation  and review of the Therapeutic Lifestyle Changes diet recommendations, which includes a discussion on lipid goals, dietary fat, sodium, fiber, plant stanol/sterol esters, sugar, and the components of a well-balanced, healthy diet.   CARDIAC REHAB PHASE II EXERCISE from 07/22/2017 in [place]  Date  07/22/17 [place] handouts given]  Educator  RD  Instruction Review Code  Not applicable      Nutrition II class: Lifestyle Skills:  -Group instruction provided by PowerPoint slides, verbal discussion, and written materials to support subject matter. The instructor gives an explanation and review of label reading, grocery shopping for heart health, heart healthy recipe modifications, and ways to make healthier choices when eating out.   CARDIAC REHAB PHASE II EXERCISE from 07/22/2017 in [place]  Date  07/22/17 [place] handouts given]  Educator  RD  Instruction Review Code  Not applicable      Diabetes Question & Answer:  -Group instruction  provided by PowerPoint slides, verbal discussion, and written materials to support subject matter. The instructor gives an explanation and review of diabetes co-morbidities, pre- and post-prandial blood glucose goals, pre-exercise blood glucose goals, signs, symptoms, and treatment of hypoglycemia and hyperglycemia, and foot care basics.   Diabetes Blitz:  -Group instruction provided by PowerPoint slides, verbal discussion, and written materials to support subject matter. The instructor gives an explanation and review of the physiology behind type 1 and type 2 diabetes, diabetes medications and rational behind using different medications, pre- and post-prandial blood glucose recommendations and Hemoglobin A1c goals, diabetes diet, and exercise including blood glucose guidelines for exercising safely.    Portion Distortion:  -Group instruction provided by PowerPoint slides, verbal discussion, written  materials, and food models to support subject matter. The instructor gives an explanation of serving size versus portion size, changes in portions sizes over the last 20 years, and what consists of a serving from each food group.   Stress Management:  -Group instruction provided by verbal instruction, video, and written materials to support subject matter.  Instructors review role of stress in heart disease and how to cope with stress positively.     Exercising on Your Own:  -Group instruction provided by verbal instruction, power point, and written materials to support subject.  Instructors discuss benefits of exercise, components of exercise, frequency and intensity of exercise, and end points for exercise.  Also discuss use of nitroglycerin and activating EMS.  Review options of places to exercise outside of rehab.  Review guidelines for sex with heart disease.   Cardiac Drugs I:  -Group instruction provided by verbal instruction and written materials to support subject.  Instructor reviews cardiac drug classes: antiplatelets, anticoagulants, beta blockers, and statins.  Instructor discusses reasons, side effects, and lifestyle considerations for each drug class.   Cardiac Drugs II:  -Group instruction provided by verbal instruction and written materials to support subject.  Instructor reviews cardiac drug classes: angiotensin converting enzyme inhibitors (ACE-I), angiotensin II receptor blockers (ARBs), nitrates, and calcium channel blockers.  Instructor discusses reasons, side effects, and lifestyle considerations for each drug class.   Anatomy and Physiology of the Circulatory System:  Group verbal and written instruction and models provide basic cardiac anatomy and physiology, with the coronary electrical and arterial systems. Review of: AMI, Angina, Valve disease, Heart Failure, Peripheral Artery Disease, Cardiac Arrhythmia, Pacemakers, and the ICD.   Other Education:  -Group or  individual verbal, written, or video instructions that support the educational goals of the cardiac rehab program.   Knowledge Questionnaire Score:   Core Components/Risk Factors/Patient Goals at Admission:   Core Components/Risk Factors/Patient Goals Review:  Goals and Risk Factor Review    Row Name 06/18/17 0740 07/16/17 1544 08/06/17 1508 08/16/17 1640       Core Components/Risk Factors/Patient Goals Review   Personal Goals Review  Weight Management/Obesity;Hypertension;Lipids  Weight Management/Obesity;Hypertension;Lipids  Weight Management/Obesity;Hypertension;Lipids  Weight Management/Obesity;Hypertension;Lipids    Review  pt with CAD RF demonstrates eagerness to participate in CR activities. pt BP well managed.  pt actively working towards weight loss goal.  pt reports improved endurance with stair climbing.  pt with CAD RF demonstrates eagerness to participate in CR activities. pt BP well managed.  pt actively working towards weight loss goal.  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  pt cautioned against overexertion  pt with CAD RF demonstrates eagerness to participate in CR activities. pt BP well managed.  pt actively working towards  weight loss goal.  pt tolerating home activities as directed by physician.    pt cautioned against overexertion  pt with CAD RF demonstrates eagerness to participate in CR activities. pt BP well managed.  pt actively working towards weight loss goal.  pt tolerating home activities as directed by physician.    pt cautioned against overexertion    Expected Outcomes  pt will participate in CR exercise, nutrition and lifestyle education opportunities to reduce overall RF.  pt will participate in CR exercise, nutrition and lifestyle education opportunities to reduce overall RF.  pt will participate in CR exercise, nutrition and lifestyle education opportunities to reduce overall RF.  pt will participate in CR exercise, nutrition and lifestyle  education opportunities to reduce overall RF.       Core Components/Risk Factors/Patient Goals at Discharge (Final Review):  Goals and Risk Factor Review - 08/16/17 1640      Core Components/Risk Factors/Patient Goals Review   Personal Goals Review  Weight Management/Obesity;Hypertension;Lipids    Review  pt with CAD RF demonstrates eagerness to participate in CR activities. pt BP well managed.  pt actively working towards weight loss goal.  pt tolerating home activities as directed by physician.    pt cautioned against overexertion    Expected Outcomes  pt will participate in CR exercise, nutrition and lifestyle education opportunities to reduce overall RF.       ITP Comments: ITP Comments    Row Name 06/18/17 0740 07/16/17 1544 08/06/17 1507 08/16/17 1640     ITP Comments  30 day ITP review.  Pt with good participation and attendance.  No change in current regimen unless directed by Medical Director.    30 day ITP review.  Pt with good participation and attendance.  No change in current regimen unless directed by Medical Director.    30 day ITP review.  Pt with good participation and attendance. pt enjoying HIIT program.   30 day ITP review.  Pt with good participation and attendance. pt enjoying HIIT program.        Comments:

## 2017-08-21 ENCOUNTER — Telehealth (HOSPITAL_COMMUNITY): Payer: Self-pay | Admitting: Cardiac Rehabilitation

## 2017-08-21 NOTE — Telephone Encounter (Signed)
pc to pt to assess reason for continued absence from cardiac rehab. LMOM. Andi Hence, RN, BSN Cardiac Pulmonary Rehab 08/21/17  4:12 PM

## 2017-08-23 ENCOUNTER — Encounter (HOSPITAL_COMMUNITY)
Admission: RE | Admit: 2017-08-23 | Discharge: 2017-08-23 | Disposition: A | Discharge status: Home / Self Care | Payer: Medicare Other | Source: Ambulatory Visit | Attending: Cardiology | Admitting: Cardiology

## 2017-08-23 DIAGNOSIS — H6121 Impacted cerumen, right ear: Secondary | ICD-10-CM | POA: Diagnosis not present

## 2017-08-23 DIAGNOSIS — Z951 Presence of aortocoronary bypass graft: Secondary | ICD-10-CM | POA: Diagnosis not present

## 2017-08-23 DIAGNOSIS — T161XXA Foreign body in right ear, initial encounter: Secondary | ICD-10-CM | POA: Diagnosis not present

## 2017-08-23 DIAGNOSIS — H9193 Unspecified hearing loss, bilateral: Secondary | ICD-10-CM | POA: Diagnosis not present

## 2017-08-23 DIAGNOSIS — Z6828 Body mass index (BMI) 28.0-28.9, adult: Secondary | ICD-10-CM | POA: Diagnosis not present

## 2017-08-26 ENCOUNTER — Encounter (HOSPITAL_COMMUNITY)
Admission: RE | Admit: 2017-08-26 | Discharge: 2017-08-26 | Disposition: A | Discharge status: Home / Self Care | Payer: Medicare Other | Source: Ambulatory Visit | Attending: Cardiology | Admitting: Cardiology

## 2017-08-26 DIAGNOSIS — Z951 Presence of aortocoronary bypass graft: Secondary | ICD-10-CM

## 2017-08-28 ENCOUNTER — Encounter (HOSPITAL_COMMUNITY)
Admission: RE | Admit: 2017-08-28 | Discharge: 2017-08-28 | Disposition: A | Discharge status: Home / Self Care | Payer: Medicare Other | Source: Ambulatory Visit | Attending: Cardiology | Admitting: Cardiology

## 2017-08-28 DIAGNOSIS — Z6828 Body mass index (BMI) 28.0-28.9, adult: Secondary | ICD-10-CM | POA: Diagnosis not present

## 2017-08-28 DIAGNOSIS — Z951 Presence of aortocoronary bypass graft: Secondary | ICD-10-CM | POA: Diagnosis not present

## 2017-08-28 DIAGNOSIS — H9193 Unspecified hearing loss, bilateral: Secondary | ICD-10-CM | POA: Diagnosis not present

## 2017-08-28 DIAGNOSIS — T161XXA Foreign body in right ear, initial encounter: Secondary | ICD-10-CM | POA: Diagnosis not present

## 2017-08-28 DIAGNOSIS — H6121 Impacted cerumen, right ear: Secondary | ICD-10-CM | POA: Diagnosis not present

## 2017-09-02 ENCOUNTER — Encounter (HOSPITAL_COMMUNITY)
Admission: RE | Admit: 2017-09-02 | Discharge: 2017-09-02 | Disposition: A | Discharge status: Home / Self Care | Payer: Medicare Other | Source: Ambulatory Visit | Attending: Cardiology | Admitting: Cardiology

## 2017-09-02 DIAGNOSIS — Z951 Presence of aortocoronary bypass graft: Secondary | ICD-10-CM | POA: Diagnosis not present

## 2017-09-04 ENCOUNTER — Encounter (HOSPITAL_COMMUNITY)
Admission: RE | Admit: 2017-09-04 | Discharge: 2017-09-04 | Disposition: A | Discharge status: Home / Self Care | Payer: Medicare Other | Source: Ambulatory Visit | Attending: Cardiology | Admitting: Cardiology

## 2017-09-04 DIAGNOSIS — Z951 Presence of aortocoronary bypass graft: Secondary | ICD-10-CM

## 2017-09-20 ENCOUNTER — Encounter (HOSPITAL_COMMUNITY): Payer: Self-pay

## 2017-09-20 NOTE — Progress Notes (Addendum)
Discharge Progress Report  Patient Details  Name: Jordan [name] MRN: [id number] Date of Birth: 1941-03-28 Referring Provider:     CARDIAC REHAB PHASE II ORIENTATION from 05/02/2017 in [place]  Referring Provider  Glenetta Hew MD       Number of Visits: 36   Reason for Discharge:  Patient has met program and personal goals.  Smoking History:  Social History   Tobacco Use  Smoking Status Never Smoker  Smokeless Tobacco Never Used    Diagnosis:  S/P CABG x 4  ADL [facility]:   Initial Exercise Prescription: Initial Exercise Prescription - 05/02/17 1100      Date of Initial Exercise RX and Referring Provider   Date  05/02/17    Referring Provider  Glenetta Hew MD      Bike   Level  0.8    Minutes  10    METs  2.89      NuStep   Level  3    SPM  80    Minutes  10    METs  2.5      Track   Laps  11    Minutes  10    METs  2.92      Prescription Details   Frequency (times per week)  3    Duration  Progress to 30 minutes of continuous aerobic without signs/symptoms of physical distress      Intensity   THRR 40-80% of Max Heartrate  58-117    Ratings of Perceived Exertion  11-13    Perceived Dyspnea  0-4      Progression   Progression  Continue to progress workloads to maintain intensity without signs/symptoms of physical distress.      Resistance Training   Training Prescription  Yes    Weight  3lbs    Reps  10-15       Discharge Exercise Prescription (Final Exercise Prescription Changes): Exercise Prescription Changes - 09/04/17 0844      Response to Exercise   Blood Pressure (Admit)  110/60    Blood Pressure (Exercise)  158/70    Blood Pressure (Exit)  122/66    Heart Rate (Admit)  65 bpm    Heart Rate (Exercise)  115 bpm    Heart Rate (Exit)  58 bpm    Rating of Perceived Exertion (Exercise)  15    Symptoms  None    Duration  Continue with 45 min of aerobic exercise without signs/symptoms of physical  distress.    Intensity  THRR unchanged      Progression   Progression  Continue to progress workloads to maintain intensity without signs/symptoms of physical distress.    Average METs  6.7      Resistance Training   Training Prescription  Yes    Weight  7lbs    Reps  10-15    Time  10 Minutes      Interval Training   Interval Training  Yes    Equipment  Bike;NuStep      Bike   Level  2.2 HIIT Level 4.0:10.25 METs    Minutes  10    METs  6.9      NuStep   Level  6    SPM  90    Minutes  10    METs  8.7      Track   Laps  18    Minutes  10    METs  4.13  Home Exercise Plan   Plans to continue exercise at  Maintenance    Frequency  Add 3 additional days to program exercise sessions.    Initial Home Exercises Provided  05/22/17       Functional Capacity: 6 Minute Walk    Row Name 05/02/17 1133 08/05/17 1344       6 Minute Walk   Phase  Initial  Discharge    Distance  1838 feet  2200 feet    Distance % Change  -  19.7 %    Distance Feet Change  -  362 ft    Walk Time  6 minutes  6 minutes    # of Rest Breaks  0  0    MPH  3.48  4.16    METS  3.3  4.35    RPE  11  14    Perceived Dyspnea   -  0    VO2 Peak  -  15.21    Symptoms  No  No    Resting HR  65 bpm  56 bpm    Resting BP  124/60  124/78    Resting Oxygen Saturation   95 %  95 %    Exercise Oxygen Saturation  during 6 min walk  95 %  96 %    Max Ex. HR  82 bpm  94 bpm    Max Ex. BP  124/60  158/70    2 Minute Post BP  118/60  118/74       Psychological, QOL, Others - Outcomes: PHQ 2/9: Depression screen [facility] 2/9 09/02/2017 05/06/2017  Decreased Interest 0 0  Down, Depressed, Hopeless 0 0  PHQ - 2 Score 0 0    Quality of Life: Quality of Life - 09/02/17 1622      Quality of Life Scores   Health/Function Pre  26.17 %    Health/Function Post  24.83 %    Health/Function % Change  -5.12 %    Socioeconomic Pre  28.21 %    Socioeconomic Post  24.56 %    Socioeconomic % Change   -12.94 %     Psych/Spiritual Pre  25.29 %    Psych/Spiritual Post  23.86 %    Psych/Spiritual % Change  -5.65 %    Family Pre  22.8 %    Family Post  22.5 %    Family % Change  -1.32 %    GLOBAL Pre  25.91 %    GLOBAL Post  24.29 %    GLOBAL % Change  -6.25 %       Personal Goals: Goals established at orientation with interventions provided to work toward goal. Personal Goals and Risk Factors at Admission - 05/02/17 0831      Core Components/Risk Factors/Patient Goals on Admission    Weight Management  Yes;Weight Maintenance;Weight Loss    Intervention  Weight Management: Develop a combined nutrition and exercise program designed to reach desired caloric intake, while maintaining appropriate intake of nutrient and fiber, sodium and fats, and appropriate energy expenditure required for the weight goal.;Weight Management: Provide education and appropriate resources to help participant work on and attain dietary goals.;Weight Management/Obesity: Establish reasonable short term and long term weight goals.    Admit Weight  178 lb 12.7 oz (81.1 kg)    Goal Weight: Short Term  175 lb (79.4 kg)    Goal Weight: Long Term  173 lb (78.5 kg)    Expected Outcomes  Short Term:  Continue to assess and modify interventions until short term weight is achieved;Long Term: Adherence to nutrition and physical activity/exercise program aimed toward attainment of established weight goal;Weight Maintenance: Understanding of the daily nutrition guidelines, which includes 25-35% calories from fat, 7% or less cal from saturated fats, less than 251m cholesterol, less than 1.5gm of sodium, & 5 or more servings of fruits and vegetables daily;Weight Loss: Understanding of general recommendations for a balanced deficit meal plan, which promotes 1-2 lb weight loss per week and includes a negative energy balance of [phone] kcal/d;Understanding recommendations for meals to include 15-35% energy as protein, 25-35% energy from fat, 35-60%  energy from carbohydrates, less than 2046mof dietary cholesterol, 20-35 gm of total fiber daily;Understanding of distribution of calorie intake throughout the day with the consumption of 4-5 meals/snacks    Hypertension  Yes    Intervention  Provide education on lifestyle modifcations including regular physical activity/exercise, weight management, moderate sodium restriction and increased consumption of fresh fruit, vegetables, and low fat dairy, alcohol moderation, and smoking cessation.;Monitor prescription use compliance.    Expected Outcomes  Short Term: Continued assessment and intervention until BP is < 140/9049mG in hypertensive participants. < 130/52m98m in hypertensive participants with diabetes, heart failure or chronic kidney disease.;Long Term: Maintenance of blood pressure at goal levels.    Lipids  Yes    Intervention  Provide education and support for participant on nutrition & aerobic/resistive exercise along with prescribed medications to achieve LDL <70mg81mL >40mg.78mExpected Outcomes  Short Term: Participant states understanding of desired cholesterol values and is compliant with medications prescribed. Participant is following exercise prescription and nutrition guidelines.;Long Term: Cholesterol controlled with medications as prescribed, with individualized exercise RX and with personalized nutrition plan. Value goals: LDL < 70mg, 79m> 40 mg.        Personal Goals Discharge: Goals and Risk Factor Review    Row Name 05/17/17 1113 05/21/17 0949 06/18/17 0740 07/16/17 1544 08/06/17 1508     Core Components/Risk Factors/Patient Goals Review   Personal Goals Review  Weight Management/Obesity;Hypertension;Lipids  Weight Management/Obesity;Hypertension;Lipids  Weight Management/Obesity;Hypertension;Lipids  Weight Management/Obesity;Hypertension;Lipids  Weight Management/Obesity;Hypertension;Lipids   Review  pt with CAD RF demonstrates eagerness to participate in CR activities.    pt with CAD RF demonstrates eagerness to participate in CR activities. pt BP well managed.  pt actively working towards weight loss goal.  pt reports improved endurance with stair climbing.  pt with CAD RF demonstrates eagerness to participate in CR activities. pt BP well managed.  pt actively working towards weight loss goal.  pt reports improved endurance with stair climbing.  pt with CAD RF demonstrates eagerness to participate in CR activities. pt BP well managed.  pt actively working towards weight loss goal.  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  pt cautioned against overexertion  pt with CAD RF demonstrates eagerness to participate in CR activities. pt BP well managed.  pt actively working towards weight loss goal.  pt tolerating home activities as directed by physician.    pt cautioned against overexertion   Expected Outcomes  pt will participate in CR exercise, nutrition and lifestyle education opportunities to reduce overall RF.  pt will participate in CR exercise, nutrition and lifestyle education opportunities to reduce overall RF.  pt will participate in CR exercise, nutrition and lifestyle education opportunities to reduce overall RF.  pt will participate in CR exercise, nutrition and lifestyle education opportunities to reduce overall RF.  pt will participate in CR exercise, nutrition and lifestyle education opportunities to reduce overall RF.   [place] Name 08/16/17 1640 09/20/17 8921           Core Components/Risk Factors/Patient Goals Review   Personal Goals Review  Weight Management/Obesity;Hypertension;Lipids  -      Review  pt with CAD RF demonstrates eagerness to participate in CR activities. pt BP well managed.  pt actively working towards weight loss goal.  pt tolerating home activities as directed by physician.    pt cautioned against overexertion  pt graduated from CR program with 36 sessions. pt BP well managed.  pt continues working towards weight loss goals.   pt cautioned to avoid overexertion.       Expected Outcomes  pt will participate in CR exercise, nutrition and lifestyle education opportunities to reduce overall RF.  pt will participate in exercise, nutrition and lifestyle modification opportunities to reduce overall RF.         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  -      Expected Outcomes  Achievement of increased cardiorespiratory fitness and enhanced flexibility, muscular endurance and strength shown through measurements of functional capacity and personal statement of participant.  -      Increase Strength and Stamina  Yes get back to exercise routine of sprints and push ups  -      Intervention  Provide advice, education, support and counseling about physical activity/exercise needs.;Develop an individualized exercise prescription for aerobic and resistive training based on initial evaluation findings, risk stratification, comorbidities and participant's personal goals.  -      Expected Outcomes  Achievement of increased cardiorespiratory fitness and enhanced flexibility, muscular endurance and strength shown through measurements of functional capacity and personal statement of participant.  -      Able to understand and use rate of perceived exertion (RPE) scale  Yes  -      Intervention  Provide education and explanation on how to use RPE scale  -      Expected Outcomes  Short Term: Able to use RPE daily in rehab to express subjective intensity level;Long Term:  Able to use RPE to guide intensity level when  exercising independently  -      Knowledge and understanding of Target Heart Rate Range (THRR)  Yes  Yes      Intervention  Provide education and explanation of THRR including how the numbers were predicted and where they are located for reference  -      Expected Outcomes  Short Term: Able to state/look up THRR;Short Term: Able to use daily as guideline for intensity in rehab;Long Term: Able to use THRR to govern intensity when exercising independently  -      Able to check pulse independently  Yes  -      Intervention  Provide education and demonstration on how to check pulse in carotid and radial arteries.;Review the importance of being able to check your own pulse for safety during independent exercise  -      Expected  Outcomes  Short Term: Able to explain why pulse checking is important during independent exercise;Long Term: Able to check pulse independently and accurately  -      Understanding of Exercise Prescription  Yes  -      Intervention  Provide education, explanation, and written materials on patient's individual exercise prescription  -      Expected Outcomes  Short Term: Able to explain program exercise prescription;Long Term: Able to explain home exercise prescription to exercise independently  -         Nutrition & Weight - Outcomes: Pre Biometrics - 05/02/17 1134      Pre Biometrics   Waist Circumference  38.5 inches    Hip Circumference  39 inches    Waist to Hip Ratio  0.99 %    Triceps Skinfold  14 mm    % Body Fat  26.9 %    Grip Strength  38 kg    Flexibility  121 in    Single Leg Stand  30 seconds      Post Biometrics - 08/05/17 1342       Post  Biometrics   Height  5' 7.25" (1.708 m)    Weight  180 lb 16 oz (82.1 kg)    Waist Circumference  38.5 inches    Hip Circumference  38 inches    Waist to Hip Ratio  1.01 %    BMI (Calculated)  28.14    Triceps Skinfold  14 mm    % Body Fat  26.9 %    Grip Strength  44 kg    Flexibility  14 in    Single Leg  Stand  40 seconds       Nutrition: Nutrition Therapy & Goals - 05/02/17 1217      Nutrition Therapy   Diet  Therapeutic Lifestyle Changes      Personal Nutrition Goals   Nutrition Goal  Wt loss of 1-2 lb/week to a wt loss goal of 6-8 lb at graduation from [facility], educate and counsel regarding individualized specific dietary modifications aiming towards targeted core components such as weight, hypertension, lipid management, diabetes, heart failure and other comorbidities.    Expected Outcomes  Short Term Goal: Understand basic principles of dietary content, such as calories, fat, sodium, cholesterol and nutrients.;Long Term Goal: Adherence to prescribed nutrition plan.       Nutrition Discharge: Nutrition Assessments - 09/02/17 1043      MEDFICTS Scores   Pre Score  53    Post Score  59    Score Difference  6       Education Questionnaire Score: Knowledge Questionnaire Score - 09/02/17 1622      Knowledge Questionnaire Score   Post Score  23/24       Goals reviewed with patient; copy given to patient.

## 2017-10-07 NOTE — Addendum Note (Signed)
Encounter addended by: Lowell Guitar, RN on: 10/07/2017 12:08 PM  Actions taken: Sign clinical note

## 2017-10-07 NOTE — Progress Notes (Addendum)
Discharge Progress Report  Patient Details  Name: Jordan [name] MRN: [id number] Date of Birth: 1941-01-08 Referring Provider:     CARDIAC REHAB PHASE II ORIENTATION from 05/02/2017 in [place]  Referring Provider  Glenetta Hew MD       Number of Visits: 36   Reason for Discharge:  Patient has met program and personal goals.  Smoking History:  Social History   Tobacco Use  Smoking Status Never Smoker  Smokeless Tobacco Never Used    Diagnosis:  S/P CABG x 4  ADL UCSD:   Initial Exercise Prescription: Initial Exercise Prescription - 05/02/17 1100      Date of Initial Exercise RX and Referring Provider   Date  05/02/17    Referring Provider  Glenetta Hew MD      Bike   Level  0.8    Minutes  10    METs  2.89      NuStep   Level  3    SPM  80    Minutes  10    METs  2.5      Track   Laps  11    Minutes  10    METs  2.92      Prescription Details   Frequency (times per week)  3    Duration  Progress to 30 minutes of continuous aerobic without signs/symptoms of physical distress      Intensity   THRR 40-80% of Max Heartrate  58-117    Ratings of Perceived Exertion  11-13    Perceived Dyspnea  0-4      Progression   Progression  Continue to progress workloads to maintain intensity without signs/symptoms of physical distress.      Resistance Training   Training Prescription  Yes    Weight  3lbs    Reps  10-15       Discharge Exercise Prescription (Final Exercise Prescription Changes): Exercise Prescription Changes - 09/04/17 0844      Response to Exercise   Blood Pressure (Admit)  110/60    Blood Pressure (Exercise)  158/70    Blood Pressure (Exit)  122/66    Heart Rate (Admit)  65 bpm    Heart Rate (Exercise)  115 bpm    Heart Rate (Exit)  58 bpm    Rating of Perceived Exertion (Exercise)  15    Symptoms  None    Duration  Continue with 45 min of aerobic exercise without signs/symptoms of physical  distress.    Intensity  THRR unchanged      Progression   Progression  Continue to progress workloads to maintain intensity without signs/symptoms of physical distress.    Average METs  6.7      Resistance Training   Training Prescription  Yes    Weight  7lbs    Reps  10-15    Time  10 Minutes      Interval Training   Interval Training  Yes    Equipment  Bike;NuStep      Bike   Level  2.2 HIIT Level 4.0:10.25 METs    Minutes  10    METs  6.9      NuStep   Level  6    SPM  90    Minutes  10    METs  8.7      Track   Laps  18    Minutes  10    METs  4.13  Home Exercise Plan   Plans to continue exercise at  Maintenance    Frequency  Add 3 additional days to program exercise sessions.    Initial Home Exercises Provided  05/22/17       Functional Capacity: 6 Minute Walk    Row Name 05/02/17 1133 08/05/17 1344       6 Minute Walk   Phase  Initial  Discharge    Distance  1838 feet  2200 feet    Distance % Change  -  19.7 %    Distance Feet Change  -  362 ft    Walk Time  6 minutes  6 minutes    # of Rest Breaks  0  0    MPH  3.48  4.16    METS  3.3  4.35    RPE  11  14    Perceived Dyspnea   -  0    VO2 Peak  -  15.21    Symptoms  No  No    Resting HR  65 bpm  56 bpm    Resting BP  124/60  124/78    Resting Oxygen Saturation   95 %  95 %    Exercise Oxygen Saturation  during 6 min walk  95 %  96 %    Max Ex. HR  82 bpm  94 bpm    Max Ex. BP  124/60  158/70    2 Minute Post BP  118/60  118/74       Psychological, QOL, Others - Outcomes: PHQ 2/9: Depression screen [facility] 2/9 09/02/2017 05/06/2017  Decreased Interest 0 0  Down, Depressed, Hopeless 0 0  PHQ - 2 Score 0 0    Quality of Life: Quality of Life - 09/02/17 1622      Quality of Life Scores   Health/Function Pre  26.17 %    Health/Function Post  24.83 %    Health/Function % Change  -5.12 %    Socioeconomic Pre  28.21 %    Socioeconomic Post  24.56 %    Socioeconomic % Change   -12.94 %     Psych/Spiritual Pre  25.29 %    Psych/Spiritual Post  23.86 %    Psych/Spiritual % Change  -5.65 %    Family Pre  22.8 %    Family Post  22.5 %    Family % Change  -1.32 %    GLOBAL Pre  25.91 %    GLOBAL Post  24.29 %    GLOBAL % Change  -6.25 %       Personal Goals: Goals established at orientation with interventions provided to work toward goal. Personal Goals and Risk Factors at Admission - 05/02/17 0831      Core Components/Risk Factors/Patient Goals on Admission    Weight Management  Yes;Weight Maintenance;Weight Loss    Intervention  Weight Management: Develop a combined nutrition and exercise program designed to reach desired caloric intake, while maintaining appropriate intake of nutrient and fiber, sodium and fats, and appropriate energy expenditure required for the weight goal.;Weight Management: Provide education and appropriate resources to help participant work on and attain dietary goals.;Weight Management/Obesity: Establish reasonable short term and long term weight goals.    Admit Weight  178 lb 12.7 oz (81.1 kg)    Goal Weight: Short Term  175 lb (79.4 kg)    Goal Weight: Long Term  173 lb (78.5 kg)    Expected Outcomes  Short Term:  Continue to assess and modify interventions until short term weight is achieved;Long Term: Adherence to nutrition and physical activity/exercise program aimed toward attainment of established weight goal;Weight Maintenance: Understanding of the daily nutrition guidelines, which includes 25-35% calories from fat, 7% or less cal from saturated fats, less than 281m cholesterol, less than 1.5gm of sodium, & 5 or more servings of fruits and vegetables daily;Weight Loss: Understanding of general recommendations for a balanced deficit meal plan, which promotes 1-2 lb weight loss per week and includes a negative energy balance of [phone] kcal/d;Understanding recommendations for meals to include 15-35% energy as protein, 25-35% energy from fat, 35-60%  energy from carbohydrates, less than 2023mof dietary cholesterol, 20-35 gm of total fiber daily;Understanding of distribution of calorie intake throughout the day with the consumption of 4-5 meals/snacks    Hypertension  Yes    Intervention  Provide education on lifestyle modifcations including regular physical activity/exercise, weight management, moderate sodium restriction and increased consumption of fresh fruit, vegetables, and low fat dairy, alcohol moderation, and smoking cessation.;Monitor prescription use compliance.    Expected Outcomes  Short Term: Continued assessment and intervention until BP is < 140/9046mG in hypertensive participants. < 130/2m31m in hypertensive participants with diabetes, heart failure or chronic kidney disease.;Long Term: Maintenance of blood pressure at goal levels.    Lipids  Yes    Intervention  Provide education and support for participant on nutrition & aerobic/resistive exercise along with prescribed medications to achieve LDL <70mg64mL >40mg.30mExpected Outcomes  Short Term: Participant states understanding of desired cholesterol values and is compliant with medications prescribed. Participant is following exercise prescription and nutrition guidelines.;Long Term: Cholesterol controlled with medications as prescribed, with individualized exercise RX and with personalized nutrition plan. Value goals: LDL < 70mg, 20m> 40 mg.        Personal Goals Discharge: Goals and Risk Factor Review    Row Name 05/17/17 1113 05/21/17 0949 06/18/17 0740 07/16/17 1544 08/06/17 1508     Core Components/Risk Factors/Patient Goals Review   Personal Goals Review  Weight Management/Obesity;Hypertension;Lipids  Weight Management/Obesity;Hypertension;Lipids  Weight Management/Obesity;Hypertension;Lipids  Weight Management/Obesity;Hypertension;Lipids  Weight Management/Obesity;Hypertension;Lipids   Review  pt with CAD RF demonstrates eagerness to participate in CR activities.    pt with CAD RF demonstrates eagerness to participate in CR activities. pt BP well managed.  pt actively working towards weight loss goal.  pt reports improved endurance with stair climbing.  pt with CAD RF demonstrates eagerness to participate in CR activities. pt BP well managed.  pt actively working towards weight loss goal.  pt reports improved endurance with stair climbing.  pt with CAD RF demonstrates eagerness to participate in CR activities. pt BP well managed.  pt actively working towards weight loss goal.  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  pt cautioned against overexertion  pt with CAD RF demonstrates eagerness to participate in CR activities. pt BP well managed.  pt actively working towards weight loss goal.  pt tolerating home activities as directed by physician.    pt cautioned against overexertion   Expected Outcomes  pt will participate in CR exercise, nutrition and lifestyle education opportunities to reduce overall RF.  pt will participate in CR exercise, nutrition and lifestyle education opportunities to reduce overall RF.  pt will participate in CR exercise, nutrition and lifestyle education opportunities to reduce overall RF.  pt will participate in CR exercise, nutrition and lifestyle education opportunities to reduce overall RF.  pt will participate in CR exercise, nutrition and lifestyle education opportunities to reduce overall RF.   [place] Name 08/16/17 1640 09/20/17 6387           Core Components/Risk Factors/Patient Goals Review   Personal Goals Review  Weight Management/Obesity;Hypertension;Lipids  -      Review  pt with CAD RF demonstrates eagerness to participate in CR activities. pt BP well managed.  pt actively working towards weight loss goal.  pt tolerating home activities as directed by physician.    pt cautioned against overexertion  pt graduated from CR program with 36 sessions. pt BP well managed.  pt continues working towards weight loss goals.   pt cautioned to avoid overexertion.       Expected Outcomes  pt will participate in CR exercise, nutrition and lifestyle education opportunities to reduce overall RF.  pt will participate in exercise, nutrition and lifestyle modification opportunities to reduce overall RF.         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  -      Expected Outcomes  Achievement of increased cardiorespiratory fitness and enhanced flexibility, muscular endurance and strength shown through measurements of functional capacity and personal statement of participant.  -      Increase Strength and Stamina  Yes get back to exercise routine of sprints and push ups  -      Intervention  Provide advice, education, support and counseling about physical activity/exercise needs.;Develop an individualized exercise prescription for aerobic and resistive training based on initial evaluation findings, risk stratification, comorbidities and participant's personal goals.  -      Expected Outcomes  Achievement of increased cardiorespiratory fitness and enhanced flexibility, muscular endurance and strength shown through measurements of functional capacity and personal statement of participant.  -      Able to understand and use rate of perceived exertion (RPE) scale  Yes  -      Intervention  Provide education and explanation on how to use RPE scale  -      Expected Outcomes  Short Term: Able to use RPE daily in rehab to express subjective intensity level;Long Term:  Able to use RPE to guide intensity level when  exercising independently  -      Knowledge and understanding of Target Heart Rate Range (THRR)  Yes  Yes      Intervention  Provide education and explanation of THRR including how the numbers were predicted and where they are located for reference  -      Expected Outcomes  Short Term: Able to state/look up THRR;Short Term: Able to use daily as guideline for intensity in rehab;Long Term: Able to use THRR to govern intensity when exercising independently  -      Able to check pulse independently  Yes  -      Intervention  Provide education and demonstration on how to check pulse in carotid and radial arteries.;Review the importance of being able to check your own pulse for safety during independent exercise  -      Expected  Outcomes  Short Term: Able to explain why pulse checking is important during independent exercise;Long Term: Able to check pulse independently and accurately  -      Understanding of Exercise Prescription  Yes  -      Intervention  Provide education, explanation, and written materials on patient's individual exercise prescription  -      Expected Outcomes  Short Term: Able to explain program exercise prescription;Long Term: Able to explain home exercise prescription to exercise independently  -         Nutrition & Weight - Outcomes: Pre Biometrics - 05/02/17 1134      Pre Biometrics   Waist Circumference  38.5 inches    Hip Circumference  39 inches    Waist to Hip Ratio  0.99 %    Triceps Skinfold  14 mm    % Body Fat  26.9 %    Grip Strength  38 kg    Flexibility  121 in    Single Leg Stand  30 seconds      Post Biometrics - 08/05/17 1342       Post  Biometrics   Height  5' 7.25" (1.708 m)    Weight  180 lb 16 oz (82.1 kg)    Waist Circumference  38.5 inches    Hip Circumference  38 inches    Waist to Hip Ratio  1.01 %    BMI (Calculated)  28.14    Triceps Skinfold  14 mm    % Body Fat  26.9 %    Grip Strength  44 kg    Flexibility  14 in    Single Leg  Stand  40 seconds       Nutrition: Nutrition Therapy & Goals - 05/02/17 1217      Nutrition Therapy   Diet  Therapeutic Lifestyle Changes      Personal Nutrition Goals   Nutrition Goal  Wt loss of 1-2 lb/week to a wt loss goal of 6-8 lb at graduation from [facility], educate and counsel regarding individualized specific dietary modifications aiming towards targeted core components such as weight, hypertension, lipid management, diabetes, heart failure and other comorbidities.    Expected Outcomes  Short Term Goal: Understand basic principles of dietary content, such as calories, fat, sodium, cholesterol and nutrients.;Long Term Goal: Adherence to prescribed nutrition plan.       Nutrition Discharge: Nutrition Assessments - 09/02/17 1043      MEDFICTS Scores   Pre Score  53    Post Score  59    Score Difference  6       Education Questionnaire Score: Knowledge Questionnaire Score - 09/02/17 1622      Knowledge Questionnaire Score   Post Score  23/24       Goals reviewed with patient; copy given to patient.

## 2017-10-20 ENCOUNTER — Other Ambulatory Visit: Payer: Self-pay | Admitting: Cardiology

## 2018-01-16 ENCOUNTER — Other Ambulatory Visit: Payer: Self-pay

## 2018-01-16 DIAGNOSIS — R590 Localized enlarged lymph nodes: Secondary | ICD-10-CM

## 2018-01-20 ENCOUNTER — Inpatient Hospital Stay: Admission: RE | Admit: 2018-01-20 | Payer: Medicare Other | Source: Ambulatory Visit

## 2018-01-28 ENCOUNTER — Other Ambulatory Visit (INDEPENDENT_AMBULATORY_CARE_PROVIDER_SITE_OTHER): Payer: Medicare Other

## 2018-01-28 ENCOUNTER — Ambulatory Visit (INDEPENDENT_AMBULATORY_CARE_PROVIDER_SITE_OTHER)
Admission: RE | Admit: 2018-01-28 | Discharge: 2018-01-28 | Disposition: A | Discharge status: Home / Self Care | Payer: Medicare Other | Source: Ambulatory Visit | Attending: Internal Medicine | Admitting: Internal Medicine

## 2018-01-28 DIAGNOSIS — R0602 Shortness of breath: Secondary | ICD-10-CM | POA: Diagnosis not present

## 2018-01-28 DIAGNOSIS — R590 Localized enlarged lymph nodes: Secondary | ICD-10-CM

## 2018-01-28 DIAGNOSIS — R0609 Other forms of dyspnea: Secondary | ICD-10-CM | POA: Diagnosis not present

## 2018-01-28 LAB — BASIC METABOLIC PANEL
BUN: 21 mg/dL (ref 6–23)
CHLORIDE: 105 meq/L (ref 96–112)
CO2: 28 mEq/L (ref 19–32)
Calcium: 9.6 mg/dL (ref 8.4–10.5)
Creatinine, Ser: 1 mg/dL (ref 0.40–1.50)
GFR: 77.42 mL/min (ref >60.00)
Glucose, Bld: 110 mg/dL — ABNORMAL HIGH (ref 70–99)
POTASSIUM: 4.7 meq/L (ref 3.5–5.1)
SODIUM: 141 meq/L (ref 135–145)

## 2018-01-28 IMAGING — CT CT CHEST W/ CM
2 of 3 series · 15 of 36 positions shown, 18 images · IV contrast (ISOVUE 300)
Comparison: Plain films [DATE]. Chest CT [DATE].. Chest CT
[DATE] also reviewed.

CLINICAL DATA: Shortness of breath with exertion after CABG in
[DATE]. Mediastinal adenopathy.

EXAM:
CT CHEST WITH CONTRAST
TECHNIQUE: Multidetector CT imaging of the chest was performed during
intravenous contrast administration.
CONTRAST:  80mL [JW] IOPAMIDOL ([JW]) INJECTION 61%

[Series 2: thorax · axial · 0.83mm/px · z∈[-323,-35]mm · 12 of 170 slices shown, 15 images]
[im 13/170  mediastinal]
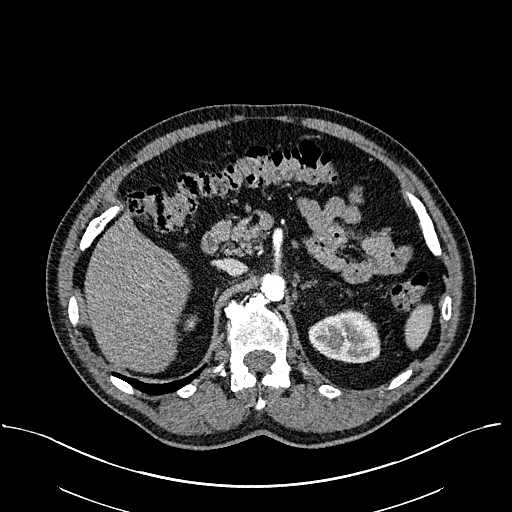
[im 13/170  lung]
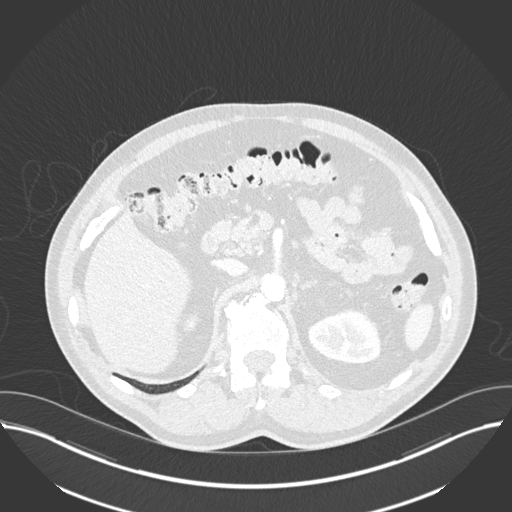
[im 26/170  lung]
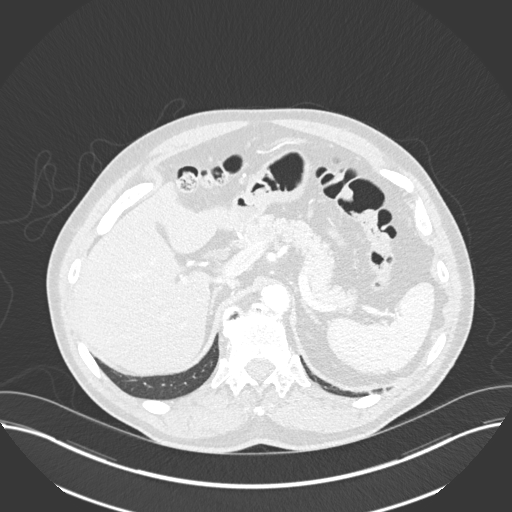
[im 38/170  lung]
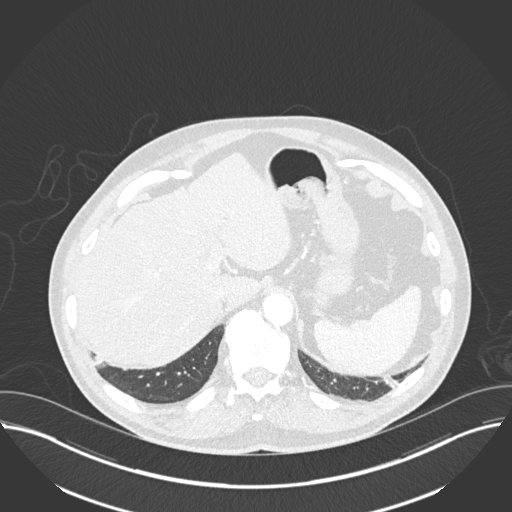
[im 51/170  lung]
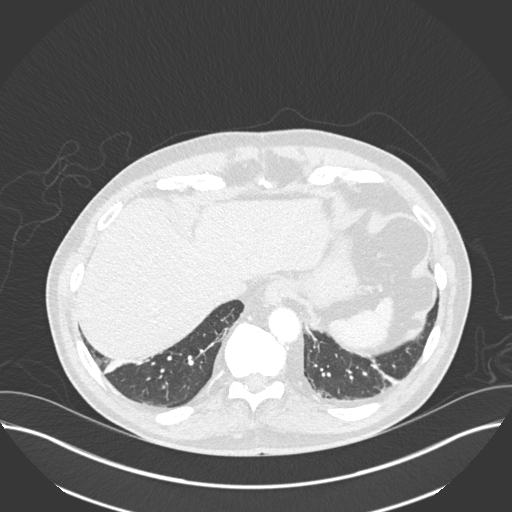
[im 63/170  mediastinal]
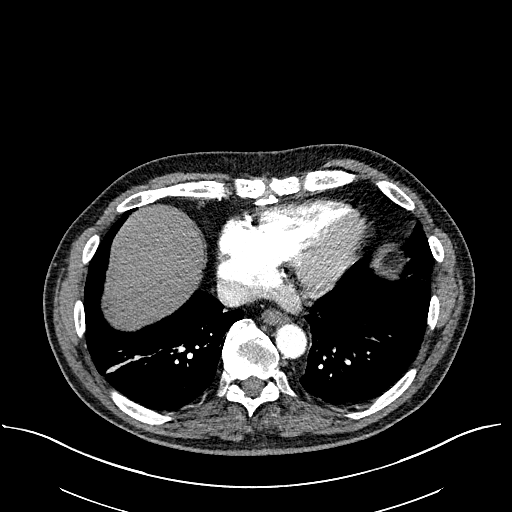
[im 63/170  lung]
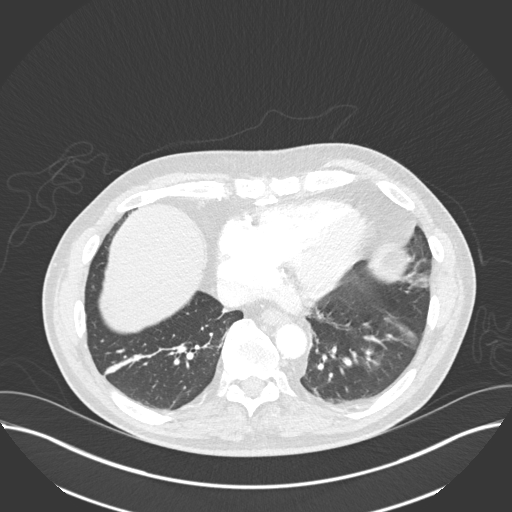
[im 76/170  lung]
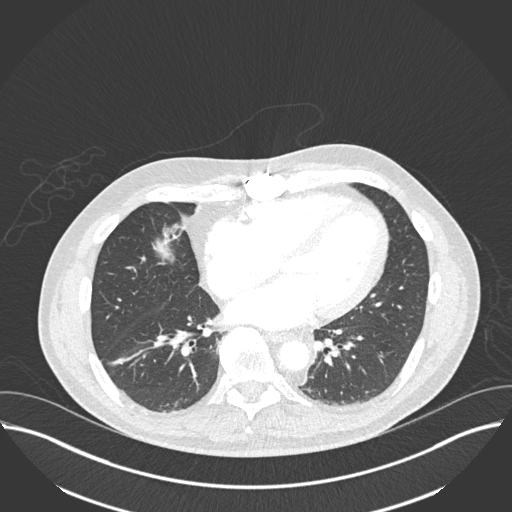
[im 94/170  lung]
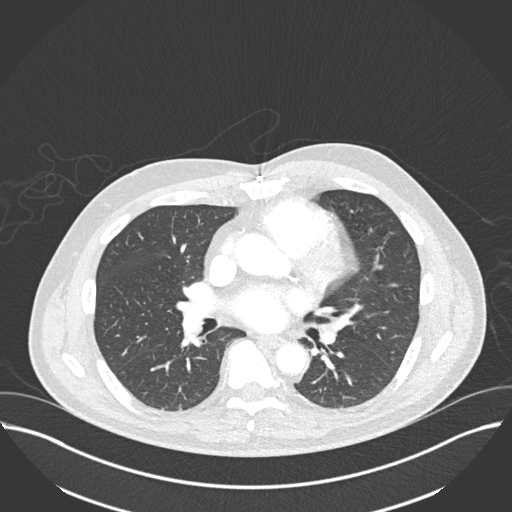
[im 107/170  lung]
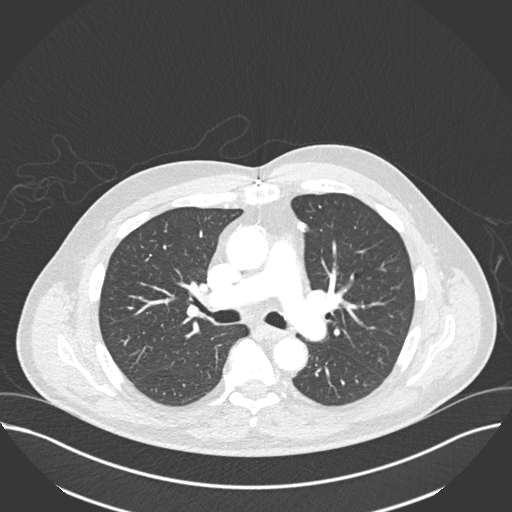
[im 119/170  mediastinal]
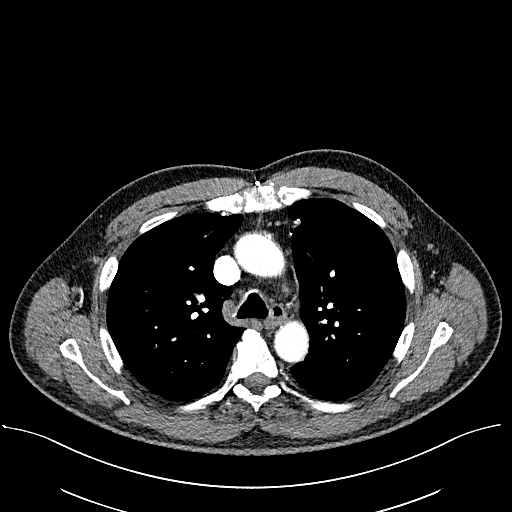
[im 119/170  lung]
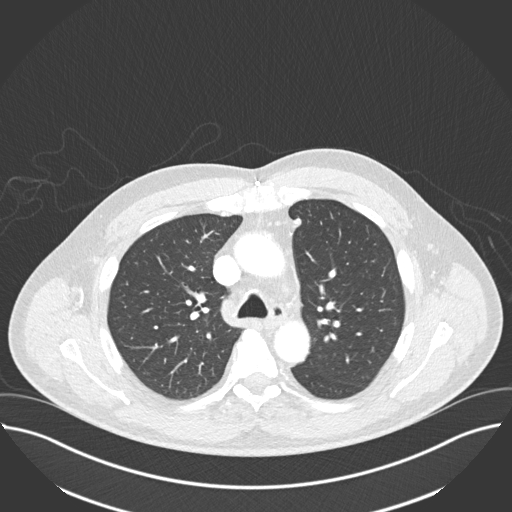
[im 132/170  lung]
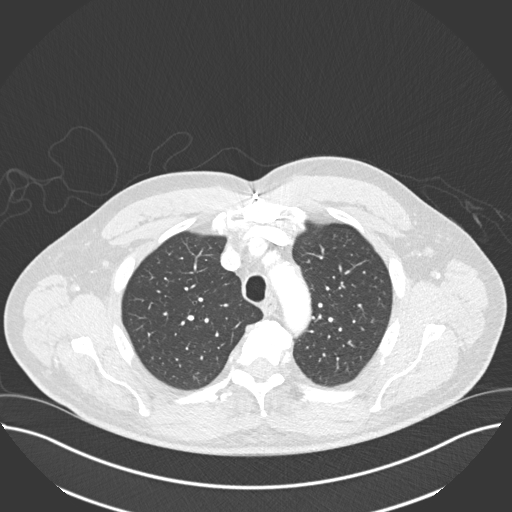
[im 144/170  lung]
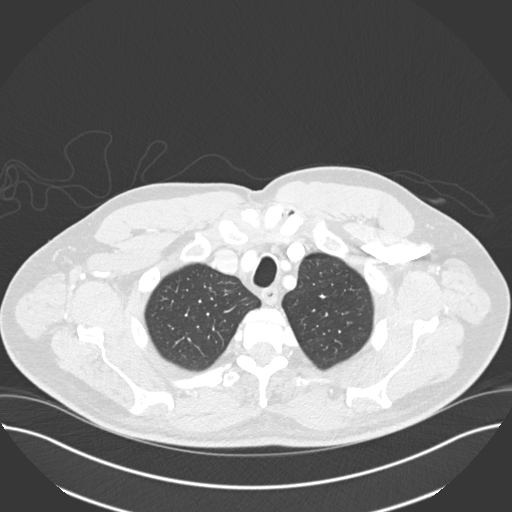
[im 157/170  lung]
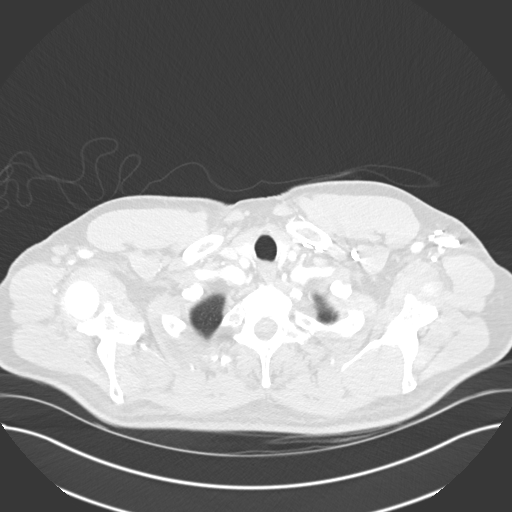

[Series 5: coronal · coronal · 0.66mm/px · 3 of 120 slices shown]
[im 24/120  lung]
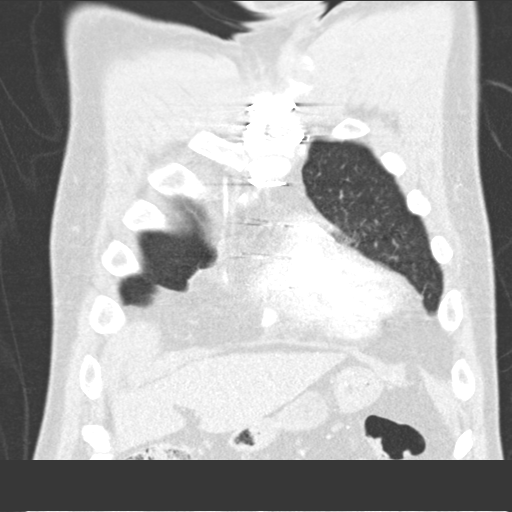
[im 48/120  lung]
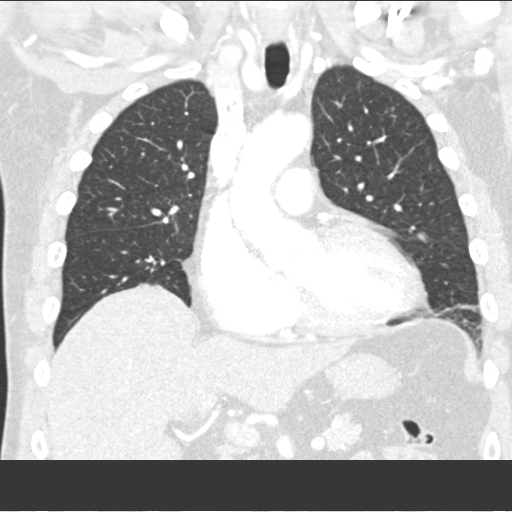
[im 72/120  lung]
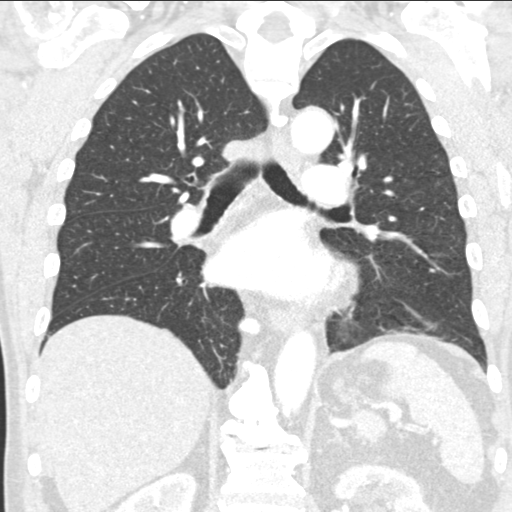

[15 of 36 positions shown; findings below may reference images not displayed]

FINDINGS: Cardiovascular: Aortic and branch vessel atherosclerosis. Mild
cardiomegaly, without pericardial effusion. No central pulmonary
embolism, on this non-dedicated study.

Mediastinum/Nodes: No supraclavicular adenopathy. Low right
paratracheal/precarinal node measures 2.3 x 2.2 cm on image 60/2.
Minimally enlarged from 1.9 x 1.9 cm on [DATE]. No hilar
adenopathy.

Lungs/Pleura: No pleural fluid.  Bibasilar scarring.

Upper Abdomen: Normal imaged portions of the liver, spleen, stomach,
pancreas, gallbladder, biliary tract, adrenal glands, kidneys.

Musculoskeletal: Prior median sternotomy.
IMPRESSION: 1. No acute process in the chest. No explanation for shortness of
breath.
2. Slight enlargement of a precarinal node since [DATE]. This
favors a benign/reactive etiology. One further follow-up at 6 months
could be performed.
3.  Aortic Atherosclerosis ([JW]-[JW]).

## 2018-01-28 MED ORDER — IOPAMIDOL (ISOVUE-300) INJECTION 61%
80.0000 mL | Freq: Once | INTRAVENOUS | Status: AC | PRN
  Administered 2018-01-28: 80 mL via INTRAVENOUS

## 2018-01-30 ENCOUNTER — Telehealth: Payer: Self-pay | Admitting: Internal Medicine

## 2018-01-30 DIAGNOSIS — R918 Other nonspecific abnormal finding of lung field: Secondary | ICD-10-CM

## 2018-01-30 NOTE — Telephone Encounter (Signed)
Pt is calling back   [phone]

## 2018-01-30 NOTE — Telephone Encounter (Signed)
Jordan [name]  Let Rosebud Poles Hlavacek lknow that there is slight enlargement in mediastinal node compared to  1 year ago but is so slight that radiology thinks is still benign but to be on safe side recommending another CT chest with contrast in 6 months  Plan  - repeat ct chest with contrast for mediastinal adenopathy in 6 months - ROV in 6 months to discusss    Dr. Brand Males, M.D., [facility]C.P Pulmonary and Critical Care Medicine Staff Physician, [place] Director - Interstitial Lung Disease  Program  Pulmonary [place] at [place], [place], [address]  Pager: [phone], If no answer or between  15:00h - 7:00h: call [phone] Telephone: [phone]     Ct Chest W Contrast  Result Date: 01/28/2018 CLINICAL DATA:  Shortness of breath with exertion after CABG in August of 2018. Mediastinal adenopathy. EXAM: CT CHEST WITH CONTRAST TECHNIQUE: Multidetector CT imaging of the chest was performed during intravenous contrast administration. CONTRAST:  86mL ISOVUE-300 IOPAMIDOL (ISOVUE-300) INJECTION 61% COMPARISON:  Plain films 04/24/2017. Chest CT 03/04/2017. Chest CT 01/02/2017 also reviewed. FINDINGS: Cardiovascular: Aortic and branch vessel atherosclerosis. Mild cardiomegaly, without pericardial effusion. No central pulmonary embolism, on this non-dedicated study. Mediastinum/Nodes: No supraclavicular adenopathy. Low right paratracheal/precarinal node measures 2.3 x 2.2 cm on image 60/2. Minimally enlarged from 1.9 x 1.9 cm on 01/02/2017. No hilar adenopathy. Lungs/Pleura: No pleural fluid.  Bibasilar scarring. Upper Abdomen: Normal imaged portions of the liver, spleen, stomach, pancreas, gallbladder, biliary tract, adrenal glands, kidneys. Musculoskeletal: Prior median sternotomy. IMPRESSION: 1. No acute process in the chest. No explanation for shortness of breath. 2. Slight enlargement of a precarinal node since 01/02/2017.  This favors a benign/reactive etiology. One further follow-up at 6 months could be performed. 3.  Aortic Atherosclerosis (ICD10-I70.0). Electronically Signed   By: Abigail Miyamoto M.D.   On: 01/28/2018 14:55

## 2018-01-30 NOTE — Telephone Encounter (Signed)
Will route to Jordan [name].  

## 2018-01-30 NOTE — Telephone Encounter (Signed)
Called patient, unable to reach left message to give us a call back. 

## 2018-01-30 NOTE — Telephone Encounter (Signed)
Called and spoke with patient, he is aware of results and verbalized understanding. Nothing further needed. Order placed as well as recall.

## 2018-03-17 DIAGNOSIS — E7849 Other hyperlipidemia: Secondary | ICD-10-CM | POA: Diagnosis not present

## 2018-03-17 DIAGNOSIS — Z125 Encounter for screening for malignant neoplasm of prostate: Secondary | ICD-10-CM | POA: Diagnosis not present

## 2018-03-17 DIAGNOSIS — R7301 Impaired fasting glucose: Secondary | ICD-10-CM | POA: Diagnosis not present

## 2018-03-17 DIAGNOSIS — I10 Essential (primary) hypertension: Secondary | ICD-10-CM | POA: Diagnosis not present

## 2018-03-31 DIAGNOSIS — Z0000 Encounter for general adult medical examination without abnormal findings: Secondary | ICD-10-CM | POA: Diagnosis not present

## 2018-03-31 DIAGNOSIS — I2510 Atherosclerotic heart disease of native coronary artery without angina pectoris: Secondary | ICD-10-CM | POA: Diagnosis not present

## 2018-03-31 DIAGNOSIS — Z6828 Body mass index (BMI) 28.0-28.9, adult: Secondary | ICD-10-CM | POA: Diagnosis not present

## 2018-03-31 DIAGNOSIS — R3129 Other microscopic hematuria: Secondary | ICD-10-CM | POA: Diagnosis not present

## 2018-03-31 DIAGNOSIS — H9193 Unspecified hearing loss, bilateral: Secondary | ICD-10-CM | POA: Diagnosis not present

## 2018-03-31 DIAGNOSIS — Z1389 Encounter for screening for other disorder: Secondary | ICD-10-CM | POA: Diagnosis not present

## 2018-03-31 DIAGNOSIS — I10 Essential (primary) hypertension: Secondary | ICD-10-CM | POA: Diagnosis not present

## 2018-03-31 DIAGNOSIS — Q791 Other congenital malformations of diaphragm: Secondary | ICD-10-CM | POA: Diagnosis not present

## 2018-03-31 DIAGNOSIS — I6529 Occlusion and stenosis of unspecified carotid artery: Secondary | ICD-10-CM | POA: Diagnosis not present

## 2018-03-31 DIAGNOSIS — R599 Enlarged lymph nodes, unspecified: Secondary | ICD-10-CM | POA: Diagnosis not present

## 2018-03-31 DIAGNOSIS — E7849 Other hyperlipidemia: Secondary | ICD-10-CM | POA: Diagnosis not present

## 2018-03-31 DIAGNOSIS — J479 Bronchiectasis, uncomplicated: Secondary | ICD-10-CM | POA: Diagnosis not present

## 2018-04-04 DIAGNOSIS — Z1212 Encounter for screening for malignant neoplasm of rectum: Secondary | ICD-10-CM | POA: Diagnosis not present

## 2018-04-04 DIAGNOSIS — M461 Sacroiliitis, not elsewhere classified: Secondary | ICD-10-CM | POA: Diagnosis not present

## 2018-04-04 DIAGNOSIS — M545 Low back pain: Secondary | ICD-10-CM | POA: Diagnosis not present

## 2018-04-04 DIAGNOSIS — M9904 Segmental and somatic dysfunction of sacral region: Secondary | ICD-10-CM | POA: Diagnosis not present

## 2018-04-04 DIAGNOSIS — M9903 Segmental and somatic dysfunction of lumbar region: Secondary | ICD-10-CM | POA: Diagnosis not present

## 2018-04-10 DIAGNOSIS — M9903 Segmental and somatic dysfunction of lumbar region: Secondary | ICD-10-CM | POA: Diagnosis not present

## 2018-04-10 DIAGNOSIS — M461 Sacroiliitis, not elsewhere classified: Secondary | ICD-10-CM | POA: Diagnosis not present

## 2018-04-10 DIAGNOSIS — M9904 Segmental and somatic dysfunction of sacral region: Secondary | ICD-10-CM | POA: Diagnosis not present

## 2018-04-10 DIAGNOSIS — M545 Low back pain: Secondary | ICD-10-CM | POA: Diagnosis not present

## 2018-04-16 DIAGNOSIS — M545 Low back pain: Secondary | ICD-10-CM | POA: Diagnosis not present

## 2018-04-16 DIAGNOSIS — M9903 Segmental and somatic dysfunction of lumbar region: Secondary | ICD-10-CM | POA: Diagnosis not present

## 2018-04-16 DIAGNOSIS — M461 Sacroiliitis, not elsewhere classified: Secondary | ICD-10-CM | POA: Diagnosis not present

## 2018-04-16 DIAGNOSIS — M9904 Segmental and somatic dysfunction of sacral region: Secondary | ICD-10-CM | POA: Diagnosis not present

## 2018-05-01 DIAGNOSIS — Z23 Encounter for immunization: Secondary | ICD-10-CM | POA: Diagnosis not present

## 2018-05-08 DIAGNOSIS — H01024 Squamous blepharitis left upper eyelid: Secondary | ICD-10-CM | POA: Diagnosis not present

## 2018-05-08 DIAGNOSIS — H01021 Squamous blepharitis right upper eyelid: Secondary | ICD-10-CM | POA: Diagnosis not present

## 2018-05-08 DIAGNOSIS — H01025 Squamous blepharitis left lower eyelid: Secondary | ICD-10-CM | POA: Diagnosis not present

## 2018-05-08 DIAGNOSIS — D3132 Benign neoplasm of left choroid: Secondary | ICD-10-CM | POA: Diagnosis not present

## 2018-05-08 DIAGNOSIS — H04123 Dry eye syndrome of bilateral lacrimal glands: Secondary | ICD-10-CM | POA: Diagnosis not present

## 2018-05-08 DIAGNOSIS — H01022 Squamous blepharitis right lower eyelid: Secondary | ICD-10-CM | POA: Diagnosis not present

## 2018-05-08 DIAGNOSIS — H2513 Age-related nuclear cataract, bilateral: Secondary | ICD-10-CM | POA: Diagnosis not present

## 2018-05-08 DIAGNOSIS — H10413 Chronic giant papillary conjunctivitis, bilateral: Secondary | ICD-10-CM | POA: Diagnosis not present

## 2018-06-23 DIAGNOSIS — M545 Low back pain: Secondary | ICD-10-CM | POA: Diagnosis not present

## 2018-06-23 DIAGNOSIS — M5136 Other intervertebral disc degeneration, lumbar region: Secondary | ICD-10-CM | POA: Diagnosis not present

## 2018-06-23 DIAGNOSIS — Z6829 Body mass index (BMI) 29.0-29.9, adult: Secondary | ICD-10-CM | POA: Diagnosis not present

## 2018-07-15 DIAGNOSIS — L430 Hypertrophic lichen planus: Secondary | ICD-10-CM | POA: Diagnosis not present

## 2018-07-15 DIAGNOSIS — L812 Freckles: Secondary | ICD-10-CM | POA: Diagnosis not present

## 2018-07-15 DIAGNOSIS — D485 Neoplasm of uncertain behavior of skin: Secondary | ICD-10-CM | POA: Diagnosis not present

## 2018-07-15 DIAGNOSIS — L820 Inflamed seborrheic keratosis: Secondary | ICD-10-CM | POA: Diagnosis not present

## 2018-07-15 DIAGNOSIS — L570 Actinic keratosis: Secondary | ICD-10-CM | POA: Diagnosis not present

## 2018-07-15 DIAGNOSIS — D1801 Hemangioma of skin and subcutaneous tissue: Secondary | ICD-10-CM | POA: Diagnosis not present

## 2018-07-15 DIAGNOSIS — Z85828 Personal history of other malignant neoplasm of skin: Secondary | ICD-10-CM | POA: Diagnosis not present

## 2018-07-15 DIAGNOSIS — L821 Other seborrheic keratosis: Secondary | ICD-10-CM | POA: Diagnosis not present

## 2018-07-28 ENCOUNTER — Other Ambulatory Visit: Payer: Medicare Other

## 2018-07-29 ENCOUNTER — Other Ambulatory Visit: Payer: Self-pay | Admitting: Cardiology

## 2018-08-04 ENCOUNTER — Other Ambulatory Visit (INDEPENDENT_AMBULATORY_CARE_PROVIDER_SITE_OTHER): Payer: Medicare Other

## 2018-08-04 ENCOUNTER — Other Ambulatory Visit: Payer: Self-pay | Admitting: *Deleted

## 2018-08-04 ENCOUNTER — Other Ambulatory Visit: Payer: Self-pay

## 2018-08-04 DIAGNOSIS — R918 Other nonspecific abnormal finding of lung field: Secondary | ICD-10-CM

## 2018-08-04 LAB — BASIC METABOLIC PANEL
BUN: 20 mg/dL (ref 6–23)
CHLORIDE: 106 meq/L (ref 96–112)
CO2: 28 meq/L (ref 19–32)
Calcium: 8.9 mg/dL (ref 8.4–10.5)
Creatinine, Ser: 0.77 mg/dL (ref 0.40–1.50)
GFR: 104.53 mL/min (ref >60.00)
GLUCOSE: 95 mg/dL (ref 70–99)
POTASSIUM: 4 meq/L (ref 3.5–5.1)
Sodium: 139 mEq/L (ref 135–145)

## 2018-08-04 NOTE — Progress Notes (Unsigned)
Per Chantel, due to pt having CT with contrast, pt will need to get labs done prior.  Order has been placed for pt to have BMET done stat. Attempted to call pt but unable to get him.  Left message for pt to return call.  Tried to call other number listed for pt and again unable to reach pt. Left message at that number as well.

## 2018-08-05 ENCOUNTER — Ambulatory Visit (INDEPENDENT_AMBULATORY_CARE_PROVIDER_SITE_OTHER)
Admission: RE | Admit: 2018-08-05 | Discharge: 2018-08-05 | Disposition: A | Discharge status: Home / Self Care | Payer: Medicare Other | Source: Ambulatory Visit | Attending: Internal Medicine | Admitting: Internal Medicine

## 2018-08-05 DIAGNOSIS — R918 Other nonspecific abnormal finding of lung field: Secondary | ICD-10-CM | POA: Diagnosis not present

## 2018-08-05 DIAGNOSIS — R590 Localized enlarged lymph nodes: Secondary | ICD-10-CM | POA: Diagnosis not present

## 2018-08-05 IMAGING — CT CT CHEST W/ CM
2 of 3 series · 15 of 36 positions shown, 18 images · IV contrast (ISOVUE 300)
Comparison: Chest CT [DATE] and [DATE]

CLINICAL DATA: Followup borderline enlarged mediastinal lymph node.

EXAM:
CT CHEST WITH CONTRAST
TECHNIQUE: Multidetector CT imaging of the chest was performed during
intravenous contrast administration.
CONTRAST:  80mL [V1] IOPAMIDOL ([V1]) INJECTION 61%

[Series 2: thorax · axial · 0.74mm/px · z∈[+1108,+1408]mm · 12 of 178 slices shown, 15 images]
[im 14/178  mediastinal]
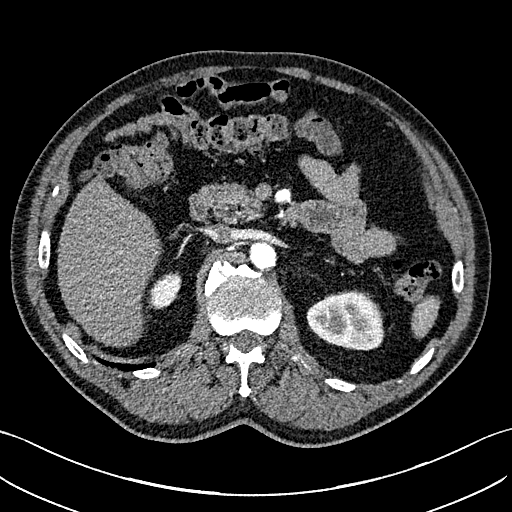
[im 14/178  lung]
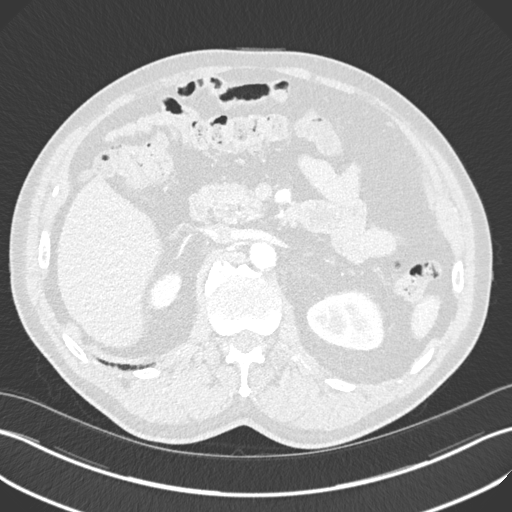
[im 27/178  lung]
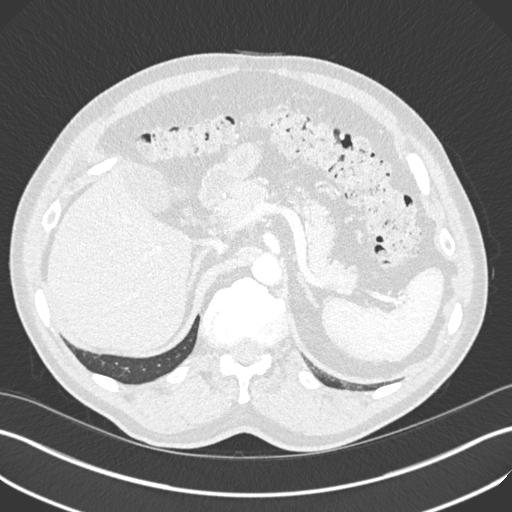
[im 40/178  lung]
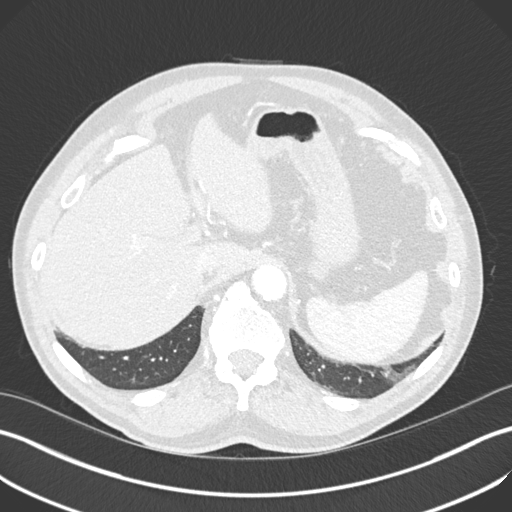
[im 53/178  lung]
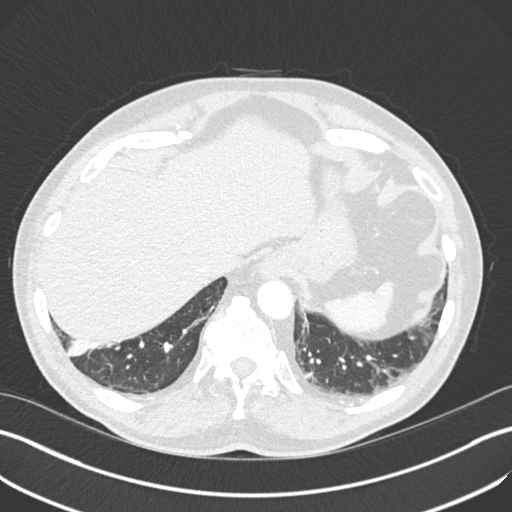
[im 66/178  mediastinal]
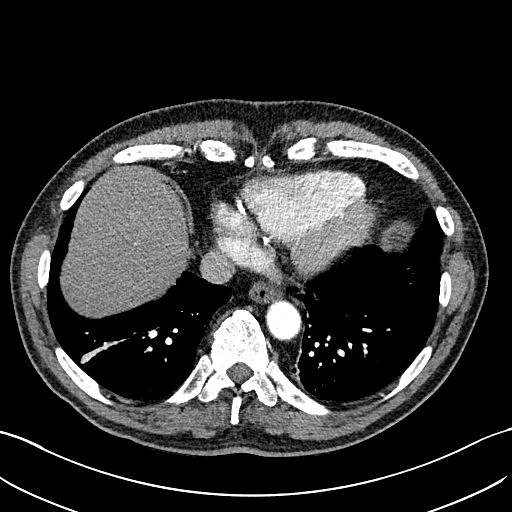
[im 66/178  lung]
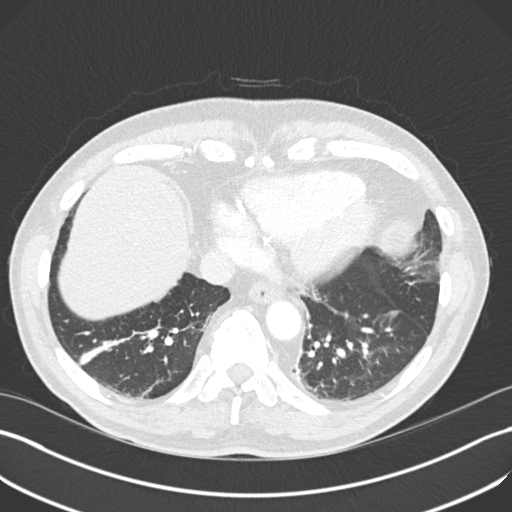
[im 79/178  lung]
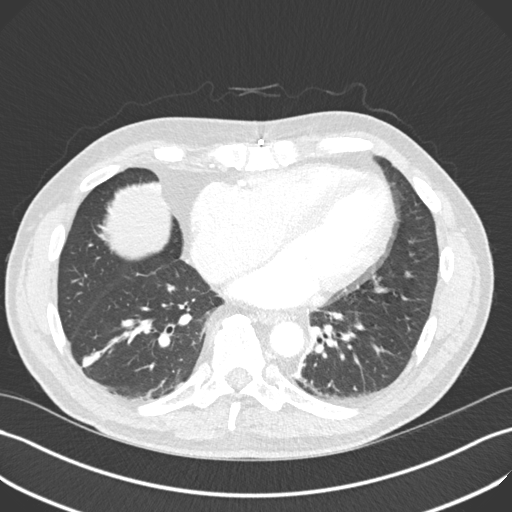
[im 99/178  lung]
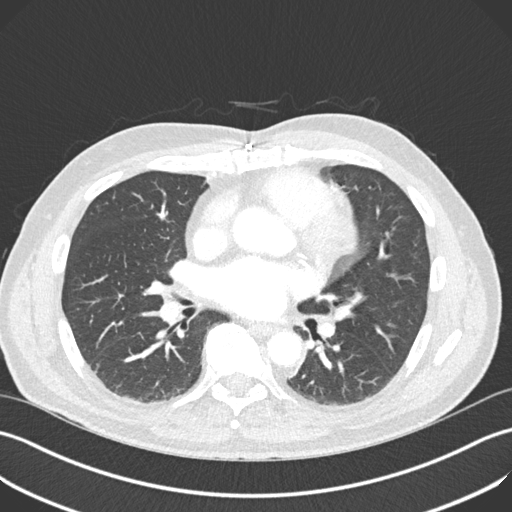
[im 112/178  lung]
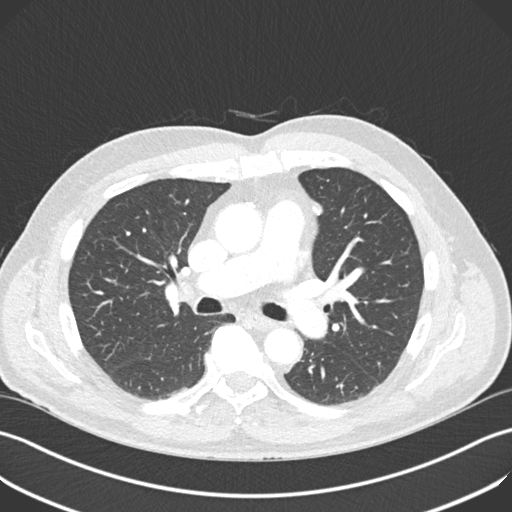
[im 125/178  mediastinal]
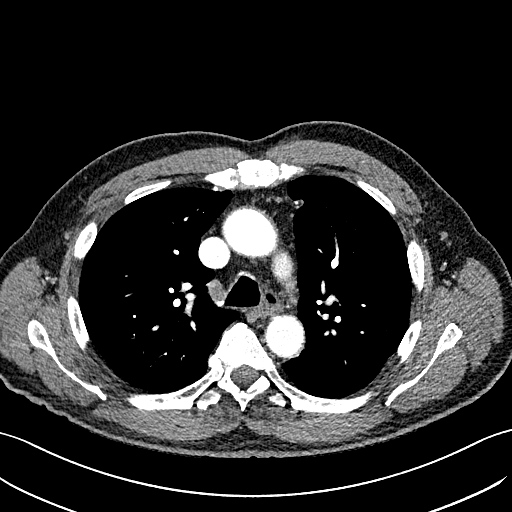
[im 125/178  lung]
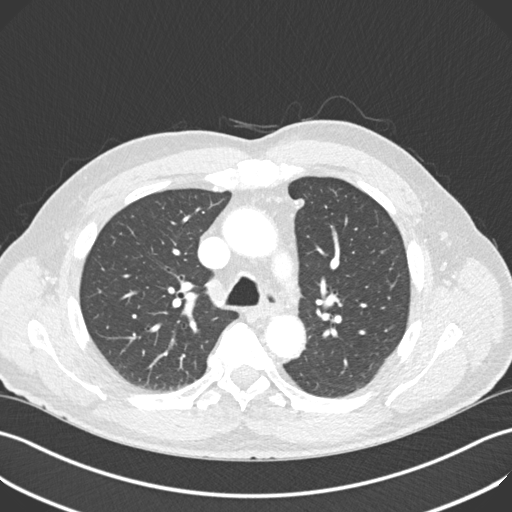
[im 138/178  lung]
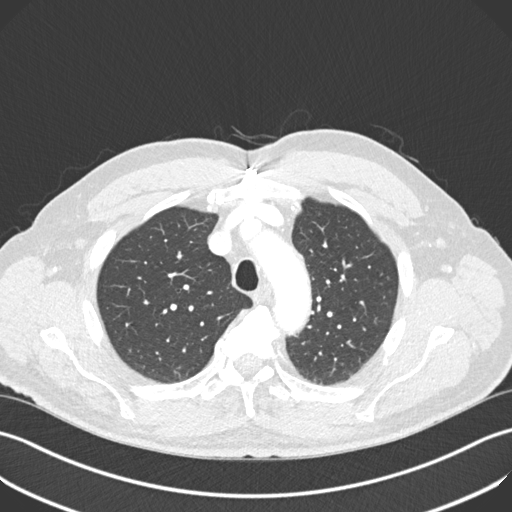
[im 151/178  lung]
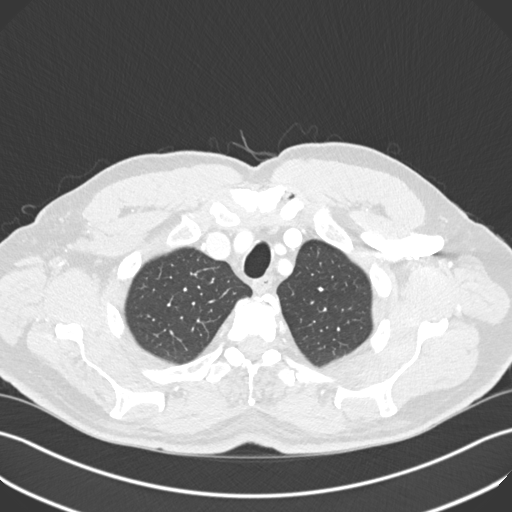
[im 164/178  lung]
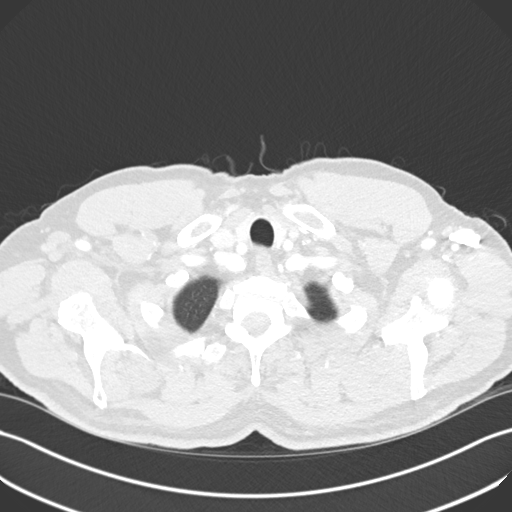

[Series 5: coronal · coronal · 0.69mm/px · 3 of 126 slices shown]
[im 26/126  lung]
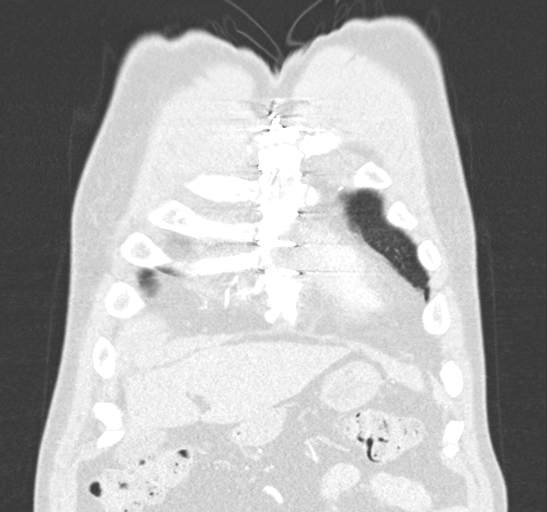
[im 51/126  lung]
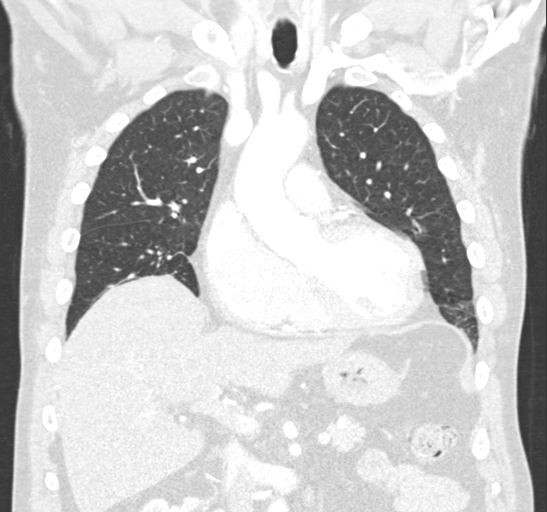
[im 76/126  lung]
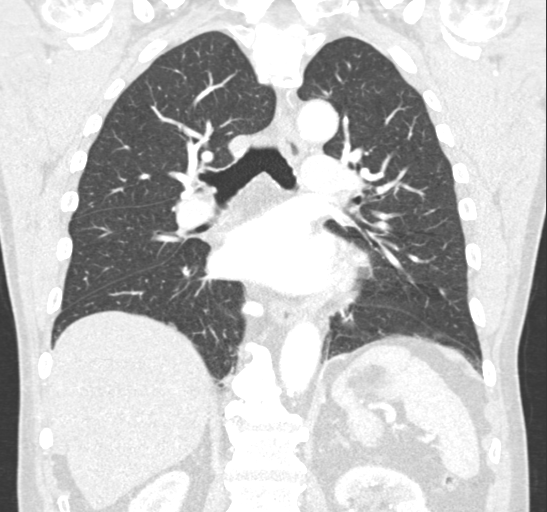

[15 of 36 positions shown; findings below may reference images not displayed]

FINDINGS: Cardiovascular: The heart is mildly enlarged but stable. No
pericardial effusion. Stable mild tortuosity and calcification of
the thoracic aorta and stable surgical changes from coronary artery
bypass surgery. Moderate atherosclerotic calcifications noted at the
origin of the left subclavian artery. There also dense and extensive
three-vessel coronary artery calcifications.

Mediastinum/Nodes: The precarinal low-attenuation lymph node is much
smaller. It measures 13.5 x 9 mm and previously measured 23 x 22 mm.
Other small scattered lymph nodes are stable. No new or progressive
findings. The esophagus is grossly normal.

Lungs/Pleura: Stable emphysematous changes. No infiltrates, edema or
effusions. Stable bibasilar scarring changes. No worrisome pulmonary
lesions or pulmonary nodules.

Upper Abdomen: No significant upper abdominal findings. No hepatic
or adrenal gland lesions.

Musculoskeletal: No significant bony findings. Findings suggest
DISH.
IMPRESSION: 1. Interval decrease in size in the precarinal lymph node. No new or
progressive findings.
2. Stable emphysematous changes but no acute pulmonary findings or
worrisome pulmonary lesions.
3. Stable surgical changes from coronary artery bypass surgery.
4. Stable cardiac enlargement.
5. Bibasilar scarring changes.

Aortic Atherosclerosis ([V1]-[V1]) and Emphysema ([V1]-[V1]).

## 2018-08-05 MED ORDER — IOPAMIDOL (ISOVUE-300) INJECTION 61%
80.0000 mL | Freq: Once | INTRAVENOUS | Status: AC | PRN
  Administered 2018-08-05: 80 mL via INTRAVENOUS

## 2018-08-10 ENCOUNTER — Encounter: Payer: Self-pay | Admitting: Internal Medicine

## 2018-08-28 ENCOUNTER — Encounter: Payer: Self-pay | Admitting: Internal Medicine

## 2018-09-08 ENCOUNTER — Telehealth: Payer: Self-pay | Admitting: Internal Medicine

## 2018-09-08 NOTE — Telephone Encounter (Signed)
   Let Lydell Moga Lindfors  know that DEc 2019 CT shows decreae in mediastinal nodes  Plan Give followup to disuss  Thanks  MR    IMPRESSION: 1. Interval decrease in size in the precarinal lymph node. No new or progressive findings. 2. Stable emphysematous changes but no acute pulmonary findings or worrisome pulmonary lesions. 3. Stable surgical changes from coronary artery bypass surgery. 4. Stable cardiac enlargement. 5. Bibasilar scarring changes.  Aortic Atherosclerosis (ICD10-I70.0) and Emphysema (ICD10-J43.9).   Electronically Signed   By: Marijo Sanes M.D.   On: 08/05/2018 14:26

## 2018-09-09 ENCOUNTER — Ambulatory Visit (AMBULATORY_SURGERY_CENTER): Payer: Self-pay | Admitting: *Deleted

## 2018-09-09 ENCOUNTER — Other Ambulatory Visit: Payer: Self-pay

## 2018-09-09 VITALS — Ht 66.0 in | Wt 186.0 lb

## 2018-09-09 DIAGNOSIS — Z86010 Personal history of colonic polyps: Secondary | ICD-10-CM

## 2018-09-09 MED ORDER — SUPREP BOWEL PREP KIT 17.5-3.13-1.6 GM/177ML PO SOLN: 1.0000 | Freq: Once | ORAL | 0 refills | Status: AC

## 2018-09-09 NOTE — Telephone Encounter (Signed)
Called and spoke with pt letting him know the results of the CT and stated to him that MR wanted him to come in for a f/u to further discuss results. Pt expressed understanding. appt scheduled for pt 09/17/2018 at 9:45. Pt has been made aware of new office address. Nothing further needed.

## 2018-09-09 NOTE — Progress Notes (Signed)
Patient denies any allergies to egg or soy products. Patient denies complications with anesthesia/sedation.  Patient denies oxygen use at home and denies diet medications. Patient denies information on colonoscopy. 

## 2018-09-17 ENCOUNTER — Encounter: Payer: Self-pay | Admitting: Internal Medicine

## 2018-09-17 ENCOUNTER — Ambulatory Visit (INDEPENDENT_AMBULATORY_CARE_PROVIDER_SITE_OTHER): Payer: Medicare Other | Admitting: Internal Medicine

## 2018-09-17 VITALS — BP 134/60 | HR 56 | Ht 67.0 in | Wt 188.4 lb

## 2018-09-17 DIAGNOSIS — R0609 Other forms of dyspnea: Secondary | ICD-10-CM | POA: Diagnosis not present

## 2018-09-17 DIAGNOSIS — R911 Solitary pulmonary nodule: Secondary | ICD-10-CM | POA: Diagnosis not present

## 2018-09-17 DIAGNOSIS — R590 Localized enlarged lymph nodes: Secondary | ICD-10-CM

## 2018-09-17 NOTE — Progress Notes (Signed)
03/19/2017 Follow up : DOE  Patient returns for a two-week follow-up. Patient was seen last visit for pulmonary consult. Patient is extremely active and does a lot of exercise. He was noticing that he had some shortness of breath when he went up. He'll. Also he had a CT chest in May 2018 that show some bilateral lobe atelectasis. Patient was set up for a high resolution CT chest done on 03/05/2017 that showed no evidence of interstitial lung disease. There was bibasilar parenchymal scarring. Patient was set up for a pulmonary function test that was done today that showed essentially normal lung function with an FEV1 of around 79%, ratio 78, no significant bronchodilator response, FVC 73%,  mild diffusing defect with a DLCO at 71% Patient has also been undergoing a cardiac workup. Patient had abnormal stress Myoview.Marland Kitchen He underwent a left heart catheter on 03/18/2017 that showed left main lesion 55% stenosed, proximal LAD to mid LAD, 75% stenosed, moderate RCA disease. He has been recommended for surgical consult for possible CABG.  Patient denies any chest pain, orthopnea, PND, cough, wheezing, or increased leg swelling.  TEST  02/2017 Walking desaturation test on an 185 feet 3 laps on room air in our office. He walked extremely fast. Resting pulse ox was 94%. Final pulse ox was 92%. Resting heart rate was 67/m. Final heart rate was 88/m  02/2017 Exhaled nitric oxide test in office today : feno 28ppb and normal- intermediate  He underwent CT scan of the chest without contrast 01/02/2017:  this is not a high-resolution CT chest showed bilateral lower lobe atelectasis with some groundglass opacities. There is a subcarinal node that is 1.6 cm in size.   OV 07/09/2017  Chief Complaint  Patient presents with  . Follow-up    heart bypass surgery 3 months ago, no complaints, no sob   Follow-up dyspnea: He was initially referred in spring/summer 2018 for dyspnea on exertion.  We did not find  any pulmonary cause.  He underwent a bypass surgery 3 months ago and after this the dyspnea on exertion resolved.  He is currently cleared from cardiac surgery follow-up.  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  He feels his dyspnea problem has resolved.  He continues to do push-ups without any problems  Positive history of subcarinal lymphadenopathy 1.6 cm: This was diagnosed on the CT scan of the chest without contrast in May 2018.  Clinically it was felt to be reactive.  He did not have any lung nodules.  Nevertheless the sizes significant because it is greater than 1 cm.  He is open to having this followed up at this point in time he does not have shortness of breath cough or wheeze   OV 09/17/2018  Subjective:  Patient ID: Jordan [name], male , DOB: 02/01/1941 , age 78 y.o. , MRN: [id number] , ADDRESS: [place] [id number]   09/17/2018 -   Chief Complaint  Patient presents with  . Follow-up    F/U after CT scan. Patient states his breathing is the same since last visit.      HPI Jordan [name] 78 y.o. - presents for followup of 2 issues. Wife here with him firs time  #Mediastinal node - he had 2.4cm precarinal node in 2018. Followup CT chest dec 2019 has shurnk to 1.4cm (personally visaulized)  #Dyspnea with low dlco -> dyspnea resolved at last visit after CABG. Now he tells me  though he is improved after CABG never fully resolved. 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 Feels behind his internal benchmark. No wheezing . No cough. Also feels unable to take a deep breath. Feels dyspnea can happen for walking across rooms but then paradoxically not there with heavy exercise.CT Chest May 2018 and July 2018 - pre-CABG do show postinflammatory bibasal bilateral nonspecific scarring.  In the December 2019 CT chest this is unchanged/slightly improved.  The radiologist did  not call this is evidence of ILD.  Also has gained 8 pounds of weight in the last 1 year.  He wants to make sure there is no pathology that is being missed.  He is open to having a pulmonary stress test.  Last pulmonary function test was July 2018.     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   reports that he has never smoked. He has never used smokeless tobacco.  Past Surgical History:  Procedure Laterality Date  . Cardiac Stress Test  2008   Normal  . CARPAL TUNNEL RELEASE Left 1998  . CARPAL TUNNEL RELEASE Right 2008  . COLONOSCOPY  06/2013   Henrene Pastor - polyps  . CORONARY ARTERY BYPASS GRAFT N/A 04/04/2017   Procedure: CORONARY ARTERY BYPASS GRAFTING x 4  LIMA-LAD, SVG-D2, SVG-OM, SVG-PDA (endoscopic SVG harvesting from right leg);  Surgeon: Gaye Pollack, MD;  Location: [facility];  Service: Open Heart Surgery;  Laterality: N/A;  . HERNIA REPAIR Left    inguinal  . HIATAL HERNIA REPAIR     no surgery per pt  . KNEE CARTILAGE SURGERY Bilateral   . LEFT HEART CATH AND CORONARY ANGIOGRAPHY N/A 03/18/2017   Procedure: Left Heart Cath and Coronary Angiography;  Surgeon: Leonie Man, MD;  Location: [facility];  Service: Cardiovascular:  55% dLM, p-mLAD 75% - mLAD 75%.  EF 55-65%. Mod non-obstructive RCA disease --> referred for CABG  . MEDIAL PARTIAL KNEE REPLACEMENT Right 2010   Partial right knee replacement  . NM MYOVIEW LTD  01/2017   INTERMEDIATE RISK: Exercise tolerance was good at 10 minutes, however, fatigue and dyspnea were  reporte-d -> hypertensive response to exercise.  54mm Horizontal ST depressions noted during stress in the II, III, aVF, V6, V5 and V4 leads c/w ischemia.   Small siize, moderate severity perfusion defect  mid to distal anterior wall perfusion defect suggestive of ischemia..   . ROTATOR CUFF REPAIR Left 2008  . SHOULDER ARTHROSCOPY W/ ROTATOR CUFF REPAIR Right 06/15/2016  . TEE WITHOUT CARDIOVERSION N/A 04/04/2017   Procedure: TRANSESOPHAGEAL ECHOCARDIOGRAM (TEE);  Surgeon: Gaye Pollack, MD;  Location: [facility];  Service: Open Heart Surgery;  Laterality: N/A;  . TONSILLECTOMY      Allergies  Allergen Reactions  . Crestor [Rosuvastatin Calcium] Other (See Comments)    Muscle aches     Immunization History  Administered Date(s) Administered  . Influenza, High Dose Seasonal PF 05/20/2016    Family History  Problem Relation Age of Onset  . Heart failure Mother   .  Cancer Mother        type unknown  . Coronary artery disease Father        CABG  . Heart attack Father        Long-term smoker.  Marland Kitchen Heart disease Father   . Hyperlipidemia Father   . Hyperlipidemia Brother   . Hypertension Brother   . Hyperlipidemia Son   . Colon cancer Neg Hx   . Colon polyps Neg Hx   . Rectal cancer Neg Hx   . Stomach cancer Neg Hx      Current Outpatient Medications:  .  Calcium Citrate-Vitamin D (CITRACAL MAXIMUM) 315-250 MG-UNIT TABS, Take 1 tablet by mouth 2 (two) times daily. , Disp: , Rfl:  .  cromolyn (OPTICROM) 4 % ophthalmic solution, Place 1 drop into both eyes 4 (four) times daily., Disp: , Rfl:  .  Cyanocobalamin (VITAMIN B-12 PO), Take 1 tablet by mouth every evening. , Disp: , Rfl:  .  ezetimibe (ZETIA) 10 MG tablet, Take 10 mg by mouth every evening. , Disp: , Rfl:  .  metoprolol tartrate (LOPRESSOR) 25 MG tablet, TAKE 1/2 TABLET TWICE A DAY, Disp: 90 tablet, Rfl: 0 .  Multiple Vitamins-Minerals (CENTRUM SILVER PO), Take 1 tablet by mouth daily., Disp: , Rfl:  .  tadalafil  (ADCIRCA/CIALIS) 20 MG tablet, Take 20 mg by mouth daily as needed for erectile dysfunction., Disp: , Rfl:       Objective:   Vitals:   09/17/18 0943  BP: 134/60  Pulse: (!) 56  SpO2: 96%  Weight: 188 lb 6.4 oz (85.5 kg)  Height: 5\' 7"  (1.702 m)    Estimated body mass index is 29.51 kg/m as calculated from the following:   Height as of this encounter: 5\' 7"  (1.702 m).   Weight as of this encounter: 188 lb 6.4 oz (85.5 kg).  @WEIGHTCHANGE @  [employer]   09/17/18 0943  Weight: 188 lb 6.4 oz (85.5 kg)     Physical Exam  General Appearance:    Alert, cooperative, no distress, appears stated age - yes , Deconditioned looking - no , OBESE  - no, Sitting on Wheelchair -  no  Head:    Normocephalic, without obvious abnormality, atraumatic  Eyes:    PERRL, conjunctiva/corneas clear,  Ears:    Normal TM's and external ear canals, both ears  Nose:   Nares normal, septum midline, mucosa normal, no drainage    or sinus tenderness. OXYGEN ON  - no . Patient is @ ra   Throat:   Lips, mucosa, and tongue normal; teeth and gums normal. Cyanosis on lips - no  Neck:   Supple, symmetrical, trachea midline, no adenopathy;    thyroid:  no enlargement/tenderness/nodules; no carotid   bruit or JVD  Back:     Symmetric, no curvature, ROM normal, no CVA tenderness  Lungs:     Distress - no , Wheeze no, Barrell Chest - no, Purse lip breathing - no, Crackles - yes mild R > L baseal crackles  Chest Wall:    No tenderness or deformity.    Heart:    Regular rate and rhythm, S1 and S2 normal, no rub   or gallop, Murmur - n  Breast Exam:    NOT DONE  Abdomen:     Soft, non-tender, bowel sounds active all four quadrants,    no masses, no organomegaly. Visceral obesity - yes , mild  Genitalia:   NOT DONE  Rectal:   NOT DONE  Extremities:   Extremities - normal, Has Cane - no, Clubbing - no, Edema - no  Pulses:   2+ and symmetric all extremities  Skin:   Stigmata of Connective Tissue Disease - no    Lymph nodes:   Cervical, supraclavicular, and axillary nodes normal  Psychiatric:  Neurologic:   Pleasant - yes, Anxious - no, Flat affect - no  CAm-ICU - neg, Alert and Oriented x 3 - yes, Moves all 4s - yes, Speech - normal, Cognition - intact           Assessment:       ICD-10-CM   1. Dyspnea on exertion R06.09   2. Mediastinal adenopathy R59.0        Plan:     Patient Instructions  Dyspnea on exertion - multifactorial /not fully clear all factors but weight gain and post-inflammatory scar in lung tisse seen in CT chest 2018 -> dec 2019 could be playing a role. We will monitor this  - do cpst test with EIB challenge in mid to end feb 2020 - dp spirometry/dlco in mid-end feb 2020 - return to see me after that      Mediastinal adenopathy 2.4 cm July 2018 - improved to 1.4cm dec 2019 - do followup CT chest with contrast dec 2020  Followup - feb/march 2020 but after CPST and breathing test   > 50% of this > 25 min visit spent in face to face counseling or coordination of care - by this undersigned MD - Dr Brand Males. This includes one or more of the following documented above: discussion of test results, diagnostic or treatment recommendations, prognosis, risks and benefits of management options, instructions, education, compliance or risk-factor reduction   SIGNATURE    Dr. Brand Males, M.D., F.C.C.P,  Pulmonary and Critical Care Medicine Staff Physician, [place] Director - Interstitial Lung Disease  Program  P[facility] [place] [facility] [place], [place], [address]  Pager: [phone], If no answer or between  15:00h - 7:00h: call [phone] Telephone: [phone]  10:23 AM 09/17/2018

## 2018-09-17 NOTE — Patient Instructions (Addendum)
Dyspnea on exertion - multifactorial /not fully clear all factors but weight gain and post-inflammatory scar in lung tisse seen in CT chest 2018 -> dec 2019 could be playing a role. We will monitor this  - do cpst test with EIB challenge in mid to end feb 2020 - dp spirometry/dlco in mid-end feb 2020 - return to see me after that      Mediastinal adenopathy 2.4 cm July 2018 - improved to 1.4cm dec 2019 - do followup CT chest with contrast dec 2020  Followup - feb/march 2020 but after CPST and breathing test

## 2018-09-23 ENCOUNTER — Ambulatory Visit (AMBULATORY_SURGERY_CENTER): Payer: Medicare Other | Admitting: Internal Medicine

## 2018-09-23 ENCOUNTER — Encounter: Payer: Self-pay | Admitting: Internal Medicine

## 2018-09-23 VITALS — BP 112/61 | HR 53 | Temp 97.1°F | Resp 17

## 2018-09-23 DIAGNOSIS — I10 Essential (primary) hypertension: Secondary | ICD-10-CM | POA: Diagnosis not present

## 2018-09-23 DIAGNOSIS — K449 Diaphragmatic hernia without obstruction or gangrene: Secondary | ICD-10-CM | POA: Diagnosis not present

## 2018-09-23 DIAGNOSIS — Z860100 Personal history of colon polyps, unspecified: Secondary | ICD-10-CM

## 2018-09-23 DIAGNOSIS — I2510 Atherosclerotic heart disease of native coronary artery without angina pectoris: Secondary | ICD-10-CM | POA: Diagnosis not present

## 2018-09-23 DIAGNOSIS — D122 Benign neoplasm of ascending colon: Secondary | ICD-10-CM

## 2018-09-23 MED ORDER — SODIUM CHLORIDE 0.9 % IV SOLN: 500.0000 mL | Freq: Once | INTRAVENOUS | Status: DC

## 2018-09-23 NOTE — Progress Notes (Signed)
Report to PACU, RN, vss, BBS= Clear.  

## 2018-09-23 NOTE — Patient Instructions (Signed)
Please read handouts provided. Continue present medications. Await pathology results.     YOU HAD AN ENDOSCOPIC PROCEDURE TODAY AT THE [facility]:   Refer to the procedure report that was given to you for any specific questions about what was found during the examination.  If the procedure report does not answer your questions, please call your gastroenterologist to clarify.  If you requested that your care partner not be given the details of your procedure findings, then the procedure report has been included in a sealed envelope for you to review at your convenience later.  YOU SHOULD EXPECT: Some feelings of bloating in the abdomen. Passage of more gas than usual.  Walking can help get rid of the air that was put into your GI tract during the procedure and reduce the bloating. If you had a lower endoscopy (such as a colonoscopy or flexible sigmoidoscopy) you may notice spotting of blood in your stool or on the toilet paper. If you underwent a bowel prep for your procedure, you may not have a normal bowel movement for a few days.  Please Note:  You might notice some irritation and congestion in your nose or some drainage.  This is from the oxygen used during your procedure.  There is no need for concern and it should clear up in a day or so.  SYMPTOMS TO REPORT IMMEDIATELY:   Following lower endoscopy (colonoscopy or flexible sigmoidoscopy):  Excessive amounts of blood in the stool  Significant tenderness or worsening of abdominal pains  Swelling of the abdomen that is new, acute  Fever of 100F or higher    For urgent or emergent issues, a gastroenterologist can be reached at any hour by calling [phone].   DIET:  We do recommend a small meal at first, but then you may proceed to your regular diet.  Drink plenty of fluids but you should avoid alcoholic beverages for 24 hours.  ACTIVITY:  You should plan to take it easy for the rest of today and you should NOT DRIVE  or use heavy machinery until tomorrow (because of the sedation medicines used during the test).    FOLLOW UP: Our staff will call the number listed on your records the next business day following your procedure to check on you and address any questions or concerns that you may have regarding the information given to you following your procedure. If we do not reach you, we will leave a message.  However, if you are feeling well and you are not experiencing any problems, there is no need to return our call.  We will assume that you have returned to your regular daily activities without incident.  If any biopsies were taken you will be contacted by phone or by letter within the next 1-3 weeks.  Please call us at [phone] if you have not heard about the biopsies in 3 weeks.    SIGNATURES/CONFIDENTIALITY: You and/or your care partner have signed paperwork which will be entered into your electronic medical record.  These signatures attest to the fact that that the information above on your After Visit Summary has been reviewed and is understood.  Full responsibility of the confidentiality of this discharge information lies with you and/or your care-partner. 

## 2018-09-23 NOTE — Progress Notes (Signed)
Pt's states no medical or surgical changes since previsit or office visit. 

## 2018-09-23 NOTE — Op Note (Signed)
[facility] Patient Name: Jordan [name] Procedure Date: 09/23/2018 11:27 AM MRN: [id number] Endoscopist: Docia Chuck. Henrene Pastor , MD Age: 78 Referring MD:  Date of Birth: 12/26/1940 Gender: Male Account #: [id number] Procedure:                Colonoscopy with cold snare polypectomy x 1 Indications:              High risk colon cancer surveillance: Personal                            history of adenoma with villous component, High                            risk colon cancer surveillance: Personal history of                            multiple (3 or more) adenomas. Previous                            examinations 2004, 2006, 2010, 2014 Medicines:                Monitored Anesthesia Care Procedure:                Pre-Anesthesia Assessment:                           - Prior to the procedure, a History and Physical                            was performed, and patient medications and                            allergies were reviewed. The patient's tolerance of                            previous anesthesia was also reviewed. The risks                            and benefits of the procedure and the sedation                            options and risks were discussed with the patient.                            All questions were answered, and informed consent                            was obtained. Prior Anticoagulants: The patient has                            taken no previous anticoagulant or antiplatelet                            agents. ASA Grade Assessment: II - A patient with  mild systemic disease. After reviewing the risks                            and benefits, the patient was deemed in                            satisfactory condition to undergo the procedure.                           After obtaining informed consent, the colonoscope                            was passed under direct vision. Throughout the                            procedure,  the patient's blood pressure, pulse, and                            oxygen saturations were monitored continuously. The                            Model CF-HQ190L [id number]) scope was introduced                            through the anus and advanced to the the cecum,                            identified by appendiceal orifice and ileocecal                            valve. The ileocecal valve, appendiceal orifice,                            and rectum were photographed. The quality of the                            bowel preparation was good. The colonoscopy was                            performed without difficulty. The patient tolerated                            the procedure well. The bowel preparation used was                            SUPREP. Scope In: 11:38:17 AM Scope Out: 11:53:14 AM Scope Withdrawal Time: 0 hours 12 minutes 21 seconds  Total Procedure Duration: 0 hours 14 minutes 57 seconds  Findings:                 A 3 mm polyp was found in the ascending colon. The                            polyp was removed with a cold snare. Resection and  retrieval were complete.                           Scattered diverticula were found in the sigmoid                            colon and ascending colon.                           Internal hemorrhoids were found during retroflexion.                           The exam was otherwise without abnormality on                            direct and retroflexion views. Complications:            No immediate complications. Estimated blood loss:                            None. Estimated Blood Loss:     Estimated blood loss: none. Impression:               - One 3 mm polyp in the ascending colon, removed                            with a cold snare. Resected and retrieved.                           - Diverticulosis in the sigmoid colon and in the                            ascending colon.                            - Internal hemorrhoids.                           - The examination was otherwise normal on direct                            and retroflexion views. Recommendation:           - Repeat colonoscopy in 5 years for surveillance.                           - Patient has a contact number available for                            emergencies. The signs and symptoms of potential                            delayed complications were discussed with the                            patient. Return to normal activities tomorrow.  Written discharge instructions were provided to the                            patient.                           - Resume previous diet.                           - Continue present medications.                           - Await pathology results. Docia Chuck. Henrene Pastor, MD 09/23/2018 11:58:32 AM This report has been signed electronically.

## 2018-09-24 ENCOUNTER — Telehealth: Payer: Self-pay

## 2018-09-24 ENCOUNTER — Telehealth: Payer: Self-pay | Admitting: *Deleted

## 2018-09-24 NOTE — Telephone Encounter (Signed)
  Follow up [place] message on f/u call  Call back number 09/23/2018  Post procedure Call Back phone  # [phone]  Permission to leave phone message Yes  Some recent data might be hidden

## 2018-09-24 NOTE — Telephone Encounter (Signed)
  Follow up Call-  Call back number 09/23/2018  Post procedure Call Back phone  # [phone]  Permission to leave phone message Yes  Some recent data might be hidden     No ID on voicemail; no message left

## 2018-09-25 ENCOUNTER — Encounter: Payer: Self-pay | Admitting: Internal Medicine

## 2018-10-08 ENCOUNTER — Ambulatory Visit (HOSPITAL_COMMUNITY): Payer: Medicare Other | Attending: Internal Medicine

## 2018-10-08 ENCOUNTER — Other Ambulatory Visit (HOSPITAL_COMMUNITY): Payer: Self-pay | Admitting: *Deleted

## 2018-10-08 DIAGNOSIS — R0609 Other forms of dyspnea: Secondary | ICD-10-CM | POA: Insufficient documentation

## 2018-10-20 DIAGNOSIS — R42 Dizziness and giddiness: Secondary | ICD-10-CM | POA: Diagnosis not present

## 2018-10-20 DIAGNOSIS — N390 Urinary tract infection, site not specified: Secondary | ICD-10-CM | POA: Diagnosis not present

## 2018-10-20 DIAGNOSIS — R310 Gross hematuria: Secondary | ICD-10-CM | POA: Diagnosis not present

## 2018-10-21 ENCOUNTER — Ambulatory Visit (INDEPENDENT_AMBULATORY_CARE_PROVIDER_SITE_OTHER): Payer: Medicare Other | Admitting: Internal Medicine

## 2018-10-21 ENCOUNTER — Encounter: Payer: Self-pay | Admitting: Internal Medicine

## 2018-10-21 VITALS — BP 124/78 | HR 68 | Ht 68.0 in | Wt 185.0 lb

## 2018-10-21 DIAGNOSIS — R0609 Other forms of dyspnea: Secondary | ICD-10-CM | POA: Diagnosis not present

## 2018-10-21 LAB — PULMONARY FUNCTION TEST
DL/VA % pred: 102 %
DL/VA: 4.09 ml/min/mmHg/L
DLCO unc % pred: 97 %
DLCO unc: 23 ml/min/mmHg
FEF 25-75 PRE: 1.53 L/s
FEF2575-%Pred-Pre: 74 %
FEV1-%PRED-PRE: 85 %
FEV1-Pre: 2.39 L
FEV1FVC-%Pred-Pre: 97 %
FEV6-%Pred-Pre: 91 %
FEV6-Pre: 3.36 L
FEV6FVC-%Pred-Pre: 106 %
FVC-%Pred-Pre: 86 %
FVC-Pre: 3.37 L
Pre FEV1/FVC ratio: 71 %
Pre FEV6/FVC Ratio: 99 %

## 2018-10-21 NOTE — Progress Notes (Signed)
Spirometry and Dlco done today. 

## 2018-10-21 NOTE — Patient Instructions (Addendum)
Dyspnea on exertion - -cardiopulmonary stress test in February 2020 suggests being overweight (belly fat) is one reason for shortness of breath and the other being suppressed heart rate response because of metoprolol -At this point in time you can watch yourself without metoprolol -If blood pressure is high then you can take half dose metoprolol or change to another agent that does not suppress the heart rate --Overall performance was excellent and greater than 115% of normal cohorts your age and is generally predicts a good outcome     Mediastinal adenopathy 2.4 cm July 2018 - improved to 1.4cm dec 2019 - do followup CT chest with contrast dec 2020  Followup - dec 2020 after ct chgest

## 2018-10-21 NOTE — Progress Notes (Signed)
03/19/2017 Follow up : DOE  Patient returns for a two-week follow-up. Patient was seen last visit for pulmonary consult. Patient is extremely active and does a lot of exercise. He was noticing that he had some shortness of breath when he went up. He'll. Also he had a CT chest in May 2018 that show some bilateral lobe atelectasis. Patient was set up for a high resolution CT chest done on 03/05/2017 that showed no evidence of interstitial lung disease. There was bibasilar parenchymal scarring. Patient was set up for a pulmonary function test that was done today that showed essentially normal lung function with an FEV1 of around 79%, ratio 78, no significant bronchodilator response, FVC 73%,  mild diffusing defect with a DLCO at 71% Patient has also been undergoing a cardiac workup. Patient had abnormal stress Myoview.Marland Kitchen He underwent a left heart catheter on 03/18/2017 that showed left main lesion 55% stenosed, proximal LAD to mid LAD, 75% stenosed, moderate RCA disease. He has been recommended for surgical consult for possible CABG.  Patient denies any chest pain, orthopnea, PND, cough, wheezing, or increased leg swelling.  TEST  02/2017 Walking desaturation test on an 185 feet 3 laps on room air in our office. He walked extremely fast. Resting pulse ox was 94%. Final pulse ox was 92%. Resting heart rate was 67/m. Final heart rate was 88/m  02/2017 Exhaled nitric oxide test in office today : feno 28ppb and normal- intermediate  He underwent CT scan of the chest without contrast 01/02/2017:  this is not a high-resolution CT chest showed bilateral lower lobe atelectasis with some groundglass opacities. There is a subcarinal node that is 1.6 cm in size.   OV 07/09/2017  Chief Complaint  Patient presents with  . Follow-up    heart bypass surgery 3 months ago, no complaints, no sob   Follow-up dyspnea: He was initially referred in spring/summer 2018 for dyspnea on exertion.  We did not find  any pulmonary cause.  He underwent a bypass surgery 3 months ago and after this the dyspnea on exertion resolved.  He is currently cleared from cardiac surgery follow-up.  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  He feels his dyspnea problem has resolved.  He continues to do push-ups without any problems  Positive history of subcarinal lymphadenopathy 1.6 cm: This was diagnosed on the CT scan of the chest without contrast in May 2018.  Clinically it was felt to be reactive.  He did not have any lung nodules.  Nevertheless the sizes significant because it is greater than 1 cm.  He is open to having this followed up at this point in time he does not have shortness of breath cough or wheeze   OV 09/17/2018  Subjective:  Patient ID: Jordan [name], male , DOB: 08-May-1941 , age 78 y.o. , MRN: [id number] , ADDRESS: [place] [id number]   09/17/2018 -   Chief Complaint  Patient presents with  .     F/U after CT scan. Patient states his breathing is the same since last visitFollow-up.      HPI Jordan [name] 78 y.o. - presents for followup of 2 issues. Wife here with him firs time  #Mediastinal node - he had 2.4cm precarinal node in 2018. Followup CT chest dec 2019 has shurnk to 1.4cm (personally visaulized)  #Dyspnea with low dlco -> dyspnea resolved at last visit after CABG. Now he tells me  though he is improved after CABG never fully resolved. 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 Feels behind his internal benchmark. No wheezing . No cough. Also feels unable to take a deep breath. Feels dyspnea can happen for walking across rooms but then paradoxically not there with heavy exercise.CT Chest May 2018 and July 2018 - pre-CABG do show postinflammatory bibasal bilateral nonspecific scarring.  In the December 2019 CT chest this is unchanged/slightly improved.  The radiologist did  not call this is evidence of ILD.  Also has gained 8 pounds of weight in the last 1 year.  He wants to make sure there is no pathology that is being missed.  He is open to having a pulmonary stress test.  Last pulmonary function test was July 2018.   OV 10/21/2018  Subjective:  Patient ID: Jordan [name], male , DOB: 11/01/1940 , age 61 y.o. , MRN: [id number] , ADDRESS: [place] [id number]   10/21/2018 -   Chief Complaint  Patient presents with  . Follow-up    PFT performed today.     Follow-up discussion of test results for dyspnea on exertion  HPI Jordan [name] 78 y.o. -had spirometry today and it was normal.  Had pulmonary stress test mid February 2019.  His BMI is 29.1.  Spirometry that was normal.  Resting heart rate was 52.  Peak heart rate was 127 which is 88% of age-predicted heart rate max.  Blood pressure systolic at peak was 814/48.  The peak VO2 was 26.4 which is 115% of predicted.  When corrected for his ideal body weight the peak VO2 was 30.2 mL/kg per ideal body weight.  This suggests obesity as a reason for dyspnea.  His respiratory exchange ratio is 1.11 suggesting a good test.  No bronchospasm.  The technician thought that the BP response was hypertensive but I think this is high normal.  On the other hand I think the heart rate response was inadequate   Results for Hass, Loyd Jordan "Jordan" (MRN [id number]) as of 10/21/2018 11:25  Ref. Range 10/21/2018 10:06  FVC-Pre Latest Units: L 3.37  FVC-%Pred-Pre Latest Units: % 86  FEV1-Pre Latest Units: L 2.39  FEV1-%Pred-Pre Latest Units: % 85  Pre FEV1/FVC ratio Latest Units: % 71   dResults for [name], Ayiden Jordan "Jordan" (MRN [id number]) as of 10/21/2018 11:25  Ref. Range 10/21/2018 10:06  DLCO unc Latest Units: ml/min/mmHg 23.00  DLCO unc % pred Latest Units: % 97    CPST 10/08/2018  Conclusion: Exercise testing with gas exchange demonstrates a normal (actually excellent) functional capacity when  compared to matched sedentary norms. ALthough VE/VCO2 slope elevated, there is not enough supportive evidence to indicate cardiopulmonary limitation. Patient's PVO2 improves with correction of ideal body weight, indicating contributing factors from body habitus likely contributing to the hypertensive response to exercise. Other factors could suggest very mild diastolic dysfunction with the elevated VE/VCO2 slope and hypertensive response without hyperventilation.    Test, report and preliminary impression by: Landis Martins, MS, ACSM-RCEP 10/08/2018 3:41 PM   PRELIMINARY CPX RESULTS, FINALIZED RESULTS WILL BE FORWARDED WHEN COMPLETED BY INTERPRETING PHYSICIAN.  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   reports that he has never smoked. He has never used smokeless tobacco.  Past Surgical History:  Procedure Laterality Date  . Cardiac Stress Test  2008   Normal  . CARPAL TUNNEL RELEASE Left 1998  . CARPAL TUNNEL RELEASE Right 2008  . COLONOSCOPY  06/2013   Henrene Pastor - polyps  . CORONARY ARTERY BYPASS GRAFT N/A 04/04/2017   Procedure: CORONARY ARTERY BYPASS GRAFTING x 4  LIMA-LAD, SVG-D2, SVG-OM, SVG-PDA (endoscopic SVG harvesting from right leg);  Surgeon: Gaye Pollack, MD;  Location: [facility];  Service: Open Heart Surgery;  Laterality: N/A;  . HERNIA REPAIR Left    inguinal  . HIATAL HERNIA REPAIR     no surgery per pt  . KNEE CARTILAGE SURGERY Bilateral   . LEFT HEART CATH AND CORONARY ANGIOGRAPHY N/A 03/18/2017   Procedure: Left Heart Cath  and Coronary Angiography;  Surgeon: Leonie Man, MD;  Location: [facility];  Service: Cardiovascular:  55% dLM, p-mLAD 75% - mLAD 75%.  EF 55-65%. Mod non-obstructive RCA disease --> referred for CABG  . MEDIAL PARTIAL KNEE REPLACEMENT Right 2010   Partial right knee replacement  . NM MYOVIEW LTD  01/2017   INTERMEDIATE RISK: Exercise tolerance was good at 10 minutes, however, fatigue and dyspnea were reporte-d -> hypertensive response to exercise.  14mm Horizontal ST depressions noted during stress in the II, III, aVF, V6, V5 and V4 leads c/w ischemia.   Small siize, moderate severity perfusion defect  mid to distal anterior wall perfusion defect suggestive of ischemia..   . ROTATOR CUFF REPAIR Left 2008  . SHOULDER ARTHROSCOPY W/ ROTATOR CUFF REPAIR Right 06/15/2016  . TEE WITHOUT CARDIOVERSION N/A 04/04/2017   Procedure: TRANSESOPHAGEAL ECHOCARDIOGRAM (TEE);  Surgeon: Gaye Pollack, MD;  Location: [facility];  Service: Open Heart Surgery;  Laterality: N/A;  . TONSILLECTOMY      Allergies  Allergen Reactions  . Crestor [Rosuvastatin Calcium] Other (See Comments)    Muscle aches     Immunization History  Administered Date(s) Administered  . Influenza, High Dose Seasonal PF 05/20/2016, 05/20/2018  . Zoster Recombinat (Shingrix) 05/17/2017, 09/05/2017    Family History  Problem Relation Age of Onset  . Heart failure Mother   . Cancer Mother        type unknown  . Coronary artery disease Father        CABG  . Heart attack Father        Long-term smoker.  Marland Kitchen Heart disease Father   . Hyperlipidemia Father   . Hyperlipidemia Brother   . Hypertension Brother   . Hyperlipidemia Son   . Colon cancer Neg Hx   . Colon polyps Neg Hx   . Rectal cancer Neg Hx   . Stomach cancer Neg Hx      Current Outpatient Medications:  .  Calcium Citrate-Vitamin D (CITRACAL MAXIMUM) 315-250 MG-UNIT TABS, Take 1 tablet by mouth 2 (two) times daily. , Disp: , Rfl:  .  Cyanocobalamin  (VITAMIN B-12 PO), Take 1 tablet by mouth every evening. , Disp: , Rfl:  .  ezetimibe (ZETIA) 10 MG tablet, Take 10 mg by mouth every evening. , Disp: , Rfl:  .  famotidine (PEPCID) 10 MG tablet, Take 10 mg by mouth 2 (two) times daily., Disp: , Rfl:  .  metoprolol tartrate (  LOPRESSOR) 25 MG tablet, TAKE 1/2 TABLET TWICE A DAY, Disp: 90 tablet, Rfl: 0 .  pravastatin (PRAVACHOL) 20 MG tablet, , Disp: , Rfl:  .  tadalafil (ADCIRCA/CIALIS) 20 MG tablet, Take 20 mg by mouth daily as needed for erectile dysfunction., Disp: , Rfl:  .  valsartan (DIOVAN) 80 MG tablet, Take 80 mg by mouth daily., Disp: , Rfl:       Objective:   Vitals:   10/21/18 1102  BP: 124/78  Pulse: 68  SpO2: 95%  Weight: 185 lb (83.9 kg)  Height: 5\' 8"  (1.727 m)    Estimated body mass index is 28.13 kg/m as calculated from the following:   Height as of this encounter: 5\' 8"  (1.727 m).   Weight as of this encounter: 185 lb (83.9 kg).  @WEIGHTCHANGE @  [employer]   10/21/18 1102  Weight: 185 lb (83.9 kg)     Physical Exam Discussion only        Assessment:     No diagnosis found.     Plan:     Patient Instructions  Dyspnea on exertion - -cardiopulmonary stress test in February 2020 suggests being overweight (belly fat) is one reason for shortness of breath and the other being suppressed heart rate response because of metoprolol -At this point in time you can watch yourself without metoprolol -If blood pressure is high then you can take half dose metoprolol or change to another agent that does not suppress the heart rate --Overall performance was excellent and greater than 115% of normal cohorts your age and is generally predicts a good outcome     Mediastinal adenopathy 2.4 cm July 2018 - improved to 1.4cm dec 2019 - do followup CT chest with contrast dec 2020  Followup - dec 2020 after ct chgest  (> 50% of this 15 min visit spent in face to face counseling or/and coordination of care by  this undersigned MD - Dr Brand Males. This includes one or more of the following documented above: discussion of test results, diagnostic or treatment recommendations, prognosis, risks and benefits of management options, instructions, education, compliance or risk-factor reduction)    SIGNATURE    Dr. Brand Males, M.D., F.C.C.P,  Pulmonary and Critical Care Medicine Staff Physician, [place] Director - Interstitial Lung Disease  Program  [facility] [place] at [place], [place], [address]  Pager: [phone], If no answer or between  15:00h - 7:00h: call [phone] Telephone: [phone]  12:05 PM 10/21/2018

## 2018-10-22 ENCOUNTER — Encounter (HOSPITAL_COMMUNITY): Payer: Self-pay | Admitting: General Practice

## 2018-10-22 ENCOUNTER — Other Ambulatory Visit: Payer: Self-pay | Admitting: Urology

## 2018-10-22 DIAGNOSIS — R310 Gross hematuria: Secondary | ICD-10-CM | POA: Diagnosis not present

## 2018-10-22 DIAGNOSIS — N200 Calculus of kidney: Secondary | ICD-10-CM | POA: Diagnosis not present

## 2018-10-22 NOTE — H&P (Signed)
gross hematuria evaluation  HPI: Jordan [name] is a 78 year-old male patient who was referred by Dr. Elta Guadeloupe A. Perini, MD who is here for further evaluation of gross hematuria.Marland Kitchen  He first noticed the blood approximately 04/20/2018. He has noticed blood a few times.   The patient has not had any recent abdominal imaging. He has been told that he has blood in his urine prior to this episode.   The patient has had progression of his voiding symptoms over the last 6 months including frequency and urgency. The patient complains of recent onset back pain.   The patient does not have a history of recurrent UTIs. They have a history of kidney stones. He has not been exposed to occupational hazards that may increase their risks for developing cancer. The patient has no family history of GU malignancy. The patient is a not smoker.   h/o Uric acid stones, saw Dr. Terance Hart in 2005 last.  Patient states he has dark colored urine for months. He recently noticed it more in August.     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   Tobacco Use Assessment Completed: Used Tobacco in last 30 days? Does drink.  Drinks 3 caffeinated drinks per day. 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    REVIEW OF SYSTEMS:    GU Review Male:   Patient reports frequent urination, hard to postpone urination, and get up at night to urinate. Patient denies burning/ pain with urination, leakage of urine, stream starts and stops, trouble starting your stream, have to strain to urinate , erection problems, and penile pain.  Gastrointestinal (Upper):   Patient denies nausea, vomiting, and indigestion/ heartburn.  Gastrointestinal (Lower):   Patient denies diarrhea and constipation.  Constitutional:   Patient denies fever, night sweats, weight loss, and fatigue.  Skin:   Patient denies skin rash/ lesion and itching.  Eyes:   Patient reports blurred vision. Patient denies double vision.  Ears/ Nose/ Throat:   Patient denies sore throat and sinus problems.  Hematologic/Lymphatic:   Patient reports easy bruising. Patient denies swollen glands.  Cardiovascular:   Patient denies leg swelling and chest pains.  Respiratory:   Patient reports shortness of breath. Patient denies cough.  Endocrine:   Patient denies excessive thirst.  Musculoskeletal:   Patient reports back pain. Patient denies joint pain.  Neurological:   Patient reports dizziness. Patient denies headaches.  Psychologic:   Patient denies depression and anxiety.   VITAL SIGNS:      10/22/2018 11:19 AM  Weight 183 lb / 83.01 kg  BP 129/71 mmHg  Pulse 55 /min  Temperature 97.9 F / 36.6 C   MULTI-SYSTEM PHYSICAL EXAMINATION:    Constitutional: Well-nourished. No physical deformities. Normally developed.  Good grooming.  Neck: Neck symmetrical, not swollen. Normal tracheal position.  Respiratory: No labored breathing, no use of accessory muscles.   Cardiovascular: Normal temperature, normal extremity pulses, no swelling, no varicosities.  Lymphatic: No enlargement of neck, axillae, groin.  Skin: No paleness, no jaundice, no cyanosis. No lesion, no ulcer, no rash.  Neurologic / Psychiatric: Oriented to time, oriented to  place, oriented to person. No depression, no anxiety, no agitation.  Gastrointestinal: No mass, no tenderness, no rigidity, non obese abdomen.  Eyes: Normal conjunctivae. Normal eyelids.  Ears, Nose, Mouth, and Throat: Left ear no scars, no lesions, no masses. Right ear no scars, no lesions, no masses. Nose no scars, no lesions, no masses. Normal hearing. Normal lips.  Musculoskeletal: Normal gait and station of head and neck.     PAST DATA REVIEWED:  Source Of History:  Patient  Records Review:   Previous Doctor Records, Previous Patient Records, POC Tool   PROCEDURES:         C.T. ABD-Pelv wo/w cm - 71245, [id number]  The patient has a 13 mm x 8 mm stone at the right UPJ. The stone does appear to be radio opaque and is identified quite easily on the scout film. There are no additional stones or other etiology or explanation for his bleeding.      OMNIPAQUE 300 125CC Patient confirmed No Neulasta OnPro Device.           Urinalysis w/Scope Dipstick Dipstick Cont'd Micro  Color: Yellow Bilirubin: Neg mg/dL WBC/hpf: 0 - 5/hpf  Appearance: Clear Ketones: Neg mg/dL RBC/hpf: NS (Not Seen)  Specific Gravity: 1.010 Blood: Neg ery/uL Bacteria: NS (Not Seen)  pH: 7.5 Protein: Neg mg/dL Cystals: NS (Not Seen)  Glucose: Neg mg/dL Urobilinogen: 0.2 mg/dL Casts: NS (Not Seen)    Nitrites: Neg Trichomonas: Not Present    Leukocyte Esterase: 1+ leu/uL Mucous: Not Present      Epithelial Cells: NS (Not Seen)      Yeast: NS (Not Seen)      Sperm: Not Present    ASSESSMENT:      ICD-10 Details  1 GU:   Gross hematuria - R31.0   2   Renal calculus - N20.0    PLAN:           Orders X-Rays: C.T. Abdomen/Pelvis With and Without I.V. Contrast          Schedule Return Visit/Planned Activity: ASAP - Schedule Surgery          Document Letter(s):  Created for Patient: Clinical Summary         Notes:   The patient has a fairly large right nonobstructing UPJ stone. This is likely the etiology of  his intermittent hematuria. Our plan is to treat his stone and then once he has completed therapy and things settle down repeat his urinalysis. If he continues to have blood in his urine at that point he will need cystoscopy. However at this time we have held off on that.   We discussed management options including medical expulsion therapy, shockwave lithotripsy, and ureteroscopy. Ultimately, the patient has opted for shock wave lithotripsy. I discussed with the patient the procedure in detail as well as the risk and benefits. The patient is aware that she may need additional procedures. She also is aware of the risks of hematoma and pain. We will try to get this patient's scheduled as soon as possible.    After a thorough review of the management options for the  patient's condition the patient  elected to proceed with surgical therapy as noted above. We have discussed the potential benefits and risks of the procedure, side effects of the proposed treatment, the likelihood of the patient achieving the goals of the procedure, and any potential problems that might occur during the procedure or recuperation. Informed consent has been obtained.

## 2018-10-23 ENCOUNTER — Ambulatory Visit (HOSPITAL_COMMUNITY)
Admission: RE | Admit: 2018-10-23 | Discharge: 2018-10-23 | Disposition: A | Discharge status: Home / Self Care | Payer: Medicare Other | Attending: Urology | Admitting: Urology

## 2018-10-23 ENCOUNTER — Ambulatory Visit (HOSPITAL_COMMUNITY): Payer: Medicare Other

## 2018-10-23 ENCOUNTER — Encounter (HOSPITAL_COMMUNITY)
Admission: RE | Disposition: A | Discharge status: Home / Self Care | Payer: Self-pay | Source: Home / Self Care | Attending: Urology

## 2018-10-23 ENCOUNTER — Encounter (HOSPITAL_COMMUNITY): Payer: Self-pay | Admitting: *Deleted

## 2018-10-23 ENCOUNTER — Other Ambulatory Visit: Payer: Self-pay

## 2018-10-23 DIAGNOSIS — N200 Calculus of kidney: Secondary | ICD-10-CM | POA: Insufficient documentation

## 2018-10-23 DIAGNOSIS — I10 Essential (primary) hypertension: Secondary | ICD-10-CM | POA: Diagnosis not present

## 2018-10-23 DIAGNOSIS — N202 Calculus of kidney with calculus of ureter: Secondary | ICD-10-CM | POA: Diagnosis not present

## 2018-10-23 DIAGNOSIS — Z96659 Presence of unspecified artificial knee joint: Secondary | ICD-10-CM | POA: Insufficient documentation

## 2018-10-23 DIAGNOSIS — I519 Heart disease, unspecified: Secondary | ICD-10-CM | POA: Diagnosis not present

## 2018-10-23 DIAGNOSIS — N201 Calculus of ureter: Secondary | ICD-10-CM

## 2018-10-23 DIAGNOSIS — Z951 Presence of aortocoronary bypass graft: Secondary | ICD-10-CM | POA: Insufficient documentation

## 2018-10-23 HISTORY — PX: EXTRACORPOREAL SHOCK WAVE LITHOTRIPSY: SHX1557

## 2018-10-23 IMAGING — CR DG ABDOMEN 1V
1 series · 1 of 1 positions shown · non-contrast
Comparison: [HOSPITAL] CT Abdomen and Pelvis
[DATE].

CLINICAL DATA: 77-year-old male with right ureteral stone.

EXAM:
ABDOMEN - 1 VIEW

[t abdomen supine]
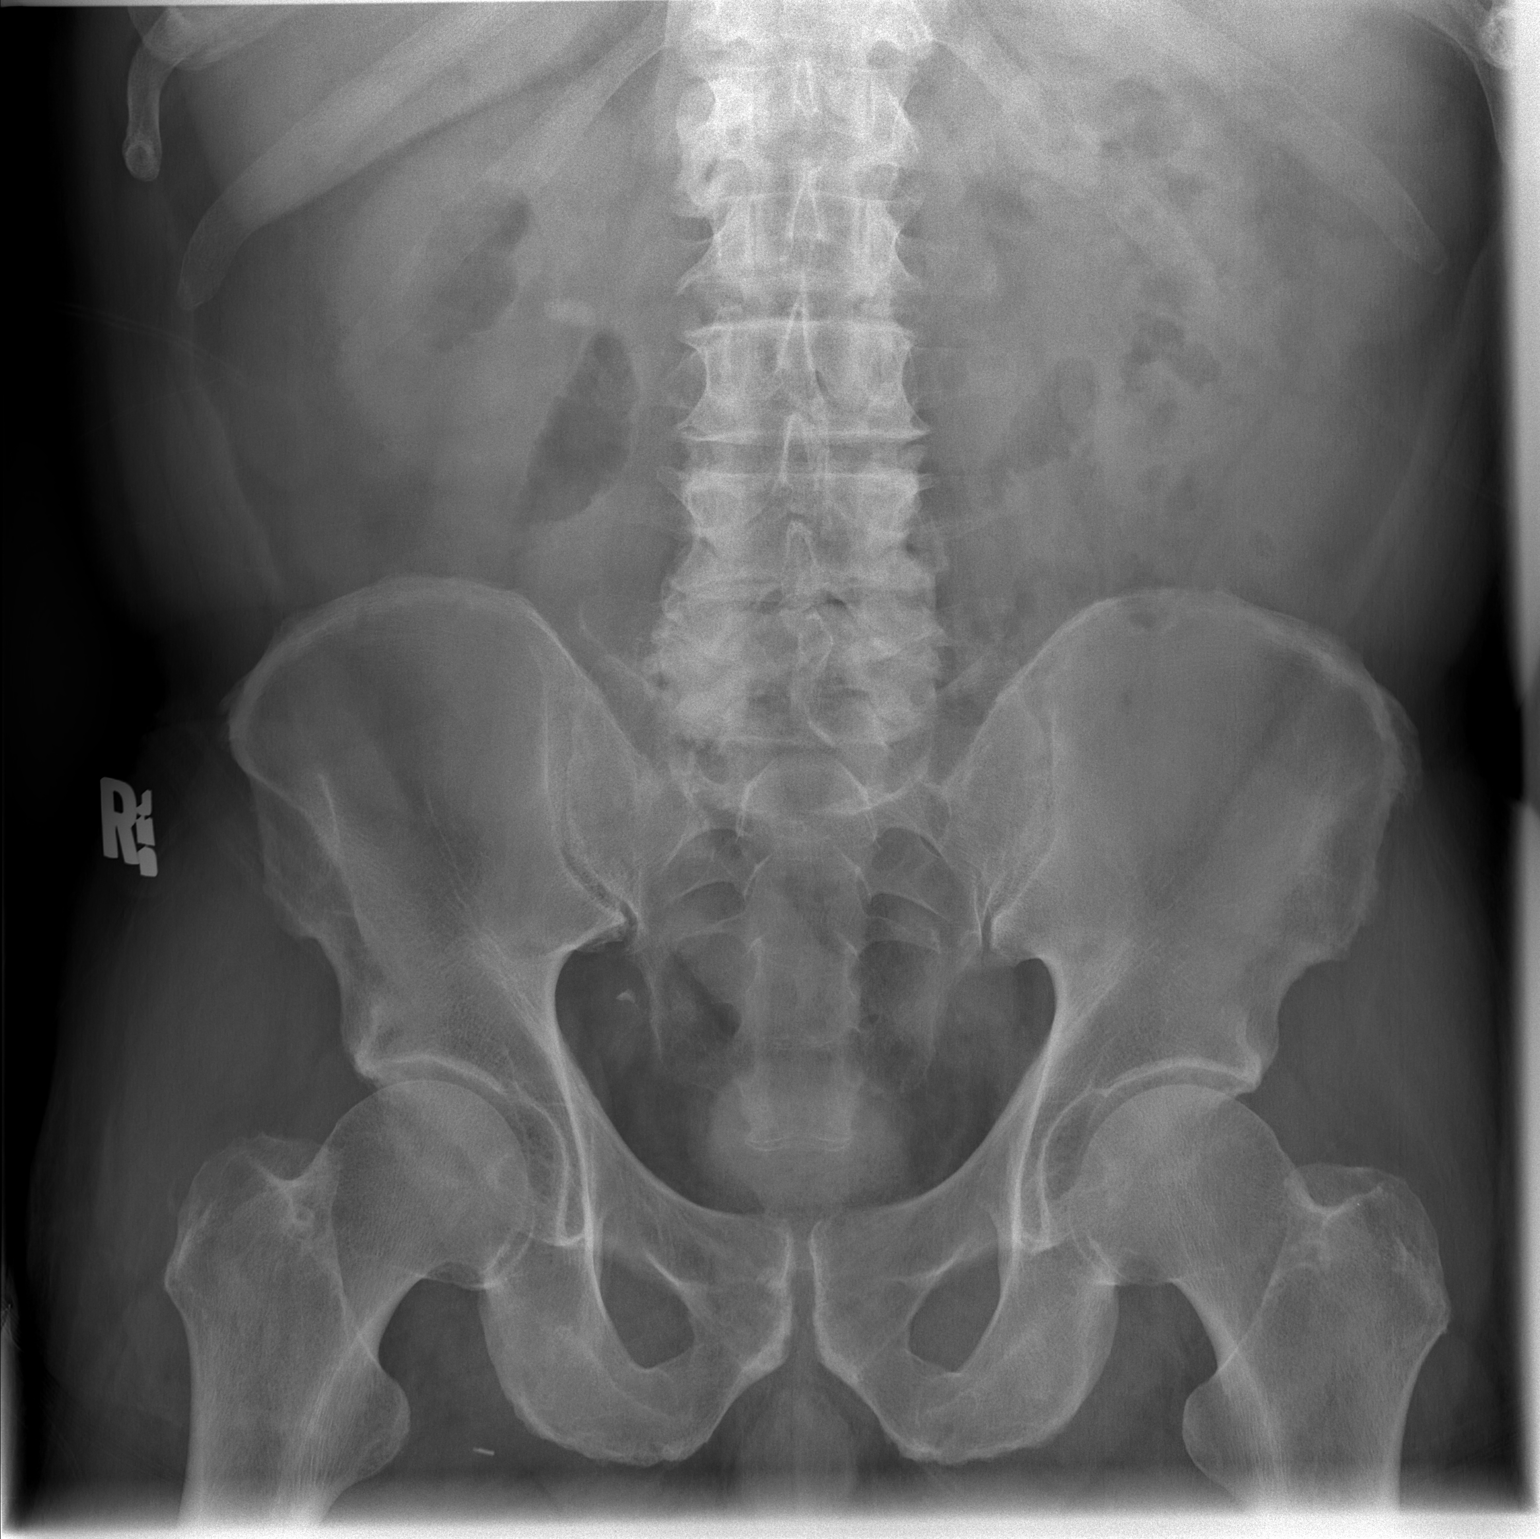

[1 of 1 positions shown; findings below may reference images not displayed]

FINDINGS: 12-13 millimeter calculus at the right renal hilum appears stable in
position from the CT yesterday. Mild pelvic vascular calcifications
superimposed. Negative visible bowel gas pattern and other abdominal
and pelvic visceral contours. No acute osseous abnormality
identified. Stable small right inguinal surgical clip.
IMPRESSION: Stable 12-13 millimeter calculus projecting at the right renal hilum
since the CT yesterday.

## 2018-10-23 SURGERY — LITHOTRIPSY, ESWL
Anesthesia: LOCAL | Laterality: Right

## 2018-10-23 MED ORDER — DIPHENHYDRAMINE HCL 25 MG PO CAPS
25.0000 mg | ORAL_CAPSULE | ORAL | Status: AC
  Administered 2018-10-23: 25 mg via ORAL
  Filled 2018-10-23: qty 1

## 2018-10-23 MED ORDER — CIPROFLOXACIN HCL 500 MG PO TABS
500.0000 mg | ORAL_TABLET | ORAL | Status: AC
  Administered 2018-10-23: 500 mg via ORAL
  Filled 2018-10-23: qty 1

## 2018-10-23 MED ORDER — SODIUM CHLORIDE 0.9 % IV SOLN
INTRAVENOUS | Status: DC
  Administered 2018-10-23: 08:00:00 via INTRAVENOUS

## 2018-10-23 MED ORDER — DIAZEPAM 5 MG PO TABS
10.0000 mg | ORAL_TABLET | ORAL | Status: AC
  Administered 2018-10-23: 10 mg via ORAL
  Filled 2018-10-23: qty 2

## 2018-10-23 MED ORDER — HYDROCODONE-ACETAMINOPHEN 5-325 MG PO TABS: 1.0000 | ORAL_TABLET | Freq: Four times a day (QID) | ORAL | 0 refills | Status: DC | PRN

## 2018-10-23 NOTE — Interval H&P Note (Signed)
History and Physical Interval Note:  10/23/2018 7:59 AM  Jordan [name]  has presented today for surgery, with the diagnosis of RIGHT RENAL PELVIS STONE  The various methods of treatment have been discussed with the patient and family. After consideration of risks, benefits and other options for treatment, the patient has consented to  Procedure(s): EXTRACORPOREAL SHOCK WAVE LITHOTRIPSY (ESWL) (Right) as a surgical intervention .  The patient's history has been reviewed, patient examined, no change in status, stable for surgery.  I have reviewed the patient's chart and labs.  Questions were answered to the patient's satisfaction.     Shonn Farruggia A Jaquin Coy

## 2018-10-24 ENCOUNTER — Encounter (HOSPITAL_COMMUNITY): Payer: Self-pay | Admitting: Urology

## 2018-11-06 ENCOUNTER — Other Ambulatory Visit: Payer: Self-pay | Admitting: *Deleted

## 2018-11-06 DIAGNOSIS — N200 Calculus of kidney: Secondary | ICD-10-CM | POA: Diagnosis not present

## 2018-11-06 MED ORDER — METOPROLOL TARTRATE 25 MG PO TABS: 12.5000 mg | ORAL_TABLET | Freq: Two times a day (BID) | ORAL | 0 refills | Status: DC

## 2018-11-10 DIAGNOSIS — N200 Calculus of kidney: Secondary | ICD-10-CM | POA: Diagnosis not present

## 2019-04-21 ENCOUNTER — Encounter: Payer: Self-pay | Admitting: Neurology

## 2019-04-21 DIAGNOSIS — I639 Cerebral infarction, unspecified: Secondary | ICD-10-CM

## 2019-04-21 HISTORY — DX: Cerebral infarction, unspecified: I63.9

## 2019-04-29 DIAGNOSIS — I10 Essential (primary) hypertension: Secondary | ICD-10-CM | POA: Diagnosis not present

## 2019-04-29 DIAGNOSIS — Z125 Encounter for screening for malignant neoplasm of prostate: Secondary | ICD-10-CM | POA: Diagnosis not present

## 2019-04-29 DIAGNOSIS — R7301 Impaired fasting glucose: Secondary | ICD-10-CM | POA: Diagnosis not present

## 2019-04-29 DIAGNOSIS — E7849 Other hyperlipidemia: Secondary | ICD-10-CM | POA: Diagnosis not present

## 2019-04-30 DIAGNOSIS — Z23 Encounter for immunization: Secondary | ICD-10-CM | POA: Diagnosis not present

## 2019-05-06 DIAGNOSIS — R7301 Impaired fasting glucose: Secondary | ICD-10-CM | POA: Diagnosis not present

## 2019-05-06 DIAGNOSIS — Z791 Long term (current) use of non-steroidal anti-inflammatories (NSAID): Secondary | ICD-10-CM | POA: Diagnosis not present

## 2019-05-06 DIAGNOSIS — I10 Essential (primary) hypertension: Secondary | ICD-10-CM | POA: Diagnosis not present

## 2019-05-06 DIAGNOSIS — M79642 Pain in left hand: Secondary | ICD-10-CM | POA: Diagnosis not present

## 2019-05-06 DIAGNOSIS — M65341 Trigger finger, right ring finger: Secondary | ICD-10-CM | POA: Diagnosis not present

## 2019-05-06 DIAGNOSIS — Z1331 Encounter for screening for depression: Secondary | ICD-10-CM | POA: Diagnosis not present

## 2019-05-06 DIAGNOSIS — I6529 Occlusion and stenosis of unspecified carotid artery: Secondary | ICD-10-CM | POA: Diagnosis not present

## 2019-05-06 DIAGNOSIS — Z0000 Encounter for general adult medical examination without abnormal findings: Secondary | ICD-10-CM | POA: Diagnosis not present

## 2019-05-06 DIAGNOSIS — R809 Proteinuria, unspecified: Secondary | ICD-10-CM | POA: Diagnosis not present

## 2019-05-06 DIAGNOSIS — J479 Bronchiectasis, uncomplicated: Secondary | ICD-10-CM | POA: Diagnosis not present

## 2019-05-06 DIAGNOSIS — R42 Dizziness and giddiness: Secondary | ICD-10-CM | POA: Diagnosis not present

## 2019-05-06 DIAGNOSIS — R251 Tremor, unspecified: Secondary | ICD-10-CM | POA: Diagnosis not present

## 2019-05-06 DIAGNOSIS — I2510 Atherosclerotic heart disease of native coronary artery without angina pectoris: Secondary | ICD-10-CM | POA: Diagnosis not present

## 2019-05-06 DIAGNOSIS — H9193 Unspecified hearing loss, bilateral: Secondary | ICD-10-CM | POA: Diagnosis not present

## 2019-05-06 DIAGNOSIS — M79641 Pain in right hand: Secondary | ICD-10-CM | POA: Diagnosis not present

## 2019-05-06 DIAGNOSIS — M5136 Other intervertebral disc degeneration, lumbar region: Secondary | ICD-10-CM | POA: Diagnosis not present

## 2019-05-08 ENCOUNTER — Other Ambulatory Visit: Payer: Self-pay | Admitting: Internal Medicine

## 2019-05-08 DIAGNOSIS — J479 Bronchiectasis, uncomplicated: Secondary | ICD-10-CM

## 2019-05-18 ENCOUNTER — Other Ambulatory Visit: Payer: Self-pay | Admitting: Internal Medicine

## 2019-05-18 ENCOUNTER — Other Ambulatory Visit (HOSPITAL_COMMUNITY): Payer: Self-pay | Admitting: Internal Medicine

## 2019-05-18 DIAGNOSIS — N200 Calculus of kidney: Secondary | ICD-10-CM | POA: Diagnosis not present

## 2019-05-18 DIAGNOSIS — R42 Dizziness and giddiness: Secondary | ICD-10-CM

## 2019-05-18 DIAGNOSIS — R310 Gross hematuria: Secondary | ICD-10-CM | POA: Diagnosis not present

## 2019-05-18 DIAGNOSIS — R251 Tremor, unspecified: Secondary | ICD-10-CM

## 2019-05-19 ENCOUNTER — Ambulatory Visit (HOSPITAL_COMMUNITY)
Admission: RE | Admit: 2019-05-19 | Discharge: 2019-05-19 | Disposition: A | Discharge status: Home / Self Care | Payer: Medicare Other | Source: Ambulatory Visit | Attending: Internal Medicine | Admitting: Internal Medicine

## 2019-05-19 ENCOUNTER — Other Ambulatory Visit: Payer: Self-pay

## 2019-05-19 DIAGNOSIS — R251 Tremor, unspecified: Secondary | ICD-10-CM | POA: Insufficient documentation

## 2019-05-19 DIAGNOSIS — R42 Dizziness and giddiness: Secondary | ICD-10-CM | POA: Diagnosis not present

## 2019-05-19 IMAGING — MR MR HEAD W/O CM
9 of 10 series · 33 of 48 positions shown · non-contrast
Comparison: No pertinent prior studies available for comparison.

CLINICAL DATA: Tremor, lightheadedness. Additional history: Patient
with left-sided extremity tremors for 2-3 months.

EXAM:
MRI HEAD WITHOUT CONTRAST
TECHNIQUE: Multiplanar, multiecho pulse sequences of the brain and surrounding
structures were obtained without intravenous contrast.

[Series 2: DWI · axial · 3.0mm · 0.94mm/px · z∈[-75,+84]mm · 8 of 108 slices shown (1 of 2)]
[im 1/108]
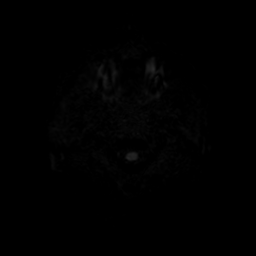
[im 12/108]
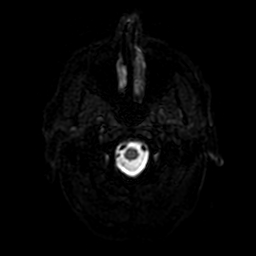
[im 36/108]
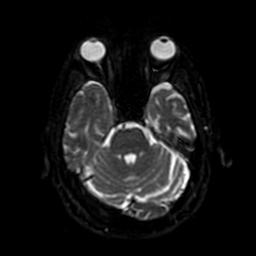
[im 48/108]
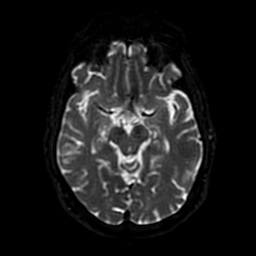
[im 60/108]
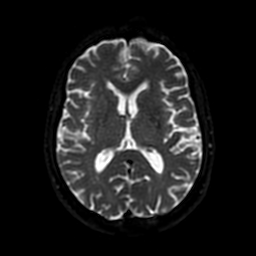
[im 72/108]
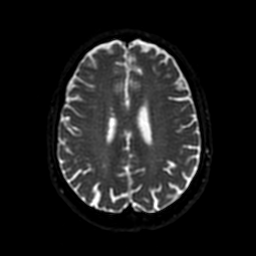
[im 96/108]
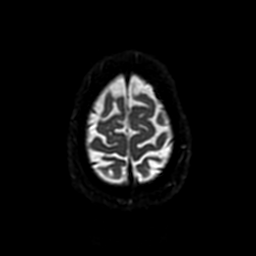
[im 108/108]
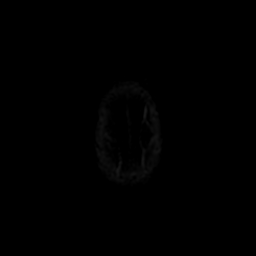

[Series 3: DWI · coronal · 4.0mm · 0.94mm/px · 6 of 76 slices shown (2 of 2)]
[im 1/76]
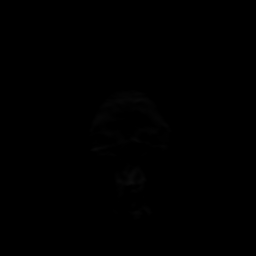
[im 16/76]
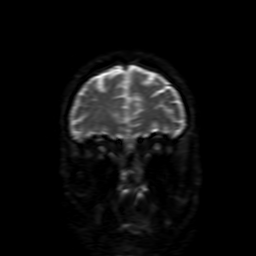
[im 31/76]
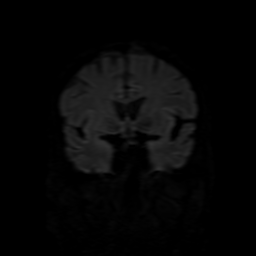
[im 46/76]
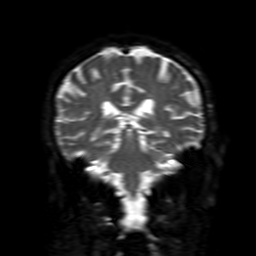
[im 61/76]
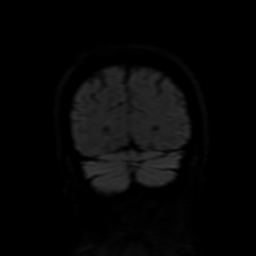
[im 76/76]
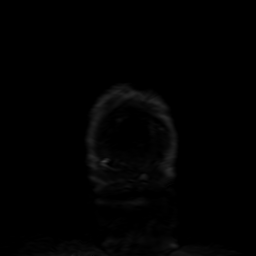

[Series 4: FLAIR · sagittal · 5.0mm · 0.47mm/px · 2 of 25 slices shown (1 of 2)]
[im 1/25]
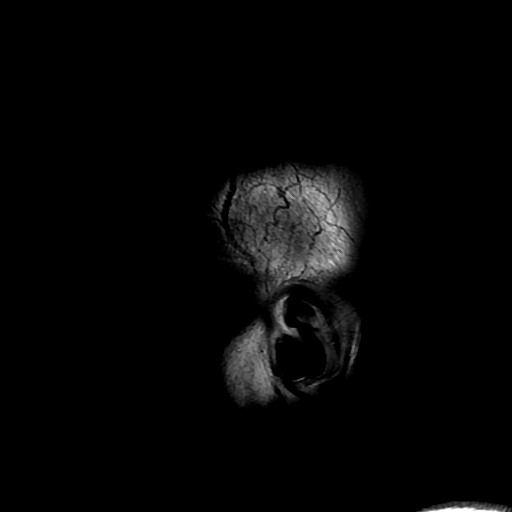
[im 25/25]
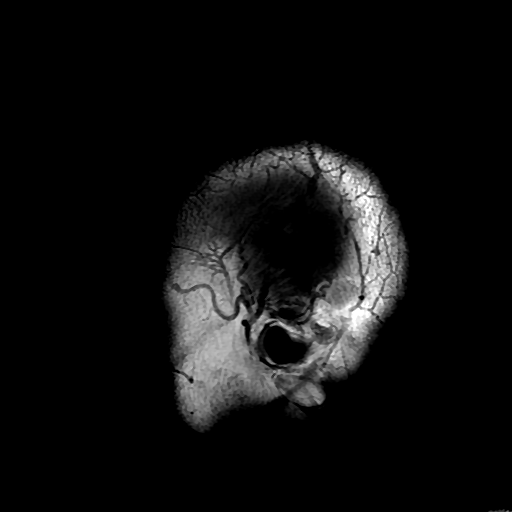

[Series 5: T2 · axial · 5.0mm · 0.47mm/px · z∈[-82,+86]mm · 2 of 29 slices shown (1 of 2)]
[im 1/29]
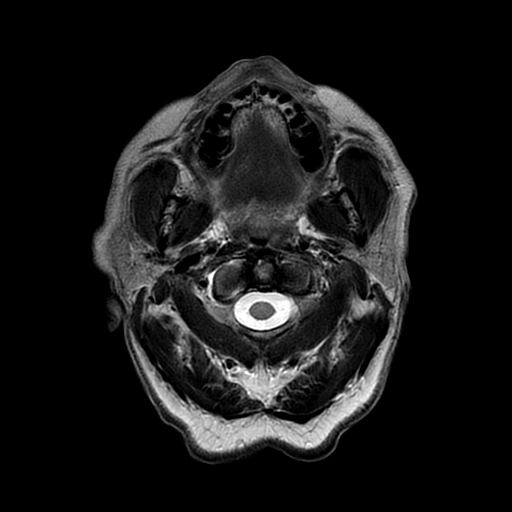
[im 29/29]
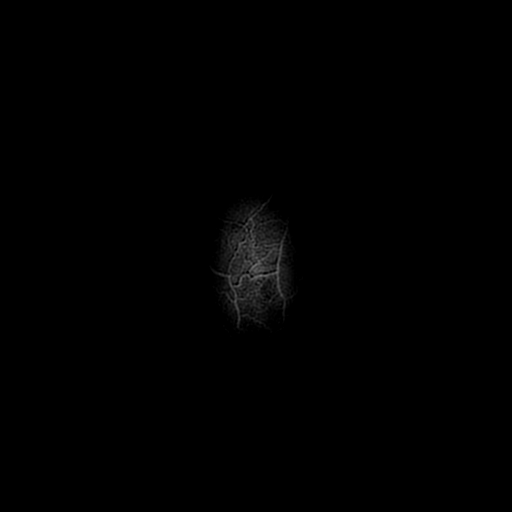

[Series 6: FLAIR · axial · 5.0mm · 0.47mm/px · z∈[-82,+86]mm · 2 of 29 slices shown (2 of 2)]
[im 1/29]
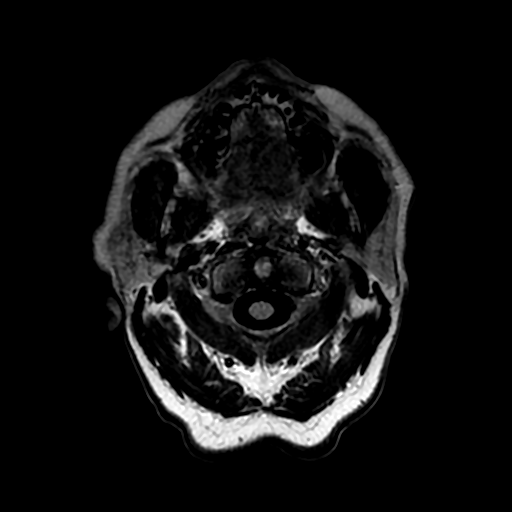
[im 29/29]
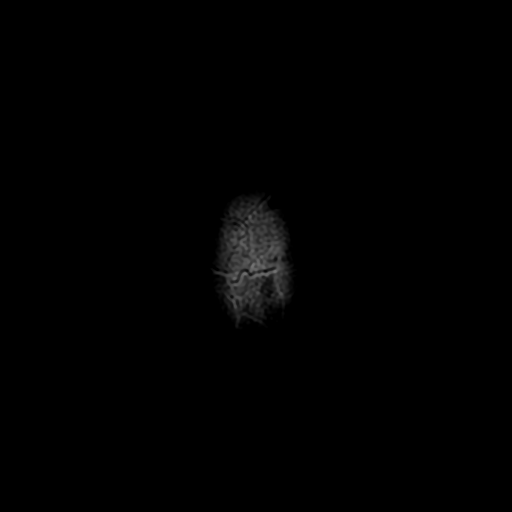

[Series 7: SWI · axial · 3.0mm · 0.47mm/px · z∈[-81,-63]mm · 2 of 116 slices shown]
[im 1/116]
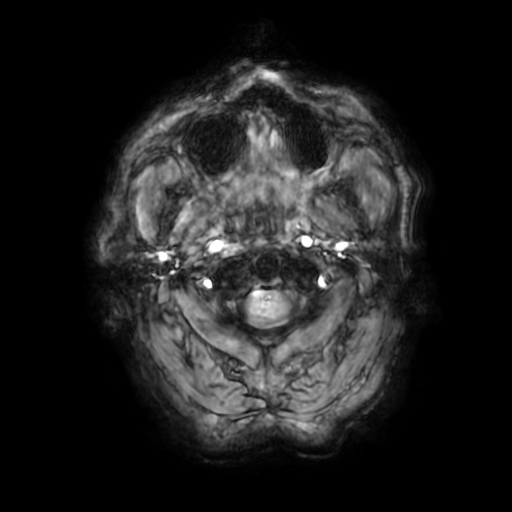
[im 13/116]
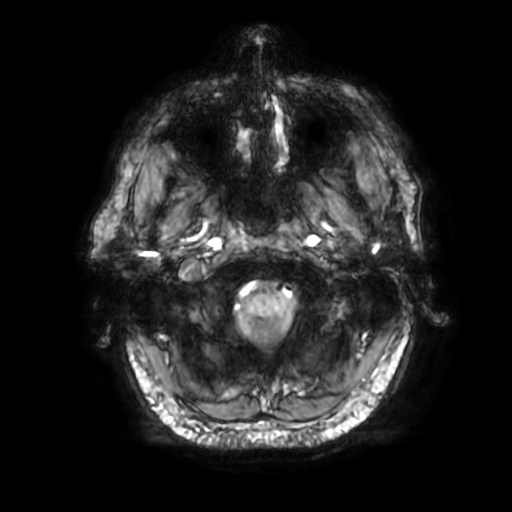

[Series 9: T2 · coronal · 5.0mm · 0.39mm/px · 3 of 34 slices shown (2 of 2)]
[im 1/34]
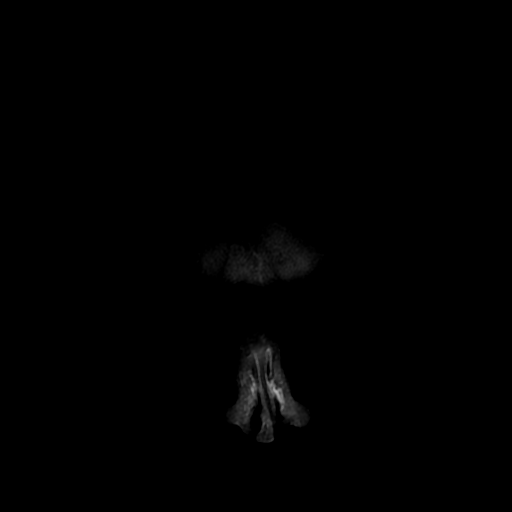
[im 17/34]
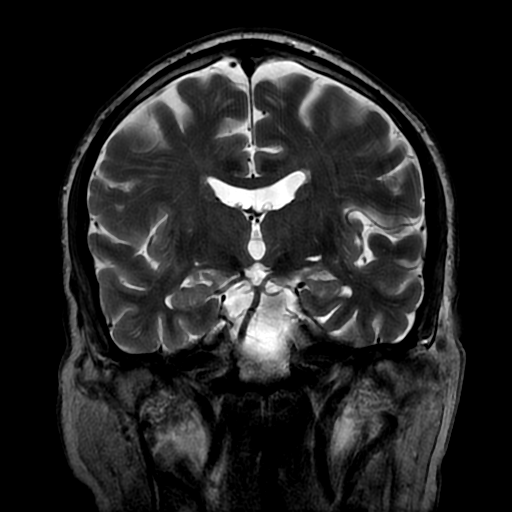
[im 34/34]
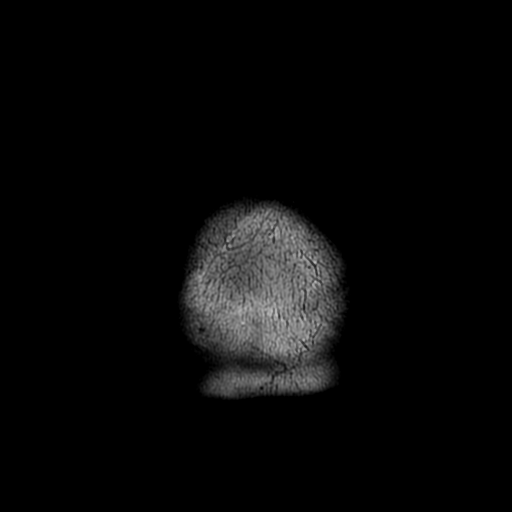

[Series 250: ADC · axial · 3.0mm · 0.94mm/px · z∈[-75,+84]mm · 5 of 54 slices shown (1 of 2)]
[im 1/54]
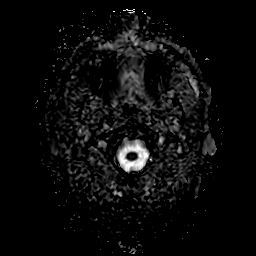
[im 14/54]
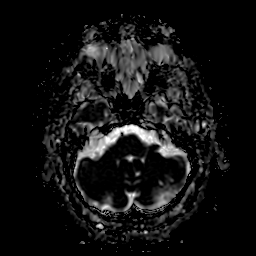
[im 27/54]
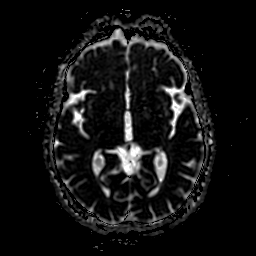
[im 40/54]
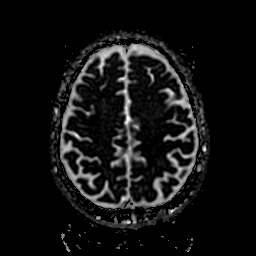
[im 54/54]
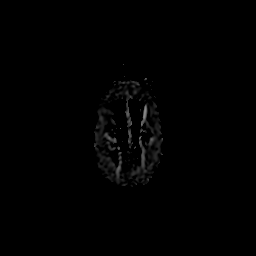

[Series 350: ADC · coronal · 4.0mm · 0.94mm/px · 3 of 38 slices shown (2 of 2)]
[im 1/38]
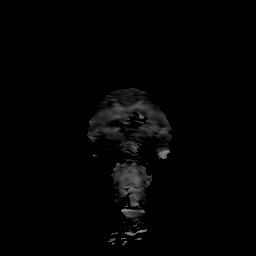
[im 19/38]
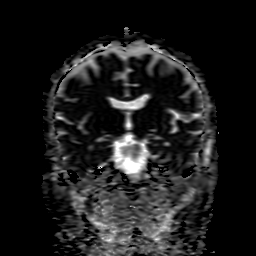
[im 38/38]
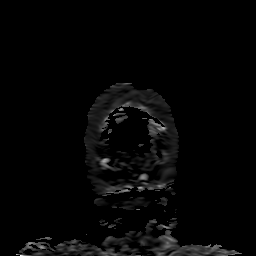

[33 of 48 positions shown; findings below may reference images not displayed]

FINDINGS: Brain:

There are multiple punctate foci of diffusion weighted
hyperintensity centered at the gray-white junction within the
bilateral cerebral hemispheres as follows. Punctate focus within the
right middle frontal gyrus (series 2, image 44). Punctate focus
within the right parietooccipital lobe (series 2, image 28).
Punctate focus within the left frontal lobe motor strip (series 2,
image 47). These foci are too small to accurately characterize on
the ADC map. Corresponding subtle T2/FLAIR hyperintensity at the
right frontoparietal and left frontal lobe motor strip sites.

Additional minimal scattered and ill-defined T2/FLAIR hyperintensity
within the cerebral white matter is nonspecific, but may reflect
sequela of chronic small vessel ischemic disease. Several small
chronic lacunar infarcts within the bilateral cerebellar
hemispheres.

No midline shift or extra-axial fluid collection.

No chronic intracranial blood products. Mild generalized parenchymal
atrophy.

Vascular: Flow voids maintained within the proximal large arterial
vessels. Mild fusiform dilation of the intracranial left vertebral
artery.

Skull and upper cervical spine: No focal marrow lesion

Sinuses/Orbits: Visualized orbits demonstrate no acute abnormality.
Minimal ethmoid sinus mucosal thickening. No significant mastoid
effusion

Findings included in impression #1 below were called by telephone at
the time of interpretation on [DATE] at [DATE] to provider MOSES
MOSES , who verbally acknowledged these results.
IMPRESSION: 1. Three punctate foci of diffusion weighted signal abnormality
centered at the gray-white junction within the right middle frontal
gyrus, within the right parietooccipital lobe, and within the left
frontal lobe motor strip. These foci are suspicious for
acute/subacute infarcts. Intracranial metastases are also a
differential consideration given multiplicity and location along the
gray-white junction. Clinical correlation is recommended. Consider
contrast-enhanced MRI follow-up to exclude intracranial metastatic
disease.
2. Additional mild signal changes within the cerebral white matter
are nonspecific, but may reflect sequela of chronic small vessel
ischemic disease.
3. Small bilateral chronic cerebellar lacunar infarcts.
4. Mild fusiform dilation of the intracranial left vertebral artery.

## 2019-05-20 ENCOUNTER — Other Ambulatory Visit (HOSPITAL_COMMUNITY): Payer: Self-pay | Admitting: Internal Medicine

## 2019-05-20 ENCOUNTER — Other Ambulatory Visit: Payer: Self-pay | Admitting: Internal Medicine

## 2019-05-20 ENCOUNTER — Ambulatory Visit (HOSPITAL_COMMUNITY)
Admission: RE | Admit: 2019-05-20 | Discharge: 2019-05-20 | Disposition: A | Discharge status: Home / Self Care | Payer: Medicare Other | Source: Ambulatory Visit | Attending: Family | Admitting: Family

## 2019-05-20 DIAGNOSIS — R9089 Other abnormal findings on diagnostic imaging of central nervous system: Secondary | ICD-10-CM

## 2019-05-20 DIAGNOSIS — I10 Essential (primary) hypertension: Secondary | ICD-10-CM | POA: Diagnosis not present

## 2019-05-20 DIAGNOSIS — E7849 Other hyperlipidemia: Secondary | ICD-10-CM | POA: Diagnosis not present

## 2019-05-20 DIAGNOSIS — I6340 Cerebral infarction due to embolism of unspecified cerebral artery: Secondary | ICD-10-CM

## 2019-05-21 ENCOUNTER — Ambulatory Visit (HOSPITAL_BASED_OUTPATIENT_CLINIC_OR_DEPARTMENT_OTHER): Payer: Medicare Other

## 2019-05-21 ENCOUNTER — Other Ambulatory Visit: Payer: Self-pay

## 2019-05-21 DIAGNOSIS — Z79899 Other long term (current) drug therapy: Secondary | ICD-10-CM | POA: Diagnosis not present

## 2019-05-21 DIAGNOSIS — I6340 Cerebral infarction due to embolism of unspecified cerebral artery: Secondary | ICD-10-CM

## 2019-05-21 DIAGNOSIS — R9089 Other abnormal findings on diagnostic imaging of central nervous system: Secondary | ICD-10-CM | POA: Diagnosis not present

## 2019-05-21 DIAGNOSIS — I10 Essential (primary) hypertension: Secondary | ICD-10-CM | POA: Diagnosis not present

## 2019-05-21 NOTE — Progress Notes (Addendum)
Jordan [name] was seen today in the movement disorders clinic for neurologic consultation at the request of Crist Infante, MD.  The consultation is for the evaluation of L hand tremor.  Tremor: Yes.     How long has it been going on? 2-3 months - started acutely one day.  States that he had a bad fall right before this started (about 3 weeks) - he isn't sure if he hit his head  At rest or with activation?  rest  When is it noted the most?  rest  Fam hx of tremor?  No.  Located where?  L arm and L leg  Affected by caffeine:   (just went off of it week ago)  Affected by alcohol:  No.  Affected by stress:  No.  Affected by fatigue:  Yes.    Tremor inducing meds:  No.  Other Specific Symptoms:  Voice: no change in voice but some word finding troubling Sleep: sleeps well  Vivid Dreams:  No.  Acting out dreams:  No. Wet Pillows: No. Postural symptoms:  No.  Falls?  No. Bradykinesia symptoms: no bradykinesia noted Loss of smell:  No. Loss of taste:  No. Urinary Incontinence:  No. Difficulty Swallowing:  Yes.  , but "I have had that problem for a while."  Relates to GERD  Handwriting, micrographia: No. Trouble with ADL's:  No.  Trouble buttoning clothing: No. Depression:  No. Memory changes:  No. Hallucinations:  No.  visual distortions: No. N/V:  No. Lightheaded:  Some (gets vertigo with caffeine but he just stopped that)  Syncope: No. Diplopia:  No. Dyskinesia:  No.  Neuroimaging of the brain has  previously been performed.  MRI of the brain was completed on May 19, 2019.  I personally reviewed this.  There were multiple punctate T2 hyperintensities that were acute/subacute/positive on DWI.  These were located all at the gray-white junction.  1 of these was in the right middle frontal gyrus, 1 within the right parieto-occipital lobe and one within the left frontal motor strip.  Patient was sent back for MRI with contrast.  MRI brain with contrast was done on 05/22/19  demonstrated enhancement of the right parieto-occipital lesion.  It was felt that metastatic disease was in the differential, but so was subacute infarct with enhancement.  The frontal lesions did not enhance.   ALLERGIES:   Allergies  Allergen Reactions   Crestor [Rosuvastatin Calcium] Other (See Comments)    Muscle aches     CURRENT MEDICATIONS:  Current Outpatient Medications  Medication Instructions   Cyanocobalamin (VITAMIN B-12 PO) 1 tablet, Oral, Every evening   EPINEPHrine 0.3 mg/0.3 mL IJ SOAJ injection USE AS DIRECTED AS NEEDED FOR SEVERE ALLERGIC REACTION   ezetimibe (ZETIA) 10 mg, Oral, Every evening   famotidine (PEPCID) 10 mg, Oral, 2 times daily   metoprolol tartrate (LOPRESSOR) 12.5 mg, Oral, 2 times daily   pravastatin (PRAVACHOL) 20 MG tablet No dose, route, or frequency recorded.   tadalafil (CIALIS) 20 mg, Oral, Daily PRN   valsartan (DIOVAN) 80 mg, Oral, Daily    PAST MEDICAL HISTORY:   Past Medical History:  Diagnosis Date   DDD (degenerative disc disease)    Dyspnea    ED (erectile dysfunction)    GERD (gastroesophageal reflux disease)    History of hiatal hernia    History of kidney stones    passed stones - no surgery required   HTN (hypertension)    Hyperlipidemia    Impaired fasting  glucose    Knee pain    bilateral   Left main coronary artery disease 03/18/2017   Cardiac Cath: 55% dLM, tandem p-mLAD 75% --> referred for CABG.   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 Cyndia Bent @ [facility]):  LIMA-LAD, SVG-D2, SVG-OM, SVG-PDA (endoscopic SVG harvesting from right leg).   Shoulder pain    Wears hearing aid     PAST SURGICAL HISTORY:   Past Surgical History:  Procedure Laterality Date   Cardiac Stress Test  2008   Normal   CARPAL TUNNEL RELEASE Left 1998   CARPAL  TUNNEL RELEASE Right 2008   COLONOSCOPY  06/2013   Henrene Pastor - polyps   CORONARY ARTERY BYPASS GRAFT N/A 04/04/2017   Procedure: CORONARY ARTERY BYPASS GRAFTING x 4  LIMA-LAD, SVG-D2, SVG-OM, SVG-PDA (endoscopic SVG harvesting from right leg);  Surgeon: Gaye Pollack, MD;  Location: [place];  Service: Open Heart Surgery;  Laterality: N/A;   EXTRACORPOREAL SHOCK WAVE LITHOTRIPSY Right 10/23/2018   Procedure: EXTRACORPOREAL SHOCK WAVE LITHOTRIPSY (ESWL);  Surgeon: Bjorn Loser, MD;  Location: [facility];  Service: Urology;  Laterality: Right;   HERNIA REPAIR Left    inguinal   HIATAL HERNIA REPAIR     no surgery per pt   KNEE CARTILAGE SURGERY Bilateral    LEFT HEART CATH AND CORONARY ANGIOGRAPHY N/A 03/18/2017   Procedure: Left Heart Cath and Coronary Angiography;  Surgeon: Leonie Man, MD;  Location: [place] [facility];  Service: Cardiovascular:  55% dLM, p-mLAD 75% - mLAD 75%.  EF 55-65%. Mod non-obstructive RCA disease --> referred for CABG   MEDIAL PARTIAL KNEE REPLACEMENT Right 2010   Partial right knee replacement   NM MYOVIEW LTD  01/2017   INTERMEDIATE RISK: Exercise tolerance was good at 10 minutes, however, fatigue and dyspnea were reporte-d -> hypertensive response to exercise.  68mm Horizontal ST depressions noted during stress in the II, III, aVF, V6, V5 and V4 leads c/w ischemia.   Small siize, moderate severity perfusion defect  mid to distal anterior wall perfusion defect suggestive of ischemia.Marland Kitchen    ROTATOR CUFF REPAIR Left 2008   SHOULDER ARTHROSCOPY W/ ROTATOR CUFF REPAIR Right 06/15/2016   TEE WITHOUT CARDIOVERSION N/A 04/04/2017   Procedure: TRANSESOPHAGEAL ECHOCARDIOGRAM (TEE);  Surgeon: Gaye Pollack, MD;  Location: [place];  Service: Open Heart Surgery;  Laterality: N/A;   TONSILLECTOMY      SOCIAL HISTORY:   Social History   Socioeconomic History   Marital status: Married    Spouse name: Not on file   Number of children: 2   Years of education: Not  on file   Highest education level: Master's degree (e.g., MA, MS, MEng, MEd, MSW, MBA)  Occupational History   Occupation: retired    Comment: Environmental consultant strain: Not on file   Food insecurity    Worry: Not on file    Inability: Not on Lexicographer needs    Medical: Not on file    Non-medical: Not on file  Tobacco Use   Smoking status: Never Smoker   Smokeless tobacco: Never Used  Substance and Sexual Activity   Alcohol use: Yes    Comment: 1 drink a week (decreased it when wife quit driinking) -  stopped that 2 months ago   Drug use: No   Sexual activity: Yes  Lifestyle   Physical activity    Days per week: Not on file    Minutes per session: Not on file   Stress: Not on file  Relationships   Social connections    Talks on phone: Not on file    Gets together: Not on file    Attends religious service: Not on file    Active member of club or organization: Not on file    Attends meetings of clubs or organizations: Not on file    Relationship status: Not on file   Intimate partner violence    Fear of current or ex partner: Not on file    Emotionally abused: Not on file    Physically abused: Not on file    Forced sexual activity: Not on file  Other Topics Concern   Not on file  Social History Narrative   Jordan [name] is a very pleasant married gentleman with 2 children [name] and Frederick) each with 3 children/total 6 grandchildren. He is currently retired now from his original job in [employer], however he still serves on [employer] at [employer] and recently stepped down from [employer] at [employer].   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    For the last several months he has been off caffeine and wine because of GI upset symptoms and palpitations. Also trying to lose weight.     He used to drink maybe 2 glasses of wine at night, and he is subsequently all quit  alcohol.   He is relatively active with exercise but not with routine pattern for standard routine. He does push AND other calisthenics as well as kayaking and bike riding.     FAMILY HISTORY:   Family Status  Relation Name Status   Mother  Deceased at age 73   Father  Deceased at age 94   Brother  15   Son  Alive   Daughter  Alive   Neg Hx  (Not Specified)    ROS:  Review of Systems  Constitutional: Negative.   HENT: Negative.   Eyes: Negative.   Respiratory: Positive for shortness of breath (with exertion).   Cardiovascular: Negative.   Gastrointestinal: Negative.   Skin: Negative.   Neurological: Positive for focal weakness (L hand with decreased grasp).  Endo/Heme/Allergies: Negative.     PHYSICAL EXAMINATION:    VITALS:   Vitals:   05/25/19 0908  BP: (!) 161/76  Pulse: 72  SpO2: 95%  Weight: 175 lb (79.4 kg)  Height: 5\' 7"  (1.702 m)    GEN:  The patient appears stated age and is in NAD. HEENT:  Normocephalic, atraumatic.  The mucous membranes are moist. The superficial temporal arteries are without ropiness or tenderness. CV:  RRR Lungs:  CTAB Neck/HEME:  There are no carotid bruits bilaterally.  Neurological examination:  Orientation: The patient is alert and oriented x3. Fund of knowledge is appropriate.  Recent and remote memory are intact.  Attention and concentration are normal.    Able to name objects and repeat phrases. Cranial nerves: There is good facial symmetry. Pupils are equal round and reactive to light bilaterally. Fundoscopic exam reveals clear margins bilaterally. Extraocular muscles are intact. The visual fields are full to confrontational testing. The speech is fluent and clear. Soft palate rises symmetrically and there is no tongue deviation. Hearing is  intact to conversational tone. Sensation: Sensation is intact to light and pinprick throughout (facial, trunk, extremities). Vibration is decreased at the bilateral big toe. There is  no extinction with double simultaneous stimulation. There is no sensory dermatomal level identified. Motor: Strength is 5/5 in the bilateral upper and lower extremities.   Shoulder shrug is equal and symmetric.  There is no pronator drift. Deep tendon reflexes: Deep tendon reflexes are 0-1/4 at the bilateral biceps, triceps, brachioradialis, patella and achilles. Plantar responses are downgoing bilaterally.  Movement examination: Tone: There is normal tone in the bilateral upper extremities.  The tone in the lower extremities is normal.  Abnormal movements: there is choreiform movements of the L arm/L leg Coordination:  There is no decremation with RAM's, with any form of RAMS, including alternating supination and pronation of the forearm, hand opening and closing, finger taps, heel taps and toe taps. Gait and Station: The patient has no difficulty arising out of a deep-seated chair without the use of the hands. The patient's stride length is good with exaggerated arm swing on the L due to chorea.       Labs: Lab work is received and dated May 20, 2019.  White blood cells were 7.825, hemoglobin 15.7, hematocrit 46.8 and platelets 145.  TSH was 1.93.  Sodium was 142, potassium 5.1, chloride 108, CO2 26, BUN 24, creatinine 1.0, glucose 96  Addendum labs:  Lab work is received from primary care physician dated May 26, 2019.  Sodium was 141, potassium 4.6, chloride 109, CO2 25, BUN 19, creatinine 0.9.  White blood cells 5.6, hemoglobin 15.5, hematocrit 46.0 and platelets 178.  Total cholesterol was 133.  LDL 74, HDL 36.  TSH 2.25. ASSESSMENT/PLAN:  1.  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  This is usually do to a structural lesion, most often acute infarct.  While he does have acute infarct on the MRI, this is not within the basal ganglia.  It is possible that the frontal lesion on the right could cause this, but I think not that probable. I called his PCP to see if he wanted me to go ahead and  work-up sources for this, or waiting until we repeat his MRI of the brain.  Crist Infante, MD was out of the office, but I spoke to Dr. Brigitte Pulse.  He told me to start the work up.  I will do that.  May need CT chest/abdomen/pelvis as well and Dr. Brigitte Pulse was going to talk with Dr. Joylene Draft about this.  - labs today:  ceruloplasmin, copper, PTH, ferritin, sedimentation rate, ANA, antiphospholipid antibody, lupus anticoagulant, RPR, B12, antigliadin antibody, Athena labs: Paraneoplastic antibody panel,    2.  Abnormal MRI (ddx is between multiple new territory infarcts and metastatic ds)  -Patient has multiple punctate T2 hyperintensities that are acute/subacute/positive on DWI.  1 of these was contrast-enhancing.  This can happen in both metastatic disease as well as stroke, although if it was metastatic disease, you would expect that all of these would contrast-enhanced.  He is going to need very close follow-up with MRI, perhaps in the next 4 to 6 weeks.  -In the meantime, his primary care has already started the work-up for multiple vascular territory infarcts.  Echocardiogram was done on May 21, 2019.  Ejection fraction was 60 to 65%.  There was mild to moderate aortic valve sclerosis, but no stenosis.  It was otherwise fairly unremarkable.  Likely will need TEE.  He already has an appt with cardiology this afternoon  -  talked to him about loop recorder and pt states that he already has that scheduled  -carotid u/s done on September 30 revealed less than 1 to 39% stenosis bilaterally.  -start asa, 81 mg daily (pt states that he just started back to that)  -pt reports that fasting lipids done a month ago.  Will try to get a copy.  Goal LDL is less than 70.    3.  F/u 8 weeks.  Much greater than 50% of this visit was spent in counseling and coordinating care.  Total face to face time:  60 min   Cc:  Crist Infante, MD

## 2019-05-22 ENCOUNTER — Ambulatory Visit (HOSPITAL_COMMUNITY)
Admission: RE | Admit: 2019-05-22 | Discharge: 2019-05-22 | Disposition: A | Discharge status: Home / Self Care | Payer: Medicare Other | Source: Ambulatory Visit | Attending: Internal Medicine | Admitting: Internal Medicine

## 2019-05-22 DIAGNOSIS — R9089 Other abnormal findings on diagnostic imaging of central nervous system: Secondary | ICD-10-CM | POA: Diagnosis not present

## 2019-05-22 DIAGNOSIS — I10 Essential (primary) hypertension: Secondary | ICD-10-CM | POA: Insufficient documentation

## 2019-05-22 DIAGNOSIS — G939 Disorder of brain, unspecified: Secondary | ICD-10-CM | POA: Diagnosis not present

## 2019-05-22 DIAGNOSIS — I6340 Cerebral infarction due to embolism of unspecified cerebral artery: Secondary | ICD-10-CM | POA: Insufficient documentation

## 2019-05-22 DIAGNOSIS — Z79899 Other long term (current) drug therapy: Secondary | ICD-10-CM | POA: Diagnosis not present

## 2019-05-22 LAB — POCT I-STAT CREATININE: Creatinine, Ser: 0.9 mg/dL (ref 0.61–1.24)

## 2019-05-22 IMAGING — MR MR HEAD W/ CM
5 of 6 series · 19 of 48 positions shown · IV contrast (gadavist)
Comparison: MRI brain without contrast [DATE]

CLINICAL DATA: Follow-up abnormal MRI. Rule out infarct versus
metastatic disease.

EXAM:
MRI HEAD WITH CONTRAST
TECHNIQUE: Multiplanar, multiecho pulse sequences of the brain and surrounding
structures were obtained with intravenous contrast.
CONTRAST:  8mL GADAVIST GADOBUTROL 1 MMOL/ML IV SOLN

[Series 3: T1 · axial · 1.0mm · 0.47mm/px · 1 of 170 slices shown]
[im 10/170]
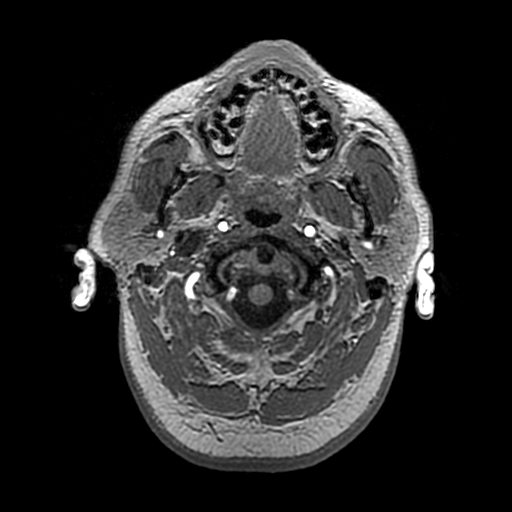

[Series 6: T2 post-contrast · coronal · 5.0mm · 0.45mm/px · 3 of 28 slices shown]
[im 1/28]
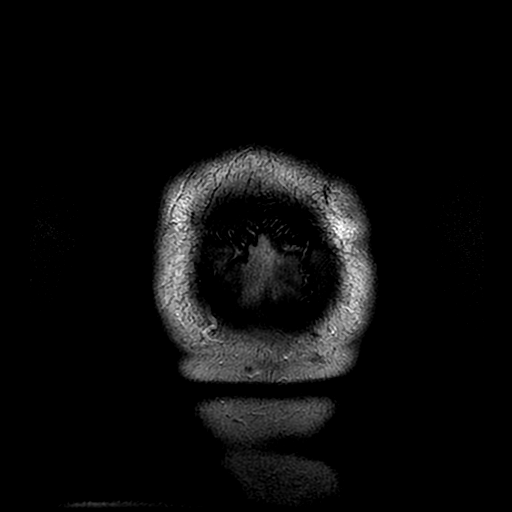
[im 14/28]
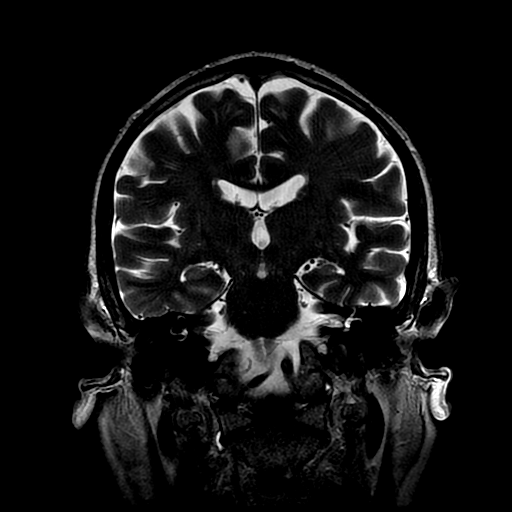
[im 28/28]
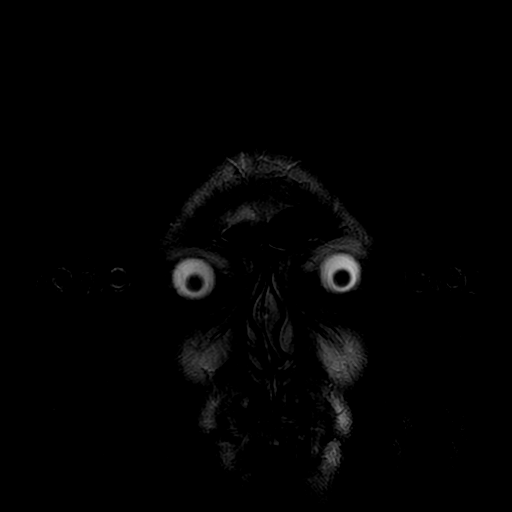

[Series 7: T1 post-contrast · axial · 1.0mm · 0.47mm/px · z∈[-22,+118]mm · 10 of 164 slices shown (1 of 3)]
[im 10/164]
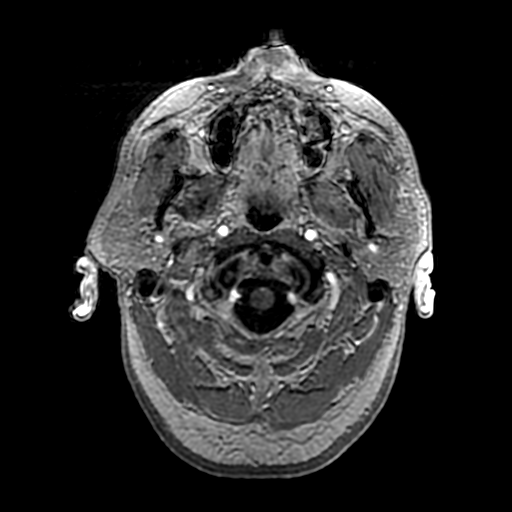
[im 29/164]
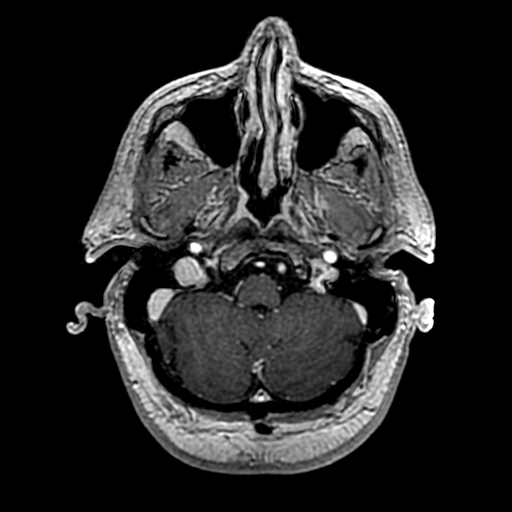
[im 48/164]
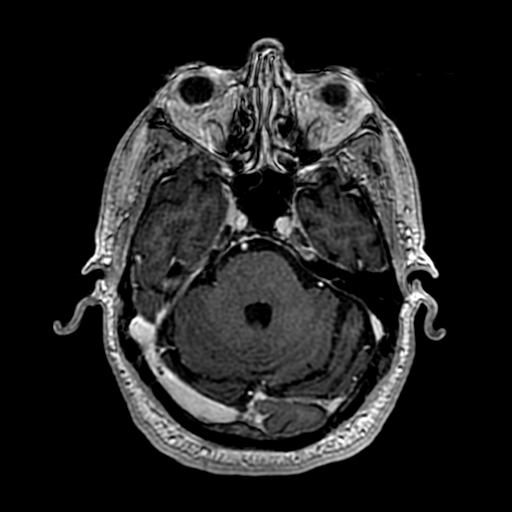
[im 68/164]
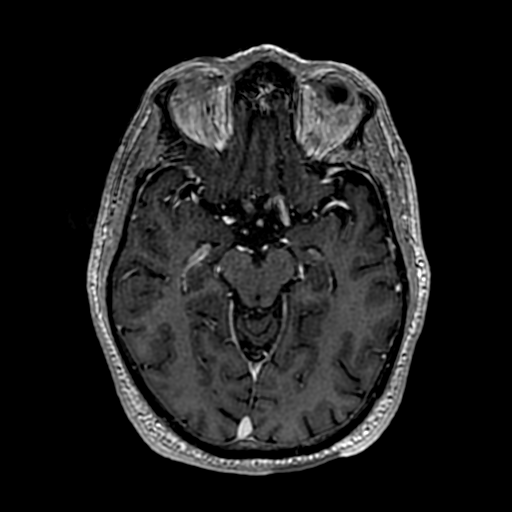
[im 87/164]
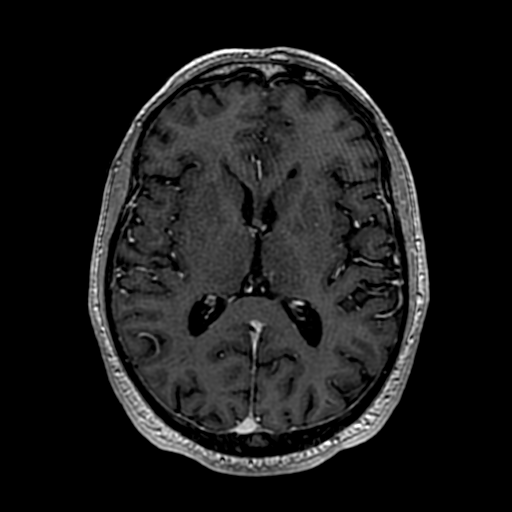
[im 96/164]
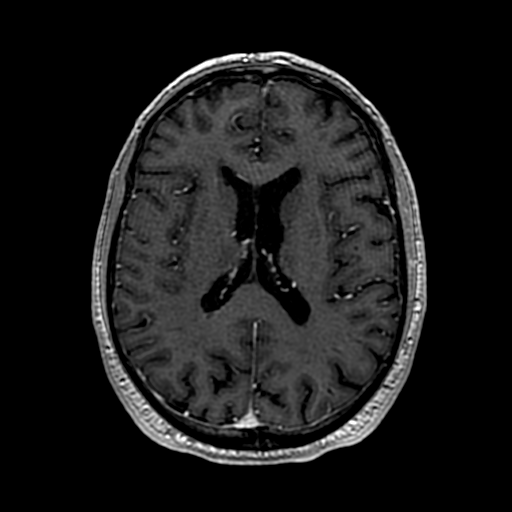
[im 116/164]
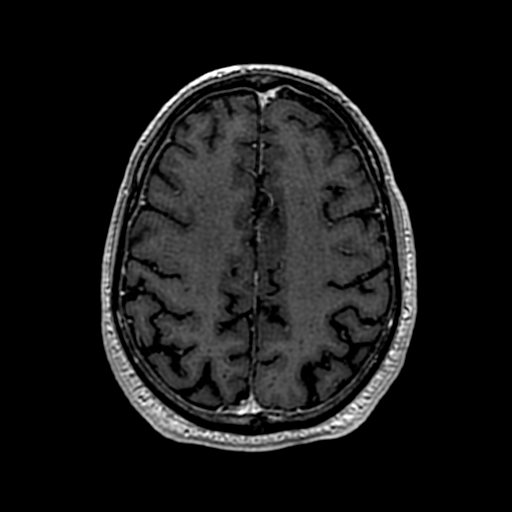
[im 135/164]
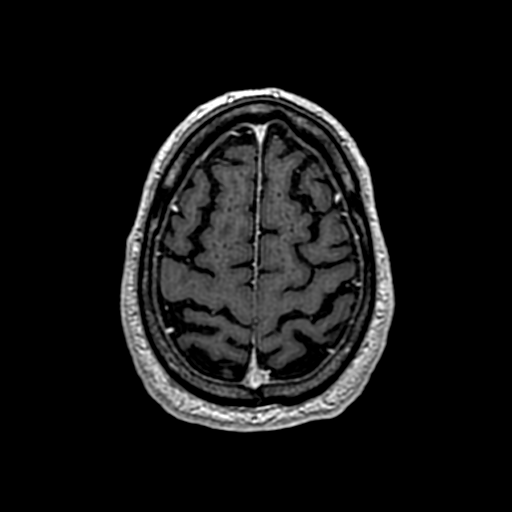
[im 144/164]
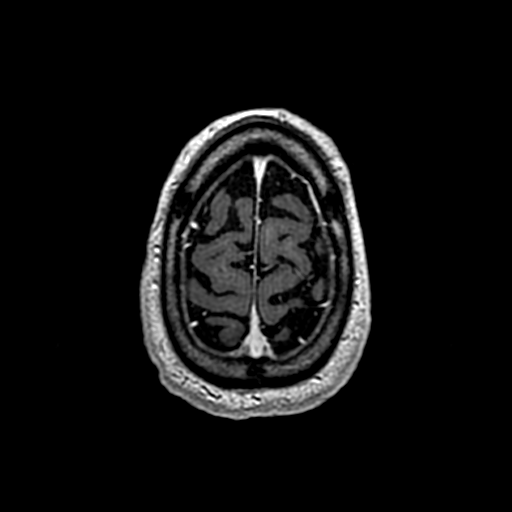
[im 154/164]
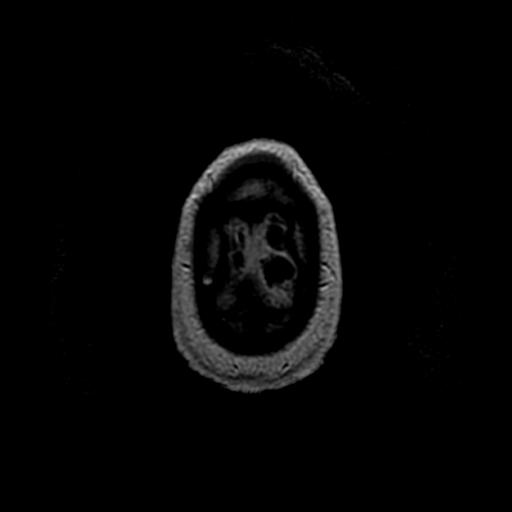

[Series 8: T1 post-contrast · coronal · 5.0mm · 0.43mm/px · 3 of 28 slices shown (2 of 3)]
[im 1/28]
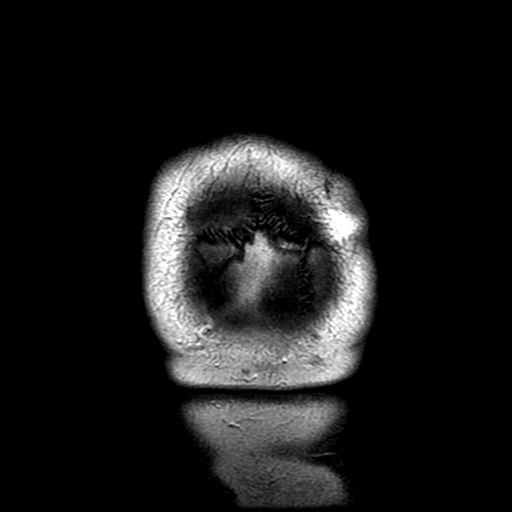
[im 14/28]
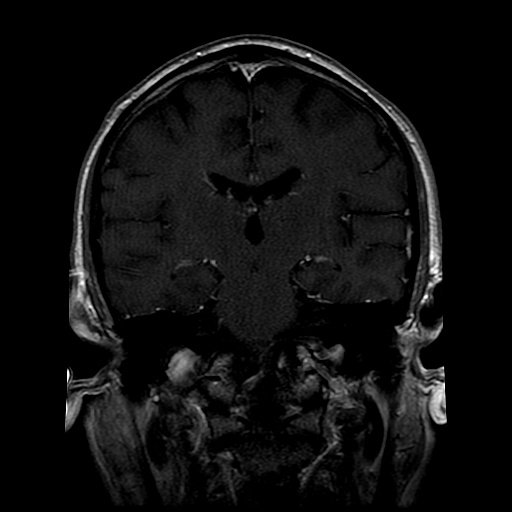
[im 28/28]
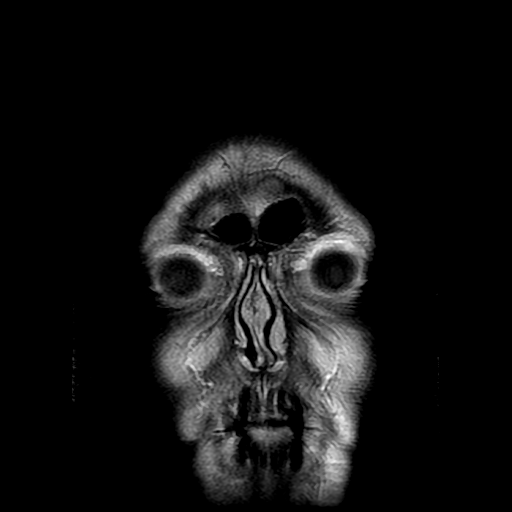

[Series 9: T1 post-contrast · sagittal · 5.0mm · 0.47mm/px · 2 of 22 slices shown (3 of 3)]
[im 1/22]
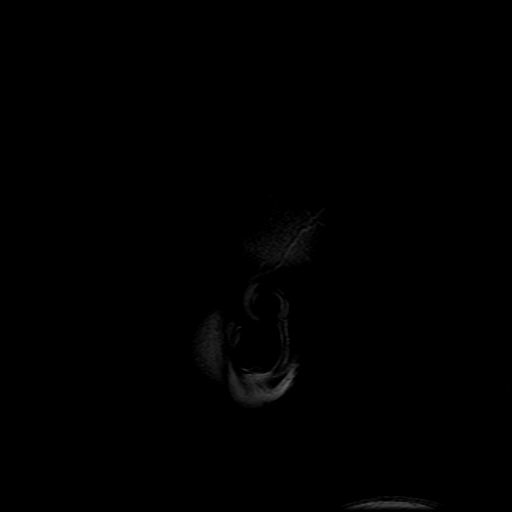
[im 22/22]
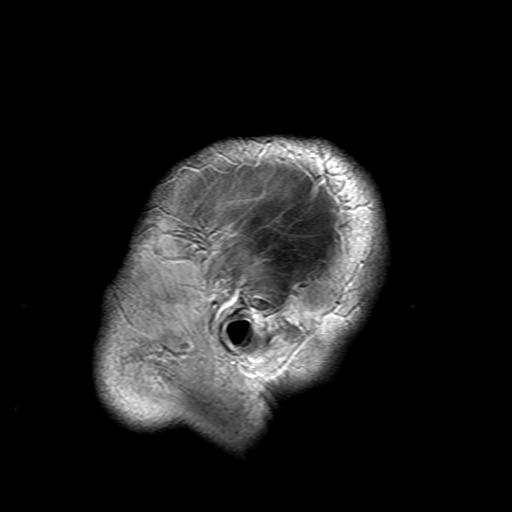

[19 of 48 positions shown; findings below may reference images not displayed]

FINDINGS: Three small areas of restricted diffusion are noted on the recent
MRI

Lesion in the right occipital parietal lobe shows punctate
enhancement measuring 2 mm on axial image 79. This showed restricted
diffusion on the prior study.

Small area of restricted diffusion in the right mid frontal cortex
does not show abnormal enhancement.

Small area of restricted diffusion in the left prefrontal cortex
shows a small area of enhancement which I believe is most likely
vascular enhancement.

No other enhancing lesions in the brain.

No enhancing calvarial lesions identified.
IMPRESSION: Right occipital parietal lesion does show enhancement. This lesion
measures 2 mm in diameter and is indeterminate. Metastatic disease
is in the differential. Subacute infarct with enhancement is
possible. Close follow-up recommended. The patient has no known
history of malignancy therefore metastatic disease is considered
less likely.

Additional frontal lesions bilaterally do not show abnormal
enhancement and likely are areas of infarct.

Follow-up MRI without with contrast suggested in 4-6 weeks.

## 2019-05-22 MED ORDER — GADOBUTROL 1 MMOL/ML IV SOLN
8.0000 mL | Freq: Once | INTRAVENOUS | Status: AC | PRN
  Administered 2019-05-22: 07:00:00 8 mL via INTRAVENOUS

## 2019-05-25 ENCOUNTER — Other Ambulatory Visit (INDEPENDENT_AMBULATORY_CARE_PROVIDER_SITE_OTHER): Payer: Medicare Other

## 2019-05-25 ENCOUNTER — Ambulatory Visit (INDEPENDENT_AMBULATORY_CARE_PROVIDER_SITE_OTHER): Payer: Medicare Other | Admitting: Neurology

## 2019-05-25 ENCOUNTER — Encounter: Payer: Self-pay | Admitting: Neurology

## 2019-05-25 ENCOUNTER — Other Ambulatory Visit: Payer: Self-pay

## 2019-05-25 VITALS — BP 161/76 | HR 72 | Ht 67.0 in | Wt 175.0 lb

## 2019-05-25 DIAGNOSIS — R5383 Other fatigue: Secondary | ICD-10-CM | POA: Diagnosis not present

## 2019-05-25 DIAGNOSIS — R9402 Abnormal brain scan: Secondary | ICD-10-CM | POA: Diagnosis not present

## 2019-05-25 DIAGNOSIS — R6889 Other general symptoms and signs: Secondary | ICD-10-CM

## 2019-05-25 DIAGNOSIS — I6340 Cerebral infarction due to embolism of unspecified cerebral artery: Secondary | ICD-10-CM | POA: Diagnosis not present

## 2019-05-25 DIAGNOSIS — Z5181 Encounter for therapeutic drug level monitoring: Secondary | ICD-10-CM | POA: Diagnosis not present

## 2019-05-25 DIAGNOSIS — G255 Other chorea: Secondary | ICD-10-CM

## 2019-05-25 DIAGNOSIS — R0602 Shortness of breath: Secondary | ICD-10-CM

## 2019-05-25 DIAGNOSIS — R259 Unspecified abnormal involuntary movements: Secondary | ICD-10-CM | POA: Diagnosis not present

## 2019-05-25 DIAGNOSIS — E538 Deficiency of other specified B group vitamins: Secondary | ICD-10-CM | POA: Diagnosis not present

## 2019-05-25 LAB — VITAMIN B12: Vitamin B-12: 609 pg/mL (ref 211–911)

## 2019-05-25 LAB — FERRITIN: Ferritin: 182.8 ng/mL (ref 22.0–322.0)

## 2019-05-25 LAB — SEDIMENTATION RATE: Sed Rate: 8 mm/hr (ref 0–20)

## 2019-05-25 NOTE — Patient Instructions (Signed)
Your provider has requested that you have labwork completed today. Please go to [facility] ([id number]) on the second floor of this building before leaving the office today. You do not need to check in. If you are not called within 15 minutes please check with the front desk.   

## 2019-05-26 ENCOUNTER — Other Ambulatory Visit: Payer: Self-pay

## 2019-05-26 ENCOUNTER — Emergency Department (HOSPITAL_COMMUNITY): Payer: Medicare Other

## 2019-05-26 ENCOUNTER — Emergency Department (HOSPITAL_COMMUNITY)
Admission: EM | Admit: 2019-05-26 | Discharge: 2019-05-27 | Disposition: A | Discharge status: Home / Self Care | Payer: Medicare Other | Attending: Emergency Medicine | Admitting: Emergency Medicine

## 2019-05-26 ENCOUNTER — Other Ambulatory Visit: Payer: Self-pay | Admitting: Internal Medicine

## 2019-05-26 ENCOUNTER — Encounter (HOSPITAL_COMMUNITY): Payer: Self-pay | Admitting: Emergency Medicine

## 2019-05-26 DIAGNOSIS — Z951 Presence of aortocoronary bypass graft: Secondary | ICD-10-CM | POA: Insufficient documentation

## 2019-05-26 DIAGNOSIS — Z79899 Other long term (current) drug therapy: Secondary | ICD-10-CM | POA: Insufficient documentation

## 2019-05-26 DIAGNOSIS — G0481 Other encephalitis and encephalomyelitis: Secondary | ICD-10-CM

## 2019-05-26 DIAGNOSIS — R6889 Other general symptoms and signs: Secondary | ICD-10-CM

## 2019-05-26 DIAGNOSIS — I10 Essential (primary) hypertension: Secondary | ICD-10-CM | POA: Insufficient documentation

## 2019-05-26 DIAGNOSIS — J479 Bronchiectasis, uncomplicated: Secondary | ICD-10-CM

## 2019-05-26 DIAGNOSIS — I2510 Atherosclerotic heart disease of native coronary artery without angina pectoris: Secondary | ICD-10-CM | POA: Diagnosis not present

## 2019-05-26 DIAGNOSIS — D279 Benign neoplasm of unspecified ovary: Secondary | ICD-10-CM

## 2019-05-26 DIAGNOSIS — R42 Dizziness and giddiness: Secondary | ICD-10-CM | POA: Diagnosis not present

## 2019-05-26 DIAGNOSIS — R404 Transient alteration of awareness: Secondary | ICD-10-CM

## 2019-05-26 LAB — I-STAT CHEM 8, ED
BUN: 25 mg/dL — ABNORMAL HIGH (ref 8–23)
Calcium, Ion: 1.22 mmol/L (ref 1.15–1.40)
Chloride: 102 mmol/L (ref 98–111)
Creatinine, Ser: 1.1 mg/dL (ref 0.61–1.24)
Glucose, Bld: 91 mg/dL (ref 70–99)
HCT: 42 % (ref 39.0–52.0)
Hemoglobin: 14.3 g/dL (ref 13.0–17.0)
Potassium: 4.4 mmol/L (ref 3.5–5.1)
Sodium: 140 mmol/L (ref 135–145)
TCO2: 26 mmol/L (ref 22–32)

## 2019-05-26 LAB — CBC
HCT: 42.9 % (ref 39.0–52.0)
Hemoglobin: 14.9 g/dL (ref 13.0–17.0)
MCH: 33 pg (ref 26.0–34.0)
MCHC: 34.7 g/dL (ref 30.0–36.0)
MCV: 94.9 fL (ref 80.0–100.0)
Platelets: 179 10*3/uL (ref 150–400)
RBC: 4.52 MIL/uL (ref 4.22–5.81)
RDW: 12.5 % (ref 11.5–15.5)
WBC: 7.1 10*3/uL (ref 4.0–10.5)
nRBC: 0 % (ref 0.0–0.2)

## 2019-05-26 LAB — PROTIME-INR
INR: 1.1 (ref 0.8–1.2)
Prothrombin Time: 13.7 seconds (ref 11.4–15.2)

## 2019-05-26 LAB — DIFFERENTIAL
Abs Immature Granulocytes: 0.02 10*3/uL (ref 0.00–0.07)
Basophils Absolute: 0 10*3/uL (ref 0.0–0.1)
Basophils Relative: 0 %
Eosinophils Absolute: 0.2 10*3/uL (ref 0.0–0.5)
Eosinophils Relative: 3 %
Immature Granulocytes: 0 %
Lymphocytes Relative: 20 %
Lymphs Abs: 1.4 10*3/uL (ref 0.7–4.0)
Monocytes Absolute: 0.9 10*3/uL (ref 0.1–1.0)
Monocytes Relative: 12 %
Neutro Abs: 4.6 10*3/uL (ref 1.7–7.7)
Neutrophils Relative %: 65 %

## 2019-05-26 LAB — COMPREHENSIVE METABOLIC PANEL
ALT: 20 U/L (ref 0–44)
AST: 22 U/L (ref 15–41)
Albumin: 3.8 g/dL (ref 3.5–5.0)
Alkaline Phosphatase: 50 U/L (ref 38–126)
Anion gap: 13 (ref 5–15)
BUN: 22 mg/dL (ref 8–23)
CO2: 27 mmol/L (ref 22–32)
Calcium: 9.5 mg/dL (ref 8.9–10.3)
Chloride: 101 mmol/L (ref 98–111)
Creatinine, Ser: 1.12 mg/dL (ref 0.61–1.24)
GFR calc Af Amer: >60 mL/min (ref >60)
GFR calc non Af Amer: >60 mL/min (ref >60)
Glucose, Bld: 94 mg/dL (ref 70–99)
Potassium: 4.4 mmol/L (ref 3.5–5.1)
Sodium: 141 mmol/L (ref 135–145)
Total Bilirubin: 0.7 mg/dL (ref 0.3–1.2)
Total Protein: 5.9 g/dL — ABNORMAL LOW (ref 6.5–8.1)

## 2019-05-26 LAB — APTT: aPTT: 44 seconds — ABNORMAL HIGH (ref 24–36)

## 2019-05-26 IMAGING — MR MR HEAD W/O CM
12 of 13 series · 44 of 48 positions shown · non-contrast
Comparison: None.

CLINICAL DATA: Dizziness

EXAM:
MRI HEAD WITHOUT CONTRAST
TECHNIQUE: Multiplanar, multiecho pulse sequences of the brain and surrounding
structures were obtained without intravenous contrast.

[Series 5: DWI · axial · 3.0mm · 0.88mm/px · z∈[-72,+68]mm · 8 of 96 slices shown (1 of 4)]
[im 1/96]
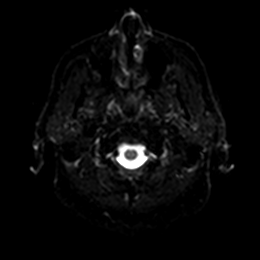
[im 14/96]
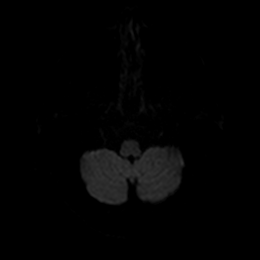
[im 28/96]
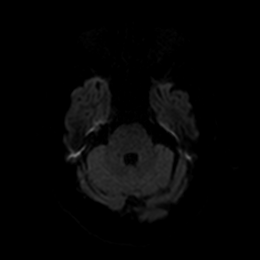
[im 41/96]
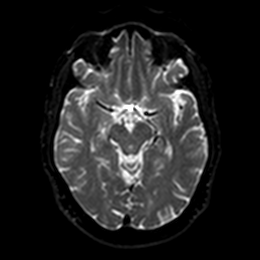
[im 55/96]
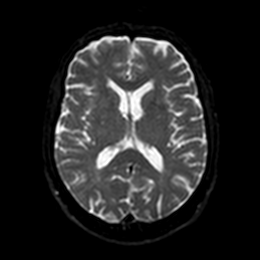
[im 68/96]
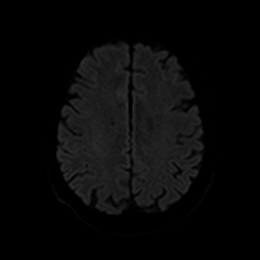
[im 82/96]
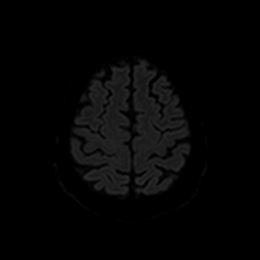
[im 96/96]
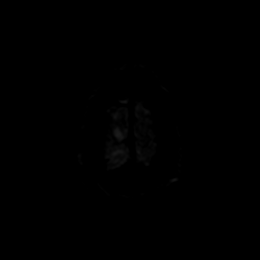

[Series 6: DWI · axial · 3.0mm · 0.88mm/px · z∈[-72,+68]mm · 4 of 48 slices shown (2 of 4)]
[im 1/48]
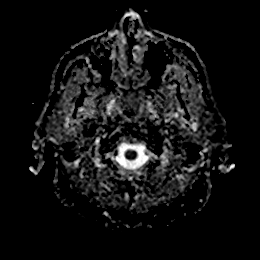
[im 16/48]
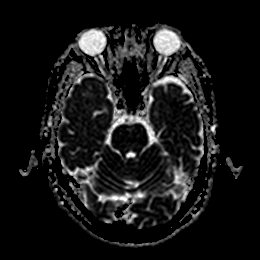
[im 32/48]
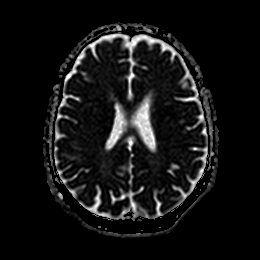
[im 48/48]
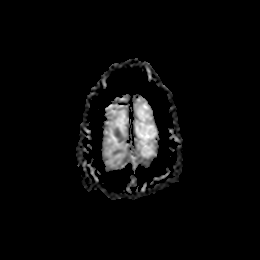

[Series 7: DWI · coronal · 4.0mm · 0.88mm/px · 5 of 72 slices shown (3 of 4)]
[im 1/72]
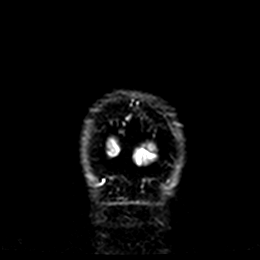
[im 18/72]
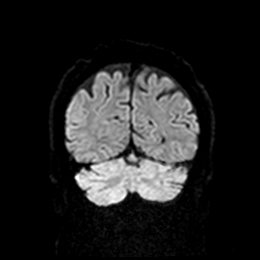
[im 36/72]
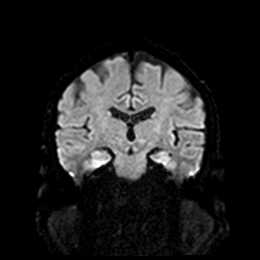
[im 54/72]
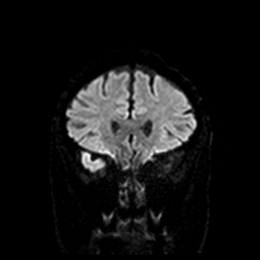
[im 72/72]
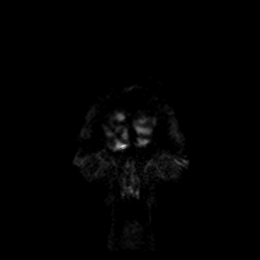

[Series 8: DWI · coronal · 4.0mm · 0.88mm/px · 3 of 36 slices shown (4 of 4)]
[im 1/36]
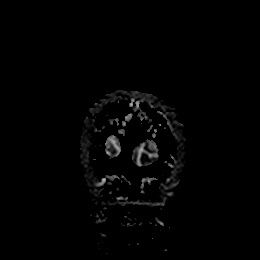
[im 18/36]
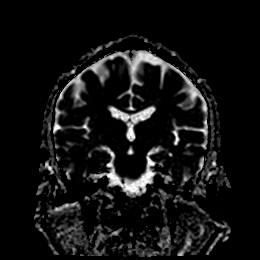
[im 36/36]
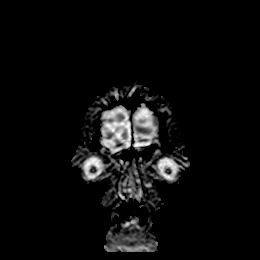

[Series 9: T1 · sagittal · 5.0mm · 0.75mm/px · 2 of 24 slices shown]
[im 1/24]
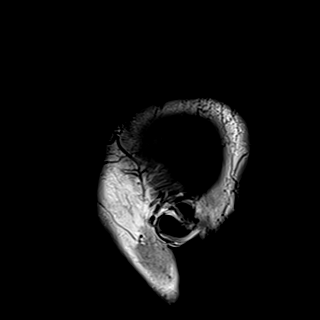
[im 24/24]
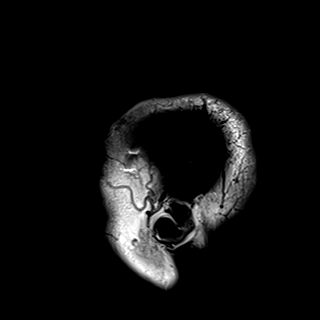

[Series 10: T2 · axial · 5.0mm · 0.72mm/px · z∈[-75,+68]mm · 2 of 25 slices shown (1 of 2)]
[im 1/25]
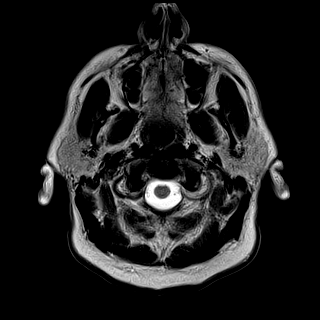
[im 25/25]
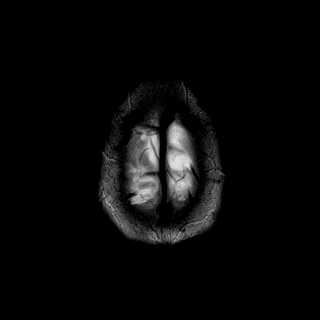

[Series 11: FLAIR · axial · 5.0mm · 0.45mm/px · z∈[-75,+68]mm · 2 of 25 slices shown]
[im 1/25]
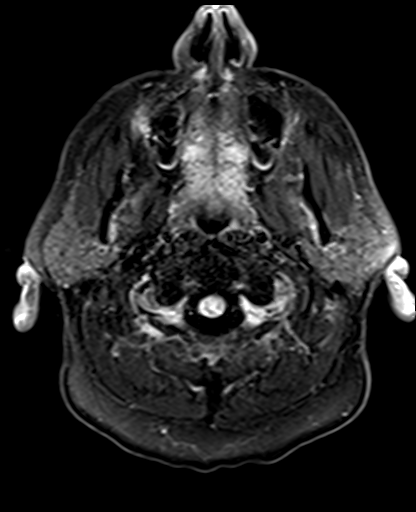
[im 25/25]
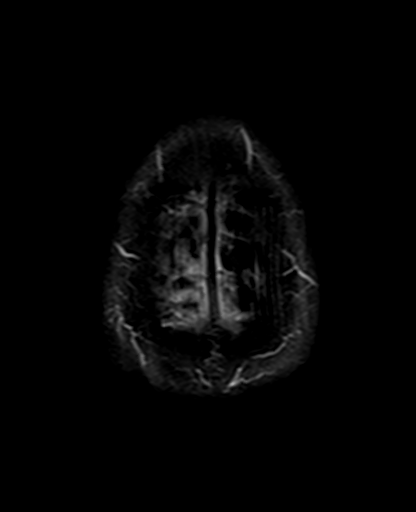

[Series 12: mag_images · axial · 3.0mm · 0.90mm/px · z∈[-84,+92]mm · 4 of 60 slices shown]
[im 1/60]
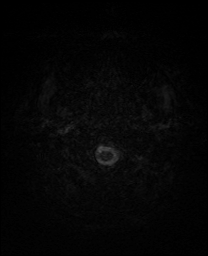
[im 20/60]
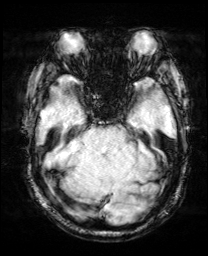
[im 40/60]
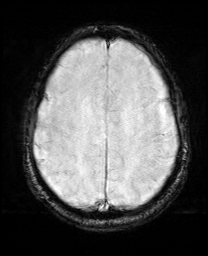
[im 60/60]
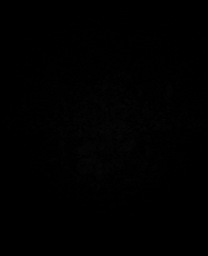

[Series 13: pha_images · axial · 3.0mm · 0.90mm/px · z∈[-84,+89]mm · 4 of 56 slices shown]
[im 1/56]
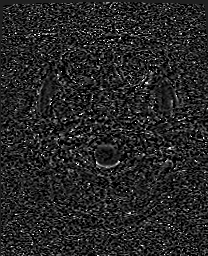
[im 19/56]
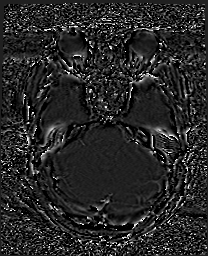
[im 37/56]
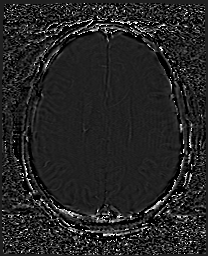
[im 56/56]
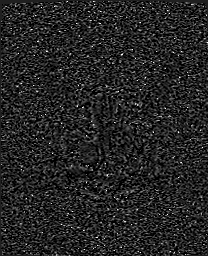

[Series 14: swi_images · axial · 3.0mm · 0.90mm/px · z∈[-84,+92]mm · 4 of 60 slices shown]
[im 1/60]
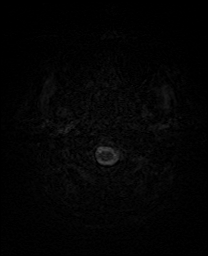
[im 20/60]
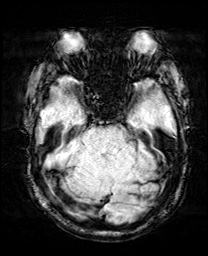
[im 40/60]
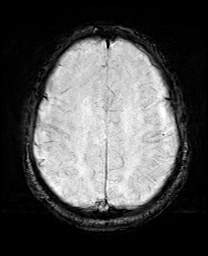
[im 60/60]
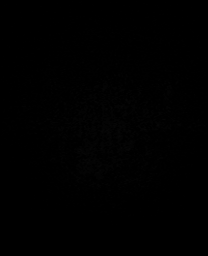

[Series 15: mip_images(sw) · axial · 24.0mm · 0.90mm/px · z∈[-74,+81]mm · 4 of 53 slices shown]
[im 1/53]
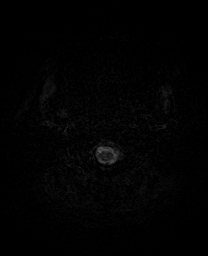
[im 18/53]
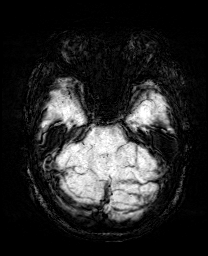
[im 35/53]
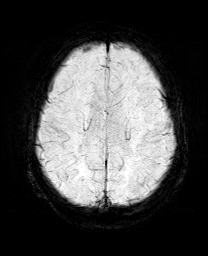
[im 53/53]
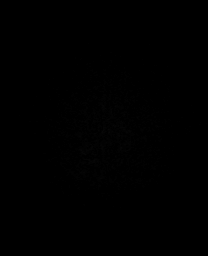

[Series 18: T2 · coronal · 5.0mm · 0.72mm/px · 2 of 28 slices shown (2 of 2)]
[im 1/28]
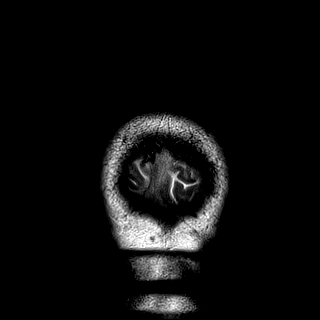
[im 28/28]
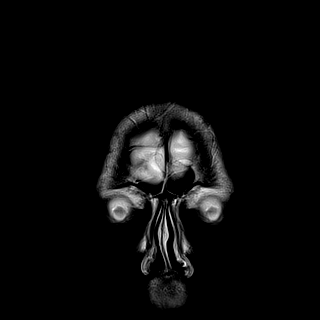

[44 of 48 positions shown; findings below may reference images not displayed]

FINDINGS: BRAIN: There is no acute infarct, acute hemorrhage or extra-axial
collection. The white matter signal is normal for the patient's age.
The cerebral and cerebellar volume are age-appropriate. There is no
hydrocephalus. The midline structures are normal. There are old left
cerebellar small vessel infarcts.

VASCULAR: The major intracranial arterial and venous sinus flow
voids are normal. Susceptibility-sensitive sequences show no chronic
microhemorrhage or superficial siderosis.

SKULL AND UPPER CERVICAL SPINE: Calvarial bone marrow signal is
normal. There is no skull base mass. The visualized upper cervical
spine and soft tissues are normal.

SINUSES/ORBITS: There are no fluid levels or advanced mucosal
thickening. The mastoid air cells and middle ear cavities are free
of fluid. The orbits are normal.
IMPRESSION: 1. No acute intracranial abnormality.
2. Old left cerebellar small vessel infarcts.

## 2019-05-26 IMAGING — CT CT HEAD W/O CM
4 series · 17 of 47 positions shown, 19 images · non-contrast
Comparison: None.

CLINICAL DATA: 78-year-old presenting with an episode of vertigo
earlier today. Patient was picking up a rock outside and became
dizzy, disoriented and unsteady when he stood up.

EXAM:
CT HEAD WITHOUT CONTRAST
TECHNIQUE: Contiguous axial images were obtained from the base of the skull
through the vertex without intravenous contrast.

[Series 3: head wo · axial · 0.43mm/px · z∈[+1074,+1214]mm · 7 of 38 slices shown, 9 images]
[im 5/38  brain]
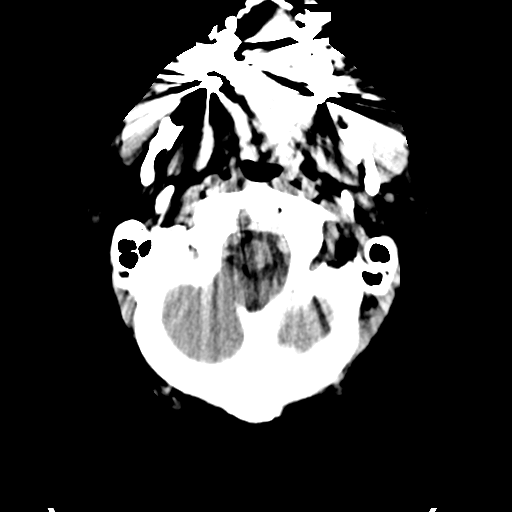
[im 5/38  bone]
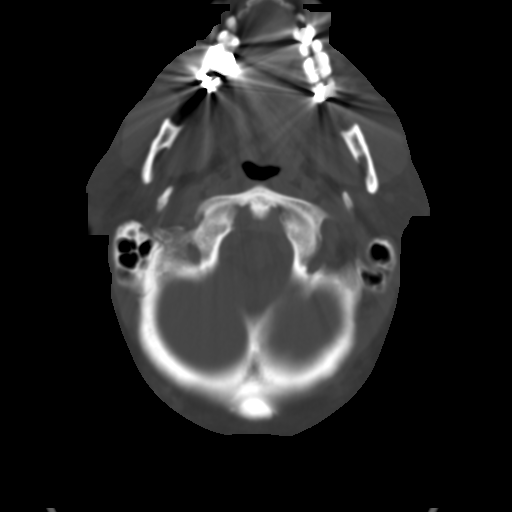
[im 10/38  brain]
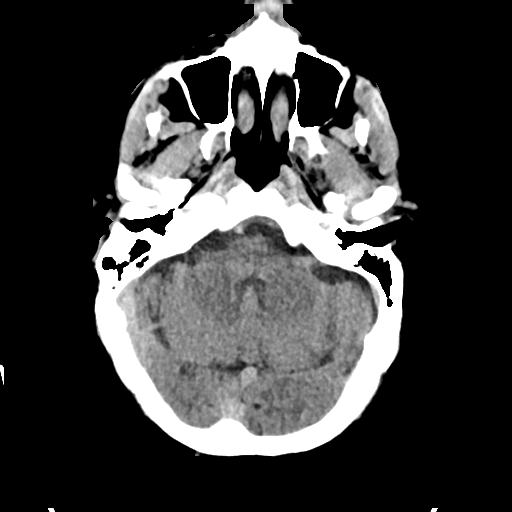
[im 14/38  brain]
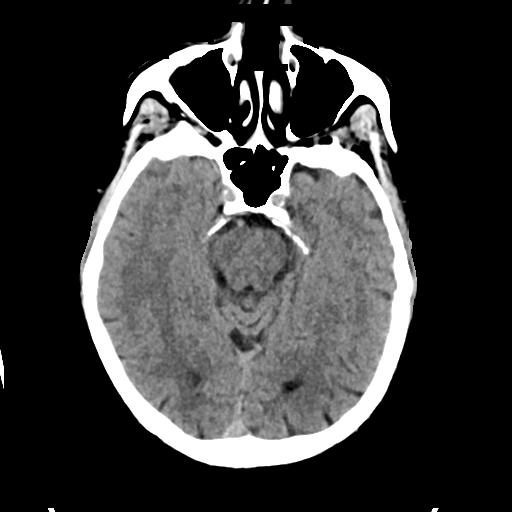
[im 19/38  brain]
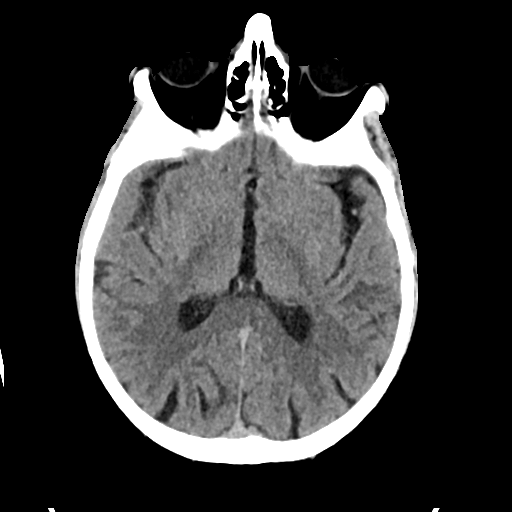
[im 24/38  brain]
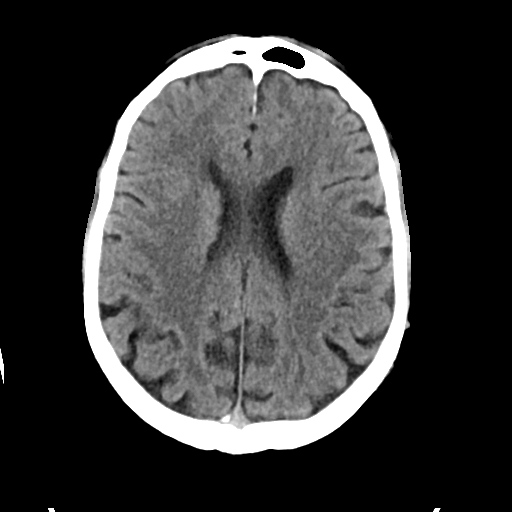
[im 24/38  bone]
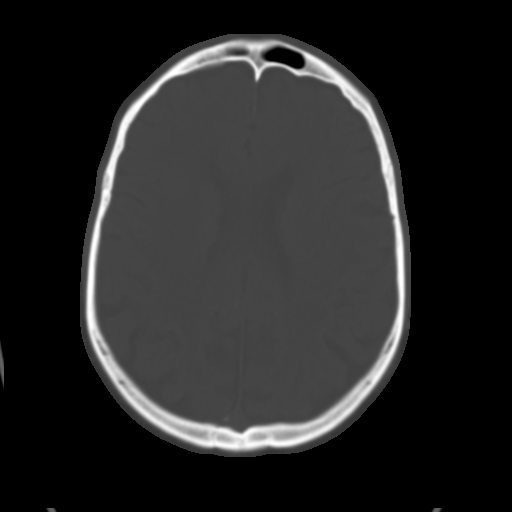
[im 28/38  brain]
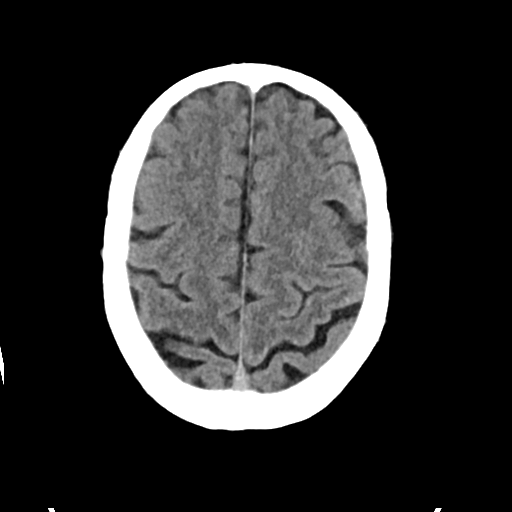
[im 33/38  brain]
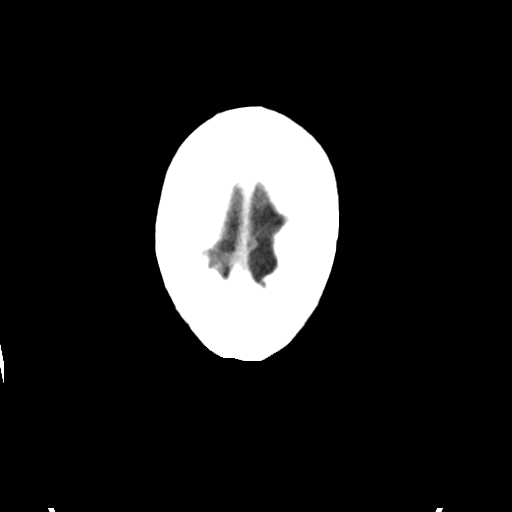

[Series 4: head bone · axial · 0.43mm/px · z∈[+1072,+1138]mm · 4 of 95 slices shown]
[im 10/95  bone]
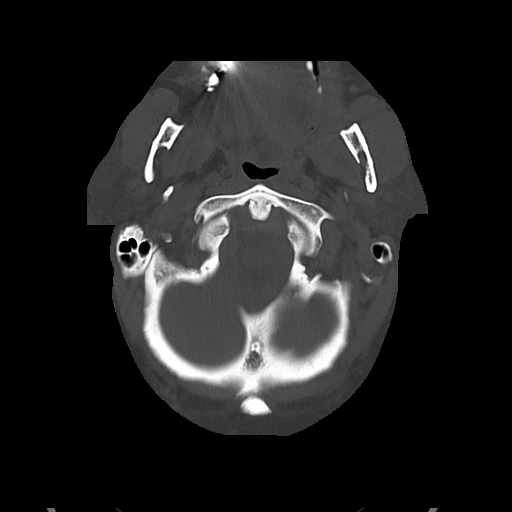
[im 19/95  bone]
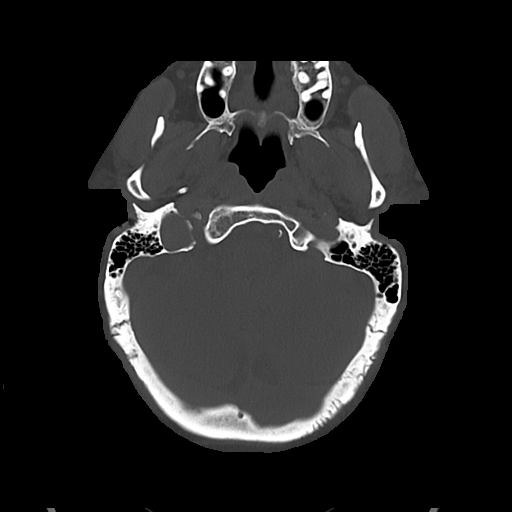
[im 29/95  bone]
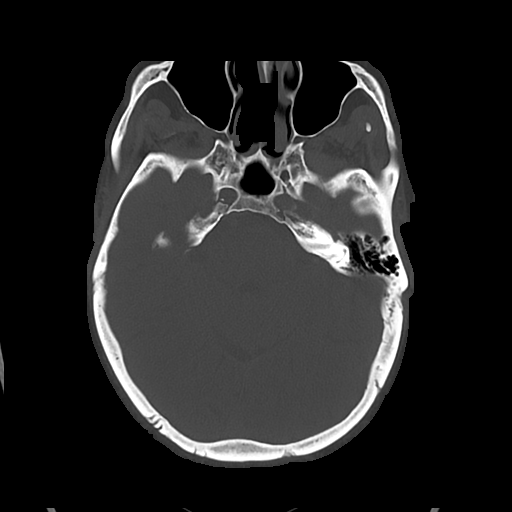
[im 43/95  bone]
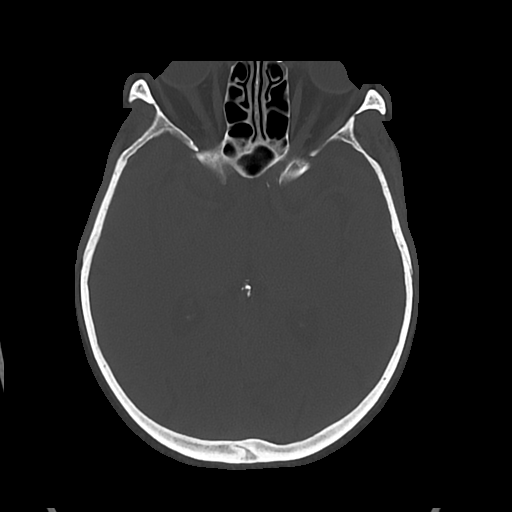

[Series 5: cor soft · coronal · 0.36mm/px · 3 of 73 slices shown]
[im 25/73  brain]
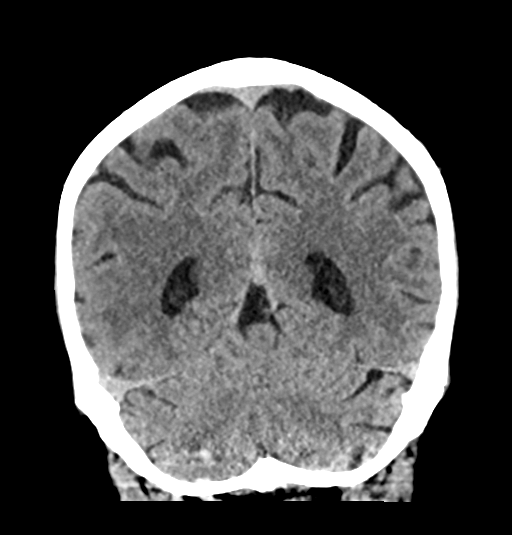
[im 33/73  brain]
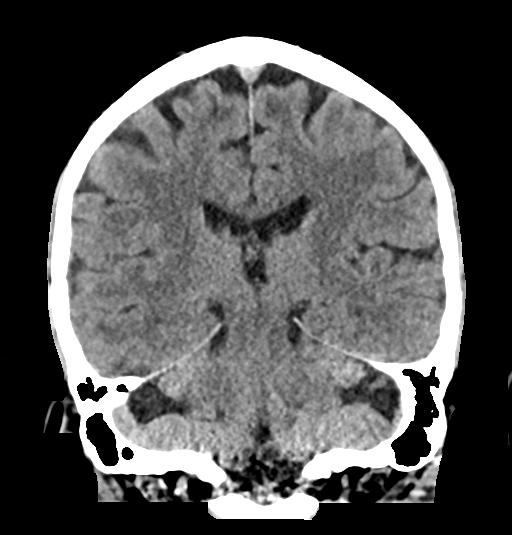
[im 41/73  brain]
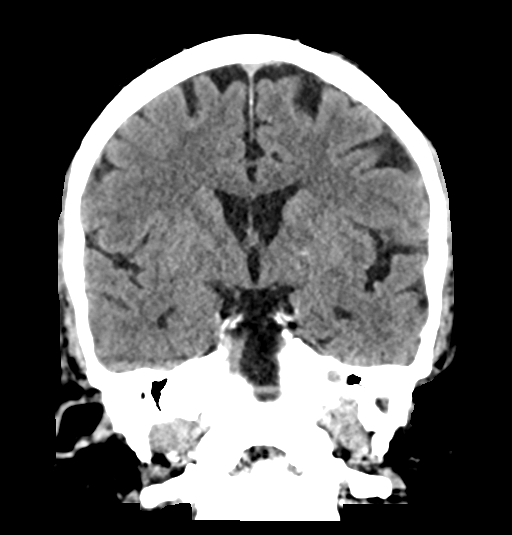

[Series 6: sag soft · sagittal · 0.39mm/px · 3 of 61 slices shown]
[im 21/61  brain]
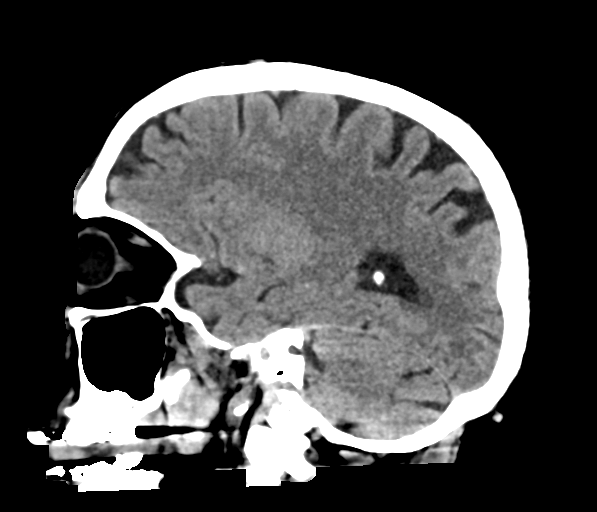
[im 31/61  brain]
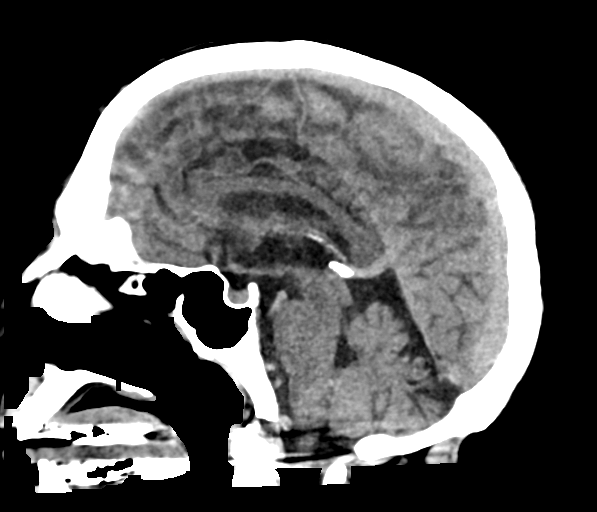
[im 41/61  brain]
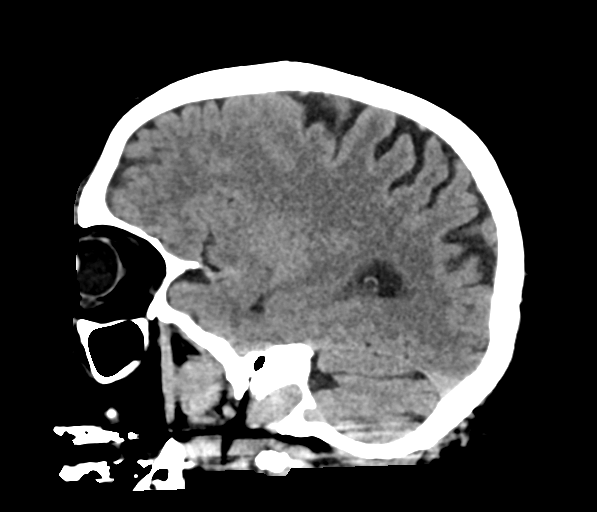

[17 of 47 positions shown; findings below may reference images not displayed]

FINDINGS: Brain: Mild age-appropriate cortical atrophy. Ventricular system
normal in size and appearance for age. No mass lesion. No midline
shift. No acute hemorrhage or hematoma. No extra-axial fluid
collections. No evidence of acute infarction.

Vascular: Mild BILATERAL carotid siphon and mild RIGHT vertebral
artery atherosclerosis. Severe LEFT vertebral artery
atherosclerosis. No hyperdense vessel.

Skull: No skull fracture or other focal osseous abnormality
involving the skull.

Sinuses/Orbits: Visualized paranasal sinuses, bilateral mastoid air
cells and bilateral middle ear cavities well-aerated. Visualized
orbits and globes normal in appearance.

Other: None.
IMPRESSION: 1. No acute intracranial abnormality.
2. Mild age-appropriate cortical atrophy.

## 2019-05-26 MED ORDER — SODIUM CHLORIDE 0.9% FLUSH: 3.0000 mL | Freq: Once | INTRAVENOUS | Status: DC

## 2019-05-26 NOTE — Discharge Instructions (Addendum)
Thank you for allowing [place] to care for you!  You have been seen today for episodes of dizziness and confusion. Please read and follow all provided instructions. Return to the emergency room for worsening condition or new concerning symptoms.    Your MRI today showed no acute intracranial abnormality. All of your blood work today was normal.   1. Medications:  No changes were made to your medications today.  Please continue home medications as prescribed.  2. Treatment: rest, drink plenty of fluids.  No driving until you are evaluated by primary care or neurology.  3. Follow Up: Please follow up with your primary doctor tomorrow as we discussed. -Also recommend you follow-up with your neurologist to discuss the episodes of confusion you had today.  ?

## 2019-05-26 NOTE — ED Provider Notes (Signed)
[facility] EMERGENCY DEPARTMENT Provider Note   CSN: [id number] Arrival date & time: 05/26/19  1556     History   Chief Complaint Chief Complaint  Patient presents with   Dizziness    HPI Jordan [name] is a 78 y.o. male with past medical history significant for degenerative disc disease, hypertension, hyperlipidemia, CABG, Mnire's disease, recent diagnosis of [place] presents to emergency department today with chief complaint of dizziness.  Patient states [place] when he picked up heavy rock and then felt off balance.  He states it resolved after a couple of minutes.  He continued to work and noticed that he kept becoming confused.  Describes the confusion as he would not be aware of where he was or what he was doing.  The episodes all lasted several minutes. Pt takes daily aspirin. Patient denies any headache, fever, chills, chest pain, neck pain, abdominal pain, nausea, vomiting, weakness, syncope.   Chart review shows patient's neurologist is Dr. Carles Collet who saw him in office yesterday.  He had 23 vials of blood drawn at that appointment.  He had recent outpatient MRI that shows multiple punctate T2 hyper intensities that are acute/subacute/positive on DWI.  1 is contrast-enhancing.  Differential at this time includes multiple new territory infarcts and metastatic disease.  Patient is currently undergoing outpatient work-up for this.   Past Medical History:  Diagnosis Date   DDD (degenerative disc disease)    Dyspnea    ED (erectile dysfunction)    GERD (gastroesophageal reflux disease)    History of hiatal hernia    History of kidney stones    passed stones - no surgery required   HTN (hypertension)    Hyperlipidemia    Impaired fasting glucose    Knee pain    bilateral   Left main coronary artery disease 03/18/2017   Cardiac Cath: 55% dLM, tandem p-mLAD 75% --> referred for CABG.   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 Cyndia Bent @ [facility]):  LIMA-LAD, SVG-D2, SVG-OM, SVG-PDA (endoscopic SVG harvesting from right leg).   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   Left main coronary artery disease 03/18/2017   Abnormal nuclear stress test 03/05/2017   Mediastinal adenopathy 02/26/2017   Essential hypertension 01/29/2017   Dyspnea on exertion 01/28/2017   Hyperlipidemia with target low density lipoprotein (LDL) cholesterol less than 70 mg/dL 03/31/2014   GERD (gastroesophageal reflux disease) 03/31/2014   ED (erectile dysfunction) 03/31/2014   Carotid stenosis, asymptomatic 07/27/2011    Past Surgical History:  Procedure Laterality Date   Cardiac Stress Test  2008   Normal   CARPAL TUNNEL RELEASE Left 1998   CARPAL TUNNEL RELEASE Right 2008   COLONOSCOPY  06/2013   Henrene Pastor - polyps   CORONARY ARTERY BYPASS GRAFT N/A 04/04/2017   Procedure: CORONARY ARTERY BYPASS GRAFTING x 4  LIMA-LAD, SVG-D2, SVG-OM, SVG-PDA (endoscopic SVG harvesting from right leg);  Surgeon: Gaye Pollack, MD;  Location: [facility];  Service: Open Heart Surgery;  Laterality: N/A;   EXTRACORPOREAL SHOCK WAVE LITHOTRIPSY Right 10/23/2018   Procedure: EXTRACORPOREAL SHOCK WAVE LITHOTRIPSY (ESWL);  Surgeon: Matilde Sprang,  Nicki Reaper, MD;  Location: [facility];  Service: Urology;  Laterality: Right;   HERNIA REPAIR Left    inguinal   HIATAL HERNIA REPAIR     no surgery per pt   KNEE CARTILAGE SURGERY Bilateral    LEFT HEART CATH AND CORONARY ANGIOGRAPHY N/A 03/18/2017   Procedure: Left Heart Cath and Coronary Angiography;  Surgeon: Leonie Man, MD;  Location: [facility];  Service: Cardiovascular:  55% dLM, p-mLAD 75% - mLAD 75%.  EF 55-65%. Mod non-obstructive RCA disease --> referred for  CABG   MEDIAL PARTIAL KNEE REPLACEMENT Right 2010   Partial right knee replacement   NM MYOVIEW LTD  01/2017   INTERMEDIATE RISK: Exercise tolerance was good at 10 minutes, however, fatigue and dyspnea were reporte-d -> hypertensive response to exercise.  108mm Horizontal ST depressions noted during stress in the II, III, aVF, V6, V5 and V4 leads c/w ischemia.   Small siize, moderate severity perfusion defect  mid to distal anterior wall perfusion defect suggestive of ischemia.Marland Kitchen    ROTATOR CUFF REPAIR Left 2008   SHOULDER ARTHROSCOPY W/ ROTATOR CUFF REPAIR Right 06/15/2016   TEE WITHOUT CARDIOVERSION N/A 04/04/2017   Procedure: TRANSESOPHAGEAL ECHOCARDIOGRAM (TEE);  Surgeon: Gaye Pollack, MD;  Location: [facility];  Service: Open Heart Surgery;  Laterality: N/A;   TONSILLECTOMY          Home Medications    Prior to Admission medications   Medication Sig Start Date End Date Taking? Authorizing Provider  Cyanocobalamin (VITAMIN B-12 PO) Take 1 tablet by mouth every evening.     [provider]  EPINEPHrine 0.3 mg/0.3 mL IJ SOAJ injection USE AS DIRECTED AS NEEDED FOR SEVERE ALLERGIC REACTION 05/06/19   [provider]  ezetimibe (ZETIA) 10 MG tablet Take 10 mg by mouth every evening.     [provider]  famotidine (PEPCID) 10 MG tablet Take 10 mg by mouth 2 (two) times daily.    [provider]  metoprolol tartrate (LOPRESSOR) 25 MG tablet Take 0.5 tablets (12.5 mg total) by mouth 2 (two) times daily. 11/06/18   Leonie Man, MD  pravastatin (PRAVACHOL) 20 MG tablet  10/18/18   [provider]  tadalafil (ADCIRCA/CIALIS) 20 MG tablet Take 20 mg by mouth daily as needed for erectile dysfunction.    [provider]  valsartan (DIOVAN) 80 MG tablet Take 80 mg by mouth daily.    [provider]    Family History Family History  Problem Relation Age of Onset   Heart failure Mother    Cancer Mother        breast    Coronary artery disease Father        CABG   Heart attack Father        Long-term smoker.   Hyperlipidemia Father    Hyperlipidemia Brother    Hypertension Brother    Hyperlipidemia Son    Healthy Daughter    Colon cancer Neg Hx    Colon polyps Neg Hx    Rectal cancer Neg Hx    Stomach cancer Neg Hx     Social History Social History   Tobacco Use   Smoking status: Never Smoker   Smokeless tobacco: Never Used  Substance Use Topics   Alcohol use: Yes    Comment: 1 drink a week (decreased it when wife quit driinking) - stopped that 2 months ago   Drug use: No     Allergies   Crestor [employer]  calcium]   Review of Systems Review of Systems  Constitutional: Negative for chills and fever.  HENT: Negative for congestion, rhinorrhea, sinus pressure and sore throat.   Eyes: Negative for pain, redness and visual disturbance.  Respiratory: Negative for cough, shortness of breath and wheezing.   Cardiovascular: Negative for chest pain and palpitations.  Gastrointestinal: Negative for abdominal pain, constipation, diarrhea, nausea and vomiting.  Genitourinary: Negative for dysuria.  Musculoskeletal: Negative for arthralgias, back pain, myalgias and neck pain.  Skin: Negative for rash and wound.  Neurological: Positive for dizziness. Negative for tremors, seizures, syncope, facial asymmetry, speech difficulty, weakness, numbness and headaches.  Psychiatric/Behavioral: Positive for confusion.     Physical Exam Updated Vital Signs BP 124/66 (BP Location: Right Arm)    Pulse 61    Temp 98.2 F (36.8 C) (Oral)    Resp 20    SpO2 94%   Physical Exam Vitals signs and nursing note reviewed.  Constitutional:      General: He is not in acute distress.    Appearance: He is not ill-appearing.  HENT:     Head: Normocephalic and atraumatic.     Right Ear: Tympanic membrane and external ear normal.     Left Ear: Tympanic membrane and external ear normal.     Nose:  Nose normal.     Mouth/Throat:     Mouth: Mucous membranes are moist.     Pharynx: Oropharynx is clear.  Eyes:     General: No scleral icterus.       Right eye: No discharge.        Left eye: No discharge.     Extraocular Movements: Extraocular movements intact.     Conjunctiva/sclera: Conjunctivae normal.     Pupils: Pupils are equal, round, and reactive to light.  Neck:     Musculoskeletal: Normal range of motion.     Vascular: No JVD.  Cardiovascular:     Rate and Rhythm: Normal rate and regular rhythm.     Pulses: Normal pulses.          Radial pulses are 2+ on the right side and 2+ on the left side.     Heart sounds: Normal heart sounds.  Pulmonary:     Comments: Lungs clear to auscultation in all fields. Symmetric chest rise. No wheezing, rales, or rhonchi. Chest:     Chest wall: No tenderness.  Abdominal:     Tenderness: There is no right CVA tenderness or left CVA tenderness.     Comments: Abdomen is soft, non-distended, and non-tender in all quadrants. No rigidity, no guarding. No peritoneal signs.  Musculoskeletal: Normal range of motion.     Right lower leg: No edema.     Left lower leg: No edema.     Comments: Left leg with involuntary tremor, unchanged per patient  Skin:    General: Skin is warm and dry.     Capillary Refill: Capillary refill takes less than 2 seconds.     Findings: No rash.  Neurological:     Mental Status: He is oriented to person, place, and time.     GCS: GCS eye subscore is 4. GCS verbal subscore is 5. GCS motor subscore is 6.     Comments: Speech is clear and goal oriented, follows commands CN III-XII intact, no facial droop Normal strength in upper and lower extremities bilaterally including dorsiflexion and plantar flexion, strong and equal grip strength Sensation normal to light and sharp touch Moves extremities without ataxia,  coordination intact Normal finger to nose and rapid alternating movements Normal gait and balance     Psychiatric:        Behavior: Behavior normal.      ED Treatments / Results  Labs (all labs ordered are listed, but only abnormal results are displayed) Labs Reviewed  APTT - Abnormal; Notable for the following components:      Result Value   aPTT 44 (*)    All other components within normal limits  COMPREHENSIVE METABOLIC PANEL - Abnormal; Notable for the following components:   Total Protein 5.9 (*)    All other components within normal limits  I-STAT CHEM 8, ED - Abnormal; Notable for the following components:   BUN 25 (*)    All other components within normal limits  PROTIME-INR  CBC  DIFFERENTIAL  CBG MONITORING, ED    EKG EKG Interpretation  Date/Time:  Tuesday May 26 2019 16:08:49 EDT Ventricular Rate:  68 PR Interval:  196 QRS Duration: 90 QT Interval:  388 QTC Calculation: 412 R Axis:   -35 Text Interpretation:  Normal sinus rhythm Left axis deviation Abnormal ECG No significant change since last tracing Confirmed by Deno Etienne [id number]) on 05/26/2019 8:24:35 PM   Radiology Ct Head Wo Contrast  Result Date: 05/26/2019 CLINICAL DATA:  78 year old presenting with an episode of vertigo earlier today. Patient was picking up a rock outside and became dizzy, disoriented and unsteady when he stood up. EXAM: CT HEAD WITHOUT CONTRAST TECHNIQUE: Contiguous axial images were obtained from the base of the skull through the vertex without intravenous contrast. COMPARISON:  None. FINDINGS: Brain: Mild age-appropriate cortical atrophy. Ventricular system normal in size and appearance for age. No mass lesion. No midline shift. No acute hemorrhage or hematoma. No extra-axial fluid collections. No evidence of acute infarction. Vascular: Mild BILATERAL carotid siphon and mild RIGHT vertebral artery atherosclerosis. Severe LEFT vertebral artery atherosclerosis. No hyperdense vessel. Skull: No skull fracture or other focal osseous abnormality involving the skull. Sinuses/Orbits:  Visualized paranasal sinuses, bilateral mastoid air cells and bilateral middle ear cavities well-aerated. Visualized orbits and globes normal in appearance. Other: None. IMPRESSION: 1. No acute intracranial abnormality. 2. Mild age-appropriate cortical atrophy. Electronically Signed   By: Evangeline Dakin M.D.   On: 05/26/2019 18:40   Mr Brain Wo Contrast  Result Date: 05/26/2019 CLINICAL DATA:  Dizziness EXAM: MRI HEAD WITHOUT CONTRAST TECHNIQUE: Multiplanar, multiecho pulse sequences of the brain and surrounding structures were obtained without intravenous contrast. COMPARISON:  None. FINDINGS: BRAIN: There is no acute infarct, acute hemorrhage or extra-axial collection. The white matter signal is normal for the patient's age. The cerebral and cerebellar volume are age-appropriate. There is no hydrocephalus. The midline structures are normal. 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 VASCULAR: The major intracranial arterial and venous sinus flow voids are normal. Susceptibility-sensitive sequences show no chronic microhemorrhage or superficial siderosis. SKULL AND UPPER CERVICAL SPINE: Calvarial bone marrow signal is normal. There is no skull base mass. The visualized upper cervical spine and soft tissues are normal. SINUSES/ORBITS: There are no fluid levels or advanced mucosal thickening. The mastoid air cells and middle ear cavities are free of fluid. The orbits are normal. IMPRESSION: 1. No acute intracranial abnormality. 2. 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 Electronically Signed   By: Ulyses Jarred M.D.   On: 05/26/2019 23:18    Procedures Procedures (including critical care time)  Medications Ordered in ED Medications  sodium chloride flush (NS) 0.9 % injection 3 mL (  has no administration in time range)     Initial Impression / Assessment and Plan / ED Course  I have reviewed the triage vital signs and the nursing notes.  Pertinent labs & imaging results that were  available during my care of the patient were reviewed by me and considered in my medical decision making (see chart for details).  Patient seen and examined. Patient nontoxic appearing, in no apparent distress, vitals WNL.  He is very well appearing. Neuro exam is without neuro exam is without focal deficits.  He is alert and oriented x4, appears to be back to his baseline.  Lungs are clear to auscultation all fields, abdomen is nontender. No lower extremity edema. Stroke work up initiated in triage. I reviewed triage labs and all are overall unremarkable. No leukocytosis, no anemia, no electrolyte derangement, LFTs normal. EKG is without ischemic changes. CT head without acute findings. Discussed pt with ED attending Dr. Tyrone Nine. He personally saw and evaluated the patient and agrees with plan for MR brain as he is having new neuro symptoms today despite his recent neuro outpatient work up. MR brain is also negative for acute findings. Pt reassessed and continues to have normal neuro exam. He has not had any of his "confusion episodes" while in the ED. The patient appears reasonably screened and/or stabilized for discharge and I doubt any other medical condition or other [facility] requiring further screening, evaluation, or treatment in the ED at this time prior to discharge. The patient is safe for discharge with strict return precautions discussed. Recommend pcp and neurology follow up.  Portions of this note were generated with [employer]. Dictation errors may occur despite best attempts at proofreading.   Final Clinical Impressions(s) / ED Diagnoses   Final diagnoses:  Transient alteration of awareness    ED Discharge Orders    None       Cherre Robins, PA-C 05/27/19 0122    Deno Etienne, DO 05/27/19 1506

## 2019-05-26 NOTE — ED Notes (Signed)
Received patient A/Ox4, no complaints at this time.

## 2019-05-26 NOTE — ED Triage Notes (Signed)
Pt reports picking up rock, when standing up he became dizzy. Pt became disoriented and unsteady. Pt believes it lasted about 15-20 min. Pt has had MRI this week due to left arm and leg moving involuntary.

## 2019-05-27 ENCOUNTER — Telehealth: Payer: Self-pay

## 2019-05-27 DIAGNOSIS — D6859 Other primary thrombophilia: Secondary | ICD-10-CM

## 2019-05-27 DIAGNOSIS — R404 Transient alteration of awareness: Secondary | ICD-10-CM

## 2019-05-27 LAB — ANTIPHOSPHOLOPID AB PANEL
Anticardiolipin IgA: <11 [APL'U] (ref <=11)
Anticardiolipin IgG: <14 [GPL'U] (ref <=14)
Anticardiolipin IgM: 102 [MPL'U] — ABNORMAL HIGH (ref <=12)
Beta-2 Glyco 1 IgA: 17 SAU (ref <=20)
Beta-2 Glyco 1 IgM: >150 SMU — ABNORMAL HIGH (ref <=20)
Beta-2 Glyco I IgG: <9 SGU (ref <=20)
PHOSPHATIDYLSERINE AB  (IGG): <10 U/mL (ref <10)
PHOSPHATIDYLSERINE AB  (IGM): >100 U/mL — ABNORMAL HIGH (ref <25)
PHOSPHATIDYLSERINE AB (IGA): <20 U/mL (ref <20)

## 2019-05-27 LAB — GLIADIN ANTIBODIES, SERUM
Gliadin IgA: 5 Units
Gliadin IgG: 2 Units

## 2019-05-27 LAB — LUPUS ANTICOAGULANT PANEL
Dilute Viper Venom Time: 106 s — ABNORMAL HIGH (ref 0.0–47.0)
PTT Lupus Anticoagulant: 68 s — ABNORMAL HIGH (ref 0.0–51.9)

## 2019-05-27 LAB — HEXAGONAL PHASE PHOSPHOLIPID: Hexagonal Phase Phospholipid: 46 s — ABNORMAL HIGH (ref 0–11)

## 2019-05-27 LAB — RPR TITER: RPR Titer: 1:1 {titer} — ABNORMAL HIGH

## 2019-05-27 LAB — PTH, INTACT AND CALCIUM
Calcium: 9.1 mg/dL (ref 8.6–10.3)
PTH: 6 pg/mL — ABNORMAL LOW (ref 14–64)

## 2019-05-27 LAB — CERULOPLASMIN: Ceruloplasmin: 26 mg/dL (ref 18–36)

## 2019-05-27 LAB — FLUORESCENT TREPONEMAL AB(FTA)-IGG-BLD: Fluorescent Treponemal ABS: NONREACTIVE

## 2019-05-27 LAB — DRVVT CONFIRM: dRVVT Confirm: 2.6 ratio — ABNORMAL HIGH (ref 0.8–1.2)

## 2019-05-27 LAB — DRVVT MIX: dRVVT Mix: 72.3 s — ABNORMAL HIGH (ref 0.0–47.0)

## 2019-05-27 LAB — RPR: RPR Ser Ql: REACTIVE — AB

## 2019-05-27 LAB — PTT-LA MIX: PTT-LA Mix: 61.4 s — ABNORMAL HIGH (ref 0.0–48.9)

## 2019-05-27 LAB — COPPER, SERUM: Copper: 80 ug/dL (ref 70–175)

## 2019-05-27 NOTE — Telephone Encounter (Signed)
Spoke with patient he was made aware of provider response and understands EEG ordered

## 2019-05-27 NOTE — Telephone Encounter (Signed)
No need for TCM as was just ER visit.  So, I don't clear to drive as the state has specific driving laws that we have to follow.  While I don't know specifics of his event (I did read ER records), [place] driving laws state that if patient has loss of consciousness or alteration in consciousness, then no driving for 6 months.  Please schedule pt an EEG at our office with dx of transient alteration of awareness.

## 2019-05-27 NOTE — Telephone Encounter (Signed)
Spoke with patient he is aware and understands Referral place Pt also given number to call if he has not heard from their office within the next 2-3 days

## 2019-05-27 NOTE — Telephone Encounter (Signed)
Pt called today he says that we was seen in E.D. yesterday because he thought he was having a stroke. MRI was complete.at [facility]. Pt was informed to follow up with PCP or Neurologist for clearance to drive again. Patient unable to drive until he has been seen from either PCP or Neurologist. Pt called PCP they have not called him back.   Patient requesting HOS F/U appt  In person? Or VV?  Do we need to do TCM prior to appt?

## 2019-05-27 NOTE — Telephone Encounter (Signed)
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  If okay with patient, refer to hematology for consult - Dr. Heath Lark.  They are in system at [place] (that is the dept)

## 2019-05-28 ENCOUNTER — Ambulatory Visit (INDEPENDENT_AMBULATORY_CARE_PROVIDER_SITE_OTHER): Payer: Medicare Other | Admitting: Cardiology

## 2019-05-28 ENCOUNTER — Encounter: Payer: Self-pay | Admitting: Cardiology

## 2019-05-28 ENCOUNTER — Other Ambulatory Visit: Payer: Self-pay

## 2019-05-28 VITALS — BP 110/64 | HR 55 | Ht 67.0 in | Wt 173.6 lb

## 2019-05-28 DIAGNOSIS — I639 Cerebral infarction, unspecified: Secondary | ICD-10-CM | POA: Diagnosis not present

## 2019-05-28 NOTE — Progress Notes (Signed)
Electrophysiology Office Note   Date:  05/28/2019   ID:  Jordan [name], DOB February 18, 1941, MRN [id number]  PCP:  Jordan Infante, MD  Cardiologist: Ellyn Hack Primary Electrophysiologist:  Jordan [name] Jordan Leeds, MD    Chief Complaint: Cryptogenic stroke   History of Present Illness: Jordan [name] is a 78 y.o. male who is being seen today for the evaluation of cryptogenic stroke at the request of Jordan Infante, MD. Presenting today for electrophysiology evaluation.  He has a history significant for hypertension, hyperlipidemia, coronary artery disease status post CABG x4 he also has a history of chorea and is followed by neurology.  He had an MRI which showed acute infarcts.  Echocardiogram shows a normal ejection fraction.  He was referred to discuss Linq monitor.  Today, he denies symptoms of palpitations, chest pain, shortness of breath, orthopnea, PND, lower extremity edema, claudication, dizziness, presyncope, syncope, bleeding, or neurologic sequela. The patient is tolerating medications without difficulties.    Past Medical History:  Diagnosis Date  . Abnormal nuclear stress test 03/05/2017  . Bibasilar crackles 02/26/2017  . Carotid stenosis, asymptomatic 07/27/2011  . DDD (degenerative disc disease)   . Dyspnea   . Dyspnea on exertion 01/28/2017  . ED (erectile dysfunction)   . Essential hypertension 01/29/2017  . GERD (gastroesophageal reflux disease)   . History of hiatal hernia   . History of kidney stones    passed stones - no surgery required  . History of wheezing 02/26/2017  . HTN (hypertension)   . Hyperlipidemia   . Hyperlipidemia with target low density lipoprotein (LDL) cholesterol less than 70 mg/dL 03/31/2014  . Impaired fasting glucose   . Knee pain    bilateral  . Left main coronary artery disease 03/18/2017   Cardiac Cath: 55% dLM, tandem p-mLAD 75% --> referred for CABG.  . Mediastinal adenopathy 02/26/2017  . Meniere's disease   . Meniere's  vertigo   . Migraines    occular - last one 07/2018  . Mild carotid artery disease ([place])   . Ocular migraine   . 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 Cyndia Bent @ [facility]):  LIMA-LAD, SVG-D2, SVG-OM, SVG-PDA (endoscopic SVG harvesting from right leg).  . Shoulder pain   . Wears hearing aid    Past Surgical History:  Procedure Laterality Date  . Cardiac Stress Test  2008   Normal  . CARPAL TUNNEL RELEASE Left 1998  . CARPAL TUNNEL RELEASE Right 2008  . COLONOSCOPY  06/2013   Henrene Pastor - polyps  . CORONARY ARTERY BYPASS GRAFT N/A 04/04/2017   Procedure: CORONARY ARTERY BYPASS GRAFTING x 4  LIMA-LAD, SVG-D2, SVG-OM, SVG-PDA (endoscopic SVG harvesting from right leg);  Surgeon: Gaye Pollack, MD;  Location: [facility];  Service: Open Heart Surgery;  Laterality: N/A;  . EXTRACORPOREAL SHOCK WAVE LITHOTRIPSY Right 10/23/2018   Procedure: EXTRACORPOREAL SHOCK WAVE LITHOTRIPSY (ESWL);  Surgeon: Bjorn Loser, MD;  Location: [facility];  Service: Urology;  Laterality: Right;  . HERNIA REPAIR Left    inguinal  . HIATAL HERNIA REPAIR     no surgery per pt  . KNEE CARTILAGE SURGERY Bilateral   . LEFT HEART CATH AND CORONARY ANGIOGRAPHY N/A 03/18/2017   Procedure: Left Heart Cath and Coronary Angiography;  Surgeon: Leonie Man, MD;  Location: [facility];  Service: Cardiovascular:  55% dLM, p-mLAD 75% - mLAD 75%.  EF 55-65%. Mod non-obstructive RCA disease --> referred for CABG  . MEDIAL PARTIAL KNEE REPLACEMENT  Right 2010   Partial right knee replacement  . NM MYOVIEW LTD  01/2017   INTERMEDIATE RISK: Exercise tolerance was good at 10 minutes, however, fatigue and dyspnea were reporte-d -> hypertensive response to exercise.  40mm Horizontal ST depressions noted during stress in the II, III, aVF, V6, V5 and V4 leads c/w ischemia.   Small siize, moderate severity perfusion defect  mid to distal anterior wall perfusion defect suggestive of ischemia..   . ROTATOR CUFF REPAIR Left 2008  . SHOULDER  ARTHROSCOPY W/ ROTATOR CUFF REPAIR Right 06/15/2016  . TEE WITHOUT CARDIOVERSION N/A 04/04/2017   Procedure: TRANSESOPHAGEAL ECHOCARDIOGRAM (TEE);  Surgeon: Gaye Pollack, MD;  Location: [facility];  Service: Open Heart Surgery;  Laterality: N/A;  . TONSILLECTOMY       Current Outpatient Medications  Medication Sig Dispense Refill  . Cyanocobalamin (VITAMIN B-12 PO) Take 1 tablet by mouth every evening.     Marland Kitchen EPINEPHrine 0.3 mg/0.3 mL IJ SOAJ injection USE AS DIRECTED AS NEEDED FOR SEVERE ALLERGIC REACTION    . ezetimibe (ZETIA) 10 MG tablet Take 10 mg by mouth every evening.     . famotidine (PEPCID) 10 MG tablet Take 10 mg by mouth 2 (two) times daily.    . pravastatin (PRAVACHOL) 20 MG tablet     . tadalafil (ADCIRCA/CIALIS) 20 MG tablet Take 20 mg by mouth daily as needed for erectile dysfunction.    . valsartan (DIOVAN) 80 MG tablet Take 80 mg by mouth daily.     No current facility-administered medications for this visit.     Allergies:   Crestor [rosuvastatin calcium]   Social History:  The patient  reports that he has never smoked. He has never used smokeless tobacco. He reports current alcohol use. He reports that he does not use drugs.   Family History:  The patient's family history includes Cancer in his mother; Coronary artery disease in his father; Healthy in his daughter; Heart attack in his father; Heart failure in his mother; Hyperlipidemia in his brother, father, and son; Hypertension in his brother.    ROS:  Please see the history of present illness.   Otherwise, review of systems is positive for none.   All other systems are reviewed and negative.    PHYSICAL EXAM: VS:  BP 110/64   Pulse (!) 55   Ht 5\' 7"  (1.702 m)   Wt 173 lb 9.6 oz (78.7 kg)   BMI 27.19 kg/m  , BMI Body mass index is 27.19 kg/m. GEN: Well nourished, well developed, in no acute distress  HEENT: normal  Neck: no JVD, carotid bruits, or masses Cardiac: RRR; no murmurs, rubs, or gallops,no edema   Respiratory:  clear to auscultation bilaterally, normal work of breathing GI: soft, nontender, nondistended, + BS MS: no deformity or atrophy  Skin: warm and dry Neuro:  Strength and sensation are intact Psych: euthymic mood, full affect  EKG:  EKG is not ordered today. Personal review of the ekg ordered 02/07/2019 shows sinus rhythm, rate 68, left axis deviation  Recent Labs: 05/26/2019: ALT 20; BUN 25; Creatinine, Ser 1.10; Hemoglobin 14.3; Platelets 179; Potassium 4.4; Sodium 140    Lipid Panel  No results found for: CHOL, TRIG, HDL, CHOLHDL, VLDL, LDLCALC, LDLDIRECT   Wt Readings from Last 3 Encounters:  05/28/19 173 lb 9.6 oz (78.7 kg)  05/25/19 175 lb (79.4 kg)  10/23/18 181 lb (82.1 kg)      Other studies Reviewed: Additional studies/ records that were reviewed  today include: TTE 05/21/19  Review of the above records today demonstrates:   1. Left ventricular ejection fraction, by visual estimation, is 60 to 65%. The left ventricle has normal function. Normal left ventricular size. There is no left ventricular hypertrophy.  2. Left ventricular diastolic Doppler parameters are indeterminate pattern of LV diastolic filling.  3. Global right ventricle has normal systolic function.The right ventricular size is mildly enlarged. No increase in right ventricular wall thickness.  4. Left atrial size was normal.  5. Right atrial size was normal.  6. The mitral valve is normal in structure. Trace mitral valve regurgitation. No evidence of mitral stenosis.  7. The tricuspid valve is normal in structure. Tricuspid valve regurgitation is trivial.  8. The aortic valve is normal in structure. Aortic valve regurgitation was not visualized by color flow Doppler. Mild to moderate aortic valve sclerosis/calcification without any evidence of aortic stenosis.  9. The pulmonic valve was normal in structure. Pulmonic valve regurgitation is not visualized by color flow Doppler. 10. Normal pulmonary  artery systolic pressure. 11. The inferior vena cava is normal in size with greater than 50% respiratory variability, suggesting right atrial pressure of 3 mmHg.   ASSESSMENT AND PLAN:  1.  Cryptogenic stroke: No obvious atrial fibrillation noted.  He Namir Neto need a TEE as well as Linq monitor implant.  Risks and benefits of both were discussed.  Risks of TEE including bleeding, damage to oral and throat structures.  Risks of length include bleeding and infection.  The patient understands these risks and is agreed to the procedure.  Follow-up scheduled with hematology.  Would like to see what they determine based on anticoagulation prior to Linq monitor implant.  2.  Coronary artery disease: Status post CABG.  No current chest pain.  3.  Hypertension: Blood pressure well controlled.  4.  Hyperlipidemia: Continue Zetia per primary cardiology    Current medicines are reviewed at length with the patient today.   The patient does not have concerns regarding his medicines.  The following changes were made today:  none  Labs/ tests ordered today include:  No orders of the defined types were placed in this encounter.    Disposition:   FU with Quina Wilbourne pending link results  Signed, Faizan Geraci Jordan Leeds, MD  05/28/2019 12:24 PM     [place] [address] [place] [address] [phone] (office) [phone] (fax)

## 2019-05-28 NOTE — Patient Instructions (Signed)
Medication Instructions:  Your physician recommends that you continue on your current medications as directed. Please refer to the Current Medication list given to you today.  * If you need a refill on your cardiac medications before your next appointment, please call your pharmacy.   Labwork: None ordered  Testing/Procedures: Your physician has recommended that you have a loop recorder implanted, depending on what hematologist says.   Follow-Up: To be determined    Thank you for choosing [facility]!!   Trinidad Curet, RN [phone]  Any Other Special Instructions Will Be Listed Below (If Applicable). Call the office once you have spoken with Hematology    LOOP RECORDER IMPLANT INSTRUCTIONS             2. Wash your chest and neck with surgical scrub the evening before and the morning of your procedure.  Rinse well. Please review the surgical scrub  Instructions: [place] - Preparing For Surgery Before surgery, you can play an important role. Because skin is not sterile, your skin needs to be as free of germs as possible. You can reduce the number of germs on your skin by washing with CHG (chlorahexidine gluconate) Soap before surgery.  CHG is an antiseptic cleaner which kills germs and bonds with the skin to continue killing germs even after washing.   Please do not use if you have an allergy to CHG or antibacterial soaps.  If your skin becomes reddened/irritated stop using the CHG.   Do not shave (including legs and underarms) for at least 48 hours prior to first CHG shower.  It is OK to shave your face.  Please follow these instructions carefully:             1.  Shower the night before surgery and the morning of surgery with CHG.             2.  If you choose to wash your hair, wash your hair first as usual with your normal shampoo.             3.  After you shampoo, rinse your hair and body thoroughly to remove the shampoo.             4.  Use CHG as  you would any other liquid soap.  You can apply CHG directly to the skin and wash gently with a clean washcloth. 5.  Apply the CHG Soap to your body ONLY FROM THE NECK DOWN.  Do not use on open wounds or open sores.  Avoid contact with your eyes, ears, mouth and genitals (private parts).  Wash genitals (private parts) with your normal soap.             6.  Wash thoroughly, paying special attention to the area where your surgery will be performed.             7.  Thoroughly rinse your body with warm water from the neck down.              8.  DO NOT shower/wash with your normal soap after using and rinsing off the CHG soap.             9.  Pat yourself dry with a clean towel.           10.  Wear clean pajamas.           11.  Place clean sheets on your bed the night of your first shower and do  not sleep with pets.  Day of Surgery: Do not apply any deodorants/lotions.  Please wear clean clothes to the hospital/surgery center.   Implantable Loop Recorder Placement An implantable loop recorder is a small electronic device that is placed under the skin of your chest. It is about the size of an AA ("double A") battery. The device records the electrical activity of your heart over a long period of time. Your health care provider can download these recordings to monitor your heart. You may need an implantable loop recorder if you have periods of abnormal heart activity (arrhythmias) or unexplained fainting (syncope) caused by a heart problem. Tell a health care provider about:  Any allergies you have.  All medicines you are taking, including vitamins, herbs, eye drops, creams, and over-the-counter medicines.  Any problems you or family members have had with anesthetic medicines.  Any blood disorders you have.  Any surgeries you have had.  Any medical conditions you have.  Whether you are pregnant or may be pregnant. What are the risks? Generally, this is a safe procedure. However, as with  any procedure, problems may occur, including:  Infection.  Bleeding.  Allergic reactions to anesthetic medicines.  Damage to nerves or blood vessels.  Failure of the device to work. This could require another surgery to replace it.  What happens before the procedure?    You may have a physical exam, blood tests, and imaging tests of your heart, such as a chest X-ray.  Follow instructions from your health care provider about eating or drinking restrictions.  Ask your health care provider about: ? Changing or stopping your regular medicines. This is especially important if you are taking diabetes medicines or blood thinners. ? Taking medicines such as aspirin and ibuprofen. These medicines can thin your blood. Do not take these medicines before your procedure if your surgeon instructs you not to.  Ask your health care provider how your surgical site will be marked or identified.  You may be given antibiotic medicine to help prevent infection.  Plan to have someone take you home after the procedure.  If you will be going home right after the procedure, plan to have someone with you for 24 hours.  Do not use any tobacco products, such as cigarettes, chewing tobacco, and e-cigarettes as told by your surgeon. If you need help quitting, ask your health care provider. What happens during the procedure?  To reduce your risk of infection: ? Your health care team will wash or sanitize their hands. ? Your skin will be washed with soap.  An IV tube will be inserted into one of your veins.  You may be given an antibiotic medicine through the IV tube.  You may be given one or more of the following: ? A medicine to help you relax (sedative). ? A medicine to numb the area (local anesthetic).  A small cut (incision) will be made on the left side of your upper chest.  A pocket will be created under your skin.  The device will be placed in the pocket.  The incision will be closed  with stitches (sutures) or adhesive strips.  A bandage (dressing) will be placed over the incision. The procedure may vary among health care providers and hospitals. What happens after the procedure?  Your blood pressure, heart rate, breathing rate, and blood oxygen level will be monitored often until the medicines you were given have worn off.  You may be able to go home on the day  of your surgery. Before going home: ? Your health care provider will program your recorder. ? You will learn how to trigger your device with a handheld activator. ? You will learn how to send recordings to your health care provider. ? You will get an ID card for your device, and you will be told when to use it.  Do not drive for 24 hours if you received a sedative. This information is not intended to replace advice given to you by your health care provider. Make sure you discuss any questions you have with your health care provider. Document Released: 07/18/2015 Document Revised: 01/12/2016 Document Reviewed: 05/11/2015 Elsevier Interactive Patient Education  [employer].

## 2019-05-28 NOTE — Addendum Note (Signed)
Addended by: Ranae Plumber on: 05/28/2019 10:02 AM   Modules accepted: Orders

## 2019-05-29 ENCOUNTER — Telehealth: Payer: Self-pay | Admitting: Neurology

## 2019-05-29 ENCOUNTER — Telehealth: Payer: Self-pay | Admitting: Oncology

## 2019-05-29 NOTE — Telephone Encounter (Signed)
sure

## 2019-05-29 NOTE — Telephone Encounter (Signed)
Patient called to let Dr. Carles Collet know that he has an appointment with a Hematologist on 06/19/19 Dr. Alen Blew. Thanks

## 2019-05-29 NOTE — Telephone Encounter (Signed)
Received a new hem referral from Dr. Carles Collet at [facility] for hypercoagulable state. Mr. Jordan [name] has been cld and scheduled to see Dr. Alen Blew on 10/30 at 10am. He's been made aware to arrive 15 minutes early.

## 2019-05-29 NOTE — Telephone Encounter (Signed)
Spoke with patient he said this was the soonest appt available  Are you ok with him seeing Dr. Alen Blew

## 2019-06-01 ENCOUNTER — Other Ambulatory Visit: Payer: Self-pay

## 2019-06-01 ENCOUNTER — Telehealth: Payer: Self-pay | Admitting: Neurology

## 2019-06-01 ENCOUNTER — Other Ambulatory Visit: Payer: Medicare Other

## 2019-06-01 ENCOUNTER — Ambulatory Visit (INDEPENDENT_AMBULATORY_CARE_PROVIDER_SITE_OTHER): Payer: Medicare Other | Admitting: Neurology

## 2019-06-01 DIAGNOSIS — R404 Transient alteration of awareness: Secondary | ICD-10-CM | POA: Diagnosis not present

## 2019-06-01 NOTE — Telephone Encounter (Signed)
Let pt know that his EEG was normal

## 2019-06-01 NOTE — Telephone Encounter (Signed)
Spoke with patient he was informed of results.

## 2019-06-01 NOTE — Procedures (Signed)
TECHNICAL SUMMARY:  A multichannel referential and bipolar montage EEG using the standard international 10-20 system was performed on the patient described as awake.  The dominant background activity consists of 9 hertz activity seen most prominantly over the posterior head region.  The backgound activity is reactive to eye opening and closing procedures.  Low voltage fast (beta) activity is distributed symmetrically and maximally over the anterior head regions.  ACTIVATION:  Stepwise photic stimulation at 4-20 flashes per second was performed and did not elicit any abnormal waveforms.  Hyperventilation was not performed due to COVID safety  EPILEPTIFORM ACTIVITY:  There were no spikes, sharp waves or paroxysmal activity.  SLEEP: No sleep is noted   IMPRESSION:  This is a normal EEG for the patients stated age.  There were no focal, hemispheric or lateralizing features.  No epileptiform activity was recorded.  A normal EEG does not exclude the diagnosis of a seizure disorder and if seizure remains high on the list of differential diagnosis, an ambulatory EEG may be of value.  Clinical correlation is required.

## 2019-06-03 ENCOUNTER — Encounter: Payer: Self-pay | Admitting: Neurology

## 2019-06-03 DIAGNOSIS — M79641 Pain in right hand: Secondary | ICD-10-CM | POA: Diagnosis not present

## 2019-06-03 DIAGNOSIS — G255 Other chorea: Secondary | ICD-10-CM | POA: Diagnosis not present

## 2019-06-03 DIAGNOSIS — M65341 Trigger finger, right ring finger: Secondary | ICD-10-CM | POA: Diagnosis not present

## 2019-06-04 ENCOUNTER — Other Ambulatory Visit: Payer: Self-pay | Admitting: Internal Medicine

## 2019-06-04 ENCOUNTER — Ambulatory Visit
Admission: RE | Admit: 2019-06-04 | Discharge: 2019-06-04 | Disposition: A | Discharge status: Home / Self Care | Payer: Medicare Other | Source: Ambulatory Visit | Attending: Internal Medicine | Admitting: Internal Medicine

## 2019-06-04 ENCOUNTER — Other Ambulatory Visit: Payer: Self-pay

## 2019-06-04 DIAGNOSIS — D279 Benign neoplasm of unspecified ovary: Secondary | ICD-10-CM

## 2019-06-04 DIAGNOSIS — J479 Bronchiectasis, uncomplicated: Secondary | ICD-10-CM

## 2019-06-04 DIAGNOSIS — R6889 Other general symptoms and signs: Secondary | ICD-10-CM

## 2019-06-04 DIAGNOSIS — J9811 Atelectasis: Secondary | ICD-10-CM | POA: Diagnosis not present

## 2019-06-04 DIAGNOSIS — K7689 Other specified diseases of liver: Secondary | ICD-10-CM | POA: Diagnosis not present

## 2019-06-04 IMAGING — CT CT CHEST W/ CM
1 series · 11 of 32 positions shown, 13 images · IV contrast (APPLIED)
Comparison: Brain MRI [DATE]

CLINICAL DATA: Small indeterminate lesions in the brain on MRI.
Metastatic disease in the differential.

EXAM:
CT CHEST, ABDOMEN, AND PELVIS WITH CONTRAST
TECHNIQUE: Multidetector CT imaging of the chest, abdomen and pelvis was
performed following the standard protocol during bolus
administration of intravenous contrast.
CONTRAST:  100mL [TF] IOPAMIDOL ([TF]) INJECTION 61%

[Series 4: cor · coronal · 0.93mm/px · 11 of 152 slices shown, 13 images]
[im 5/152  lung]
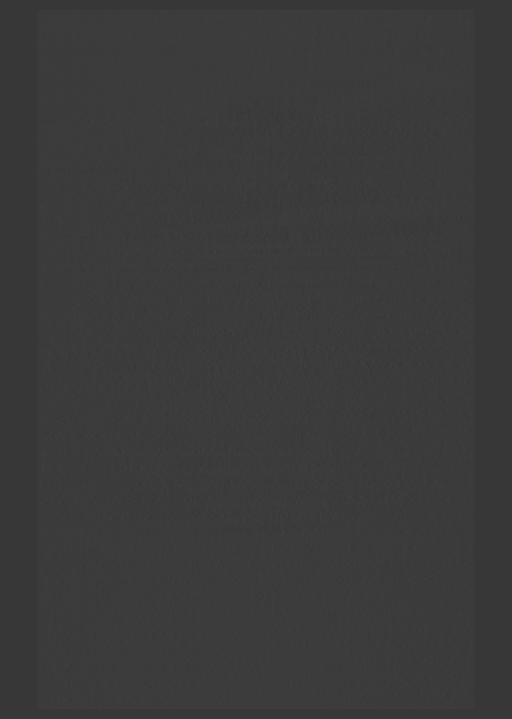
[im 10/152  soft-tissue]
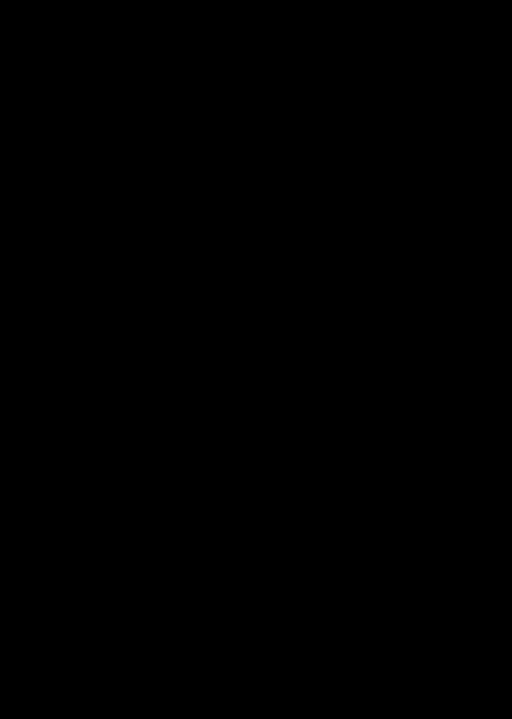
[im 10/152  lung]
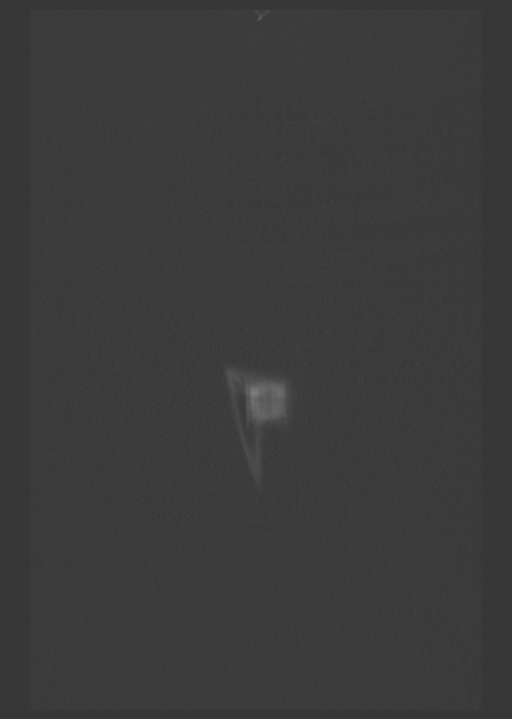
[im 10/152  bone]
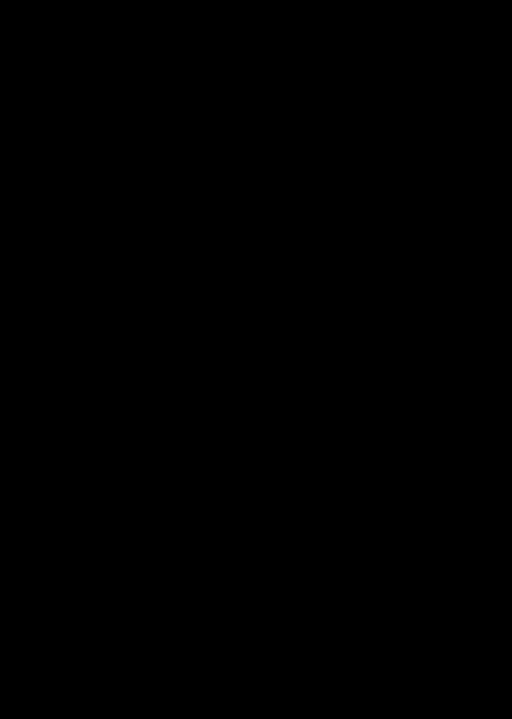
[im 15/152  lung]
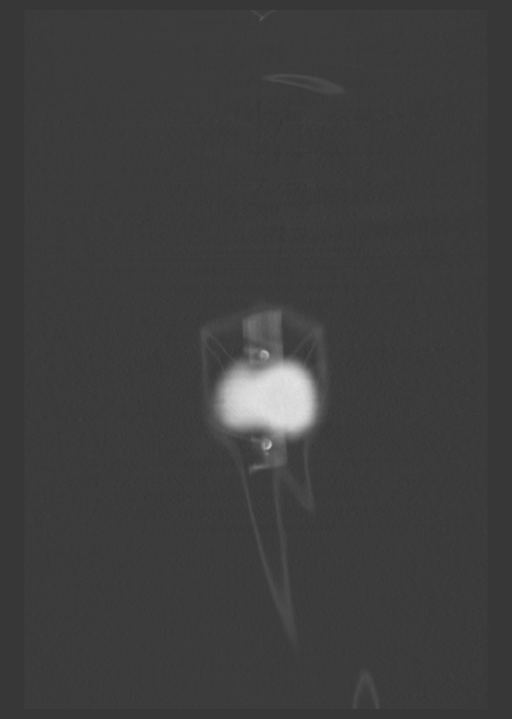
[im 20/152  lung]
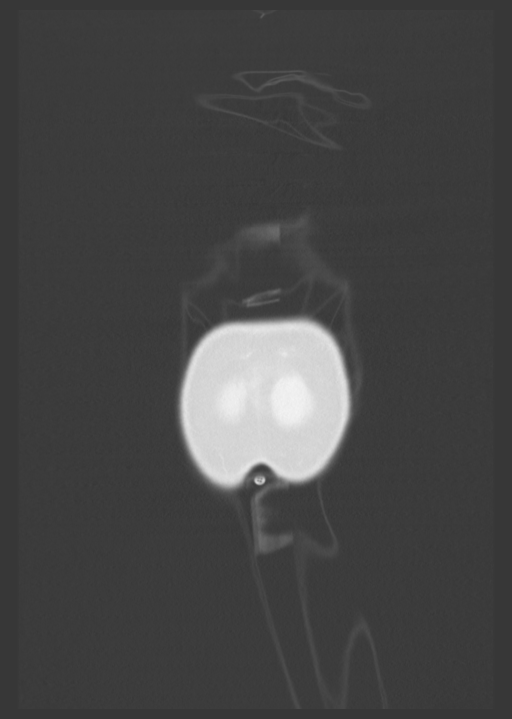
[im 30/152  soft-tissue]
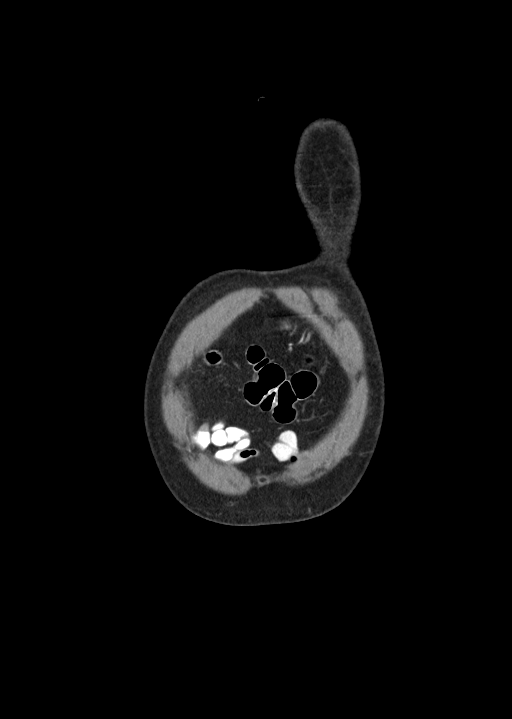
[im 49/152  soft-tissue]
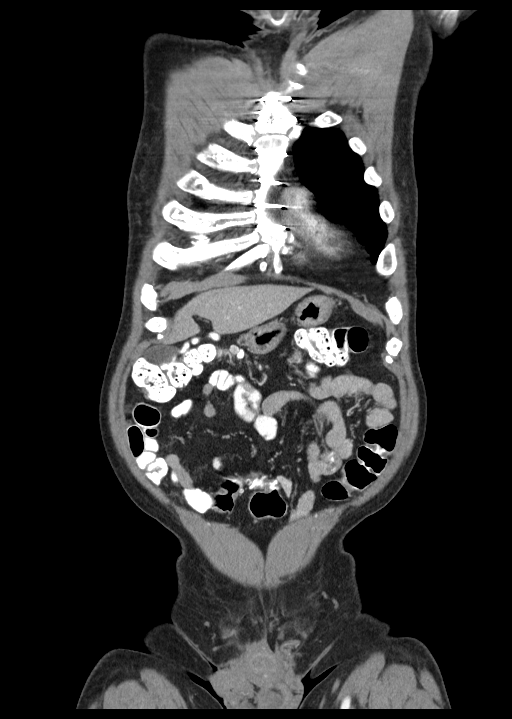
[im 69/152  soft-tissue]
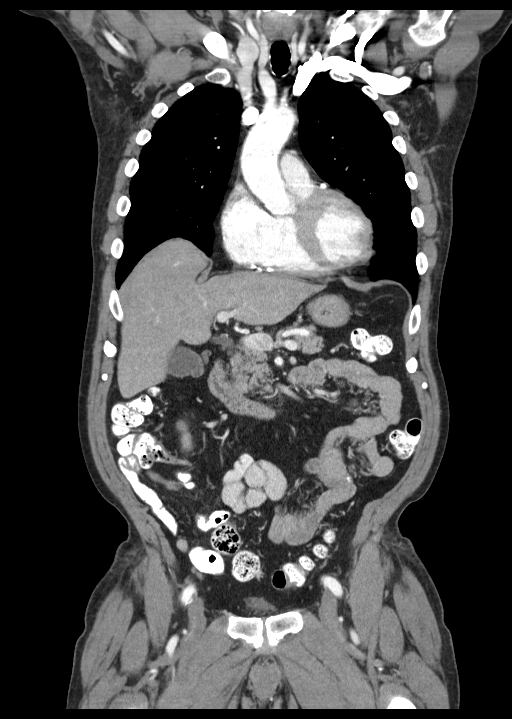
[im 83/152  soft-tissue]
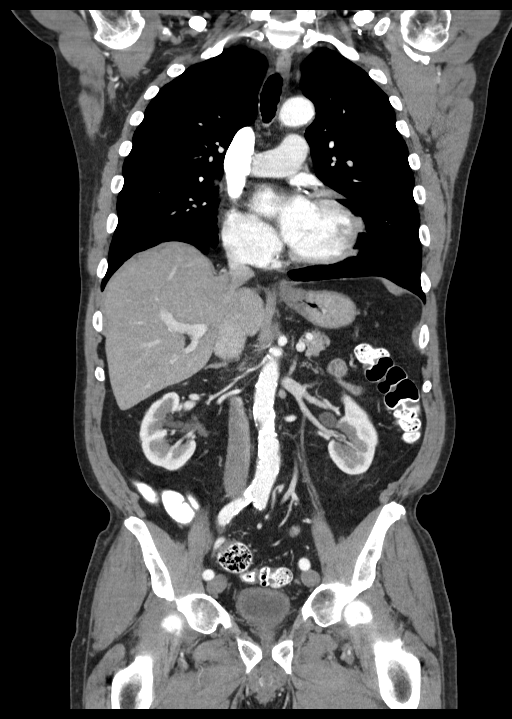
[im 103/152  soft-tissue]
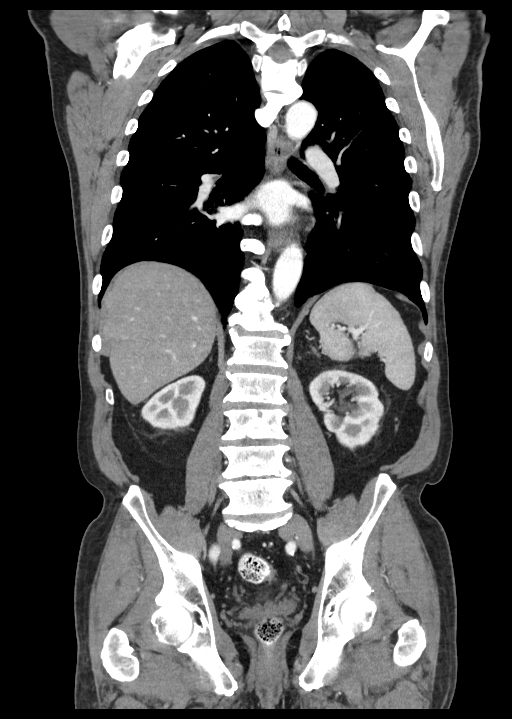
[im 122/152  soft-tissue]
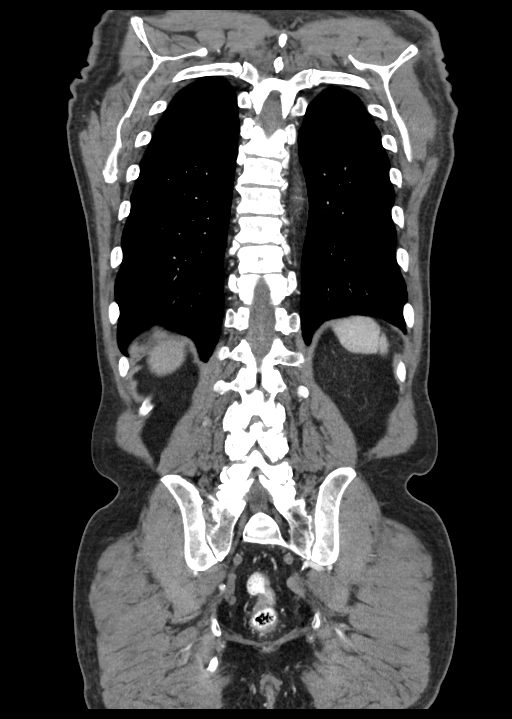
[im 142/152  soft-tissue]
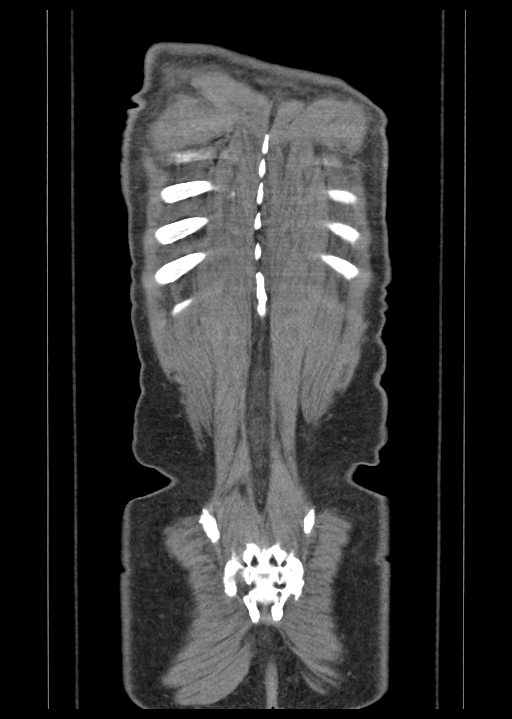

[11 of 32 positions shown; findings below may reference images not displayed]

FINDINGS: CT CHEST FINDINGS

Cardiovascular: Coronary artery calcification and aortic
atherosclerotic calcification. Post CABG.

Mediastinum/Nodes: No axillary supraclavicular adenopathy. No
mediastinal hilar adenopathy. No pericardial effusion. Esophagus
normal.

Lungs/Pleura: No suspicious nodularity. Linear atelectasis lung
bases. Airways normal.

Musculoskeletal: No aggressive osseous lesion.

CT ABDOMEN AND PELVIS FINDINGS

Hepatobiliary: The several small hypodense subcentimeter lesions are
noted in the liver. Example lesion 8 mm in subcapsular RIGHT hepatic
lobe on image 58/2. Lesion LEFT hepatic lobe measuring only 3 mm
image 52/2. The these lesions are noted on comparison noncontrast CT
from [DATE] and appear unchanged.

Pancreas: Pancreas is normal. No ductal dilatation. No pancreatic
inflammation.

Spleen: Normal spleen

Adrenals/urinary tract: Adrenal glands and kidneys are normal. The
ureters and bladder normal.

Stomach/Bowel: Stomach, small bowel, appendix, and cecum are normal.
The colon and rectosigmoid colon are normal.

Vascular/Lymphatic: Abdominal aorta is normal caliber with
atherosclerotic calcification. There is no retroperitoneal or
periportal lymphadenopathy. No pelvic lymphadenopathy.

Reproductive: Prostate normal.

Other: No peritoneal disease

Musculoskeletal: No aggressive osseous lesion.
IMPRESSION: Chest Impression:

1. No evidence of thoracic metastasis.
2. Post CABG.

Abdomen / Pelvis Impression:

1. No evidence of abdominopelvic metastasis or primary malignancy.
2. Several small lesions in liver are too small to characterize but
appears stable from comparison exams favored benign.
3.  Atherosclerotic calcification of the aorta.
4. Degenerate changes of the spine

## 2019-06-04 IMAGING — CT CT CHEST W/ CM
3 series · 14 of 32 positions shown, 18 images · IV contrast (APPLIED)
Comparison: Brain MRI [DATE]

CLINICAL DATA: Small indeterminate lesions in the brain on MRI.
Metastatic disease in the differential.

EXAM:
CT CHEST, ABDOMEN, AND PELVIS WITH CONTRAST
TECHNIQUE: Multidetector CT imaging of the chest, abdomen and pelvis was
performed following the standard protocol during bolus
administration of intravenous contrast.
CONTRAST:  100mL [TF] IOPAMIDOL ([TF]) INJECTION 61%

[Series 2: chest/abd/pelvis w/cm · axial · 0.76mm/px · z∈[+545,+1090]mm · 8 of 131 slices shown]
[im 11/131  soft-tissue]
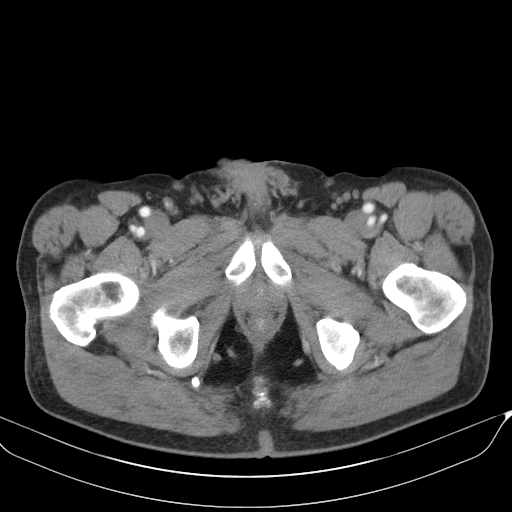
[im 33/131  soft-tissue]
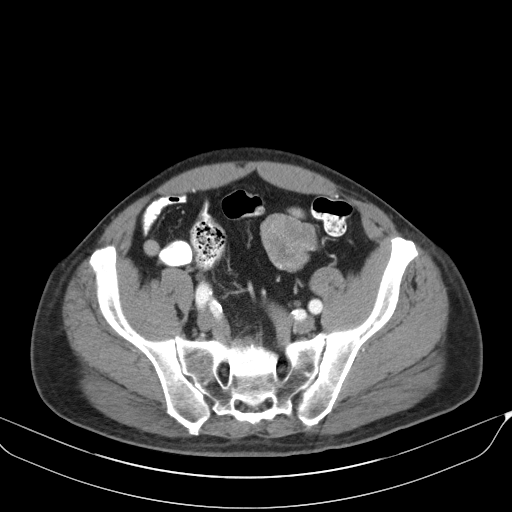
[im 44/131  soft-tissue]
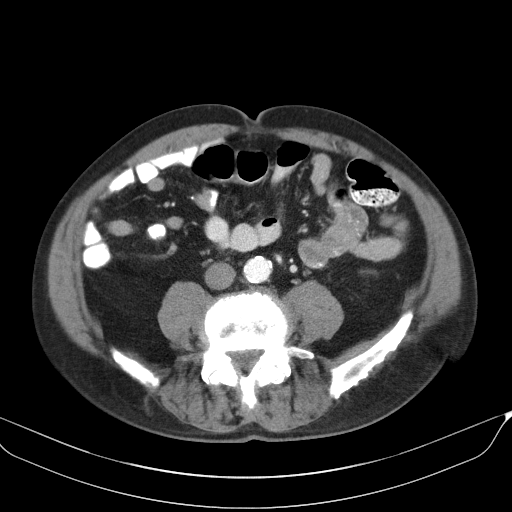
[im 55/131  soft-tissue]
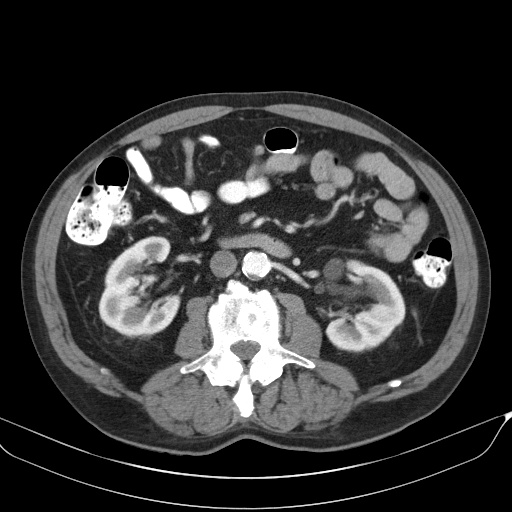
[im 76/131  soft-tissue]
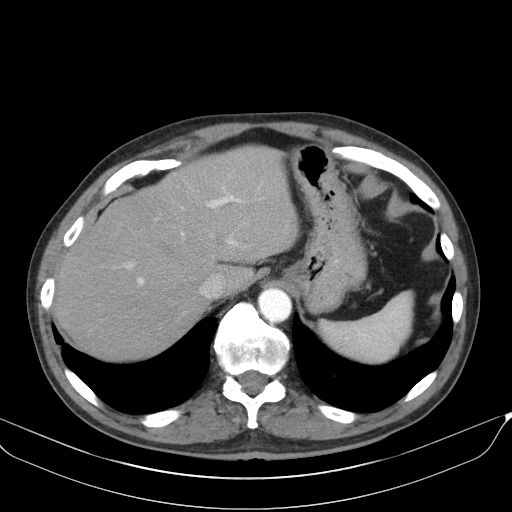
[im 87/131  soft-tissue]
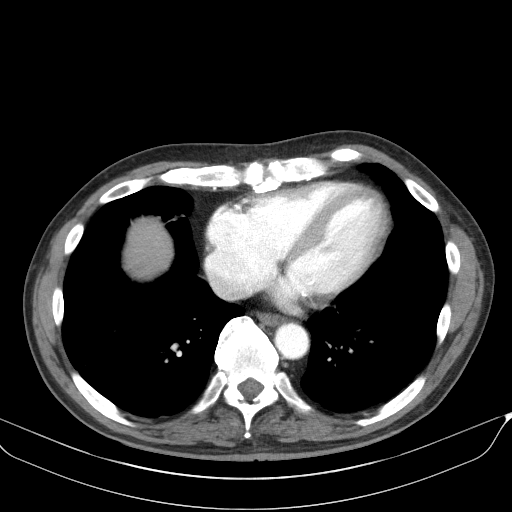
[im 98/131  soft-tissue]
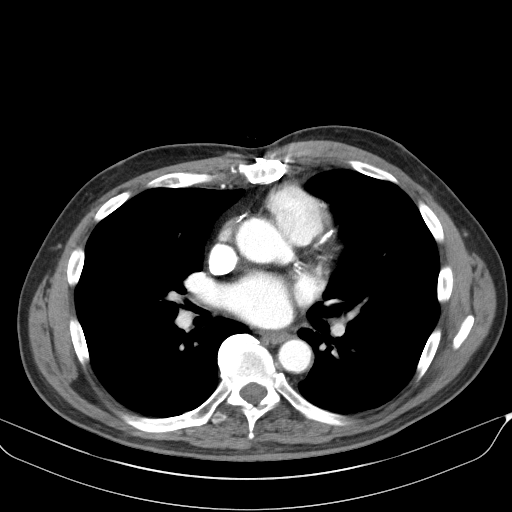
[im 120/131  soft-tissue]
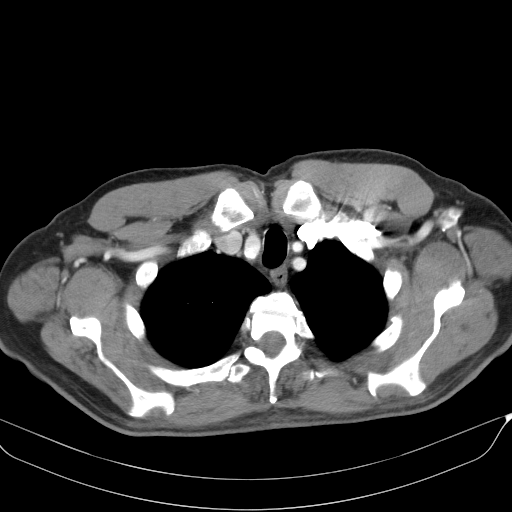

[Series 3: renal delay · axial · delayed · 0.76mm/px · z∈[+652,+832]mm · 3 of 37 slices shown, 7 images]
[im 1/37  soft-tissue]
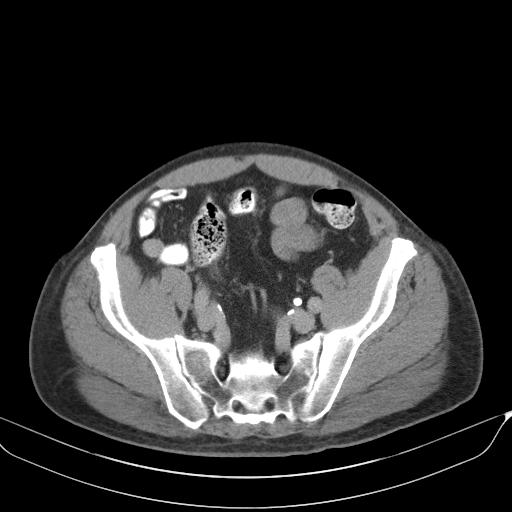
[im 1/37  lung]
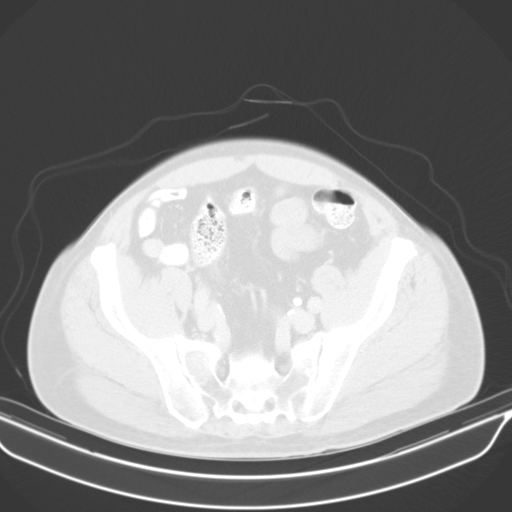
[im 1/37  bone]
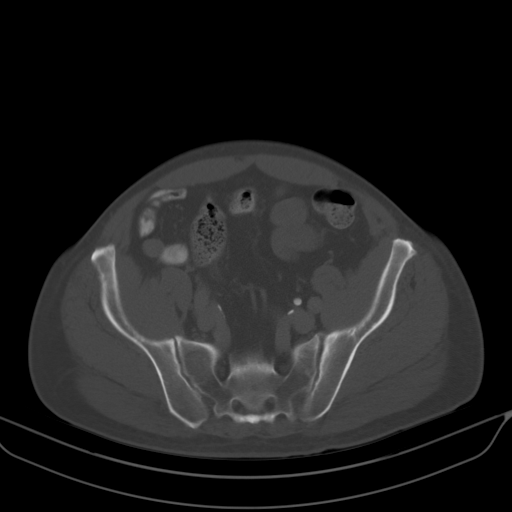
[im 19/37  soft-tissue]
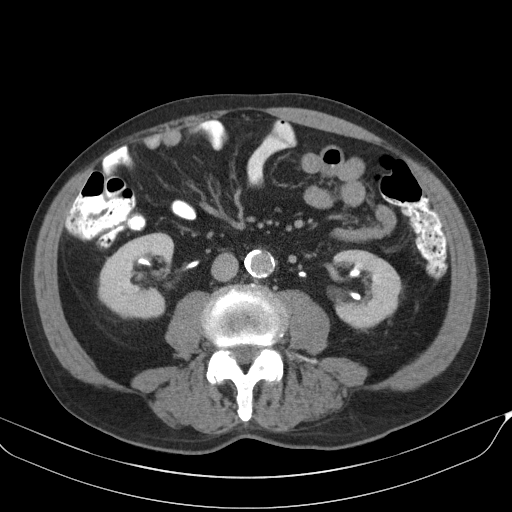
[im 19/37  lung]
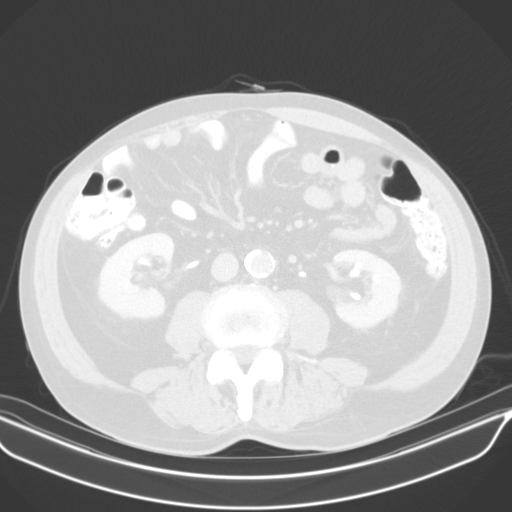
[im 37/37  soft-tissue]
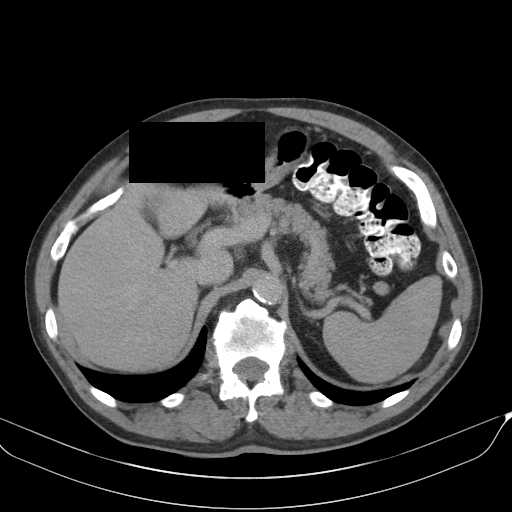
[im 37/37  lung]
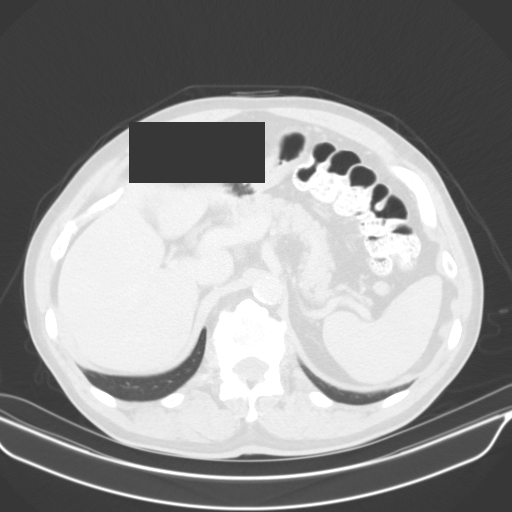

[Series 7: lung · axial · 0.62mm/px · z∈[+819,+913]mm · 3 of 175 slices shown]
[im 12/175  bone]
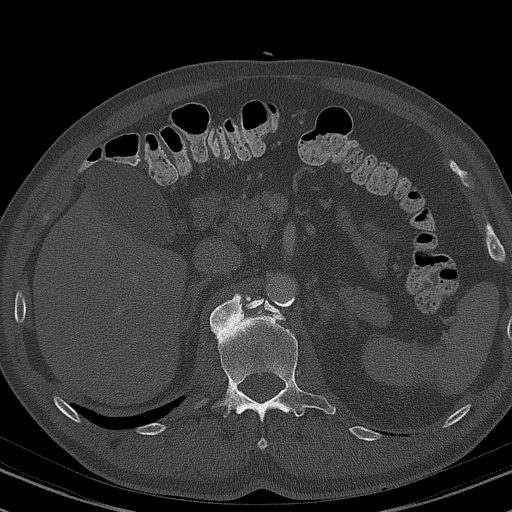
[im 35/175  bone]
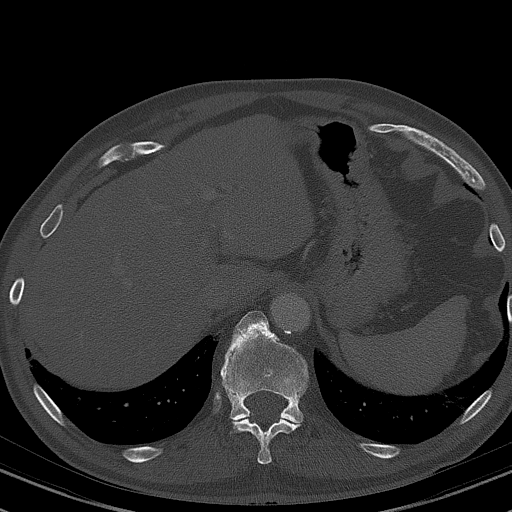
[im 59/175  bone]
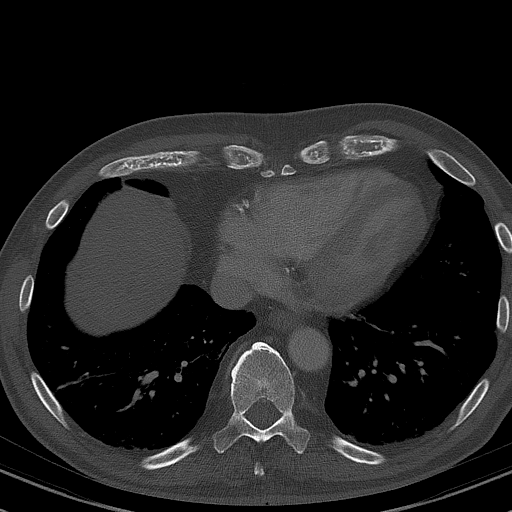

[14 of 32 positions shown; findings below may reference images not displayed]

FINDINGS: CT CHEST FINDINGS

Cardiovascular: Coronary artery calcification and aortic
atherosclerotic calcification. Post CABG.

Mediastinum/Nodes: No axillary supraclavicular adenopathy. No
mediastinal hilar adenopathy. No pericardial effusion. Esophagus
normal.

Lungs/Pleura: No suspicious nodularity. Linear atelectasis lung
bases. Airways normal.

Musculoskeletal: No aggressive osseous lesion.

CT ABDOMEN AND PELVIS FINDINGS

Hepatobiliary: The several small hypodense subcentimeter lesions are
noted in the liver. Example lesion 8 mm in subcapsular RIGHT hepatic
lobe on image 58/2. Lesion LEFT hepatic lobe measuring only 3 mm
image 52/2. The these lesions are noted on comparison noncontrast CT
from [DATE] and appear unchanged.

Pancreas: Pancreas is normal. No ductal dilatation. No pancreatic
inflammation.

Spleen: Normal spleen

Adrenals/urinary tract: Adrenal glands and kidneys are normal. The
ureters and bladder normal.

Stomach/Bowel: Stomach, small bowel, appendix, and cecum are normal.
The colon and rectosigmoid colon are normal.

Vascular/Lymphatic: Abdominal aorta is normal caliber with
atherosclerotic calcification. There is no retroperitoneal or
periportal lymphadenopathy. No pelvic lymphadenopathy.

Reproductive: Prostate normal.

Other: No peritoneal disease

Musculoskeletal: No aggressive osseous lesion.
IMPRESSION: Chest Impression:

1. No evidence of thoracic metastasis.
2. Post CABG.

Abdomen / Pelvis Impression:

1. No evidence of abdominopelvic metastasis or primary malignancy.
2. Several small lesions in liver are too small to characterize but
appears stable from comparison exams favored benign.
3.  Atherosclerotic calcification of the aorta.
4. Degenerate changes of the spine

## 2019-06-04 MED ORDER — IOPAMIDOL (ISOVUE-300) INJECTION 61%
100.0000 mL | Freq: Once | INTRAVENOUS | Status: AC | PRN
  Administered 2019-06-04: 100 mL via INTRAVENOUS

## 2019-06-08 ENCOUNTER — Telehealth: Payer: Self-pay | Admitting: Neurology

## 2019-06-08 NOTE — Telephone Encounter (Signed)
Called patient no answer left message with provider response on voice mail he can call back to discuss again if he would like

## 2019-06-08 NOTE — Telephone Encounter (Signed)
Only real abnormality I saw was the one we informed him about for which we put in the hematology consult.  Hopefully, he heard from that referral?  Looks like [employer] talked with him on 10/7.

## 2019-06-08 NOTE — Telephone Encounter (Signed)
Patient called to see if his lab results from last week are ready ready for him yet.

## 2019-06-19 ENCOUNTER — Inpatient Hospital Stay: Payer: Medicare Other | Attending: Oncology | Admitting: Oncology

## 2019-06-19 ENCOUNTER — Other Ambulatory Visit: Payer: Self-pay

## 2019-06-19 VITALS — BP 133/73 | HR 60 | Temp 98.7°F | Resp 17 | Ht 67.0 in | Wt 173.7 lb

## 2019-06-19 DIAGNOSIS — R760 Raised antibody titer: Secondary | ICD-10-CM | POA: Diagnosis not present

## 2019-06-19 DIAGNOSIS — I10 Essential (primary) hypertension: Secondary | ICD-10-CM | POA: Diagnosis not present

## 2019-06-19 DIAGNOSIS — Z7982 Long term (current) use of aspirin: Secondary | ICD-10-CM

## 2019-06-19 DIAGNOSIS — R42 Dizziness and giddiness: Secondary | ICD-10-CM | POA: Diagnosis not present

## 2019-06-19 DIAGNOSIS — Z803 Family history of malignant neoplasm of breast: Secondary | ICD-10-CM | POA: Diagnosis not present

## 2019-06-19 DIAGNOSIS — G939 Disorder of brain, unspecified: Secondary | ICD-10-CM

## 2019-06-19 DIAGNOSIS — I8290 Acute embolism and thrombosis of unspecified vein: Secondary | ICD-10-CM

## 2019-06-19 DIAGNOSIS — K219 Gastro-esophageal reflux disease without esophagitis: Secondary | ICD-10-CM

## 2019-06-19 NOTE — Progress Notes (Signed)
Reason for the request:    Lupus anticoagulant.  HPI: I was asked by Dr. Carles Collet to evaluate Mr. Coey for possible antiphospholipid antibody syndrome.  He is 78 year old with history of hypertension and GERD who was evaluated by Dr. Carles Collet in October 2020 for abnormal movement referred by Dr. Laverle Patter.  His evaluation at that time including MRI of the brain on May 22, 2019 which showed right occipital parietal lesion that shows enhancement measuring 2 mm in diameter that was indeterminate.  Subacute infarcts it is possible the metastatic disease was also on the differential.  He subsequently had a an episode of dizziness and presyncope on October 6 and was evaluated in the emergency department.  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  He had a work-up but to rule out malignancy including CT scan of the abdomen and pelvis as well as CT scan of the chest completed on June 04, 2019 which showed no evidence of malignancy.  A hypercoagulable panel as a part of his initial work-up showed a positive lupus anticoagulant that is presenting with elevated PTT did not correct with mixing studies.  His beta-2 glycoprotein IgM was elevated.  Anticardiolipin antibody also IgM was elevated.  He is also under evaluation from a cardiac standpoint is scheduled to have TEE in the future.  Clinically at this point, he reports no symptoms.  He was started on aspirin and does report periodic bruising related to it.  He is no longer reporting any dizziness or headaches.  He is chorea seems to has improved per his report by cutting down on his caffeine in general.  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  Since his dizziness episode he has not been cleared to drive but he feels he is able to.  He denies any history of deep vein thrombosis, pulmonary emboli, myocardial infarction or  documented CVA.    He does not report any headaches, blurry vision, syncope or seizures. Does not report any fevers, chills or sweats.  Does not report any cough, wheezing or hemoptysis.  Does not report any chest pain, palpitation, orthopnea or leg edema.  Does not report any nausea, vomiting or abdominal pain.  Does not report any constipation or diarrhea.  Does not report any skeletal complaints.    Does not report frequency, urgency or hematuria.  Does not report any skin rashes or lesions. Does not report any heat or cold intolerance.  Does not report any lymphadenopathy or petechiae.  Does not report any anxiety or depression.  Remaining review of systems is negative.    Past Medical History:  Diagnosis Date  . Abnormal nuclear stress test 03/05/2017  . Bibasilar crackles 02/26/2017  . Carotid stenosis, asymptomatic 07/27/2011  . DDD (degenerative disc disease)   . Dyspnea   . Dyspnea on exertion 01/28/2017  . ED (erectile dysfunction)   . Essential hypertension 01/29/2017  . GERD (gastroesophageal reflux disease)   . History of hiatal hernia   . History of kidney stones    passed stones - no surgery required  . History of wheezing 02/26/2017  . HTN (hypertension)   . Hyperlipidemia   . Hyperlipidemia with target low density lipoprotein (LDL) cholesterol less than 70 mg/dL 03/31/2014  . Impaired fasting glucose   . Knee pain    bilateral  . Left main coronary artery  disease 03/18/2017   Cardiac Cath: 55% dLM, tandem p-mLAD 75% --> referred for CABG.  . Mediastinal adenopathy 02/26/2017  . Meniere's disease   . Meniere's vertigo   . Migraines    occular - last one 07/2018  . Mild carotid artery disease ([place])   . Ocular migraine   . 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 Cyndia Bent @ [facility]):  LIMA-LAD, SVG-D2, SVG-OM, SVG-PDA (endoscopic SVG harvesting from right leg).  . Shoulder pain   . Wears hearing aid   :  Past Surgical History:  Procedure Laterality Date  . Cardiac  Stress Test  2008   Normal  . CARPAL TUNNEL RELEASE Left 1998  . CARPAL TUNNEL RELEASE Right 2008  . COLONOSCOPY  06/2013   Henrene Pastor - polyps  . CORONARY ARTERY BYPASS GRAFT N/A 04/04/2017   Procedure: CORONARY ARTERY BYPASS GRAFTING x 4  LIMA-LAD, SVG-D2, SVG-OM, SVG-PDA (endoscopic SVG harvesting from right leg);  Surgeon: Gaye Pollack, MD;  Location: [place];  Service: Open Heart Surgery;  Laterality: N/A;  . EXTRACORPOREAL SHOCK WAVE LITHOTRIPSY Right 10/23/2018   Procedure: EXTRACORPOREAL SHOCK WAVE LITHOTRIPSY (ESWL);  Surgeon: Bjorn Loser, MD;  Location: [facility];  Service: Urology;  Laterality: Right;  . HERNIA REPAIR Left    inguinal  . HIATAL HERNIA REPAIR     no surgery per pt  . KNEE CARTILAGE SURGERY Bilateral   . LEFT HEART CATH AND CORONARY ANGIOGRAPHY N/A 03/18/2017   Procedure: Left Heart Cath and Coronary Angiography;  Surgeon: Leonie Man, MD;  Location: [place] [facility];  Service: Cardiovascular:  55% dLM, p-mLAD 75% - mLAD 75%.  EF 55-65%. Mod non-obstructive RCA disease --> referred for CABG  . MEDIAL PARTIAL KNEE REPLACEMENT Right 2010   Partial right knee replacement  . NM MYOVIEW LTD  01/2017   INTERMEDIATE RISK: Exercise tolerance was good at 10 minutes, however, fatigue and dyspnea were reporte-d -> hypertensive response to exercise.  25mm Horizontal ST depressions noted during stress in the II, III, aVF, V6, V5 and V4 leads c/w ischemia.   Small siize, moderate severity perfusion defect  mid to distal anterior wall perfusion defect suggestive of ischemia..   . ROTATOR CUFF REPAIR Left 2008  . SHOULDER ARTHROSCOPY W/ ROTATOR CUFF REPAIR Right 06/15/2016  . TEE WITHOUT CARDIOVERSION N/A 04/04/2017   Procedure: TRANSESOPHAGEAL ECHOCARDIOGRAM (TEE);  Surgeon: Gaye Pollack, MD;  Location: [place];  Service: Open Heart Surgery;  Laterality: N/A;  . TONSILLECTOMY    :   Current Outpatient Medications:  .  Cyanocobalamin (VITAMIN B-12 PO), Take 1 tablet by mouth  every evening. , Disp: , Rfl:  .  EPINEPHrine 0.3 mg/0.3 mL IJ SOAJ injection, USE AS DIRECTED AS NEEDED FOR SEVERE ALLERGIC REACTION, Disp: , Rfl:  .  ezetimibe (ZETIA) 10 MG tablet, Take 10 mg by mouth every evening. , Disp: , Rfl:  .  famotidine (PEPCID) 10 MG tablet, Take 10 mg by mouth 2 (two) times daily., Disp: , Rfl:  .  pravastatin (PRAVACHOL) 20 MG tablet, , Disp: , Rfl:  .  tadalafil (ADCIRCA/CIALIS) 20 MG tablet, Take 20 mg by mouth daily as needed for erectile dysfunction., Disp: , Rfl:  .  valsartan (DIOVAN) 80 MG tablet, Take 80 mg by mouth daily., Disp: , Rfl: :  Allergies  Allergen Reactions  . Crestor [Rosuvastatin Calcium] Other (See Comments)    Muscle aches   :  Family History  Problem Relation Age of Onset  .  Heart failure Mother   . Cancer Mother        breast  . Coronary artery disease Father        CABG  . Heart attack Father        Long-term smoker.  . Hyperlipidemia Father   . Hyperlipidemia Brother   . Hypertension Brother   . Hyperlipidemia Son   . Healthy Daughter   . Colon cancer Neg Hx   . Colon polyps Neg Hx   . Rectal cancer Neg Hx   . Stomach cancer Neg Hx   :  Social History   Socioeconomic History  . Marital status: Married    Spouse name: Not on file  . Number of children: 2  . Years of education: Not on file  . Highest education level: Master's degree (e.g., MA, MS, MEng, MEd, MSW, MBA)  Occupational History  . Occupation: retired    Comment: Geographical information systems officer  . Financial resource strain: Not on file  . Food insecurity    Worry: Not on file    Inability: Not on file  . Transportation needs    Medical: Not on file    Non-medical: Not on file  Tobacco Use  . Smoking status: Never Smoker  . Smokeless tobacco: Never Used  Substance and Sexual Activity  . Alcohol use: Yes    Comment: 1 drink a week (decreased it when wife quit driinking) - stopped that 2 months ago  . Drug use: No  . Sexual activity: Yes  Lifestyle  .  Physical activity    Days per week: Not on file    Minutes per session: Not on file  . Stress: Not on file  Relationships  . Social Herbalist on phone: Not on file    Gets together: Not on file    Attends religious service: Not on file    Active member of club or organization: Not on file    Attends meetings of clubs or organizations: Not on file    Relationship status: Not on file  . Intimate partner violence    Fear of current or ex partner: Not on file    Emotionally abused: Not on file    Physically abused: Not on file    Forced sexual activity: Not on file  Other Topics Concern  . Not on file  Social History Narrative   Jordan [name] is a very pleasant married gentleman with 2 children [name] and [place]) each with 3 children/total 6 grandchildren. He is currently retired now from his original job in [employer], however he still serves on [employer] at [employer] and recently stepped down from [employer] at [employer].   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    For the last several months he has been off caffeine and wine because of GI upset symptoms and palpitations. Also trying to lose weight.     He used to drink maybe 2 glasses of wine at night, and he is subsequently all quit alcohol.   He is relatively active with exercise but not with routine pattern for standard routine. He does push AND other calisthenics as well as kayaking and bike riding.   :  Pertinent items are noted in HPI.  Exam: Blood pressure 133/73, pulse 60, temperature 98.7 F (37.1 C), temperature source Oral, resp. rate 17, height 5\' 7"  (1.702 m), weight 173 lb 11.2 oz (78.8 kg),  SpO2 97 %.  ECOG 0 General appearance: alert and cooperative appeared without distress. Head: atraumatic without any abnormalities. Eyes: conjunctivae/corneas clear. PERRL.  Sclera anicteric. Throat: lips, mucosa, and tongue normal; without oral thrush or  ulcers. Resp: clear to auscultation bilaterally without rhonchi, wheezes or dullness to percussion. Cardio: regular rate and rhythm, S1, S2 normal, no murmur, click, rub or gallop GI: soft, non-tender; bowel sounds normal; no masses,  no organomegaly Skin: Skin color, texture, turgor normal. No rashes or lesions Lymph nodes: Cervical, supraclavicular, and axillary nodes normal. Neurologic: Grossly normal without any motor, sensory or deep tendon reflexes. Musculoskeletal: No joint deformity or effusion.  CBC    Component Value Date/Time   WBC 7.1 05/26/2019 1622   RBC 4.52 05/26/2019 1622   HGB 14.3 05/26/2019 1630   HGB 15.7 03/04/2017 0000   HCT 42.0 05/26/2019 1630   HCT 47.7 03/04/2017 0000   PLT 179 05/26/2019 1622   PLT 207 03/04/2017 0000   MCV 94.9 05/26/2019 1622   MCV 92 03/04/2017 0000   MCH 33.0 05/26/2019 1622   MCHC 34.7 05/26/2019 1622   RDW 12.5 05/26/2019 1622   RDW 13.9 03/04/2017 0000   LYMPHSABS 1.4 05/26/2019 1622   MONOABS 0.9 05/26/2019 1622   EOSABS 0.2 05/26/2019 1622   BASOSABS 0.0 05/26/2019 1622     Chemistry      Component Value Date/Time   NA 140 05/26/2019 1630   NA 145 (H) 04/24/2017 1103   K 4.4 05/26/2019 1630   CL 102 05/26/2019 1630   CO2 27 05/26/2019 1622   BUN 25 (H) 05/26/2019 1630   BUN 19 04/24/2017 1103   CREATININE 1.10 05/26/2019 1630      Component Value Date/Time   CALCIUM 9.5 05/26/2019 1622   ALKPHOS 50 05/26/2019 1622   AST 22 05/26/2019 1622   ALT 20 05/26/2019 1622   BILITOT 0.7 05/26/2019 1622      Results for Parrella, Veasna DAVID "DAVID" (MRN [id number]) as of 06/19/2019 11:17  Ref. Range 05/25/2019 10:33  Anticardiolipin Ab,IgA,Qn Latest Ref Range: <=11 APL <11  Anticardiolipin Ab,IgG,Qn Latest Ref Range: <=14 GPL <14  Anticardiolipin Ab,IgM,Qn Latest Ref Range: <=12 MPL 102 (H)  PTT Lupus Anticoagulant Latest Ref Range: 0.0 - 51.9 sec 68.0 (H)  DRVVT Latest Ref Range: 0.0 - 47.0 sec 106.0 (H)  Lupus  Anticoag Interp Unknown Comment:  Hexagonal Phase Phospholipid Latest Ref Range: 0 - 11 sec 46 (H)  PTT-LA Mix Latest Ref Range: 0.0 - 48.9 sec 61.4 (H)  Beta-2 Glycoprotein I Ab, IgG Latest Ref Range: <=20 SGU <9  Beta-2 Glyco 1 IgM Latest Ref Range: <=20 SMU >150 (H)  Beta-2 Glyco 1 IgA Latest Ref Range: <=20 SAU 17  dRVVT Mix Latest Ref Range: 0.0 - 47.0 sec 72.3 (H)  dRVVT Confirm Latest Ref Range: 0.8 - 1.2 ratio 2.6 (H)      Ct Chest W Contrast  Result Date: 06/04/2019 CLINICAL DATA:  Small indeterminate lesions in the brain on MRI. Metastatic disease in the differential. EXAM: CT CHEST, ABDOMEN, AND PELVIS WITH CONTRAST TECHNIQUE: Multidetector CT imaging of the chest, abdomen and pelvis was performed following the standard protocol during bolus administration of intravenous contrast. CONTRAST:  152mL ISOVUE-300 IOPAMIDOL (ISOVUE-300) INJECTION 61% COMPARISON:  Brain MRI 05/22/2019 FINDINGS: CT CHEST FINDINGS Cardiovascular: Coronary artery calcification and aortic atherosclerotic calcification. Post CABG. Mediastinum/Nodes: No axillary supraclavicular adenopathy. No mediastinal hilar adenopathy. No pericardial effusion. Esophagus normal. Lungs/Pleura: No suspicious nodularity. Linear atelectasis lung bases.  Airways normal. Musculoskeletal: No aggressive osseous lesion. CT ABDOMEN AND PELVIS FINDINGS Hepatobiliary: The several small hypodense subcentimeter lesions are noted in the liver. Example lesion 8 mm in subcapsular RIGHT hepatic lobe on image 58/2. Lesion LEFT hepatic lobe measuring only 3 mm image 52/2. The these lesions are noted on comparison noncontrast CT from October 22, 2018 and appear unchanged. Pancreas: Pancreas is normal. No ductal dilatation. No pancreatic inflammation. Spleen: Normal spleen Adrenals/urinary tract: Adrenal glands and kidneys are normal. The ureters and bladder normal. Stomach/Bowel: Stomach, small bowel, appendix, and cecum are normal. The colon and rectosigmoid  colon are normal. Vascular/Lymphatic: Abdominal aorta is normal caliber with atherosclerotic calcification. There is no retroperitoneal or periportal lymphadenopathy. No pelvic lymphadenopathy. Reproductive: Prostate normal. Other: No peritoneal disease Musculoskeletal: No aggressive osseous lesion. IMPRESSION: Chest Impression: 1. No evidence of thoracic metastasis. 2. Post CABG. Abdomen / Pelvis Impression: 1. No evidence of abdominopelvic metastasis or primary malignancy. 2. Several small lesions in liver are too small to characterize but appears stable from comparison exams favored benign. 3.  Atherosclerotic calcification of the aorta. 4. Degenerate changes of the spine Electronically Signed   By: Suzy Bouchard M.D.   On: 06/04/2019 10:32   Mr Brain Wo Contrast  Result Date: 05/26/2019 CLINICAL DATA:  Dizziness EXAM: MRI HEAD WITHOUT CONTRAST TECHNIQUE: Multiplanar, multiecho pulse sequences of the brain and surrounding structures were obtained without intravenous contrast. COMPARISON:  None. FINDINGS: BRAIN: There is no acute infarct, acute hemorrhage or extra-axial collection. The white matter signal is normal for the patient's age. The cerebral and cerebellar volume are age-appropriate. There is no hydrocephalus. The midline structures are normal. 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 VASCULAR: The major intracranial arterial and venous sinus flow voids are normal. Susceptibility-sensitive sequences show no chronic microhemorrhage or superficial siderosis. SKULL AND UPPER CERVICAL SPINE: Calvarial bone marrow signal is normal. There is no skull base mass. The visualized upper cervical spine and soft tissues are normal. SINUSES/ORBITS: There are no fluid levels or advanced mucosal thickening. The mastoid air cells and middle ear cavities are free of fluid. The orbits are normal. IMPRESSION: 1. No acute intracranial abnormality. 2. 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 Electronically  Signed   By: Ulyses Jarred M.D.   On: 05/26/2019 23:18   Mr Brain W Contrast  Result Date: 05/22/2019 CLINICAL DATA:  Follow-up abnormal MRI. Rule out infarct versus metastatic disease. EXAM: MRI HEAD WITH CONTRAST TECHNIQUE: Multiplanar, multiecho pulse sequences of the brain and surrounding structures were obtained with intravenous contrast. CONTRAST:  57mL GADAVIST GADOBUTROL 1 MMOL/ML IV SOLN COMPARISON:  MRI brain without contrast 05/19/2019 FINDINGS: Three small areas of restricted diffusion are noted on the recent MRI Lesion in the right occipital parietal lobe shows punctate enhancement measuring 2 mm on axial image 79. This showed restricted diffusion on the prior study. Small area of restricted diffusion in the right mid frontal cortex does not show abnormal enhancement. Small area of restricted diffusion in the left prefrontal cortex shows a small area of enhancement which I believe is most likely vascular enhancement. No other enhancing lesions in the brain. No enhancing calvarial lesions identified. IMPRESSION: Right occipital parietal lesion does show enhancement. This lesion measures 2 mm in diameter and is indeterminate. Metastatic disease is in the differential. Subacute infarct with enhancement is possible. Close follow-up recommended. The patient has no known history of malignancy therefore metastatic disease is considered less likely. Additional frontal lesions bilaterally do not show abnormal enhancement and  likely are areas of infarct. Follow-up MRI without with contrast suggested in 4-6 weeks. Electronically Signed   By: Franchot Gallo M.D.   On: 05/22/2019 11:05   Ct Abdomen Pelvis W Contrast  Result Date: 06/04/2019 CLINICAL DATA:  Small indeterminate lesions in the brain on MRI. Metastatic disease in the differential. EXAM: CT CHEST, ABDOMEN, AND PELVIS WITH CONTRAST TECHNIQUE: Multidetector CT imaging of the chest, abdomen and pelvis was performed following the standard protocol  during bolus administration of intravenous contrast. CONTRAST:  187mL ISOVUE-300 IOPAMIDOL (ISOVUE-300) INJECTION 61% COMPARISON:  Brain MRI 05/22/2019 FINDINGS: CT CHEST FINDINGS Cardiovascular: Coronary artery calcification and aortic atherosclerotic calcification. Post CABG. Mediastinum/Nodes: No axillary supraclavicular adenopathy. No mediastinal hilar adenopathy. No pericardial effusion. Esophagus normal. Lungs/Pleura: No suspicious nodularity. Linear atelectasis lung bases. Airways normal. Musculoskeletal: No aggressive osseous lesion. CT ABDOMEN AND PELVIS FINDINGS Hepatobiliary: The several small hypodense subcentimeter lesions are noted in the liver. Example lesion 8 mm in subcapsular RIGHT hepatic lobe on image 58/2. Lesion LEFT hepatic lobe measuring only 3 mm image 52/2. The these lesions are noted on comparison noncontrast CT from October 22, 2018 and appear unchanged. Pancreas: Pancreas is normal. No ductal dilatation. No pancreatic inflammation. Spleen: Normal spleen Adrenals/urinary tract: Adrenal glands and kidneys are normal. The ureters and bladder normal. Stomach/Bowel: Stomach, small bowel, appendix, and cecum are normal. The colon and rectosigmoid colon are normal. Vascular/Lymphatic: Abdominal aorta is normal caliber with atherosclerotic calcification. There is no retroperitoneal or periportal lymphadenopathy. No pelvic lymphadenopathy. Reproductive: Prostate normal. Other: No peritoneal disease Musculoskeletal: No aggressive osseous lesion. IMPRESSION: Chest Impression: 1. No evidence of thoracic metastasis. 2. Post CABG. Abdomen / Pelvis Impression: 1. No evidence of abdominopelvic metastasis or primary malignancy. 2. Several small lesions in liver are too small to characterize but appears stable from comparison exams favored benign. 3.  Atherosclerotic calcification of the aorta. 4. Degenerate changes of the spine Electronically Signed   By: Suzy Bouchard M.D.   On: 06/04/2019 10:32       Assessment and Plan:   78 year old with:  1.  Positive lupus anticoagulant with elevated anticardiolipin antibody as well as beta-2 glycoprotein IgM subtype.  This is a in the setting of abnormal MRI with possible chronic infarcts noted.  The natural course of this disease as well as the likelihood of antiphospholipid antibody syndrome was discussed today.  Ramification including anticoagulation with aspirin, warfarin or both was reviewed today in detail with patient face-to-face and with his wife via phone.  It is unclear to me that he is developing recurrent thromboembolism.  He is undergoing cardiac evaluation with a TEE to evaluate whether he has PFO and the possibly evaluation for atrial fibrillation.  If he is documented to have recurrent thromboembolism via PFO then full dose anticoagulation with warfarin would be warranted at this time.  If no documentation of recurrent thrombosis is noted, then aspirin should be adequate at this time at the current dosing schedule.  I see no convincing evidence at this time that he has recurrent thrombosis from venous or arterial origin to warrant lifetime full dose anticoagulation and increased risk of bleeding that associated with warfarin plus aspirin.  However, if he develops documented thromboembolism at any point then full dose anticoagulation will be needed.  I have also recommended repeating his antiphospholipid antibody panel in 12 weeks to ensure that we are dealing with a true finding rather than a false positive.  2.  Dizziness and abnormal movement: Is currently under evaluation by  neurology cardiology to rule out atrial fibrillation possible PFO.  I see no objections to full evaluation including atrial fibrillation with arrhythmia monitoring.  3.  Follow-up: We will be the next 2 to 3 months for repeat laboratory testing and follow-up on his clinical status.   40  minutes was spent with the patient face-to-face today.  More than  50% of time was spent on reviewing his imaging studies, laboratory data, discussing differential diagnosis and management options for the future.     Thank you for the referral. A copy of this consult has been forwarded to the requesting physician.

## 2019-06-22 ENCOUNTER — Telehealth: Payer: Self-pay | Admitting: Oncology

## 2019-06-22 ENCOUNTER — Telehealth: Payer: Self-pay | Admitting: Cardiology

## 2019-06-22 NOTE — Telephone Encounter (Signed)
New Message  Patient is calling in to see if Dr. Curt Bears still wants him to get the loop recorder implanted. Please give patient a call back to discuss.

## 2019-06-22 NOTE — Telephone Encounter (Signed)
Scheduled appt per 10/30 los. ° °Sent a staff message to get a calendar mailed out. °

## 2019-06-25 ENCOUNTER — Telehealth: Payer: Self-pay | Admitting: Cardiology

## 2019-06-25 NOTE — Telephone Encounter (Signed)
Called and spoke to patient. He states that his hematologist, Dr. Dion Body, said that he does not have cancer and that he does not need to be on a blood thinner medicine. He states that he only wants him to be on ASA 81 mg QD. Will forward information to Dr. Curt Bears and his RN to make aware.

## 2019-06-25 NOTE — Telephone Encounter (Signed)
Needs LINQ monitor scheduled for AF eval.

## 2019-06-25 NOTE — Telephone Encounter (Signed)
New Message     Pt is calling and says he saw Dr Ester Rink and is calling back to see if he needs the loop recorder    Please call back

## 2019-06-26 NOTE — Telephone Encounter (Signed)
Left message informing pt I would be in touch next week to arrange TEE/ILR per [facility] recommendation

## 2019-06-29 NOTE — Telephone Encounter (Signed)
Patient is returning call to set up EET/ILR

## 2019-06-30 ENCOUNTER — Telehealth: Payer: Self-pay | Admitting: Neurology

## 2019-06-30 NOTE — Telephone Encounter (Signed)
Paraneoplastic antibody panel was completed from [facility] and was negative.  Please let the patient know that [facility] testing for cancerous antibodies was negative.

## 2019-07-01 NOTE — Telephone Encounter (Signed)
Left message to call office back or check mychart for a message

## 2019-07-01 NOTE — Telephone Encounter (Signed)
lmtcb

## 2019-07-09 ENCOUNTER — Telehealth: Payer: Self-pay | Admitting: Cardiology

## 2019-07-09 DIAGNOSIS — I10 Essential (primary) hypertension: Secondary | ICD-10-CM | POA: Diagnosis not present

## 2019-07-09 DIAGNOSIS — D6862 Lupus anticoagulant syndrome: Secondary | ICD-10-CM | POA: Diagnosis not present

## 2019-07-09 DIAGNOSIS — I771 Stricture of artery: Secondary | ICD-10-CM | POA: Diagnosis not present

## 2019-07-09 DIAGNOSIS — R251 Tremor, unspecified: Secondary | ICD-10-CM | POA: Diagnosis not present

## 2019-07-09 DIAGNOSIS — R6889 Other general symptoms and signs: Secondary | ICD-10-CM | POA: Diagnosis not present

## 2019-07-09 DIAGNOSIS — R9089 Other abnormal findings on diagnostic imaging of central nervous system: Secondary | ICD-10-CM | POA: Diagnosis not present

## 2019-07-09 DIAGNOSIS — R7301 Impaired fasting glucose: Secondary | ICD-10-CM | POA: Diagnosis not present

## 2019-07-09 DIAGNOSIS — I6340 Cerebral infarction due to embolism of unspecified cerebral artery: Secondary | ICD-10-CM | POA: Diagnosis not present

## 2019-07-09 DIAGNOSIS — I2510 Atherosclerotic heart disease of native coronary artery without angina pectoris: Secondary | ICD-10-CM | POA: Diagnosis not present

## 2019-07-09 DIAGNOSIS — Z5181 Encounter for therapeutic drug level monitoring: Secondary | ICD-10-CM | POA: Diagnosis not present

## 2019-07-09 NOTE — Telephone Encounter (Signed)
Left a message for the patient stating that I could not find anything stating why he was called earlier today. I let patient know that he did not need to call the office back at this time.

## 2019-07-09 NOTE — Telephone Encounter (Signed)
lmtcb

## 2019-07-09 NOTE — Telephone Encounter (Signed)
New message:      Patient calling stating that someone call him. I did not see a note. Please call patient.

## 2019-07-24 ENCOUNTER — Other Ambulatory Visit: Payer: Medicare Other

## 2019-07-24 ENCOUNTER — Ambulatory Visit
Admission: RE | Admit: 2019-07-24 | Discharge: 2019-07-24 | Disposition: A | Discharge status: Home / Self Care | Payer: Medicare Other | Source: Ambulatory Visit | Attending: Internal Medicine | Admitting: Internal Medicine

## 2019-07-24 DIAGNOSIS — R911 Solitary pulmonary nodule: Secondary | ICD-10-CM | POA: Diagnosis not present

## 2019-07-24 DIAGNOSIS — R590 Localized enlarged lymph nodes: Secondary | ICD-10-CM

## 2019-07-24 IMAGING — CT CT CHEST W/ CM
2 of 4 series · 14 of 36 positions shown, 17 images · IV contrast (iopamidol)
Comparison: [DATE]

CLINICAL DATA: Follow-up lung nodule.

EXAM:
CT CHEST WITH CONTRAST
TECHNIQUE: Multidetector CT imaging of the chest was performed during
intravenous contrast administration.
CONTRAST:  75mL [X4] IOPAMIDOL ([X4]) INJECTION 61%

[Series 2: chest 2.00 br40 s3 · axial · 0.62mm/px · z∈[+1489,+1785]mm · 11 of 176 slices shown, 14 images (1 of 2)]
[im 14/176  mediastinal]
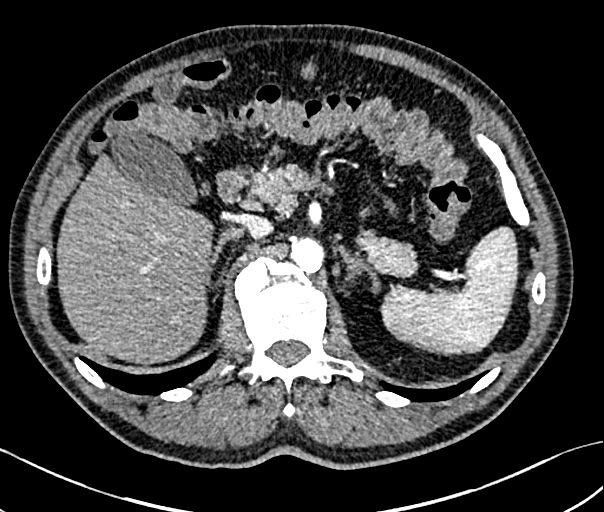
[im 14/176  lung]
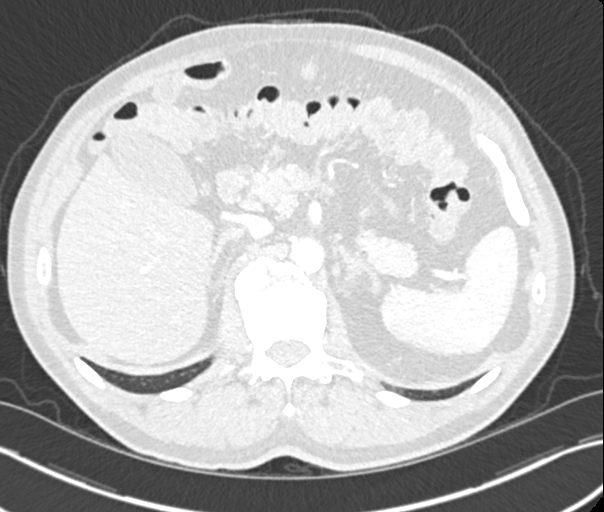
[im 27/176  lung]
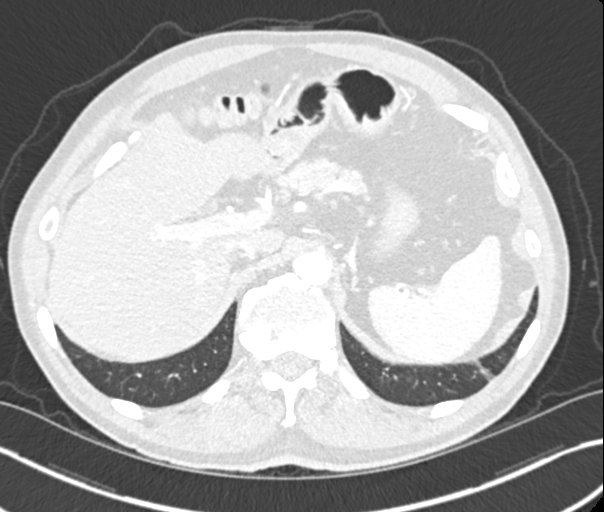
[im 41/176  lung]
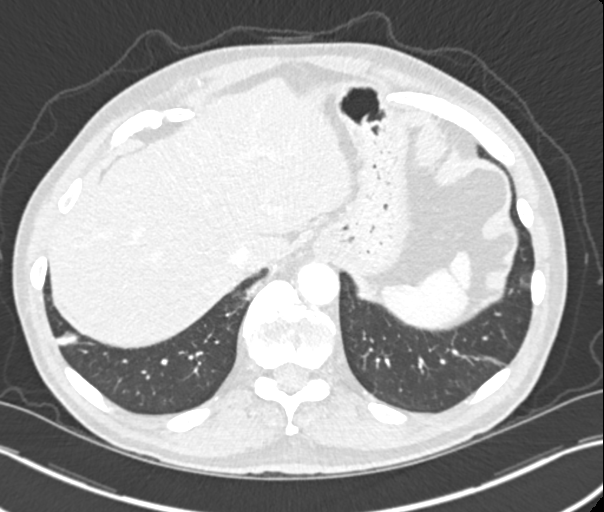
[im 54/176  lung]
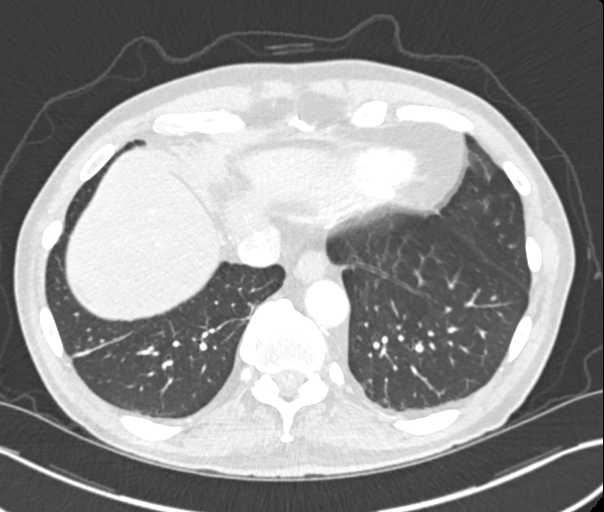
[im 68/176  mediastinal]
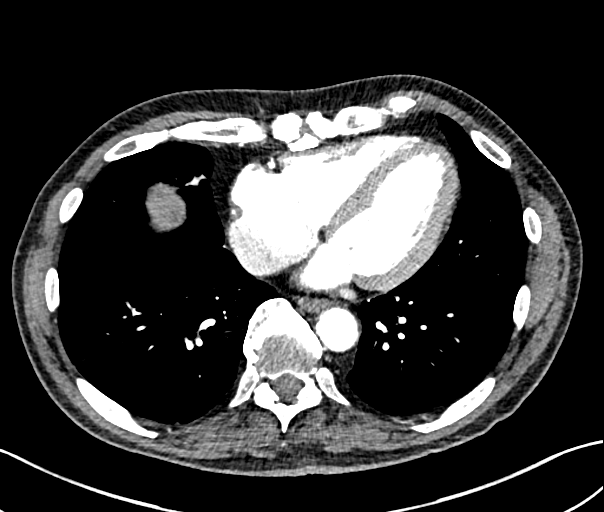
[im 68/176  lung]
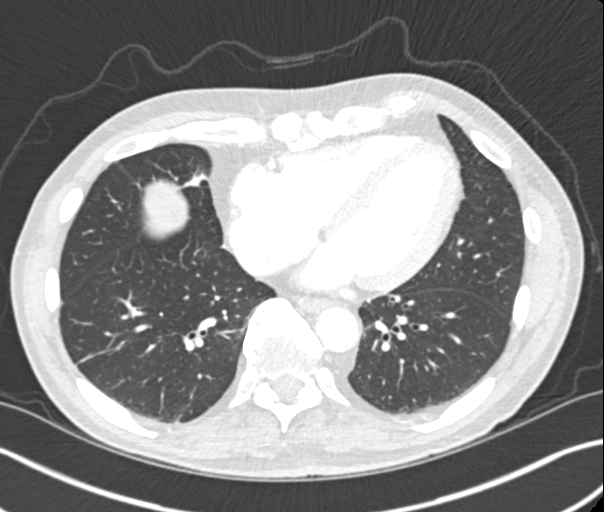
[im 95/176  lung]
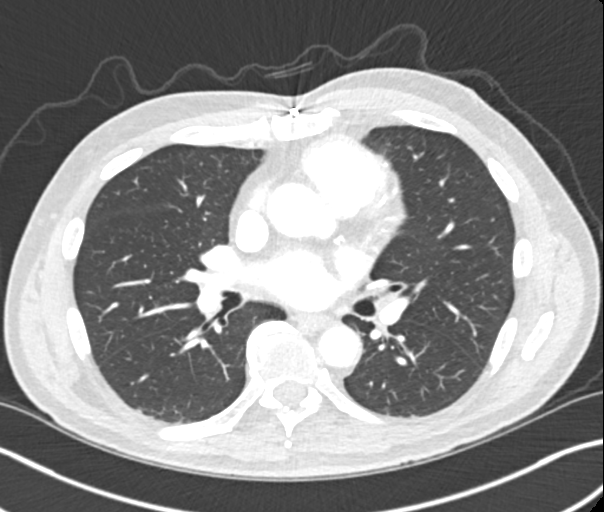
[im 108/176  lung]
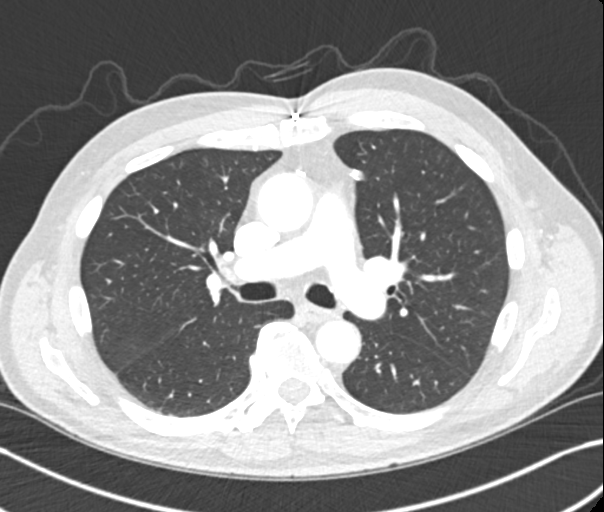
[im 122/176  lung]
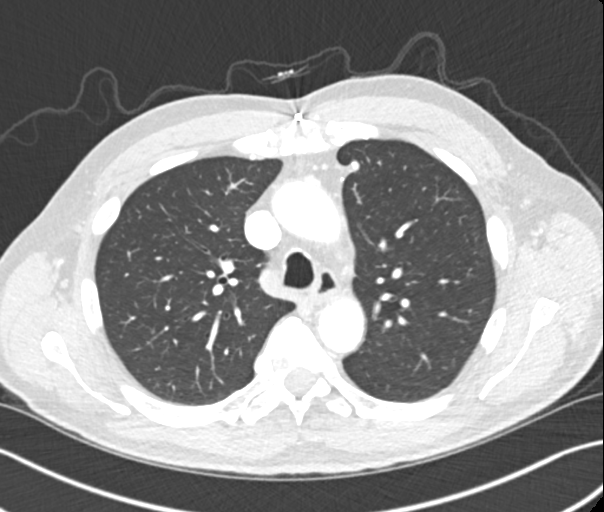
[im 135/176  mediastinal]
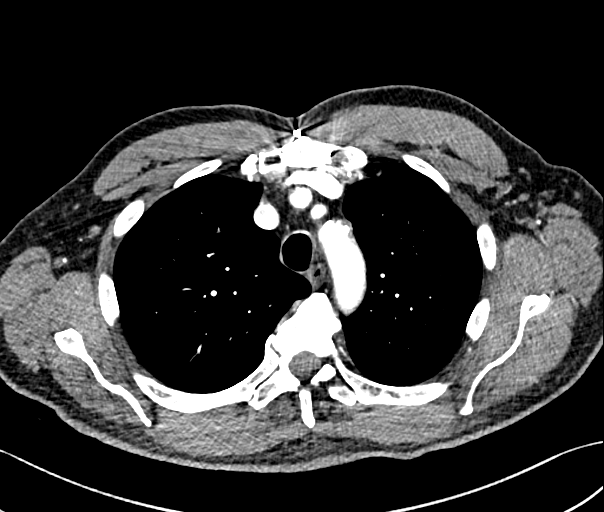
[im 135/176  lung]
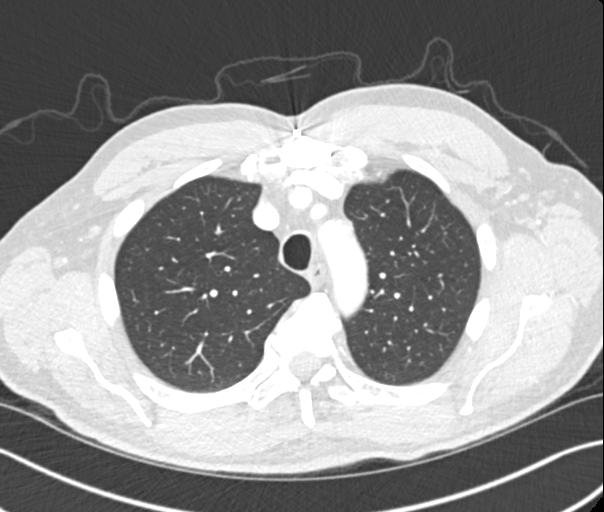
[im 149/176  lung]
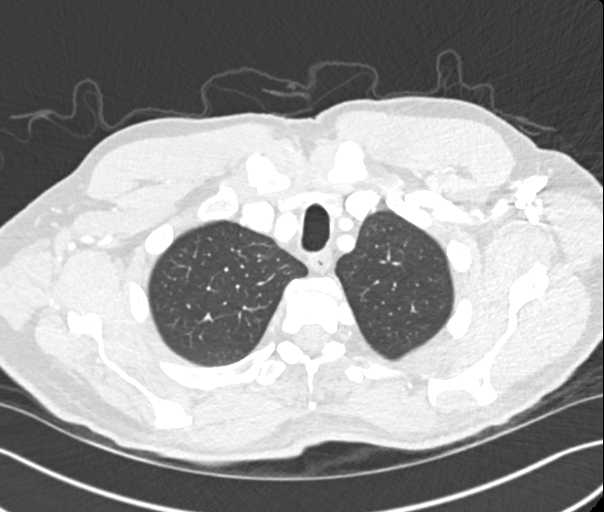
[im 162/176  lung]
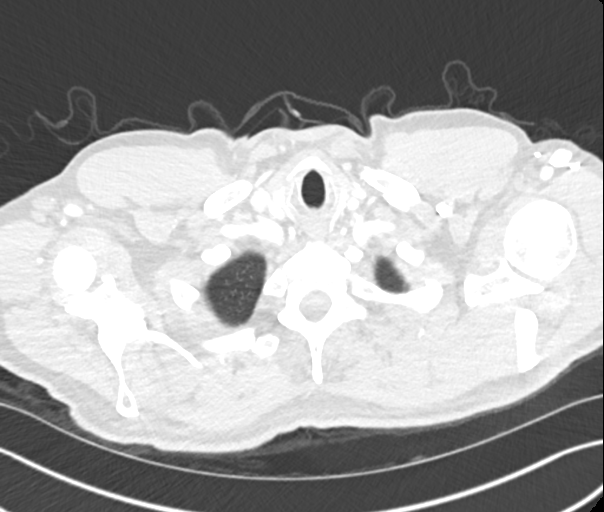

[Series 4: chest 2.00 br40 s3 · coronal · 0.69mm/px · 3 of 159 slices shown (2 of 2)]
[im 32/159  lung]
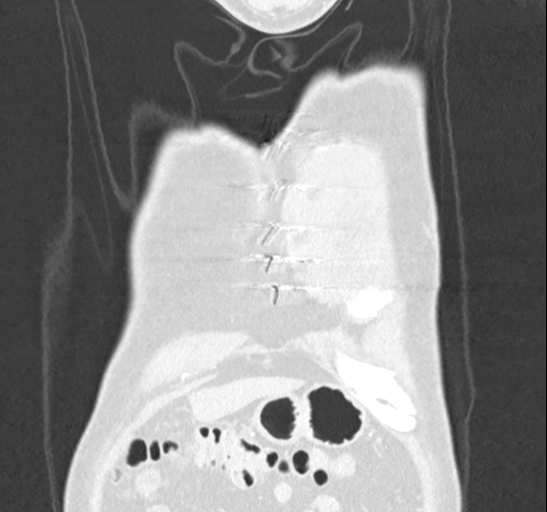
[im 64/159  lung]
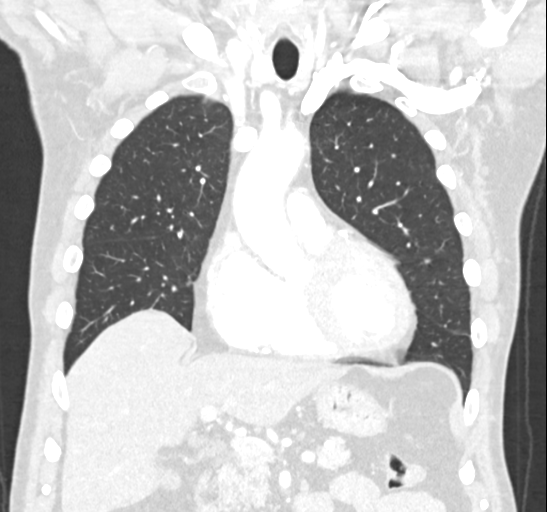
[im 95/159  lung]
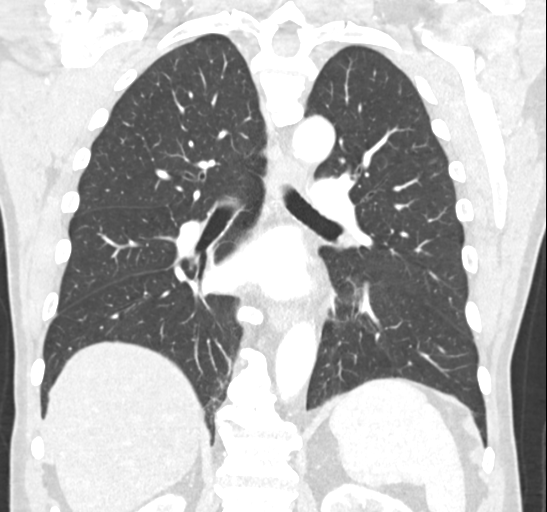

[14 of 36 positions shown; findings below may reference images not displayed]

FINDINGS: Cardiovascular: Previous median sternotomy in CABG procedure. The
heart size is within normal limits. Aortic atherosclerosis.

Mediastinum/Nodes: No enlarged mediastinal, hilar, or axillary lymph
nodes. Thyroid gland, trachea, and esophagus demonstrate no
significant findings.

Lungs/Pleura: No pleural effusion. Linear areas of scarring are
identified within the right middle lobe, lingula and right lower
lobe. No suspicious pulmonary nodules.

Upper Abdomen: 5 mm low-attenuation structure within lateral right
lobe of liver is identified unchanged and too small to characterize.
Similarly there is a 5 mm low-density structure along the dome of
right lobe of liver, unchanged. No acute abnormality.

Musculoskeletal: Degenerative type changes are noted involving both
glenohumeral joints. Thoracic spondylosis. No aggressive lytic or
sclerotic bone lesions.
IMPRESSION: 1. No active cardiopulmonary abnormalities. No suspicious pulmonary
nodules identified.
2. Aortic atherosclerosis.

Aortic Atherosclerosis ([X4]-[X4]).

## 2019-07-24 MED ORDER — IOPAMIDOL (ISOVUE-300) INJECTION 61%
75.0000 mL | Freq: Once | INTRAVENOUS | Status: AC | PRN
  Administered 2019-07-24: 11:00:00 75 mL via INTRAVENOUS

## 2019-07-28 ENCOUNTER — Encounter: Payer: Self-pay | Admitting: Neurology

## 2019-07-28 NOTE — Progress Notes (Signed)
Virtual Visit via Video Note The purpose of this virtual visit is to provide medical care while limiting exposure to the novel coronavirus.    Consent was obtained for video visit:  Yes.   Answered questions that patient had about telehealth interaction:  Yes.   I discussed the limitations, risks, security and privacy concerns of performing an evaluation and management service by telemedicine. I also discussed with the patient that there may be a patient responsible charge related to this service. The patient expressed understanding and agreed to proceed.  Pt location: Home Physician Location: office Name of referring provider:  Crist Infante, MD I connected with Bb Rozeboom Clendenin at patients initiation/request on 07/29/2019 at 10:15 AM EST by video enabled telemedicine application and verified that I am speaking with the correct person using two identifiers. 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 Numerous records have been reviewed which took 35 min of non face to face time.   Patient had repeat MRI of the brain the day after I saw him, but this was done without contrast.  This was nonacute.  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  There was an old infarct at the gray-white junction on the left.  CT abdomen and pelvis with contrast were done on October 15 and there was no evidence of metastasis or primary malignancy.  There were several small lesions in the liver that were too small to characterize but appeared stable from comparison examinations and felt benign.  CT of the chest was done both October 15 and July 24, 2019 and was unremarkable.  Paraneoplastic antibody panel was completed from [facility] and was negative.  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  He saw hematology on October 30.  I reviewed the records.  He is going to repeat his antiphospholipid antibody panel in 12 weeks.  He saw Dr. Curt Bears on October 8 for the evaluation of a TEE and Linq monitor.  I did reach out yesterday to Dr. Curt Bears as it didn't look like these got scheduled.  Patient was in the emergency room the day after I saw him last visit.  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  He continued to work and noticed that he felt confused.  It lasted several minutes.  He went to the emergency room because he thought he was having a stroke.  Work-up was negative.  Routine EEG at our office was also negative.  Patient states that he cut out all his caffeine and his dizziness actually resolved.  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   Current Outpatient Medications on File Prior to Visit  Medication Sig Dispense Refill  . Cyanocobalamin (VITAMIN B-12 PO) Take 1 tablet by mouth every evening.     Marland Kitchen EPINEPHrine 0.3 mg/0.3 mL IJ SOAJ injection USE AS DIRECTED AS NEEDED FOR SEVERE ALLERGIC REACTION    . ezetimibe (ZETIA) 10 MG tablet Take 10 mg by mouth every evening.     . famotidine (PEPCID) 10 MG tablet Take 10 mg by mouth 2 (two) times daily.    Marland Kitchen  pravastatin (PRAVACHOL) 20 MG tablet     . tadalafil (ADCIRCA/CIALIS) 20 MG tablet Take 20 mg by mouth daily as needed for erectile dysfunction.    . valsartan (DIOVAN) 80 MG tablet Take 80 mg by mouth daily.     No current facility-administered medications on file prior to visit.      Observations/Objective:   Vitals:   07/28/19 1423  Weight: 168 lb (76.2 kg)   GEN:  The patient appears stated age and is in NAD.  Neurological examination:  Orientation: The patient is alert and oriented x3. Cranial nerves: There is good facial symmetry. There is no facial  hypomimia.  The speech is fluent and clear. Soft palate rises symmetrically and there is no tongue deviation. Hearing is intact to conversational tone. Motor: Strength is at least antigravity x 4.   Shoulder shrug is equal and symmetric.  There is no pronator drift.  Movement examination: Tone: unable Abnormal movements: mild chorea L arm Coordination:  There is no decremation with RAM' Gait and Station: not tested  Admission on 05/26/2019, Discharged on 05/27/2019  Component Date Value Ref Range Status  . Prothrombin Time 05/26/2019 13.7  11.4 - 15.2 seconds Final  . INR 05/26/2019 1.1  0.8 - 1.2 Final  . aPTT 05/26/2019 44* 24 - 36 seconds Final  . WBC 05/26/2019 7.1  4.0 - 10.5 K/uL Final  . RBC 05/26/2019 4.52  4.22 - 5.81 MIL/uL Final  . Hemoglobin 05/26/2019 14.9  13.0 - 17.0 g/dL Final  . HCT 05/26/2019 42.9  39.0 - 52.0 % Final  . MCV 05/26/2019 94.9  80.0 - 100.0 fL Final  . MCH 05/26/2019 33.0  26.0 - 34.0 pg Final  . MCHC 05/26/2019 34.7  30.0 - 36.0 g/dL Final  . RDW 05/26/2019 12.5  11.5 - 15.5 % Final  . Platelets 05/26/2019 179  150 - 400 K/uL Final  . nRBC 05/26/2019 0.0  0.0 - 0.2 % Final  . Neutrophils Relative % 05/26/2019 65  % Final  . Neutro Abs 05/26/2019 4.6  1.7 - 7.7 K/uL Final  . Lymphocytes Relative 05/26/2019 20  % Final  . Lymphs Abs 05/26/2019 1.4  0.7 - 4.0 K/uL Final  . Monocytes Relative 05/26/2019 12  % Final  . Monocytes Absolute 05/26/2019 0.9  0.1 - 1.0 K/uL Final  . Eosinophils Relative 05/26/2019 3  % Final  . Eosinophils Absolute 05/26/2019 0.2  0.0 - 0.5 K/uL Final  . Basophils Relative 05/26/2019 0  % Final  . Basophils Absolute 05/26/2019 0.0  0.0 - 0.1 K/uL Final  . Immature Granulocytes 05/26/2019 0  % Final  . Abs Immature Granulocytes 05/26/2019 0.02  0.00 - 0.07 K/uL Final  . Sodium 05/26/2019 141  135 - 145 mmol/L Final  . Potassium 05/26/2019 4.4  3.5 - 5.1 mmol/L Final  . Chloride 05/26/2019 101  98 - 111 mmol/L Final  . CO2  05/26/2019 27  22 - 32 mmol/L Final  . Glucose, Bld 05/26/2019 94  70 - 99 mg/dL Final  . BUN 05/26/2019 22  8 - 23 mg/dL Final  . Creatinine, Ser 05/26/2019 1.12  0.61 - 1.24 mg/dL Final  . Calcium 05/26/2019 9.5  8.9 - 10.3 mg/dL Final  . Total Protein 05/26/2019 5.9* 6.5 - 8.1 g/dL Final  . Albumin 05/26/2019 3.8  3.5 - 5.0 g/dL Final  . AST 05/26/2019 22  15 - 41 U/L Final  . ALT 05/26/2019 20  0 - 44 U/L Final  .  Alkaline Phosphatase 05/26/2019 50  38 - 126 U/L Final  . Total Bilirubin 05/26/2019 0.7  0.3 - 1.2 mg/dL Final  . GFR calc non Af Amer 05/26/2019 >60  >60 mL/min Final  . GFR calc Af Amer 05/26/2019 >60  >60 mL/min Final  . Anion gap 05/26/2019 13  5 - 15 Final  . Sodium 05/26/2019 140  135 - 145 mmol/L Final  . Potassium 05/26/2019 4.4  3.5 - 5.1 mmol/L Final  . Chloride 05/26/2019 102  98 - 111 mmol/L Final  . BUN 05/26/2019 25* 8 - 23 mg/dL Final  . Creatinine, Ser 05/26/2019 1.10  0.61 - 1.24 mg/dL Final  . Glucose, Bld 05/26/2019 91  70 - 99 mg/dL Final  . Calcium, Ion 05/26/2019 1.22  1.15 - 1.40 mmol/L Final  . TCO2 05/26/2019 26  22 - 32 mmol/L Final  . Hemoglobin 05/26/2019 14.3  13.0 - 17.0 g/dL Final  . HCT 05/26/2019 42.0  39.0 - 52.0 % Final  Lab on 05/25/2019  Component Date Value Ref Range Status  . Ferritin 05/25/2019 182.8  22.0 - 322.0 ng/mL Final  . INTERPRETATION 05/25/2019 see note   Final  . Beta-2 Glyco I IgG 05/25/2019 <9  <=20 SGU Final  . Beta-2 Glyco 1 IgA 05/25/2019 17  <=20 SAU Final  . Beta-2 Glyco 1 IgM 05/25/2019 >150* <=20 SMU Final  . PHOSPHATIDYLSERINE AB (IGA) 05/25/2019 <20  <20 U/mL Final  . PHOSPHATIDYLSERINE AB  (IGG) 05/25/2019 <10  <10 U/mL Final  . PHOSPHATIDYLSERINE AB  (IGM) 05/25/2019 >100* <25 U/mL Final  . Anticardiolipin IgA 05/25/2019 <11  <=11 APL Final  . Anticardiolipin IgG 05/25/2019 <14  <=14 GPL Final  . Anticardiolipin IgM 05/25/2019 102* <=12 MPL Final  . PTT Lupus Anticoagulant 05/25/2019 68.0* 0.0 - 51.9  sec Final  . Dilute Viper Venom Time 05/25/2019 106.0* 0.0 - 47.0 sec Final  . Lupus Anticoag Interp 05/25/2019 Comment:   Final  . Gliadin IgA 05/25/2019 5  Units Final  . Gliadin IgG 05/25/2019 2  Units Final  . Vitamin B-12 05/25/2019 609  211 - 911 pg/mL Final  . RPR Ser Ql 05/25/2019 REACTIVE* NON-REACTI Final  . Sed Rate 05/25/2019 8  0 - 20 mm/hr Final  . PTH 05/25/2019 6* 14 - 64 pg/mL Final  . Calcium 05/25/2019 9.1  8.6 - 10.3 mg/dL Final  . Copper 05/25/2019 80  70 - 175 mcg/dL Final  . Ceruloplasmin 05/25/2019 26  18 - 36 mg/dL Final  . RPR Titer 05/25/2019 1:1*  Final  . Fluorescent Treponemal ABS 05/25/2019 NON-REACTIVE  NON-REACTI Final  . PTT-LA Mix 05/25/2019 61.4* 0.0 - 48.9 sec Final  . dRVVT Mix 05/25/2019 72.3* 0.0 - 47.0 sec Final  . Hexagonal Phase Phospholipid 05/25/2019 46* 0 - 11 sec Final  . dRVVT Confirm 05/25/2019 2.6* 0.8 - 1.2 ratio Final  Hospital Outpatient Visit on 05/22/2019  Component Date Value Ref Range Status  . 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  (Last one without)  -Cardiology to schedule TEE and Linq - told pt to call cardiology and number given.  I spoke with Dr. Curt Bears yesterday  -Patient following with hematology for possible antiphospholipid antibody syndrome.  Has an appointment in January  for repeat labs and in February with the hematologist.  -discussed anticoagulation with the patient.  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  Being on anticoag would affect hematology w/u.  I am going to email stroke neuro as well and get input because pt hasn't had TEE or LINQ yet either.  Follow Up Instructions:  6 months unless new issues arise.  -I discussed the assessment and treatment plan with the patient.  The patient was provided an opportunity to ask questions and all were answered. The patient agreed with the plan and demonstrated an understanding of the instructions.   The patient was advised to call back or seek an in-person evaluation if the symptoms worsen or if the condition fails to improve as anticipated.    Total Time spent in visit with the patient was:  25 min, of which more than 50% of the time was spent in counseling.   Pt understands and agrees with the plan of care outlined.  This did not include the 35 min of record review which was detailed above, which was non face to face time.    Jordan Bogus, DO

## 2019-07-28 NOTE — H&P (View-Only) (Signed)
Virtual Visit via Video Note The purpose of this virtual visit is to provide medical care while limiting exposure to the novel coronavirus.    Consent was obtained for video visit:  Yes.   Answered questions that patient had about telehealth interaction:  Yes.   I discussed the limitations, risks, security and privacy concerns of performing an evaluation and management service by telemedicine. I also discussed with the patient that there may be a patient responsible charge related to this service. The patient expressed understanding and agreed to proceed.  Pt location: Home Physician Location: office Name of referring provider:  Crist Infante, MD I connected with Jordan [name] at patients initiation/request on 07/29/2019 at 10:15 AM EST by video enabled telemedicine application and verified that I am speaking with the correct person using two identifiers. 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 Numerous records have been reviewed which took 35 min of non face to face time.   Patient had repeat MRI of the brain the day after I saw him, but this was done without contrast.  This was nonacute.  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  There was an old infarct at the gray-white junction on the left.  CT abdomen and pelvis with contrast were done on October 15 and there was no evidence of metastasis or primary malignancy.  There were several small lesions in the liver that were too small to characterize but appeared stable from comparison examinations and felt benign.  CT of the chest was done both October 15 and July 24, 2019 and was unremarkable.  Paraneoplastic antibody panel was completed from [facility] and was negative.  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  He saw hematology on October 30.  I reviewed the records.  He is going to repeat his antiphospholipid antibody panel in 12 weeks.  He saw Dr. Curt Bears on October 8 for the evaluation of a TEE and Linq monitor.  I did reach out yesterday to Dr. Curt Bears as it didn't look like these got scheduled.  Patient was in the emergency room the day after I saw him last visit.  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  He continued to work and noticed that he felt confused.  It lasted several minutes.  He went to the emergency room because he thought he was having a stroke.  Work-up was negative.  Routine EEG at our office was also negative.  Patient states that he cut out all his caffeine and his dizziness actually resolved.  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   Current Outpatient Medications on File Prior to Visit  Medication Sig Dispense Refill  . Cyanocobalamin (VITAMIN B-12 PO) Take 1 tablet by mouth every evening.     Marland Kitchen EPINEPHrine 0.3 mg/0.3 mL IJ SOAJ injection USE AS DIRECTED AS NEEDED FOR SEVERE ALLERGIC REACTION    . ezetimibe (ZETIA) 10 MG tablet Take 10 mg by mouth every evening.     . famotidine (PEPCID) 10 MG tablet Take 10 mg by mouth 2 (two) times daily.    Marland Kitchen  pravastatin (PRAVACHOL) 20 MG tablet     . tadalafil (ADCIRCA/CIALIS) 20 MG tablet Take 20 mg by mouth daily as needed for erectile dysfunction.    . valsartan (DIOVAN) 80 MG tablet Take 80 mg by mouth daily.     No current facility-administered medications on file prior to visit.      Observations/Objective:   Vitals:   07/28/19 1423  Weight: 168 lb (76.2 kg)   GEN:  The patient appears stated age and is in NAD.  Neurological examination:  Orientation: The patient is alert and oriented x3. Cranial nerves: There is good facial symmetry. There is no facial  hypomimia.  The speech is fluent and clear. Soft palate rises symmetrically and there is no tongue deviation. Hearing is intact to conversational tone. Motor: Strength is at least antigravity x 4.   Shoulder shrug is equal and symmetric.  There is no pronator drift.  Movement examination: Tone: unable Abnormal movements: mild chorea L arm Coordination:  There is no decremation with RAM' Gait and Station: not tested  Admission on 05/26/2019, Discharged on 05/27/2019  Component Date Value Ref Range Status  . Prothrombin Time 05/26/2019 13.7  11.4 - 15.2 seconds Final  . INR 05/26/2019 1.1  0.8 - 1.2 Final  . aPTT 05/26/2019 44* 24 - 36 seconds Final  . WBC 05/26/2019 7.1  4.0 - 10.5 K/uL Final  . RBC 05/26/2019 4.52  4.22 - 5.81 MIL/uL Final  . Hemoglobin 05/26/2019 14.9  13.0 - 17.0 g/dL Final  . HCT 05/26/2019 42.9  39.0 - 52.0 % Final  . MCV 05/26/2019 94.9  80.0 - 100.0 fL Final  . MCH 05/26/2019 33.0  26.0 - 34.0 pg Final  . MCHC 05/26/2019 34.7  30.0 - 36.0 g/dL Final  . RDW 05/26/2019 12.5  11.5 - 15.5 % Final  . Platelets 05/26/2019 179  150 - 400 K/uL Final  . nRBC 05/26/2019 0.0  0.0 - 0.2 % Final  . Neutrophils Relative % 05/26/2019 65  % Final  . Neutro Abs 05/26/2019 4.6  1.7 - 7.7 K/uL Final  . Lymphocytes Relative 05/26/2019 20  % Final  . Lymphs Abs 05/26/2019 1.4  0.7 - 4.0 K/uL Final  . Monocytes Relative 05/26/2019 12  % Final  . Monocytes Absolute 05/26/2019 0.9  0.1 - 1.0 K/uL Final  . Eosinophils Relative 05/26/2019 3  % Final  . Eosinophils Absolute 05/26/2019 0.2  0.0 - 0.5 K/uL Final  . Basophils Relative 05/26/2019 0  % Final  . Basophils Absolute 05/26/2019 0.0  0.0 - 0.1 K/uL Final  . Immature Granulocytes 05/26/2019 0  % Final  . Abs Immature Granulocytes 05/26/2019 0.02  0.00 - 0.07 K/uL Final  . Sodium 05/26/2019 141  135 - 145 mmol/L Final  . Potassium 05/26/2019 4.4  3.5 - 5.1 mmol/L Final  . Chloride 05/26/2019 101  98 - 111 mmol/L Final  . CO2  05/26/2019 27  22 - 32 mmol/L Final  . Glucose, Bld 05/26/2019 94  70 - 99 mg/dL Final  . BUN 05/26/2019 22  8 - 23 mg/dL Final  . Creatinine, Ser 05/26/2019 1.12  0.61 - 1.24 mg/dL Final  . Calcium 05/26/2019 9.5  8.9 - 10.3 mg/dL Final  . Total Protein 05/26/2019 5.9* 6.5 - 8.1 g/dL Final  . Albumin 05/26/2019 3.8  3.5 - 5.0 g/dL Final  . AST 05/26/2019 22  15 - 41 U/L Final  . ALT 05/26/2019 20  0 - 44 U/L Final  .  Alkaline Phosphatase 05/26/2019 50  38 - 126 U/L Final  . Total Bilirubin 05/26/2019 0.7  0.3 - 1.2 mg/dL Final  . GFR calc non Af Amer 05/26/2019 >60  >60 mL/min Final  . GFR calc Af Amer 05/26/2019 >60  >60 mL/min Final  . Anion gap 05/26/2019 13  5 - 15 Final  . Sodium 05/26/2019 140  135 - 145 mmol/L Final  . Potassium 05/26/2019 4.4  3.5 - 5.1 mmol/L Final  . Chloride 05/26/2019 102  98 - 111 mmol/L Final  . BUN 05/26/2019 25* 8 - 23 mg/dL Final  . Creatinine, Ser 05/26/2019 1.10  0.61 - 1.24 mg/dL Final  . Glucose, Bld 05/26/2019 91  70 - 99 mg/dL Final  . Calcium, Ion 05/26/2019 1.22  1.15 - 1.40 mmol/L Final  . TCO2 05/26/2019 26  22 - 32 mmol/L Final  . Hemoglobin 05/26/2019 14.3  13.0 - 17.0 g/dL Final  . HCT 05/26/2019 42.0  39.0 - 52.0 % Final  Lab on 05/25/2019  Component Date Value Ref Range Status  . Ferritin 05/25/2019 182.8  22.0 - 322.0 ng/mL Final  . INTERPRETATION 05/25/2019 see note   Final  . Beta-2 Glyco I IgG 05/25/2019 <9  <=20 SGU Final  . Beta-2 Glyco 1 IgA 05/25/2019 17  <=20 SAU Final  . Beta-2 Glyco 1 IgM 05/25/2019 >150* <=20 SMU Final  . PHOSPHATIDYLSERINE AB (IGA) 05/25/2019 <20  <20 U/mL Final  . PHOSPHATIDYLSERINE AB  (IGG) 05/25/2019 <10  <10 U/mL Final  . PHOSPHATIDYLSERINE AB  (IGM) 05/25/2019 >100* <25 U/mL Final  . Anticardiolipin IgA 05/25/2019 <11  <=11 APL Final  . Anticardiolipin IgG 05/25/2019 <14  <=14 GPL Final  . Anticardiolipin IgM 05/25/2019 102* <=12 MPL Final  . PTT Lupus Anticoagulant 05/25/2019 68.0* 0.0 - 51.9  sec Final  . Dilute Viper Venom Time 05/25/2019 106.0* 0.0 - 47.0 sec Final  . Lupus Anticoag Interp 05/25/2019 Comment:   Final  . Gliadin IgA 05/25/2019 5  Units Final  . Gliadin IgG 05/25/2019 2  Units Final  . Vitamin B-12 05/25/2019 609  211 - 911 pg/mL Final  . RPR Ser Ql 05/25/2019 REACTIVE* NON-REACTI Final  . Sed Rate 05/25/2019 8  0 - 20 mm/hr Final  . PTH 05/25/2019 6* 14 - 64 pg/mL Final  . Calcium 05/25/2019 9.1  8.6 - 10.3 mg/dL Final  . Copper 05/25/2019 80  70 - 175 mcg/dL Final  . Ceruloplasmin 05/25/2019 26  18 - 36 mg/dL Final  . RPR Titer 05/25/2019 1:1*  Final  . Fluorescent Treponemal ABS 05/25/2019 NON-REACTIVE  NON-REACTI Final  . PTT-LA Mix 05/25/2019 61.4* 0.0 - 48.9 sec Final  . dRVVT Mix 05/25/2019 72.3* 0.0 - 47.0 sec Final  . Hexagonal Phase Phospholipid 05/25/2019 46* 0 - 11 sec Final  . dRVVT Confirm 05/25/2019 2.6* 0.8 - 1.2 ratio Final  Hospital Outpatient Visit on 05/22/2019  Component Date Value Ref Range Status  . 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  (Last one without)  -Cardiology to schedule TEE and Linq - told pt to call cardiology and number given.  I spoke with Dr. Curt Bears yesterday  -Patient following with hematology for possible antiphospholipid antibody syndrome.  Has an appointment in January  for repeat labs and in February with the hematologist.  -discussed anticoagulation with the patient.  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  Being on anticoag would affect hematology w/u.  I am going to email stroke neuro as well and get input because pt hasn't had TEE or LINQ yet either.  Follow Up Instructions:  6 months unless new issues arise.  -I discussed the assessment and treatment plan with the patient.  The patient was provided an opportunity to ask questions and all were answered. The patient agreed with the plan and demonstrated an understanding of the instructions.   The patient was advised to call back or seek an in-person evaluation if the symptoms worsen or if the condition fails to improve as anticipated.    Total Time spent in visit with the patient was:  25 min, of which more than 50% of the time was spent in counseling.   Pt understands and agrees with the plan of care outlined.  This did not include the 35 min of record review which was detailed above, which was non face to face time.    Alonza Bogus, DO

## 2019-07-29 ENCOUNTER — Other Ambulatory Visit: Payer: Self-pay

## 2019-07-29 ENCOUNTER — Telehealth: Payer: Self-pay

## 2019-07-29 ENCOUNTER — Telehealth (INDEPENDENT_AMBULATORY_CARE_PROVIDER_SITE_OTHER): Payer: Medicare Other | Admitting: Neurology

## 2019-07-29 ENCOUNTER — Telehealth: Payer: Self-pay | Admitting: Neurology

## 2019-07-29 VITALS — Wt 168.0 lb

## 2019-07-29 DIAGNOSIS — G255 Other chorea: Secondary | ICD-10-CM

## 2019-07-29 DIAGNOSIS — D6859 Other primary thrombophilia: Secondary | ICD-10-CM | POA: Diagnosis not present

## 2019-07-29 DIAGNOSIS — I63133 Cerebral infarction due to embolism of bilateral carotid arteries: Secondary | ICD-10-CM

## 2019-07-29 DIAGNOSIS — D6861 Antiphospholipid syndrome: Secondary | ICD-10-CM

## 2019-07-29 DIAGNOSIS — R9402 Abnormal brain scan: Secondary | ICD-10-CM

## 2019-07-29 NOTE — Telephone Encounter (Signed)
Left message to call office back

## 2019-07-29 NOTE — Telephone Encounter (Signed)
-----   Message from [place], DO sent at 07/29/2019 10:42 AM EST ----- Please order MRI brain with and without gad.  Dx:  chorea on L and abnormal brain scan - its a follow up scan

## 2019-07-29 NOTE — Telephone Encounter (Signed)
Please let pt know that following our call today, I ran his case by one of our stroke neurologists.  He suggested that we also do a LE venous doppler to make sure no clots there as pts with antiphospholipid ab syndrome can have those as source for stroke.  If pt agreeable, please order.  Dx:  Antiphospholipid ab syndrome and stroke

## 2019-07-30 ENCOUNTER — Telehealth: Payer: Self-pay | Admitting: Cardiology

## 2019-07-30 NOTE — Telephone Encounter (Signed)
New Message:  Patient was calling to schedule his TEE and Loop recorder insertion.

## 2019-07-30 NOTE — Telephone Encounter (Signed)
Ordered and pt agreeable

## 2019-07-31 NOTE — Telephone Encounter (Signed)
Pt scheduled for TEE/ILR implant 12/16. Covid screening scheduled for tomorrow. Covid & procedure instructions sent to pt via Mychart.  Also verbally reviewed procedure instructions.  Office will call to arrange post procedure appts. Patient & son verbalized understanding and agreeable to plan.

## 2019-07-31 NOTE — Telephone Encounter (Signed)
Returned pt call. Discussed possible dates for procedure. Pt aware I will call hospital and call him back w/ date/instructions. Patient verbalized understanding and agreeable to plan.

## 2019-08-01 ENCOUNTER — Other Ambulatory Visit (HOSPITAL_COMMUNITY)
Admission: RE | Admit: 2019-08-01 | Discharge: 2019-08-01 | Disposition: A | Discharge status: Home / Self Care | Payer: Medicare Other | Source: Ambulatory Visit | Attending: Cardiology | Admitting: Cardiology

## 2019-08-01 DIAGNOSIS — Z01812 Encounter for preprocedural laboratory examination: Secondary | ICD-10-CM | POA: Diagnosis present

## 2019-08-01 DIAGNOSIS — Z20828 Contact with and (suspected) exposure to other viral communicable diseases: Secondary | ICD-10-CM | POA: Insufficient documentation

## 2019-08-02 LAB — NOVEL CORONAVIRUS, NAA (HOSP ORDER, SEND-OUT TO REF LAB; TAT 18-24 HRS): SARS-CoV-2, NAA: NOT DETECTED

## 2019-08-02 NOTE — Progress Notes (Signed)
Virtual Visit via Telephone Note  I connected with Jordan [name] on 08/03/19 at  4:00 PM EST by telephone and verified that I am speaking with the correct person using two identifiers.  Location: Patient: Home Provider: [facility] - [id number], [place], [place], [place] [address]   I discussed the limitations, risks, security and privacy concerns of performing an evaluation and management service by telephone and the availability of in person appointments. I also discussed with the patient that there may be a patient responsible charge related to this service. The patient expressed understanding and agreed to proceed.  Patient consented to consult via telephone: Yes People present and their role in pt care: Pt    History of Present Illness:  78 year old male never smoker followed in our office for dyspnea on exertion  Past medical history: Hyperlipidemia, hypertension, GERD, status post CABG x 4 Smoking history: Never smoker Maintenance: None Patient of Dr. Chase Caller  Chief complaint: 9 month follow up   78 year old male never smoker followed in our office for dyspnea on exertion as well as mediastinal adenopathy.  Patient reports stable breathing since last being seen in our office in March/2020.  Patient reports that he does 210 push-ups weekly.  He works on a farm.  He also hikes regularly.  He recently hiked extensively over a day trip with friends and did well without any dyspnea.  He continues to have follow-up with neurology, cardiology, and primary care.  Overall he feels that he is doing well.  October/2020 as well as December/2020 CT chest are both stable showing no acute pulmonary findings or mediastinal adenopathy.  Observations/Objective:  06/04/2019-CT chest with contrast-no suspicious nodularity, linear atelectasis in lung bases, no axillary supraclavicular adenopathy, no mediastinal hilar adenopathy, no pericardial effusion, no evidence of  thoracic metastasis  07/24/2019-CT chest with contrast-no active cardiopulmonary abnormalities, no suspicious pulmonary nodule identified, aortic arthrosclerosis  05/21/2019-echocardiogram-LV ejection fraction 60 to 65%  10/21/2018-pulmonary function test-FVC 3.37 (86% predicted), ratio 71, FEV1 2.39 (85% predicted), DLCO 23 (97% predicted)  Social History   Tobacco Use  Smoking Status Never Smoker  Smokeless Tobacco Never Used   Immunization History  Administered Date(s) Administered  . Influenza, High Dose Seasonal PF 05/20/2016, 05/20/2018  . Zoster Recombinat (Shingrix) 05/17/2017, 09/05/2017    Assessment and Plan:  Dyspnea on exertion Plan: Doing well follow-up with our office in 1 year  Mediastinal adenopathy Reviewed recent CT with patient No further imaging needed Follow-up with our office in 1 year    Follow Up Instructions:  Return in about 1 year (around 08/02/2020), or if symptoms worsen or fail to improve, for Follow up with Dr. Purnell Shoemaker.   I discussed the assessment and treatment plan with the patient. The patient was provided an opportunity to ask questions and all were answered. The patient agreed with the plan and demonstrated an understanding of the instructions.   The patient was advised to call back or seek an in-person evaluation if the symptoms worsen or if the condition fails to improve as anticipated.  I provided 15 minutes of non-face-to-face time during this encounter.   Lauraine Rinne, NP

## 2019-08-03 ENCOUNTER — Ambulatory Visit (INDEPENDENT_AMBULATORY_CARE_PROVIDER_SITE_OTHER): Payer: Medicare Other | Admitting: Pulmonary Disease

## 2019-08-03 ENCOUNTER — Other Ambulatory Visit: Payer: Self-pay

## 2019-08-03 ENCOUNTER — Encounter: Payer: Self-pay | Admitting: Pulmonary Disease

## 2019-08-03 DIAGNOSIS — R0600 Dyspnea, unspecified: Secondary | ICD-10-CM

## 2019-08-03 DIAGNOSIS — R590 Localized enlarged lymph nodes: Secondary | ICD-10-CM

## 2019-08-03 DIAGNOSIS — R0609 Other forms of dyspnea: Secondary | ICD-10-CM

## 2019-08-03 NOTE — Patient Instructions (Addendum)
You were seen today by Lauraine Rinne, NP  for:   1. Mediastinal adenopathy  Stable recent CT No further CT imaging of chest needed at this time  Contact our office if you have any acute or worsening symptoms or additional questions  2. Dyspnea on exertion  Keep up the hard work with physical activity Notify our office if you have any acute or worsening symptoms   Follow Up:    Return in about 1 year (around 08/02/2020), or if symptoms worsen or fail to improve, for Follow up with Dr. Purnell Shoemaker.   Please do your part to reduce the spread of COVID-19:      Reduce your risk of any infection  and COVID19 by using the similar precautions used for avoiding the common cold or flu:  Marland Kitchen Wash your hands often with soap and warm water for at least 20 seconds.  If soap and water are not readily available, use an alcohol-based hand sanitizer with at least 60% alcohol.  . If coughing or sneezing, cover your mouth and nose by coughing or sneezing into the elbow areas of your shirt or coat, into a tissue or into your sleeve (not your hands). Langley Gauss A MASK when in public  . Avoid shaking hands with others and consider head nods or verbal greetings only. . Avoid touching your eyes, nose, or mouth with unwashed hands.  . Avoid close contact with people who are sick. . Avoid places or events with large numbers of people in one location, like concerts or sporting events. . If you have some symptoms but not all symptoms, continue to monitor at home and seek medical attention if your symptoms worsen. . If you are having a medical emergency, call 911.   [place] / e-Visit: [email or web address]         [facility]: [facility]: [id number]                   [facility]: [id number]     It is flu season:   >>> Best ways to protect herself  from the flu: Receive the yearly flu vaccine, practice good hand hygiene washing with soap and also using hand sanitizer when available, eat a nutritious meals, get adequate rest, hydrate appropriately   Please contact the office if your symptoms worsen or you have concerns that you are not improving.   Thank you for choosing [facility] for your healthcare, and for allowing [place] to partner with you on your healthcare journey. I am thankful to be able to provide care to you today.   Wyn Quaker FNP-C

## 2019-08-03 NOTE — Assessment & Plan Note (Signed)
Reviewed recent CT with patient No further imaging needed Follow-up with our office in 1 year

## 2019-08-03 NOTE — Assessment & Plan Note (Signed)
Plan: Doing well follow-up with our office in 1 year

## 2019-08-04 ENCOUNTER — Ambulatory Visit (HOSPITAL_COMMUNITY)
Admission: RE | Admit: 2019-08-04 | Discharge: 2019-08-04 | Disposition: A | Discharge status: Home / Self Care | Payer: Medicare Other | Source: Ambulatory Visit | Attending: Neurology | Admitting: Neurology

## 2019-08-04 ENCOUNTER — Other Ambulatory Visit: Payer: Self-pay

## 2019-08-04 DIAGNOSIS — D6861 Antiphospholipid syndrome: Secondary | ICD-10-CM | POA: Diagnosis not present

## 2019-08-04 NOTE — Progress Notes (Signed)
Bilateral lower extremity venous duplex completed. Refer to "CV Proc" under chart review to view preliminary results.  08/04/2019 2:42 PM Kelby Aline., MHA, RVT, RDCS, RDMS

## 2019-08-05 ENCOUNTER — Ambulatory Visit (HOSPITAL_COMMUNITY)
Admission: RE | Admit: 2019-08-05 | Discharge: 2019-08-05 | Disposition: A | Discharge status: Home / Self Care | Payer: Medicare Other | Attending: Cardiology | Admitting: Cardiology

## 2019-08-05 ENCOUNTER — Ambulatory Visit (HOSPITAL_BASED_OUTPATIENT_CLINIC_OR_DEPARTMENT_OTHER): Payer: Medicare Other

## 2019-08-05 ENCOUNTER — Encounter (HOSPITAL_COMMUNITY)
Admission: RE | Disposition: A | Discharge status: Home / Self Care | Payer: Self-pay | Source: Home / Self Care | Attending: Cardiology

## 2019-08-05 ENCOUNTER — Other Ambulatory Visit: Payer: Self-pay

## 2019-08-05 ENCOUNTER — Encounter (HOSPITAL_COMMUNITY): Payer: Self-pay | Admitting: Cardiology

## 2019-08-05 DIAGNOSIS — I10 Essential (primary) hypertension: Secondary | ICD-10-CM | POA: Insufficient documentation

## 2019-08-05 DIAGNOSIS — K219 Gastro-esophageal reflux disease without esophagitis: Secondary | ICD-10-CM | POA: Insufficient documentation

## 2019-08-05 DIAGNOSIS — Z8249 Family history of ischemic heart disease and other diseases of the circulatory system: Secondary | ICD-10-CM | POA: Insufficient documentation

## 2019-08-05 DIAGNOSIS — I2510 Atherosclerotic heart disease of native coronary artery without angina pectoris: Secondary | ICD-10-CM | POA: Diagnosis not present

## 2019-08-05 DIAGNOSIS — I6389 Other cerebral infarction: Secondary | ICD-10-CM | POA: Diagnosis not present

## 2019-08-05 DIAGNOSIS — H8109 Meniere's disease, unspecified ear: Secondary | ICD-10-CM | POA: Diagnosis not present

## 2019-08-05 DIAGNOSIS — E785 Hyperlipidemia, unspecified: Secondary | ICD-10-CM | POA: Diagnosis not present

## 2019-08-05 DIAGNOSIS — Z888 Allergy status to other drugs, medicaments and biological substances status: Secondary | ICD-10-CM | POA: Diagnosis not present

## 2019-08-05 DIAGNOSIS — Z951 Presence of aortocoronary bypass graft: Secondary | ICD-10-CM | POA: Insufficient documentation

## 2019-08-05 DIAGNOSIS — I639 Cerebral infarction, unspecified: Secondary | ICD-10-CM | POA: Diagnosis not present

## 2019-08-05 HISTORY — PX: LOOP RECORDER INSERTION: EP1214

## 2019-08-05 HISTORY — PX: TEE WITHOUT CARDIOVERSION: SHX5443

## 2019-08-05 HISTORY — PX: BUBBLE STUDY: SHX6837

## 2019-08-05 SURGERY — ECHOCARDIOGRAM, TRANSESOPHAGEAL
Anesthesia: Moderate Sedation

## 2019-08-05 SURGERY — LOOP RECORDER INSERTION

## 2019-08-05 MED ORDER — SODIUM CHLORIDE 0.9 % IV SOLN: INTRAVENOUS | Status: DC

## 2019-08-05 MED ORDER — MIDAZOLAM HCL (PF) 10 MG/2ML IJ SOLN
INTRAMUSCULAR | Status: DC | PRN
  Administered 2019-08-05: 1 mg via INTRAVENOUS
  Administered 2019-08-05: 2 mg via INTRAVENOUS
  Administered 2019-08-05: 1 mg via INTRAVENOUS

## 2019-08-05 MED ORDER — LIDOCAINE-EPINEPHRINE 1 %-1:100000 IJ SOLN
INTRAMUSCULAR | Status: DC | PRN
  Administered 2019-08-05: 20 mL

## 2019-08-05 MED ORDER — MIDAZOLAM HCL (PF) 5 MG/ML IJ SOLN
INTRAMUSCULAR | Status: AC
  Filled 2019-08-05: qty 1

## 2019-08-05 MED ORDER — LIDOCAINE VISCOUS HCL 2 % MT SOLN
OROMUCOSAL | Status: AC
  Filled 2019-08-05: qty 15

## 2019-08-05 MED ORDER — LIDOCAINE-EPINEPHRINE 1 %-1:100000 IJ SOLN
INTRAMUSCULAR | Status: AC
  Filled 2019-08-05: qty 1

## 2019-08-05 MED ORDER — FENTANYL CITRATE (PF) 100 MCG/2ML IJ SOLN
INTRAMUSCULAR | Status: AC
  Filled 2019-08-05: qty 2

## 2019-08-05 MED ORDER — LIDOCAINE VISCOUS HCL 2 % MT SOLN
OROMUCOSAL | Status: DC | PRN
  Administered 2019-08-05: 20 mL via OROMUCOSAL

## 2019-08-05 MED ORDER — FENTANYL CITRATE (PF) 100 MCG/2ML IJ SOLN
INTRAMUSCULAR | Status: DC | PRN
  Administered 2019-08-05 (×2): 25 ug via INTRAVENOUS

## 2019-08-05 SURGICAL SUPPLY — 2 items
MONITOR REVEAL LINQ II (Prosthesis & Implant Heart) ×1 IMPLANT
PACK LOOP INSERTION (CUSTOM PROCEDURE TRAY) ×2 IMPLANT

## 2019-08-05 NOTE — Progress Notes (Signed)
  Echocardiogram Echocardiogram Transesophageal has been performed.  Bobbye Charleston 08/05/2019, 10:56 AM

## 2019-08-05 NOTE — CV Procedure (Signed)
    TRANSESOPHAGEAL ECHOCARDIOGRAM   NAME:  Jordan [name]   MRN: [id number] DOB:  March 05, 1941   ADMIT DATE: 08/05/2019  INDICATIONS: Cryptogenic stroke  PROCEDURE:   Informed consent was obtained prior to the procedure. The risks, benefits and alternatives for the procedure were discussed and the patient comprehended these risks.  Risks include, but are not limited to, cough, sore throat, vomiting, nausea, somnolence, esophageal and stomach trauma or perforation, bleeding, low blood pressure, aspiration, pneumonia, infection, trauma to the teeth and death.    Procedural time out performed. The oropharynx was anesthetized with topical 1% viscous lidocaine.    During this procedure the patient is administered a total of Versed 4 mg and Fentanyl 50 mcg to achieve and maintain moderate conscious sedation.  The patient's heart rate, blood pressure, and oxygen saturation are monitored continuously during the procedure. The period of conscious sedation is 21 minutes, of which I was present face-to-face 100% of this time.   The transesophageal probe was inserted in the esophagus and stomach without difficulty and multiple views were obtained.    COMPLICATIONS:    There were no immediate complications.  FINDINGS:  LEFT VENTRICLE: EF = 60-65%. No regional wall motion abnormalities.  RIGHT VENTRICLE: Normal size and function.   LEFT ATRIUM: No thrombus/mass.  LEFT ATRIAL APPENDAGE: No thrombus/mass.   RIGHT ATRIUM: No thrombus/mass.  AORTIC VALVE:  Trileaflet. None regurgitation. No vegetation.  MITRAL VALVE:    Normal structure. Trivial regurgitation. No vegetation.  TRICUSPID VALVE: Normal structure. Trivial regurgitation. No vegetation.  PULMONIC VALVE: Grossly normal structure. Trivial regurgitation. No apparent vegetation.  INTERATRIAL SEPTUM: There was questionable trace color flow seen along the intra-atrial septum without clear doppler signal in the RA. Bubble study was  performed. There were no bubbles across septum either at rest or with abdominal pressure. Therefore, negative study for intra-atrial shunting or clear PFO.  PERICARDIUM: No effusion noted.  DESCENDING AORTA: Mild-Moderate diffuse plaque seen   CONCLUSION: No cardiac source of embolism found. Negative for intra-atrial shunting or clear PFO.  Buford Dresser, MD, PhD [facility]  [address] [place], [place] [address] [phone]   10:46 AM

## 2019-08-05 NOTE — H&P (Signed)
Electrophysiology Office Note   Date:  08/05/2019   ID:  Jordan [name], DOB Jul 08, 1941, MRN [id number]  PCP:  Crist Infante, MD  Cardiologist: Ellyn Hack Primary Electrophysiologist:  Shereda Graw Meredith Leeds, MD    Chief Complaint: Cryptogenic stroke   History of Present Illness: Jordan [name] is a 78 y.o. male who is being seen today for the evaluation of cryptogenic stroke at the request of No ref. provider found. Presenting today for electrophysiology evaluation.  He has a history significant for hypertension, hyperlipidemia, coronary artery disease status post CABG x4 he also has a history of chorea and is followed by neurology.  He had an MRI which showed acute infarcts.  Echocardiogram shows a normal ejection fraction.  He was referred to discuss Linq monitor.  Today, denies symptoms of palpitations, chest pain, shortness of breath, orthopnea, PND, lower extremity edema, claudication, dizziness, presyncope, syncope, bleeding, or neurologic sequela. The patient is tolerating medications without difficulties. Plan LINQ monitor implant.    Past Medical History:  Diagnosis Date  . Abnormal nuclear stress test 03/05/2017  . Bibasilar crackles 02/26/2017  . Carotid stenosis, asymptomatic 07/27/2011  . DDD (degenerative disc disease)   . Dyspnea   . Dyspnea on exertion 01/28/2017  . ED (erectile dysfunction)   . Essential hypertension 01/29/2017  . GERD (gastroesophageal reflux disease)   . History of hiatal hernia   . History of kidney stones    passed stones - no surgery required  . History of wheezing 02/26/2017  . HTN (hypertension)   . Hyperlipidemia   . Hyperlipidemia with target low density lipoprotein (LDL) cholesterol less than 70 mg/dL 03/31/2014  . Impaired fasting glucose   . Knee pain    bilateral  . Left main coronary artery disease 03/18/2017   Cardiac Cath: 55% dLM, tandem p-mLAD 75% --> referred for CABG.  . Mediastinal adenopathy 02/26/2017  .  Meniere's disease   . Meniere's vertigo   . Migraines    occular - last one 07/2018  . Mild carotid artery disease ([place])   . Ocular migraine   . 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 Cyndia Bent @ [facility]):  LIMA-LAD, SVG-D2, SVG-OM, SVG-PDA (endoscopic SVG harvesting from right leg).  . Shoulder pain   . Wears hearing aid    Past Surgical History:  Procedure Laterality Date  . Cardiac Stress Test  2008   Normal  . CARPAL TUNNEL RELEASE Left 1998  . CARPAL TUNNEL RELEASE Right 2008  . COLONOSCOPY  06/2013   Henrene Pastor - polyps  . CORONARY ARTERY BYPASS GRAFT N/A 04/04/2017   Procedure: CORONARY ARTERY BYPASS GRAFTING x 4  LIMA-LAD, SVG-D2, SVG-OM, SVG-PDA (endoscopic SVG harvesting from right leg);  Surgeon: Gaye Pollack, MD;  Location: [facility];  Service: Open Heart Surgery;  Laterality: N/A;  . EXTRACORPOREAL SHOCK WAVE LITHOTRIPSY Right 10/23/2018   Procedure: EXTRACORPOREAL SHOCK WAVE LITHOTRIPSY (ESWL);  Surgeon: Bjorn Loser, MD;  Location: [facility];  Service: Urology;  Laterality: Right;  . HERNIA REPAIR Left    inguinal  . HIATAL HERNIA REPAIR     no surgery per pt  . KNEE CARTILAGE SURGERY Bilateral   . LEFT HEART CATH AND CORONARY ANGIOGRAPHY N/A 03/18/2017   Procedure: Left Heart Cath and Coronary Angiography;  Surgeon: Leonie Man, MD;  Location: [facility];  Service: Cardiovascular:  55% dLM, p-mLAD 75% - mLAD 75%.  EF 55-65%. Mod non-obstructive RCA disease --> referred for CABG  .  MEDIAL PARTIAL KNEE REPLACEMENT Right 2010   Partial right knee replacement  . NM MYOVIEW LTD  01/2017   INTERMEDIATE RISK: Exercise tolerance was good at 10 minutes, however, fatigue and dyspnea were reporte-d -> hypertensive response to exercise.  29mm Horizontal ST depressions noted during stress in the II, III, aVF, V6, V5 and V4 leads c/w ischemia.   Small siize, moderate severity perfusion defect  mid to distal anterior wall perfusion defect suggestive of ischemia..   . ROTATOR  CUFF REPAIR Left 2008  . SHOULDER ARTHROSCOPY W/ ROTATOR CUFF REPAIR Right 06/15/2016  . TEE WITHOUT CARDIOVERSION N/A 04/04/2017   Procedure: TRANSESOPHAGEAL ECHOCARDIOGRAM (TEE);  Surgeon: Gaye Pollack, MD;  Location: [facility];  Service: Open Heart Surgery;  Laterality: N/A;  . TONSILLECTOMY       Current Facility-Administered Medications  Medication Dose Route Frequency Provider Last Rate Last Admin  . 0.9 %  sodium chloride infusion   Intravenous Continuous Peniel Hass, Ocie Doyne, MD        Allergies:   Nexium [esomeprazole] and Crestor [rosuvastatin calcium]   Social History:  The patient  reports that he has never smoked. He has never used smokeless tobacco. He reports current alcohol use. He reports that he does not use drugs.   Family History:  The patient's family history includes Cancer in his mother; Coronary artery disease in his father; Healthy in his daughter; Heart attack in his father; Heart failure in his mother; Hyperlipidemia in his brother, father, and son; Hypertension in his brother.    ROS:  Please see the history of present illness.   Otherwise, review of systems is positive for none.   All other systems are reviewed and negative.    PHYSICAL EXAM: VS:  BP 122/72   Pulse (!) 56   Temp 97.8 F (36.6 C) (Oral)   Resp 12   SpO2 93%  , BMI There is no height or weight on file to calculate BMI. GEN: Well nourished, well developed, in no acute distress  HEENT: normal  Neck: no JVD, carotid bruits, or masses Cardiac: RRR; no murmurs, rubs, or gallops,no edema  Respiratory:  clear to auscultation bilaterally, normal work of breathing GI: soft, nontender, nondistended, + BS MS: no deformity or atrophy  Skin: warm and dry Neuro:  Strength and sensation are intact Psych: euthymic mood, full affect   Recent Labs: 05/26/2019: ALT 20; BUN 25; Creatinine, Ser 1.10; Hemoglobin 14.3; Platelets 179; Potassium 4.4; Sodium 140    Lipid Panel  No results found for: CHOL,  TRIG, HDL, CHOLHDL, VLDL, LDLCALC, LDLDIRECT   Wt Readings from Last 3 Encounters:  07/28/19 76.2 kg  06/19/19 78.8 kg  05/28/19 78.7 kg      Other studies Reviewed: Additional studies/ records that were reviewed today include: TTE 05/21/19  Review of the above records today demonstrates:   1. Left ventricular ejection fraction, by visual estimation, is 60 to 65%. The left ventricle has normal function. Normal left ventricular size. There is no left ventricular hypertrophy.  2. Left ventricular diastolic Doppler parameters are indeterminate pattern of LV diastolic filling.  3. Global right ventricle has normal systolic function.The right ventricular size is mildly enlarged. No increase in right ventricular wall thickness.  4. Left atrial size was normal.  5. Right atrial size was normal.  6. The mitral valve is normal in structure. Trace mitral valve regurgitation. No evidence of mitral stenosis.  7. The tricuspid valve is normal in structure. Tricuspid valve regurgitation is trivial.  8. The aortic valve is normal in structure. Aortic valve regurgitation was not visualized by color flow Doppler. Mild to moderate aortic valve sclerosis/calcification without any evidence of aortic stenosis.  9. The pulmonic valve was normal in structure. Pulmonic valve regurgitation is not visualized by color flow Doppler. 10. Normal pulmonary artery systolic pressure. 11. The inferior vena cava is normal in size with greater than 50% respiratory variability, suggesting right atrial pressure of 3 mmHg.   ASSESSMENT AND PLAN:  1.  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 Rheanne Cortopassi plan for LINQ implant.  Khy [name] Lamountain has presented today for surgery, with the diagnosis of CVA.  The various methods of treatment have been discussed with the patient and family. After consideration of risks, benefits and other options for treatment, the patient has consented to  Procedure(s): LINQ implant as a surgical  intervention .  Risks include but not limited to bleeding, tamponade, infection, pneumothorax, among others. The patient's history has been reviewed, patient examined, no change in status, stable for surgery.  I have reviewed the patient's chart and labs.  Questions were answered to the patient's satisfaction.    Latrecia Capito Curt Bears, MD 08/05/2019 12:21 PM   2.  Coronary artery disease: Status post CABG.  No current chest pain.  3.  Hypertension: Blood pressure well controlled.  4.  Hyperlipidemia: Continue Zetia per primary cardiology   Signed, Dawon Troop Meredith Leeds, MD  08/05/2019 12:20 PM     [facility] [address] [place] [address] [phone] (office) [phone] (fax)

## 2019-08-05 NOTE — Interval H&P Note (Signed)
History and Physical Interval Note:  08/05/2019 9:51 AM  Jordan [name]  has presented today for surgery, with the diagnosis of STROKE.  The various methods of treatment have been discussed with the patient and family. After consideration of risks, benefits and other options for treatment, the patient has consented to  Procedure(s): TRANSESOPHAGEAL ECHOCARDIOGRAM (TEE) (N/A) as a surgical intervention.  The patient's history has been reviewed, patient examined, no change in status, stable for surgery.  I have reviewed the patient's chart and labs.  Questions were answered to the patient's satisfaction.     Ola Raap Harrell Gave

## 2019-08-05 NOTE — Discharge Instructions (Signed)
Transesophageal Echocardiogram Transesophageal echocardiogram (TEE) is a test that uses sound waves to take pictures of your heart. TEE is done by passing a flexible tube down the esophagus. The esophagus is the tube that carries food from the throat to the stomach. The pictures give detailed images of your heart. This can help your doctor see if there are problems with your heart.  What happens during the procedure?  To lower your risk of infection, your doctors will wash or clean their hands.  An IV will be put into one of your veins.  You will be given a medicine to help you relax (sedative).  A medicine may be sprayed or gargled. This numbs the back of your throat.  Your blood pressure, heart rate, and breathing will be watched.  You may be asked to lay on your left side.  A bite block will be placed in your mouth. This keeps you from biting the tube.  The tip of the TEE probe will be placed into the back of your mouth.  You will be asked to swallow.  Your doctor will take pictures of your heart.  The probe and bite block will be taken out. The procedure may vary among doctors and hospitals.  What happens after the procedure?   Your blood pressure, heart rate, breathing rate, and blood oxygen level will be watched until the medicines you were given have worn off.  When you first wake up, your throat may feel sore and numb. This will get better over time. You will not be allowed to eat or drink until the numbness has gone away.  Do not drive for 24 hours if you were given a medicine to help you relax. Summary  TEE is a test that uses sound waves to take pictures of your heart.  You will be given a medicine to help you relax.  Do not drive for 24 hours if you were given a medicine to help you relax. This information is not intended to replace advice given to you by your health care provider. Make sure you discuss any questions you have with your health care  provider. Document Released: 06/03/2009 Document Revised: 04/25/2018 Document Reviewed: 11/07/2016 Elsevier Patient Education  2020 [employer].

## 2019-08-05 NOTE — Progress Notes (Signed)
Patient fully recovered from Endo procedure, awaiting loop recorder, cath lab reported will be over as soon as they can.

## 2019-08-11 ENCOUNTER — Other Ambulatory Visit: Payer: Self-pay

## 2019-08-11 ENCOUNTER — Ambulatory Visit
Admission: RE | Admit: 2019-08-11 | Discharge: 2019-08-11 | Disposition: A | Discharge status: Home / Self Care | Payer: Medicare Other | Source: Ambulatory Visit | Attending: Neurology | Admitting: Neurology

## 2019-08-11 DIAGNOSIS — R9402 Abnormal brain scan: Secondary | ICD-10-CM

## 2019-08-11 DIAGNOSIS — G255 Other chorea: Secondary | ICD-10-CM

## 2019-08-11 IMAGING — MR MR HEAD WO/W CM
12 of 14 series · 39 of 48 positions shown · IV contrast (multihance)
Comparison: Head MRI [DATE], [DATE], and [DATE]

CLINICAL DATA: Chorea. Abnormal brain MRI.

Creatinine was obtained on site at [HOSPITAL] at [HOSPITAL].
Results: Creatinine 0.9 mg/dL.
EXAM:
MRI HEAD WITHOUT AND WITH CONTRAST
TECHNIQUE: Multiplanar, multiecho pulse sequences of the brain and surrounding
structures were obtained without and with intravenous contrast.
CONTRAST:  16mL MULTIHANCE GADOBENATE DIMEGLUMINE 529 MG/ML IV SOLN

[Series 2: T1 · sagittal · 5.0mm · 0.45mm/px · 1 of 24 slices shown]
[im 1/24]
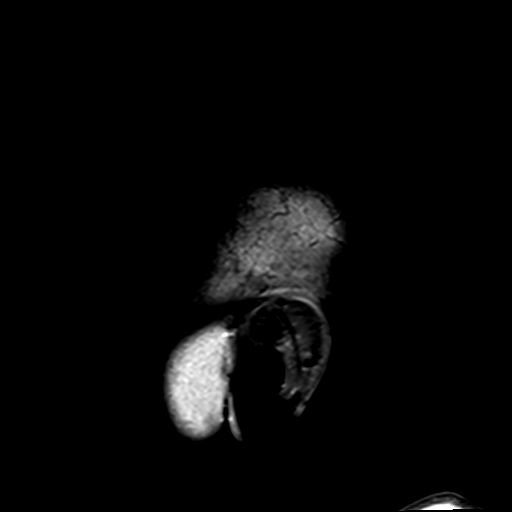

[Series 3: DWI · axial · 3.0mm · 1.88mm/px · z∈[-9,+147]mm · 6 of 110 slices shown (1 of 4)]
[im 1/110]
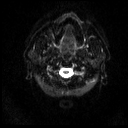
[im 22/110]
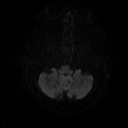
[im 44/110]
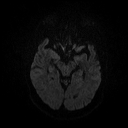
[im 66/110]
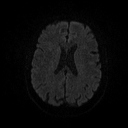
[im 88/110]
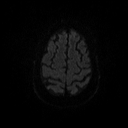
[im 110/110]
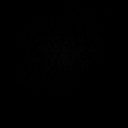

[Series 4: DWI · axial · 3.0mm · 1.88mm/px · z∈[-9,+147]mm · 3 of 50 slices shown (2 of 4)]
[im 1/50]
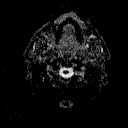
[im 25/50]
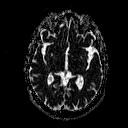
[im 50/50]
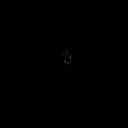

[Series 6: swi_images · axial · 2.0mm · 0.90mm/px · z∈[-7,+145]mm · 4 of 80 slices shown]
[im 1/80]
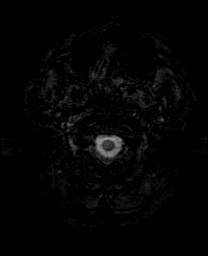
[im 27/80]
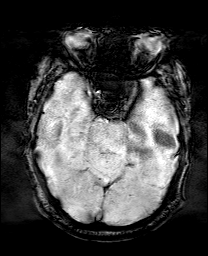
[im 53/80]
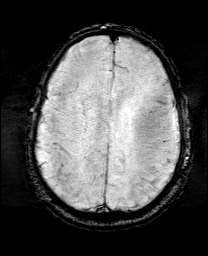
[im 80/80]
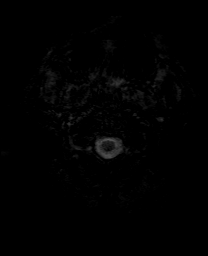

[Series 7: DWI · coronal · 5.0mm · 1.80mm/px · 4 of 72 slices shown (3 of 4)]
[im 1/72]
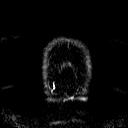
[im 24/72]
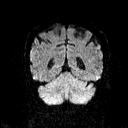
[im 48/72]
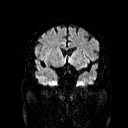
[im 72/72]
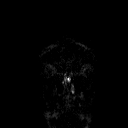

[Series 8: DWI · coronal · 5.0mm · 1.80mm/px · 2 of 36 slices shown (4 of 4)]
[im 1/36]
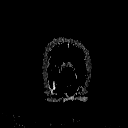
[im 36/36]
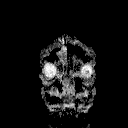

[Series 9: T2 · axial · 5.0mm · 0.62mm/px · 1 of 25 slices shown (1 of 2)]
[im 1/25]
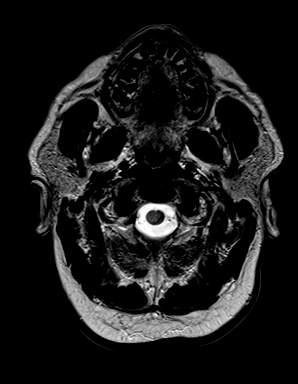

[Series 10: FLAIR · axial · 3.0mm · 0.45mm/px · z∈[-5,+145]mm · 2 of 27 slices shown (1 of 2)]
[im 1/27]
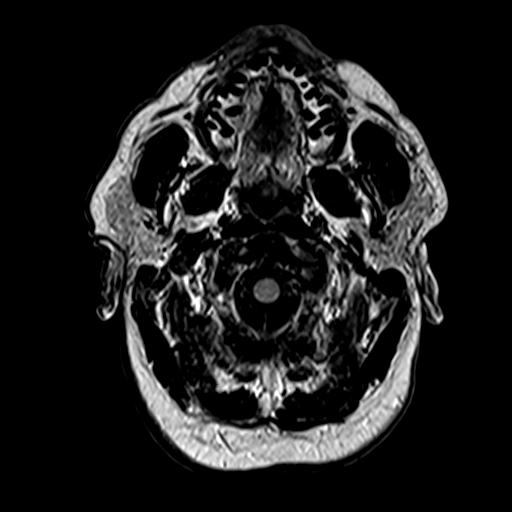
[im 27/27]
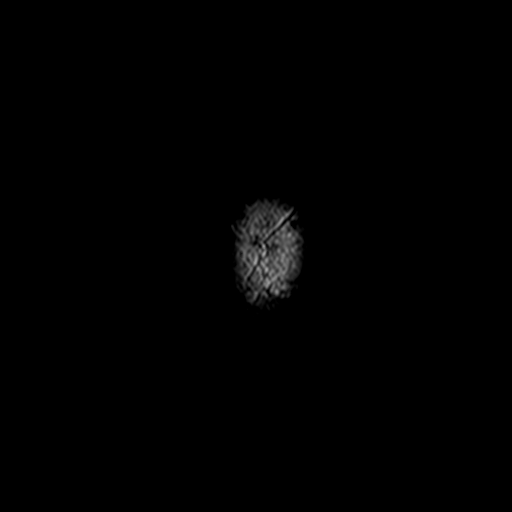

[Series 11: t1_mpr_tra copy center · axial · 1.0mm · 0.45mm/px · z∈[-6,+147]mm · 8 of 160 slices shown]
[im 1/160]
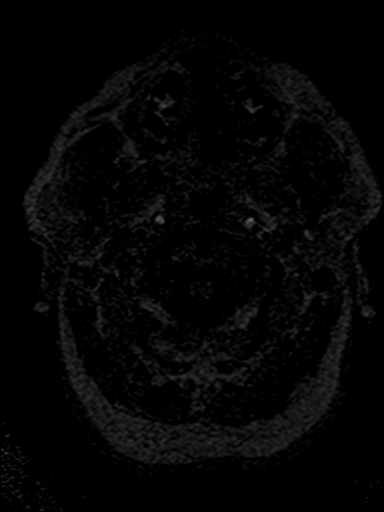
[im 20/160]
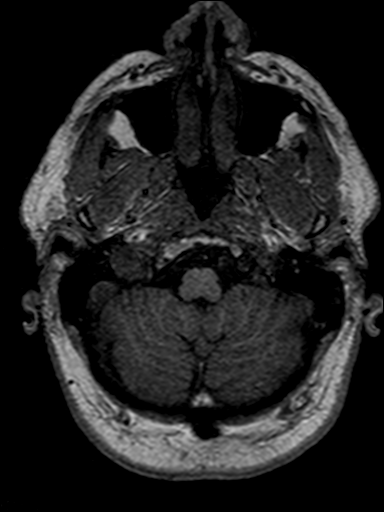
[im 40/160]
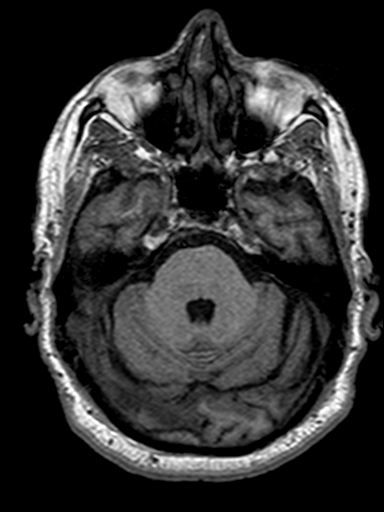
[im 60/160]
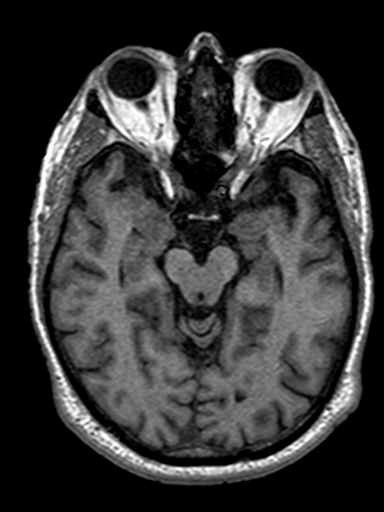
[im 100/160]
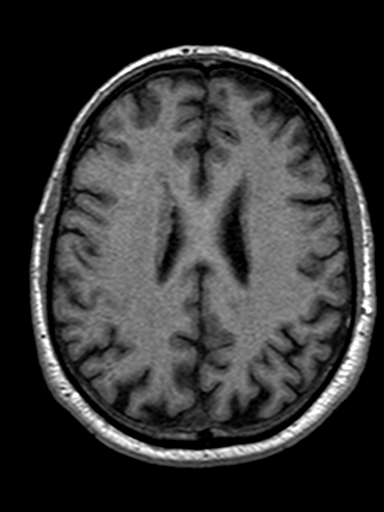
[im 120/160]
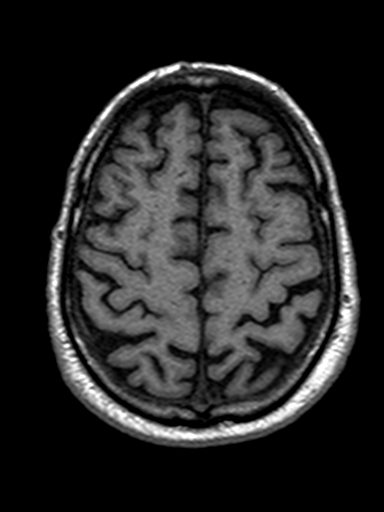
[im 140/160]
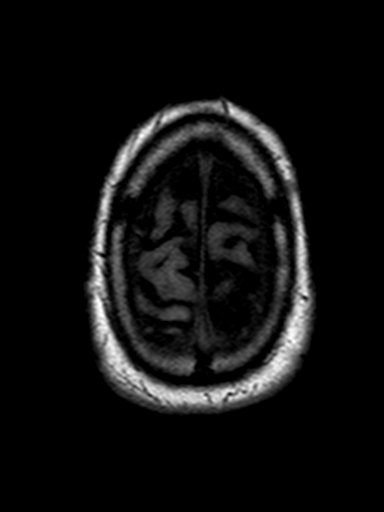
[im 160/160]
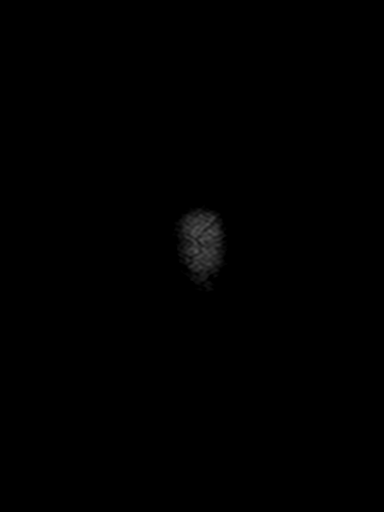

[Series 12: FLAIR · sagittal · 5.0mm · 0.45mm/px · 2 of 30 slices shown (2 of 2)]
[im 1/30]
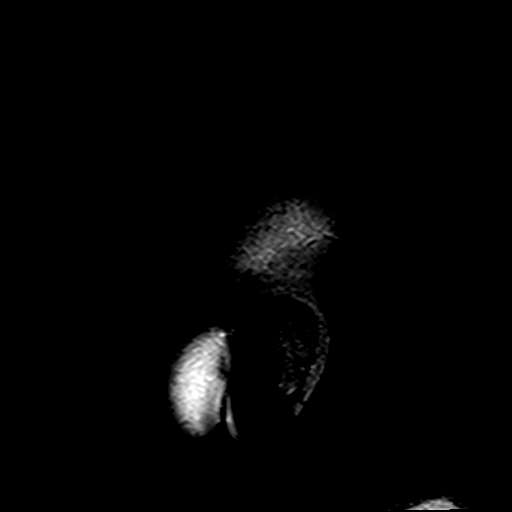
[im 30/30]
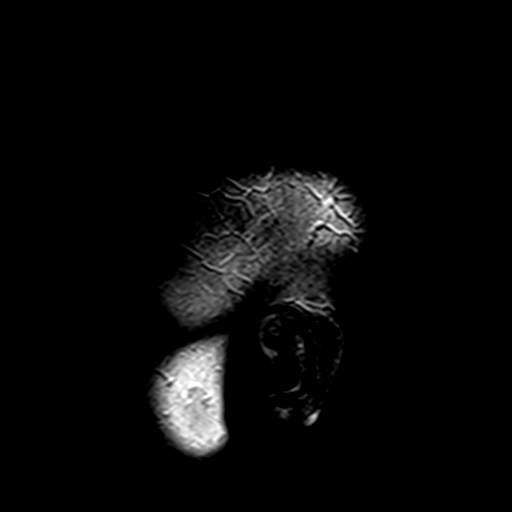

[Series 13: T2 · coronal · 5.0mm · 0.45mm/px · 2 of 29 slices shown (2 of 2)]
[im 1/29]
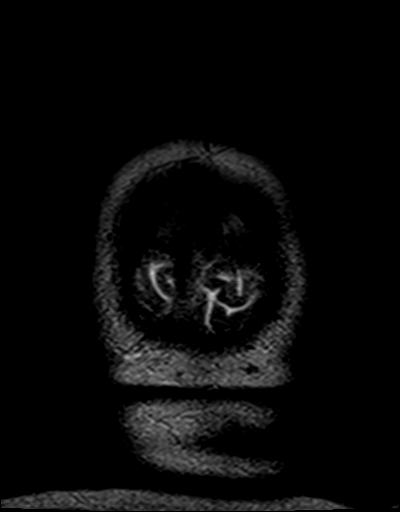
[im 29/29]
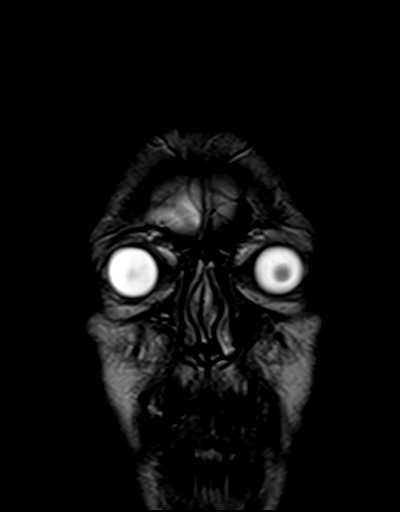

[Series 14: t1_mpr_tra · axial · 1.0mm · 0.45mm/px · z∈[-6,+51]mm · 4 of 160 slices shown]
[im 1/160]
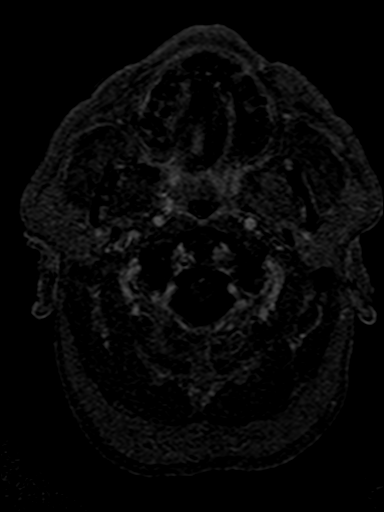
[im 20/160]
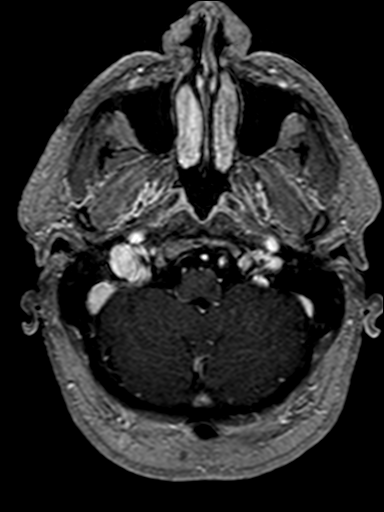
[im 40/160]
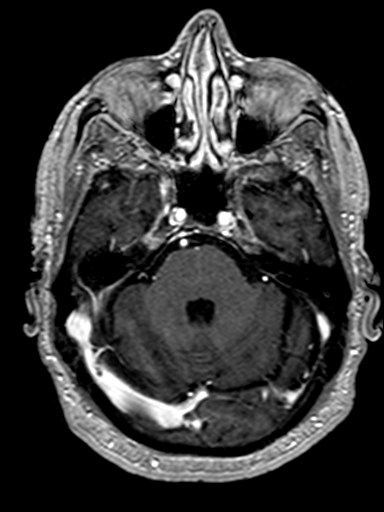
[im 60/160]
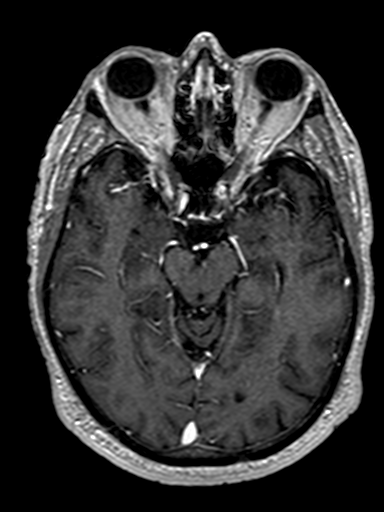

[39 of 48 positions shown; findings below may reference images not displayed]

FINDINGS: Brain: The punctate foci of abnormal diffusion in both frontal lobes
and right parieto-occipital region on the [DATE] study had
resolved on the [DATE] study, and no chronic signal abnormality
is apparent in these locations today. The associated right
parieto-occipital enhancement on the [DATE] study has resolved.

A few small foci of T2 hyperintensity in the cerebral white matter
bilaterally are unchanged and are not considered abnormal for age.
Multiple small chronic infarcts in the left greater than right
cerebellum are unchanged. No acute/new infarct, intracranial
hemorrhage, mass, midline shift, or extra-axial fluid collection is
identified. Mild cerebral atrophy is within normal limits for age.

Vascular: Major intracranial vascular flow voids are preserved.

Skull and upper cervical spine: Unremarkable bone marrow signal.

Sinuses/Orbits: Unremarkable orbits. Clear paranasal sinuses. Trace
left mastoid effusion.

Other: None.
IMPRESSION: 1. No acute intracranial abnormality.
2. Chronic bilateral cerebellar infarcts.

## 2019-08-11 MED ORDER — GADOBENATE DIMEGLUMINE 529 MG/ML IV SOLN
16.0000 mL | Freq: Once | INTRAVENOUS | Status: AC | PRN
  Administered 2019-08-11: 16 mL via INTRAVENOUS

## 2019-08-12 ENCOUNTER — Telehealth: Payer: Self-pay

## 2019-08-12 NOTE — Telephone Encounter (Signed)
-----   Message from Flanagan, DO sent at 08/12/2019  9:01 AM EST ----- Let pt know that MRI brain looks good.  All the areas that were abnormally enhancing are now resolved, consistent with stroke.

## 2019-08-12 NOTE — Telephone Encounter (Signed)
Patient aware of MRI results  

## 2019-08-18 ENCOUNTER — Telehealth (INDEPENDENT_AMBULATORY_CARE_PROVIDER_SITE_OTHER): Payer: Medicare Other | Admitting: *Deleted

## 2019-08-18 ENCOUNTER — Telehealth: Payer: Self-pay | Admitting: Emergency Medicine

## 2019-08-18 DIAGNOSIS — I442 Atrioventricular block, complete: Secondary | ICD-10-CM

## 2019-08-19 ENCOUNTER — Other Ambulatory Visit: Payer: Self-pay

## 2019-08-19 NOTE — Progress Notes (Signed)
Unable to do my chart video visit due to internet connection with patient. Contacted by phone and picture sent of wound by patient. Wound site without edema, drainage , or redness. Patient will contact office if he has any s/sx of infection.

## 2019-08-19 NOTE — Telephone Encounter (Signed)
Opened in error.CLR 

## 2019-08-21 HISTORY — PX: CHOLECYSTECTOMY, LAPAROSCOPIC: SHX56

## 2019-08-31 ENCOUNTER — Other Ambulatory Visit: Payer: Medicare Other

## 2019-09-03 DIAGNOSIS — Z85828 Personal history of other malignant neoplasm of skin: Secondary | ICD-10-CM | POA: Diagnosis not present

## 2019-09-03 DIAGNOSIS — Z23 Encounter for immunization: Secondary | ICD-10-CM | POA: Diagnosis not present

## 2019-09-03 DIAGNOSIS — D0362 Melanoma in situ of left upper limb, including shoulder: Secondary | ICD-10-CM | POA: Diagnosis not present

## 2019-09-07 ENCOUNTER — Ambulatory Visit (INDEPENDENT_AMBULATORY_CARE_PROVIDER_SITE_OTHER): Payer: Medicare Other | Admitting: *Deleted

## 2019-09-07 ENCOUNTER — Other Ambulatory Visit: Payer: Self-pay

## 2019-09-07 DIAGNOSIS — I639 Cerebral infarction, unspecified: Secondary | ICD-10-CM

## 2019-09-08 LAB — CUP PACEART REMOTE DEVICE CHECK
Date Time Interrogation Session: 20210118200506
Implantable Pulse Generator Implant Date: 20201216

## 2019-09-09 ENCOUNTER — Telehealth: Payer: Self-pay | Admitting: Oncology

## 2019-09-09 NOTE — Telephone Encounter (Signed)
Returned patient's phone call regarding rescheduling 01/22 appointment, per patient's request appointment has moved to 01/21.

## 2019-09-10 ENCOUNTER — Inpatient Hospital Stay: Payer: Medicare Other | Attending: Oncology

## 2019-09-10 ENCOUNTER — Other Ambulatory Visit: Payer: Self-pay

## 2019-09-10 DIAGNOSIS — R760 Raised antibody titer: Secondary | ICD-10-CM | POA: Diagnosis not present

## 2019-09-10 DIAGNOSIS — I8290 Acute embolism and thrombosis of unspecified vein: Secondary | ICD-10-CM

## 2019-09-11 ENCOUNTER — Other Ambulatory Visit: Payer: Medicare Other

## 2019-09-11 LAB — CARDIOLIPIN ANTIBODIES, IGG, IGM, IGA
Anticardiolipin IgA: <9 APL U/mL (ref 0–11)
Anticardiolipin IgG: <9 GPL U/mL (ref 0–14)
Anticardiolipin IgM: 92 MPL U/mL — ABNORMAL HIGH (ref 0–12)

## 2019-09-12 LAB — LUPUS ANTICOAGULANT PANEL
DRVVT: 92.2 s — ABNORMAL HIGH (ref 0.0–47.0)
PTT Lupus Anticoagulant: 56.2 s — ABNORMAL HIGH (ref 0.0–51.9)

## 2019-09-12 LAB — DRVVT MIX: dRVVT Mix: 67.5 s — ABNORMAL HIGH (ref 0.0–40.4)

## 2019-09-12 LAB — BETA-2-GLYCOPROTEIN I ABS, IGG/M/A
Beta-2 Glyco I IgG: <9 GPI IgG units (ref 0–20)
Beta-2-Glycoprotein I IgA: 17 GPI IgA units (ref 0–25)
Beta-2-Glycoprotein I IgM: >150 GPI IgM units — ABNORMAL HIGH (ref 0–32)

## 2019-09-12 LAB — HEXAGONAL PHASE PHOSPHOLIPID: Hexagonal Phase Phospholipid: 30 s — ABNORMAL HIGH (ref 0–11)

## 2019-09-12 LAB — DRVVT CONFIRM: dRVVT Confirm: 2.5 ratio — ABNORMAL HIGH (ref 0.8–1.2)

## 2019-09-12 LAB — PTT-LA MIX: PTT-LA Mix: 51.2 s — ABNORMAL HIGH (ref 0.0–48.9)

## 2019-09-22 DIAGNOSIS — H0102B Squamous blepharitis left eye, upper and lower eyelids: Secondary | ICD-10-CM | POA: Diagnosis not present

## 2019-09-22 DIAGNOSIS — H0102A Squamous blepharitis right eye, upper and lower eyelids: Secondary | ICD-10-CM | POA: Diagnosis not present

## 2019-09-22 DIAGNOSIS — D3132 Benign neoplasm of left choroid: Secondary | ICD-10-CM | POA: Diagnosis not present

## 2019-09-22 DIAGNOSIS — H10413 Chronic giant papillary conjunctivitis, bilateral: Secondary | ICD-10-CM | POA: Diagnosis not present

## 2019-09-22 DIAGNOSIS — H2513 Age-related nuclear cataract, bilateral: Secondary | ICD-10-CM | POA: Diagnosis not present

## 2019-09-22 DIAGNOSIS — H04123 Dry eye syndrome of bilateral lacrimal glands: Secondary | ICD-10-CM | POA: Diagnosis not present

## 2019-09-25 ENCOUNTER — Inpatient Hospital Stay: Payer: Medicare Other | Attending: Oncology | Admitting: Oncology

## 2019-09-25 ENCOUNTER — Other Ambulatory Visit: Payer: Self-pay

## 2019-09-25 VITALS — BP 145/64 | HR 62 | Temp 98.5°F | Resp 20 | Ht 67.0 in | Wt 179.1 lb

## 2019-09-25 DIAGNOSIS — R760 Raised antibody titer: Secondary | ICD-10-CM

## 2019-09-25 DIAGNOSIS — D6861 Antiphospholipid syndrome: Secondary | ICD-10-CM | POA: Diagnosis not present

## 2019-09-25 DIAGNOSIS — R251 Tremor, unspecified: Secondary | ICD-10-CM | POA: Diagnosis not present

## 2019-09-25 DIAGNOSIS — Z7982 Long term (current) use of aspirin: Secondary | ICD-10-CM | POA: Insufficient documentation

## 2019-09-25 DIAGNOSIS — R42 Dizziness and giddiness: Secondary | ICD-10-CM | POA: Insufficient documentation

## 2019-09-25 DIAGNOSIS — I639 Cerebral infarction, unspecified: Secondary | ICD-10-CM

## 2019-09-25 NOTE — Progress Notes (Signed)
Hematology and Oncology Follow Up Visit  Jordan [name] [id number] [birth year] 79 y.o. 09/25/2019 10:17 AM Jordan [name], MDPerini, Jordan Guadel[place], MD   Principle Diagnosis: 79 year old man with lupus anticoagulant and elevated beta-2 glycoprotein indicating possible antiphospholipid antibody syndrome.   Current therapy: Aspirin 81 mg daily Interim History: Jordan [name] returns today for a follow-up visit.  Since the last visit, he reports no major changes in his health.  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  He denies any neurological deficits, weakness dizziness or abnormal movement.  He does report some occasional tremor in his arm.  Denies any thrombosis or bleeding episodes.    Medications: I have reviewed the patient's current medications.  Current Outpatient Medications  Medication Sig Dispense Refill  . acetaminophen (TYLENOL) 500 MG tablet Take 500 mg by mouth 2 (two) times daily as needed for moderate pain.    . Cholecalciferol (VITAMIN D3) 250 MCG (10000 UT) capsule Take 10,000 Units by mouth 2 (two) times daily.    . diclofenac Sodium (VOLTAREN) 1 % GEL Apply 1 application topically 4 (four) times daily as needed (shoulder pain).    Marland Kitchen EPINEPHrine 0.3 mg/0.3 mL IJ SOAJ injection Inject 0.3 mg into the muscle as needed for anaphylaxis.     Marland Kitchen ezetimibe (ZETIA) 10 MG tablet Take 10 mg by mouth daily.     . famotidine (PEPCID) 10 MG tablet Take 10 mg by mouth 2 (two) times daily.    . Menthol (ICY HOT BACK) 5 % PTCH Apply 1 patch topically daily as needed (pain).    . Menthol, Topical Analgesic, (ICY HOT BACK EXTRA STRENGTH EX) Apply 1 application topically daily as needed (pain).    Marland Kitchen OVER THE COUNTER MEDICATION Take 1 Dose by mouth 2 (two) times daily. CBD oil    . pravastatin (PRAVACHOL) 20 MG tablet Take 20 mg by mouth every evening.     . tadalafil (ADCIRCA/CIALIS) 20 MG tablet Take 20 mg by mouth daily as needed for erectile dysfunction.    . vitamin B-12  (CYANOCOBALAMIN) 1000 MCG tablet Take 1,000 mcg by mouth 2 (two) times daily.     No current facility-administered medications for this visit.     Allergies:  Allergies  Allergen Reactions  . Nexium [Esomeprazole]     Unknown reaction  . Crestor [Rosuvastatin Calcium] Other (See Comments)    Muscle aches       Physical Exam: Blood pressure (!) 145/64, pulse 62, temperature 98.5 F (36.9 C), temperature source Temporal, resp. rate 20, height 5\' 7"  (1.702 m), weight 179 lb 1.6 oz (81.2 kg), SpO2 98 %.   ECOG: 0 General appearance: alert and cooperative appeared without distress. Head: Normocephalic, without obvious abnormality Oropharynx: No oral thrush or ulcers. Eyes: No scleral icterus.  Pupils are equal and round reactive to light. Lymph nodes: Cervical, supraclavicular, and axillary nodes normal. Heart:regular rate and rhythm, S1, S2 normal, no murmur, click, rub or gallop Lung:chest clear, no wheezing, rales, normal symmetric air entry Abdomin: soft, non-tender, without masses or organomegaly. Neurological: No motor, sensory deficits.  Intact deep tendon reflexes. Skin: No rashes or lesions.  No ecchymosis or petechiae. Musculoskeletal: No joint deformity or effusion.     Lab Results: Lab Results  Component Value Date   WBC 7.1 05/26/2019   HGB 14.3 05/26/2019   HCT 42.0 05/26/2019   MCV 94.9 05/26/2019   PLT 179 05/26/2019     Chemistry      Component Value  Date/Time   NA 140 05/26/2019 1630   NA 145 (H) 04/24/2017 1103   K 4.4 05/26/2019 1630   CL 102 05/26/2019 1630   CO2 27 05/26/2019 1622   BUN 25 (H) 05/26/2019 1630   BUN 19 04/24/2017 1103   CREATININE 1.10 05/26/2019 1630      Component Value Date/Time   CALCIUM 9.5 05/26/2019 1622   ALKPHOS 50 05/26/2019 1622   AST 22 05/26/2019 1622   ALT 20 05/26/2019 1622   BILITOT 0.7 05/26/2019 1622       Results for Jordan [name], Jordan [name] "[name]" (MRN [id number]) as of 09/25/2019 10:17  Ref. Range  09/10/2019 08:07  Anticardiolipin Ab,IgA,Qn Latest Ref Range: 0 - 11 APL U/mL <9  Anticardiolipin Ab,IgG,Qn Latest Ref Range: 0 - 14 GPL U/mL <9  Anticardiolipin Ab,IgM,Qn Latest Ref Range: 0 - 12 MPL U/mL 92 (H)  PTT Lupus Anticoagulant Latest Ref Range: 0.0 - 51.9 sec 56.2 (H)  DRVVT Latest Ref Range: 0.0 - 47.0 sec 92.2 (H)  Lupus Anticoag Interp Unknown Comment:  Hexagonal Phase Phospholipid Latest Ref Range: 0 - 11 sec 30 (H)  PTT-LA Mix Latest Ref Range: 0.0 - 48.9 sec 51.2 (H)  Beta-2 Glycoprotein I Ab, IgG Latest Ref Range: 0 - 20 GPI IgG units <9  Beta-2-Glycoprotein I IgA Latest Ref Range: 0 - 25 GPI IgA units 17  Beta-2-Glycoprotein I IgM Latest Ref Range: 0 - 32 GPI IgM units >150 (H)  dRVVT Mix Latest Ref Range: 0.0 - 40.4 sec 67.5 (H)  dRVVT Confirm Latest Ref Range: 0.8 - 1.2 ratio 2.5 (H)    Impression and Plan:  79 year old with:  1.  Antiphospholipid antibody syndrome detected in October 2020.  He presented with a positive lupus anticoagulant with elevated anticardiolipin antibody as well as beta-2 glycoprotein IgM subtype.  Laboratory testing from September 10, 2019 confirmed these findings although no clear-cut acute thrombosis noted.  Risks and benefits of full dose anticoagulation was discussed at this time.  At this point he is against that completely.  He identifies the risk of bleeding associated with his activity also acknowledges the risk of recurrent thrombosis.  He is currently on aspirin which appears to offer some protection against arterial events but not so much from any venous events.  After discussion today, he has opted to continue with aspirin and reconsider full dose anticoagulation if any venous thrombosis has been documented.     2.  Dizziness and abnormal movement: Unclear etiology at this time.  No evidence of arrhythmia or PFO by cardiac evaluation.  Continues to follow with neurology regarding this issue..  3.  Follow-up: I am happy to see  him in the future as needed.   30 minutes was dedicated to this encounter.  Time was spent on reviewing laboratory data, management options as well as answering questions regarding future plan of care.     Zola Button, MD 2/5/202110:17 AM

## 2019-09-30 DIAGNOSIS — Z23 Encounter for immunization: Secondary | ICD-10-CM | POA: Diagnosis not present

## 2019-10-08 ENCOUNTER — Ambulatory Visit (INDEPENDENT_AMBULATORY_CARE_PROVIDER_SITE_OTHER): Payer: Medicare Other | Admitting: *Deleted

## 2019-10-08 DIAGNOSIS — I639 Cerebral infarction, unspecified: Secondary | ICD-10-CM

## 2019-10-09 LAB — CUP PACEART REMOTE DEVICE CHECK
Date Time Interrogation Session: 20210218200621
Implantable Pulse Generator Implant Date: 20201216

## 2019-10-14 DIAGNOSIS — M1990 Unspecified osteoarthritis, unspecified site: Secondary | ICD-10-CM | POA: Diagnosis not present

## 2019-10-14 DIAGNOSIS — M8589 Other specified disorders of bone density and structure, multiple sites: Secondary | ICD-10-CM | POA: Diagnosis not present

## 2019-10-14 DIAGNOSIS — M5136 Other intervertebral disc degeneration, lumbar region: Secondary | ICD-10-CM | POA: Diagnosis not present

## 2019-11-04 DIAGNOSIS — J479 Bronchiectasis, uncomplicated: Secondary | ICD-10-CM | POA: Diagnosis not present

## 2019-11-04 DIAGNOSIS — M5136 Other intervertebral disc degeneration, lumbar region: Secondary | ICD-10-CM | POA: Diagnosis not present

## 2019-11-04 DIAGNOSIS — I6340 Cerebral infarction due to embolism of unspecified cerebral artery: Secondary | ICD-10-CM | POA: Diagnosis not present

## 2019-11-04 DIAGNOSIS — I771 Stricture of artery: Secondary | ICD-10-CM | POA: Diagnosis not present

## 2019-11-04 DIAGNOSIS — R9089 Other abnormal findings on diagnostic imaging of central nervous system: Secondary | ICD-10-CM | POA: Diagnosis not present

## 2019-11-04 DIAGNOSIS — I2510 Atherosclerotic heart disease of native coronary artery without angina pectoris: Secondary | ICD-10-CM | POA: Diagnosis not present

## 2019-11-04 DIAGNOSIS — Z5181 Encounter for therapeutic drug level monitoring: Secondary | ICD-10-CM | POA: Diagnosis not present

## 2019-11-04 DIAGNOSIS — R7301 Impaired fasting glucose: Secondary | ICD-10-CM | POA: Diagnosis not present

## 2019-11-04 DIAGNOSIS — R251 Tremor, unspecified: Secondary | ICD-10-CM | POA: Diagnosis not present

## 2019-11-04 DIAGNOSIS — D6862 Lupus anticoagulant syndrome: Secondary | ICD-10-CM | POA: Diagnosis not present

## 2019-11-09 ENCOUNTER — Ambulatory Visit (INDEPENDENT_AMBULATORY_CARE_PROVIDER_SITE_OTHER): Payer: Medicare Other | Admitting: *Deleted

## 2019-11-09 DIAGNOSIS — I639 Cerebral infarction, unspecified: Secondary | ICD-10-CM

## 2019-11-09 LAB — CUP PACEART REMOTE DEVICE CHECK
Date Time Interrogation Session: 20210321230441
Implantable Pulse Generator Implant Date: 20201216

## 2019-11-10 NOTE — Progress Notes (Signed)
ILR Remote 

## 2019-12-14 ENCOUNTER — Ambulatory Visit (INDEPENDENT_AMBULATORY_CARE_PROVIDER_SITE_OTHER): Payer: Medicare Other | Admitting: *Deleted

## 2019-12-14 DIAGNOSIS — I639 Cerebral infarction, unspecified: Secondary | ICD-10-CM | POA: Diagnosis not present

## 2019-12-14 LAB — CUP PACEART REMOTE DEVICE CHECK
Date Time Interrogation Session: 20210421230440
Implantable Pulse Generator Implant Date: 20201216

## 2019-12-14 NOTE — Progress Notes (Signed)
ILR Remote 

## 2020-01-11 ENCOUNTER — Ambulatory Visit (INDEPENDENT_AMBULATORY_CARE_PROVIDER_SITE_OTHER): Payer: Medicare Other | Admitting: *Deleted

## 2020-01-11 DIAGNOSIS — I639 Cerebral infarction, unspecified: Secondary | ICD-10-CM | POA: Diagnosis not present

## 2020-01-11 LAB — CUP PACEART REMOTE DEVICE CHECK
Date Time Interrogation Session: 20210522230519
Implantable Pulse Generator Implant Date: 20201216

## 2020-01-12 NOTE — Progress Notes (Signed)
[facility] Summary Report / Loop Recorder 

## 2020-01-26 NOTE — Progress Notes (Signed)
Assessment/Plan:   1.  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  No arrhythmias have been identified.  Pt needs f/u with cardiology at some point (probably yearly but don't see a recall)  -Patient with identified antiphospholipid antibody syndrome.  Patient following with hematology.  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  On aspirin.  -On pravastatin.  On Zetia.  -Discussed stroke signs and symptoms and to call 911 with any focal or lateralizing signs symptoms.  -Discussed that chorea symptoms may get worse if he gets ill otherwise.  2.  Patient will follow up with me on an as-needed basis.  He will continue to follow-up with cardiology and with hematology.   Subjective:   Jordan [name] was seen today in follow up for chorea.  States that still has occ movement in the R hand but not bad.  No lateralizing weak or paresthesias.  Mood has been good.  Patient had LINQ recorder placed on December 16.  He followed up with hematology regarding antiphospholipid antibody syndrome on February 5.  Notes are reviewed.  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  He is on aspirin.    CURRENT MEDICATIONS:  Outpatient Encounter Medications as of 01/28/2020  Medication Sig  . acetaminophen (TYLENOL) 500 MG tablet Take 500 mg by mouth 2 (two) times daily as needed for moderate pain.  . Cholecalciferol (VITAMIN D3) 250 MCG (10000 UT) capsule Take 10,000 Units by mouth 2 (two) times daily.  . diclofenac Sodium (VOLTAREN) 1 % GEL Apply 1 application topically 4 (four) times daily as needed (shoulder pain).  Marland Kitchen EPINEPHrine 0.3 mg/0.3 mL IJ SOAJ injection Inject 0.3 mg into the muscle as needed for anaphylaxis.   Marland Kitchen ezetimibe (ZETIA) 10 MG tablet Take 10 mg by mouth daily.   . famotidine (PEPCID) 10 MG tablet Take 10 mg by  mouth 2 (two) times daily.  . Menthol (ICY HOT BACK) 5 % PTCH Apply 1 patch topically daily as needed (pain).  . Menthol, Topical Analgesic, (ICY HOT BACK EXTRA STRENGTH EX) Apply 1 application topically daily as needed (pain).  Marland Kitchen OVER THE COUNTER MEDICATION Take 1 Dose by mouth 2 (two) times daily. CBD oil  . pravastatin (PRAVACHOL) 20 MG tablet Take 20 mg by mouth every evening.   . tadalafil (ADCIRCA/CIALIS) 20 MG tablet Take 20 mg by mouth daily as needed for erectile dysfunction.  . vitamin B-12 (CYANOCOBALAMIN) 1000 MCG tablet Take 1,000 mcg by mouth 2 (two) times daily.   No facility-administered encounter medications on file as of 01/28/2020.     Objective:   PHYSICAL EXAMINATION:    VITALS:   Vitals:   01/28/20 1117  BP: (!) 157/74  Pulse: 96  Resp: 18  SpO2: 99%  Weight: 185 lb (83.9 kg)  Height: 5\' 7"  (1.702 m)    Wt Readings from Last 3 Encounters:  01/28/20 185 lb (83.9 kg)  09/25/19 179 lb 1.6 oz (81.2 kg)  07/28/19 168 lb (76.2 kg)     GEN:  The patient appears stated age and is in NAD. HEENT:  Normocephalic, atraumatic.  The mucous membranes are moist. The superficial temporal arteries are without ropiness or tenderness. CV:  RRR Lungs:  CTAB Neck/HEME:  There are no carotid bruits bilaterally.  Neurological examination:  Orientation: The patient  is alert and oriented x3. Cranial nerves: There is good facial symmetry.The speech is fluent and clear. Soft palate rises symmetrically and there is no tongue deviation. Hearing is intact to conversational tone. Sensation: Sensation is intact to light touch throughout Motor: Strength is at least antigravity x4.  Movement examination: Tone: There is normal tone in the UE/LE Abnormal movements:  no tremor.  No myoclonus.  No asterixis.  Rare choreaform movement of the L fingers. Coordination:  There is no decremation with RAM's. Gait and Station: The patient has no difficulty arising out of a deep-seated chair  without the use of the hands. The patient's stride length is good.       Total time spent on today's visit was 25 minutes, including both face-to-face time and nonface-to-face time.  Time included that spent on review of records (prior notes available to me/labs/imaging if pertinent), discussing treatment and goals, answering patient's questions and coordinating care.  Cc:  Crist Infante, MD

## 2020-01-28 ENCOUNTER — Other Ambulatory Visit: Payer: Self-pay

## 2020-01-28 ENCOUNTER — Encounter: Payer: Self-pay | Admitting: Neurology

## 2020-01-28 ENCOUNTER — Ambulatory Visit (INDEPENDENT_AMBULATORY_CARE_PROVIDER_SITE_OTHER): Payer: Medicare Other | Admitting: Neurology

## 2020-01-28 VITALS — BP 157/74 | HR 96 | Resp 18 | Ht 67.0 in | Wt 185.0 lb

## 2020-01-28 DIAGNOSIS — I639 Cerebral infarction, unspecified: Secondary | ICD-10-CM

## 2020-01-28 DIAGNOSIS — G255 Other chorea: Secondary | ICD-10-CM

## 2020-01-28 NOTE — Patient Instructions (Signed)
1.  Make appointment with cardiology 2.  If any new neuro symptoms arise, call 911   The physicians and staff at [facility] are committed to providing excellent care. You may receive a survey requesting feedback about your experience at our office. We strive to receive "very good" responses to the survey questions. If you feel that your experience would prevent you from giving the office a "very good " response, please contact our office to try to remedy the situation. We may be reached at [phone]. Thank you for taking the time out of your busy day to complete the survey.

## 2020-02-04 DIAGNOSIS — L812 Freckles: Secondary | ICD-10-CM | POA: Diagnosis not present

## 2020-02-04 DIAGNOSIS — L821 Other seborrheic keratosis: Secondary | ICD-10-CM | POA: Diagnosis not present

## 2020-02-04 DIAGNOSIS — Z85820 Personal history of malignant melanoma of skin: Secondary | ICD-10-CM | POA: Diagnosis not present

## 2020-02-04 DIAGNOSIS — Z85828 Personal history of other malignant neoplasm of skin: Secondary | ICD-10-CM | POA: Diagnosis not present

## 2020-02-15 ENCOUNTER — Ambulatory Visit (INDEPENDENT_AMBULATORY_CARE_PROVIDER_SITE_OTHER): Payer: Medicare Other | Admitting: *Deleted

## 2020-02-15 DIAGNOSIS — I639 Cerebral infarction, unspecified: Secondary | ICD-10-CM

## 2020-02-15 LAB — CUP PACEART REMOTE DEVICE CHECK
Date Time Interrogation Session: 20210627230544
Implantable Pulse Generator Implant Date: 20201216

## 2020-02-16 NOTE — Progress Notes (Signed)
[facility] Summary Report / Loop Recorder 

## 2020-03-21 ENCOUNTER — Ambulatory Visit (INDEPENDENT_AMBULATORY_CARE_PROVIDER_SITE_OTHER): Payer: Medicare Other | Admitting: *Deleted

## 2020-03-21 DIAGNOSIS — I639 Cerebral infarction, unspecified: Secondary | ICD-10-CM | POA: Diagnosis not present

## 2020-03-22 LAB — CUP PACEART REMOTE DEVICE CHECK
Date Time Interrogation Session: 20210730230340
Implantable Pulse Generator Implant Date: 20201216

## 2020-03-23 NOTE — Progress Notes (Signed)
[facility] Summary Report / Loop Recorder 

## 2020-04-08 ENCOUNTER — Telehealth: Payer: Self-pay | Admitting: Neurology

## 2020-04-08 NOTE — Telephone Encounter (Signed)
Patient called in after looking over his after visit summary from his last appointment. He said he takes a medication that is not listed. He takes Valsartan 80mg  once every other day.

## 2020-04-08 NOTE — Telephone Encounter (Signed)
Spoke with patient , he requested I add valsartan to his medication list.   Valsartan has been added to his medication list. He voiced understanding and thanked me for calling him back.   Medication added to med list.

## 2020-04-13 DIAGNOSIS — Z23 Encounter for immunization: Secondary | ICD-10-CM | POA: Diagnosis not present

## 2020-04-21 ENCOUNTER — Ambulatory Visit (INDEPENDENT_AMBULATORY_CARE_PROVIDER_SITE_OTHER): Payer: Medicare Other | Admitting: *Deleted

## 2020-04-21 DIAGNOSIS — I639 Cerebral infarction, unspecified: Secondary | ICD-10-CM

## 2020-04-21 LAB — CUP PACEART REMOTE DEVICE CHECK
Date Time Interrogation Session: 20210901230542
Implantable Pulse Generator Implant Date: 20201216

## 2020-04-24 DIAGNOSIS — Z743 Need for continuous supervision: Secondary | ICD-10-CM | POA: Diagnosis not present

## 2020-04-24 DIAGNOSIS — K3580 Unspecified acute appendicitis: Secondary | ICD-10-CM | POA: Diagnosis not present

## 2020-04-24 DIAGNOSIS — K219 Gastro-esophageal reflux disease without esophagitis: Secondary | ICD-10-CM | POA: Diagnosis not present

## 2020-04-24 DIAGNOSIS — Z7982 Long term (current) use of aspirin: Secondary | ICD-10-CM | POA: Diagnosis not present

## 2020-04-24 DIAGNOSIS — R1031 Right lower quadrant pain: Secondary | ICD-10-CM | POA: Diagnosis not present

## 2020-04-24 DIAGNOSIS — K3532 Acute appendicitis with perforation and localized peritonitis, without abscess: Secondary | ICD-10-CM | POA: Diagnosis not present

## 2020-04-24 DIAGNOSIS — Z20822 Contact with and (suspected) exposure to covid-19: Secondary | ICD-10-CM | POA: Diagnosis present

## 2020-04-24 DIAGNOSIS — R1084 Generalized abdominal pain: Secondary | ICD-10-CM | POA: Diagnosis not present

## 2020-04-24 DIAGNOSIS — Z79899 Other long term (current) drug therapy: Secondary | ICD-10-CM | POA: Diagnosis not present

## 2020-04-24 DIAGNOSIS — Z888 Allergy status to other drugs, medicaments and biological substances status: Secondary | ICD-10-CM | POA: Diagnosis not present

## 2020-04-24 DIAGNOSIS — R0902 Hypoxemia: Secondary | ICD-10-CM | POA: Diagnosis not present

## 2020-04-24 DIAGNOSIS — I2510 Atherosclerotic heart disease of native coronary artery without angina pectoris: Secondary | ICD-10-CM | POA: Diagnosis not present

## 2020-04-24 DIAGNOSIS — Z951 Presence of aortocoronary bypass graft: Secondary | ICD-10-CM | POA: Diagnosis not present

## 2020-04-24 DIAGNOSIS — I10 Essential (primary) hypertension: Secondary | ICD-10-CM | POA: Diagnosis not present

## 2020-04-24 DIAGNOSIS — E785 Hyperlipidemia, unspecified: Secondary | ICD-10-CM | POA: Diagnosis not present

## 2020-04-26 NOTE — Progress Notes (Signed)
[facility] Summary Report / Loop Recorder 

## 2020-05-24 ENCOUNTER — Ambulatory Visit (INDEPENDENT_AMBULATORY_CARE_PROVIDER_SITE_OTHER): Payer: Medicare Other

## 2020-05-24 DIAGNOSIS — I639 Cerebral infarction, unspecified: Secondary | ICD-10-CM

## 2020-05-24 LAB — CUP PACEART REMOTE DEVICE CHECK
Date Time Interrogation Session: 20211004230156
Implantable Pulse Generator Implant Date: 20201216

## 2020-05-27 NOTE — Progress Notes (Signed)
[facility] Summary Report / Loop Recorder 

## 2020-06-08 DIAGNOSIS — Z23 Encounter for immunization: Secondary | ICD-10-CM | POA: Diagnosis not present

## 2020-06-09 DIAGNOSIS — Z125 Encounter for screening for malignant neoplasm of prostate: Secondary | ICD-10-CM | POA: Diagnosis not present

## 2020-06-09 DIAGNOSIS — E785 Hyperlipidemia, unspecified: Secondary | ICD-10-CM | POA: Diagnosis not present

## 2020-06-09 DIAGNOSIS — R82998 Other abnormal findings in urine: Secondary | ICD-10-CM | POA: Diagnosis not present

## 2020-06-09 DIAGNOSIS — R7301 Impaired fasting glucose: Secondary | ICD-10-CM | POA: Diagnosis not present

## 2020-06-15 DIAGNOSIS — R251 Tremor, unspecified: Secondary | ICD-10-CM | POA: Diagnosis not present

## 2020-06-15 DIAGNOSIS — E785 Hyperlipidemia, unspecified: Secondary | ICD-10-CM | POA: Diagnosis not present

## 2020-06-15 DIAGNOSIS — I6529 Occlusion and stenosis of unspecified carotid artery: Secondary | ICD-10-CM | POA: Diagnosis not present

## 2020-06-15 DIAGNOSIS — R7301 Impaired fasting glucose: Secondary | ICD-10-CM | POA: Diagnosis not present

## 2020-06-15 DIAGNOSIS — I10 Essential (primary) hypertension: Secondary | ICD-10-CM | POA: Diagnosis not present

## 2020-06-15 DIAGNOSIS — R2232 Localized swelling, mass and lump, left upper limb: Secondary | ICD-10-CM | POA: Diagnosis not present

## 2020-06-15 DIAGNOSIS — Q791 Other congenital malformations of diaphragm: Secondary | ICD-10-CM | POA: Diagnosis not present

## 2020-06-15 DIAGNOSIS — Z0000 Encounter for general adult medical examination without abnormal findings: Secondary | ICD-10-CM | POA: Diagnosis not present

## 2020-06-15 DIAGNOSIS — M8589 Other specified disorders of bone density and structure, multiple sites: Secondary | ICD-10-CM | POA: Diagnosis not present

## 2020-06-15 DIAGNOSIS — M1990 Unspecified osteoarthritis, unspecified site: Secondary | ICD-10-CM | POA: Diagnosis not present

## 2020-06-15 DIAGNOSIS — I2510 Atherosclerotic heart disease of native coronary artery without angina pectoris: Secondary | ICD-10-CM | POA: Diagnosis not present

## 2020-06-15 DIAGNOSIS — H6121 Impacted cerumen, right ear: Secondary | ICD-10-CM | POA: Diagnosis not present

## 2020-06-26 LAB — CUP PACEART REMOTE DEVICE CHECK
Date Time Interrogation Session: 20211106230115
Implantable Pulse Generator Implant Date: 20201216

## 2020-06-27 ENCOUNTER — Ambulatory Visit (INDEPENDENT_AMBULATORY_CARE_PROVIDER_SITE_OTHER): Payer: Medicare Other

## 2020-06-27 DIAGNOSIS — I442 Atrioventricular block, complete: Secondary | ICD-10-CM | POA: Diagnosis not present

## 2020-06-28 NOTE — Progress Notes (Signed)
[facility] Summary Report / Loop Recorder 

## 2020-07-18 DIAGNOSIS — Z85820 Personal history of malignant melanoma of skin: Secondary | ICD-10-CM | POA: Diagnosis not present

## 2020-07-18 DIAGNOSIS — D1722 Benign lipomatous neoplasm of skin and subcutaneous tissue of left arm: Secondary | ICD-10-CM | POA: Diagnosis not present

## 2020-07-18 DIAGNOSIS — L821 Other seborrheic keratosis: Secondary | ICD-10-CM | POA: Diagnosis not present

## 2020-07-18 DIAGNOSIS — D1801 Hemangioma of skin and subcutaneous tissue: Secondary | ICD-10-CM | POA: Diagnosis not present

## 2020-07-18 DIAGNOSIS — L812 Freckles: Secondary | ICD-10-CM | POA: Diagnosis not present

## 2020-07-18 DIAGNOSIS — L570 Actinic keratosis: Secondary | ICD-10-CM | POA: Diagnosis not present

## 2020-07-18 DIAGNOSIS — Z85828 Personal history of other malignant neoplasm of skin: Secondary | ICD-10-CM | POA: Diagnosis not present

## 2020-07-18 DIAGNOSIS — D485 Neoplasm of uncertain behavior of skin: Secondary | ICD-10-CM | POA: Diagnosis not present

## 2020-07-31 LAB — CUP PACEART REMOTE DEVICE CHECK
Date Time Interrogation Session: 20211210003039
Implantable Pulse Generator Implant Date: 20201216

## 2020-08-01 ENCOUNTER — Ambulatory Visit (INDEPENDENT_AMBULATORY_CARE_PROVIDER_SITE_OTHER): Payer: Medicare Other

## 2020-08-01 DIAGNOSIS — I639 Cerebral infarction, unspecified: Secondary | ICD-10-CM | POA: Diagnosis not present

## 2020-08-04 DIAGNOSIS — Z1212 Encounter for screening for malignant neoplasm of rectum: Secondary | ICD-10-CM | POA: Diagnosis not present

## 2020-08-16 NOTE — Progress Notes (Signed)
[facility] Summary Report / Loop Recorder 

## 2020-09-05 ENCOUNTER — Ambulatory Visit (INDEPENDENT_AMBULATORY_CARE_PROVIDER_SITE_OTHER): Payer: Medicare Other

## 2020-09-05 DIAGNOSIS — I639 Cerebral infarction, unspecified: Secondary | ICD-10-CM | POA: Diagnosis not present

## 2020-09-07 LAB — CUP PACEART REMOTE DEVICE CHECK
Date Time Interrogation Session: 20220111230106
Implantable Pulse Generator Implant Date: 20201216

## 2020-09-19 NOTE — Progress Notes (Signed)
[facility] Summary Report / Loop Recorder 

## 2020-09-26 DIAGNOSIS — H0102A Squamous blepharitis right eye, upper and lower eyelids: Secondary | ICD-10-CM | POA: Diagnosis not present

## 2020-09-26 DIAGNOSIS — H1045 Other chronic allergic conjunctivitis: Secondary | ICD-10-CM | POA: Diagnosis not present

## 2020-09-26 DIAGNOSIS — H04123 Dry eye syndrome of bilateral lacrimal glands: Secondary | ICD-10-CM | POA: Diagnosis not present

## 2020-09-26 DIAGNOSIS — D3132 Benign neoplasm of left choroid: Secondary | ICD-10-CM | POA: Diagnosis not present

## 2020-09-26 DIAGNOSIS — H2513 Age-related nuclear cataract, bilateral: Secondary | ICD-10-CM | POA: Diagnosis not present

## 2020-09-26 DIAGNOSIS — H0102B Squamous blepharitis left eye, upper and lower eyelids: Secondary | ICD-10-CM | POA: Diagnosis not present

## 2020-10-05 LAB — CUP PACEART REMOTE DEVICE CHECK
Date Time Interrogation Session: 20220213230342
Implantable Pulse Generator Implant Date: 20201216

## 2020-10-10 ENCOUNTER — Ambulatory Visit (INDEPENDENT_AMBULATORY_CARE_PROVIDER_SITE_OTHER): Payer: Medicare Other

## 2020-10-10 DIAGNOSIS — I639 Cerebral infarction, unspecified: Secondary | ICD-10-CM | POA: Diagnosis not present

## 2020-10-13 NOTE — Progress Notes (Signed)
[facility] Summary Report / Loop Recorder 

## 2020-11-03 DIAGNOSIS — R319 Hematuria, unspecified: Secondary | ICD-10-CM | POA: Diagnosis not present

## 2020-11-03 DIAGNOSIS — N390 Urinary tract infection, site not specified: Secondary | ICD-10-CM | POA: Diagnosis not present

## 2020-11-09 ENCOUNTER — Ambulatory Visit (INDEPENDENT_AMBULATORY_CARE_PROVIDER_SITE_OTHER): Payer: Medicare Other | Admitting: Internal Medicine

## 2020-11-09 ENCOUNTER — Encounter: Payer: Self-pay | Admitting: Internal Medicine

## 2020-11-09 ENCOUNTER — Other Ambulatory Visit: Payer: Self-pay

## 2020-11-09 VITALS — BP 144/60 | HR 81 | Ht 67.5 in | Wt 180.0 lb

## 2020-11-09 DIAGNOSIS — R195 Other fecal abnormalities: Secondary | ICD-10-CM | POA: Diagnosis not present

## 2020-11-09 DIAGNOSIS — Z86010 Personal history of colonic polyps: Secondary | ICD-10-CM

## 2020-11-09 DIAGNOSIS — R112 Nausea with vomiting, unspecified: Secondary | ICD-10-CM

## 2020-11-09 DIAGNOSIS — I639 Cerebral infarction, unspecified: Secondary | ICD-10-CM | POA: Diagnosis not present

## 2020-11-09 DIAGNOSIS — K625 Hemorrhage of anus and rectum: Secondary | ICD-10-CM | POA: Diagnosis not present

## 2020-11-09 NOTE — Progress Notes (Signed)
HISTORY OF PRESENT ILLNESS:  Jordan [name] is a 80 y.o. male, Jordan [name] and [employer] graduate school graduate, with a history of multiple and advanced colon polyps sent today by his primary care provider regarding rectal bleeding, dark stools, and vomiting.  Patient has had multiple colonoscopies in the past.  Most recently February 2020.  At that time he was found to have a diminutive adenoma, pandiverticulosis, and internal hemorrhoids.  He also has a remote history of GERD complicated by peptic stricture for which he underwent upper endoscopy esophageal dilation in 2005.  He is no longer taking PPI due to intolerance, but rather H2 receptor antagonist therapy.  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  He tells me that he was in his usual state of health until about 3 or 4 weeks ago when after awakening 1 morning went to the bathroom and noticed large quantity of blood per rectum.  Was not sure if it was from the bowel movement or process of urinating.  About 3 to 5 days after he describes his stools as dark.  Over the past 5 to 7 days he reports that was normal.  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  He did vomit 2 or 3 times and describes an amorphous material which was no blood.  Currently feeling back to baseline.  He did not have blood work with his PCP, whom he contacted 8 days ago.  He did however have urinalysis which he told me was unremarkable.  Blood work from October 2021 revealed hemoglobin of 15.5.  Hemoccult testing December 2021 was negative.  He has completed his COVID vaccination series.  Takes aspirin but no other NSAIDs.  No blood thinners  REVIEW OF SYSTEMS:  All non-GI ROS negative unless otherwise stated in the HPI except for back pain, hearing problems, muscle cramps, confusion, cough, fatigue, sleeping problems  Past Medical History:  Diagnosis Date  . Abnormal nuclear stress test 03/05/2017  . Bibasilar crackles 02/26/2017  . Carotid  stenosis, asymptomatic 07/27/2011  . DDD (degenerative disc disease)   . Dyspnea   . Dyspnea on exertion 01/28/2017  . ED (erectile dysfunction)   . Essential hypertension 01/29/2017  . GERD (gastroesophageal reflux disease)   . History of hiatal hernia   . History of kidney stones    passed stones - no surgery required  . History of wheezing 02/26/2017  . HTN (hypertension)   . Hyperlipidemia   . Hyperlipidemia with target low density lipoprotein (LDL) cholesterol less than 70 mg/dL 03/31/2014  . Impaired fasting glucose   . Knee pain    bilateral  . Left main coronary artery disease 03/18/2017   Cardiac Cath: 55% dLM, tandem p-mLAD 75% --> referred for CABG.  . Mediastinal adenopathy 02/26/2017  . Meniere's disease   . Meniere's vertigo   . Migraines    occular - last one 07/2018  . Mild carotid artery disease ([place])   . Ocular migraine   . 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 Cyndia Bent @ [facility]):  LIMA-LAD, SVG-D2, SVG-OM, SVG-PDA (endoscopic SVG harvesting from right leg).  . Shoulder pain   . Wears hearing aid     Past Surgical History:  Procedure Laterality Date  . BUBBLE STUDY  08/05/2019   Procedure: BUBBLE STUDY;  Surgeon: Buford Dresser, MD;  Location: [place];  Service: Cardiovascular;;  . Cardiac Stress Test  2008   Normal  . CARPAL TUNNEL RELEASE  Left 1998  . CARPAL TUNNEL RELEASE Right 2008  . CHOLECYSTECTOMY, LAPAROSCOPIC  2021   in [place]  . COLONOSCOPY  06/2013   Henrene Pastor - polyps  . CORONARY ARTERY BYPASS GRAFT N/A 04/04/2017   Procedure: CORONARY ARTERY BYPASS GRAFTING x 4  LIMA-LAD, SVG-D2, SVG-OM, SVG-PDA (endoscopic SVG harvesting from right leg);  Surgeon: Gaye Pollack, MD;  Location: [place];  Service: Open Heart Surgery;  Laterality: N/A;  . EXTRACORPOREAL SHOCK WAVE LITHOTRIPSY Right 10/23/2018   Procedure: EXTRACORPOREAL SHOCK WAVE LITHOTRIPSY (ESWL);  Surgeon: Bjorn Loser, MD;  Location: [facility];  Service: Urology;  Laterality:  Right;  . HERNIA REPAIR Left    inguinal  . HIATAL HERNIA REPAIR     no surgery per pt  . KNEE CARTILAGE SURGERY Bilateral   . LEFT HEART CATH AND CORONARY ANGIOGRAPHY N/A 03/18/2017   Procedure: Left Heart Cath and Coronary Angiography;  Surgeon: Leonie Man, MD;  Location: [place] [facility];  Service: Cardiovascular:  55% dLM, p-mLAD 75% - mLAD 75%.  EF 55-65%. Mod non-obstructive RCA disease --> referred for CABG  . LOOP RECORDER INSERTION N/A 08/05/2019   Procedure: LOOP RECORDER INSERTION;  Surgeon: Constance Haw, MD;  Location: [place] [facility];  Service: Cardiovascular;  Laterality: N/A;  . MEDIAL PARTIAL KNEE REPLACEMENT Right 2010   Partial right knee replacement  . NM MYOVIEW LTD  01/2017   INTERMEDIATE RISK: Exercise tolerance was good at 10 minutes, however, fatigue and dyspnea were reporte-d -> hypertensive response to exercise.  32mm Horizontal ST depressions noted during stress in the II, III, aVF, V6, V5 and V4 leads c/w ischemia.   Small siize, moderate severity perfusion defect  mid to distal anterior wall perfusion defect suggestive of ischemia..   . ROTATOR CUFF REPAIR Left 2008  . SHOULDER ARTHROSCOPY W/ ROTATOR CUFF REPAIR Right 06/15/2016  . TEE WITHOUT CARDIOVERSION N/A 04/04/2017   Procedure: TRANSESOPHAGEAL ECHOCARDIOGRAM (TEE);  Surgeon: Gaye Pollack, MD;  Location: [place];  Service: Open Heart Surgery;  Laterality: N/A;  . TEE WITHOUT CARDIOVERSION N/A 08/05/2019   Procedure: TRANSESOPHAGEAL ECHOCARDIOGRAM (TEE);  Surgeon: Buford Dresser, MD;  Location: [facility];  Service: Cardiovascular;  Laterality: N/A;  . TONSILLECTOMY      Social History Jordan [name]  reports that he has never smoked. He has never used smokeless tobacco. He reports current alcohol use. He reports that he does not use drugs.  family history includes Cancer in his mother; Coronary artery disease in his father; Healthy in his daughter; Heart attack in his  father; Heart failure in his mother; Hyperlipidemia in his brother, father, and son; Hypertension in his brother.  Allergies  Allergen Reactions  . Nexium [Esomeprazole]     Unknown reaction  . Crestor [Rosuvastatin Calcium] Other (See Comments)    Muscle aches        PHYSICAL EXAMINATION: Vital signs: BP (!) 144/60   Pulse 81   Ht 5' 7.5" (1.715 m)   Wt 180 lb (81.6 kg)   SpO2 95%   BMI 27.78 kg/m   Constitutional: generally well-appearing, no acute distress Psychiatric: alert and oriented x3, cooperative Eyes: extraocular movements intact, anicteric, conjunctiva pink Mouth: oral pharynx moist, no lesions Neck: supple no lymphadenopathy Cardiovascular: heart regular rate and rhythm, no murmur Lungs: clear to auscultation bilaterally Abdomen: soft, nontender, nondistended, no obvious ascites, no peritoneal signs, normal bowel sounds, no organomegaly Rectal: Deferred to colonoscopy Extremities: no clubbing, cyanosis, or lower extremity edema bilaterally Skin:  no lesions on visible extremities Neuro: No focal deficits.  Cranial nerves intact  ASSESSMENT:  1.  History of self-limited rectal bleeding.  Possible etiologies include internal hemorrhoids, diverticular bleeding, AVM, or less likely neoplasm. 2.  History of dark stools.  Question melena 3.  Self-limited vomiting episodes 4.  History of GERD complicated by peptic stricture.  Seemingly asymptomatic post remote dilation on famotidine 5.  Multiple medical problems.  Stable   PLAN:  1.  Schedule colonoscopy and upper endoscopy to evaluate rectal bleeding, dark stools, and episodic vomiting.The nature of the procedure, as well as the risks, benefits, and alternatives were carefully and thoroughly reviewed with the patient. Ample time for discussion and questions allowed. The patient understood, was satisfied, and agreed to proceed. 2.  Contact the office in the interim for any recurrent problems or questions

## 2020-11-14 ENCOUNTER — Ambulatory Visit (INDEPENDENT_AMBULATORY_CARE_PROVIDER_SITE_OTHER): Payer: Medicare Other

## 2020-11-14 DIAGNOSIS — I639 Cerebral infarction, unspecified: Secondary | ICD-10-CM

## 2020-11-14 LAB — CUP PACEART REMOTE DEVICE CHECK
Date Time Interrogation Session: 20220318230419
Implantable Pulse Generator Implant Date: 20201216

## 2020-11-28 NOTE — Progress Notes (Signed)
[facility] Summary Report / Loop Recorder 

## 2020-11-30 ENCOUNTER — Telehealth: Payer: Self-pay

## 2020-11-30 DIAGNOSIS — Z86010 Personal history of colonic polyps: Secondary | ICD-10-CM

## 2020-11-30 MED ORDER — SUTAB 1479-225-188 MG PO TABS: 1.0000 | ORAL_TABLET | Freq: Once | ORAL | 0 refills | Status: AC

## 2020-11-30 NOTE — Telephone Encounter (Signed)
Scanned and emailed ECL instructions to patient and faxed Sutab rx and coupon to pharmacy

## 2020-12-14 DIAGNOSIS — I2510 Atherosclerotic heart disease of native coronary artery without angina pectoris: Secondary | ICD-10-CM | POA: Diagnosis not present

## 2020-12-14 DIAGNOSIS — R7301 Impaired fasting glucose: Secondary | ICD-10-CM | POA: Diagnosis not present

## 2020-12-14 DIAGNOSIS — I6529 Occlusion and stenosis of unspecified carotid artery: Secondary | ICD-10-CM | POA: Diagnosis not present

## 2020-12-14 DIAGNOSIS — K219 Gastro-esophageal reflux disease without esophagitis: Secondary | ICD-10-CM | POA: Diagnosis not present

## 2020-12-14 DIAGNOSIS — D6862 Lupus anticoagulant syndrome: Secondary | ICD-10-CM | POA: Diagnosis not present

## 2020-12-14 DIAGNOSIS — M5136 Other intervertebral disc degeneration, lumbar region: Secondary | ICD-10-CM | POA: Diagnosis not present

## 2020-12-14 DIAGNOSIS — I10 Essential (primary) hypertension: Secondary | ICD-10-CM | POA: Diagnosis not present

## 2020-12-14 DIAGNOSIS — I6340 Cerebral infarction due to embolism of unspecified cerebral artery: Secondary | ICD-10-CM | POA: Diagnosis not present

## 2020-12-14 DIAGNOSIS — E785 Hyperlipidemia, unspecified: Secondary | ICD-10-CM | POA: Diagnosis not present

## 2020-12-14 DIAGNOSIS — J479 Bronchiectasis, uncomplicated: Secondary | ICD-10-CM | POA: Diagnosis not present

## 2020-12-14 DIAGNOSIS — M1990 Unspecified osteoarthritis, unspecified site: Secondary | ICD-10-CM | POA: Diagnosis not present

## 2020-12-15 ENCOUNTER — Other Ambulatory Visit: Payer: Self-pay

## 2020-12-15 ENCOUNTER — Ambulatory Visit: Payer: Medicare Other | Attending: Internal Medicine

## 2020-12-15 ENCOUNTER — Other Ambulatory Visit (HOSPITAL_BASED_OUTPATIENT_CLINIC_OR_DEPARTMENT_OTHER): Payer: Self-pay

## 2020-12-15 DIAGNOSIS — Z23 Encounter for immunization: Secondary | ICD-10-CM

## 2020-12-15 MED ORDER — PFIZER-BIONT COVID-19 VAC-TRIS 30 MCG/0.3ML IM SUSP
INTRAMUSCULAR | 0 refills | Status: DC
  Filled 2020-12-15: qty 0.3, 1d supply, fill #0

## 2020-12-15 NOTE — Progress Notes (Signed)
   Covid-19 Vaccination Clinic  Name:  Dennie Moltz    MRN: [id number] DOB: [birth year]  12/15/2020  Mr. Waage was observed post Covid-19 immunization for 15 minutes without incident. He was provided with Vaccine Information Sheet and instruction to access the V-Safe system.   Mr. Saiz was instructed to call 911 with any severe reactions post vaccine: Marland Kitchen Difficulty breathing  . Swelling of face and throat  . A fast heartbeat  . A bad rash all over body  . Dizziness and weakness   Immunizations Administered    Name Date Dose VIS Date Route   PFIZER Comrnaty(Gray TOP) Covid-19 Vaccine 12/15/2020 11:22 AM 0.3 mL 07/28/2020 Intramuscular   Manufacturer: [employer]   Lot: [id number]   [place]: [phone]

## 2020-12-19 ENCOUNTER — Ambulatory Visit (INDEPENDENT_AMBULATORY_CARE_PROVIDER_SITE_OTHER): Payer: Medicare Other

## 2020-12-19 DIAGNOSIS — I639 Cerebral infarction, unspecified: Secondary | ICD-10-CM

## 2020-12-20 LAB — CUP PACEART REMOTE DEVICE CHECK
Date Time Interrogation Session: 20220430230219
Implantable Pulse Generator Implant Date: 20201216

## 2021-01-09 DIAGNOSIS — Z85828 Personal history of other malignant neoplasm of skin: Secondary | ICD-10-CM | POA: Diagnosis not present

## 2021-01-09 DIAGNOSIS — Z85820 Personal history of malignant melanoma of skin: Secondary | ICD-10-CM | POA: Diagnosis not present

## 2021-01-09 DIAGNOSIS — D1722 Benign lipomatous neoplasm of skin and subcutaneous tissue of left arm: Secondary | ICD-10-CM | POA: Diagnosis not present

## 2021-01-09 DIAGNOSIS — L570 Actinic keratosis: Secondary | ICD-10-CM | POA: Diagnosis not present

## 2021-01-09 DIAGNOSIS — D225 Melanocytic nevi of trunk: Secondary | ICD-10-CM | POA: Diagnosis not present

## 2021-01-09 DIAGNOSIS — L603 Nail dystrophy: Secondary | ICD-10-CM | POA: Diagnosis not present

## 2021-01-09 DIAGNOSIS — L821 Other seborrheic keratosis: Secondary | ICD-10-CM | POA: Diagnosis not present

## 2021-01-09 DIAGNOSIS — L812 Freckles: Secondary | ICD-10-CM | POA: Diagnosis not present

## 2021-01-10 NOTE — Progress Notes (Signed)
[facility] Summary Report / Loop Recorder 

## 2021-01-22 LAB — CUP PACEART REMOTE DEVICE CHECK
Date Time Interrogation Session: 20220602230711
Implantable Pulse Generator Implant Date: 20201216

## 2021-01-23 ENCOUNTER — Ambulatory Visit (INDEPENDENT_AMBULATORY_CARE_PROVIDER_SITE_OTHER): Payer: Medicare Other

## 2021-01-23 DIAGNOSIS — I639 Cerebral infarction, unspecified: Secondary | ICD-10-CM | POA: Diagnosis not present

## 2021-01-25 ENCOUNTER — Other Ambulatory Visit: Payer: Self-pay

## 2021-01-25 ENCOUNTER — Ambulatory Visit (AMBULATORY_SURGERY_CENTER): Payer: Medicare Other | Admitting: Internal Medicine

## 2021-01-25 ENCOUNTER — Encounter: Payer: Self-pay | Admitting: Internal Medicine

## 2021-01-25 VITALS — BP 118/67 | HR 51 | Temp 96.9°F | Resp 13 | Ht 67.5 in | Wt 180.0 lb

## 2021-01-25 DIAGNOSIS — K921 Melena: Secondary | ICD-10-CM | POA: Diagnosis not present

## 2021-01-25 DIAGNOSIS — K449 Diaphragmatic hernia without obstruction or gangrene: Secondary | ICD-10-CM

## 2021-01-25 DIAGNOSIS — K625 Hemorrhage of anus and rectum: Secondary | ICD-10-CM

## 2021-01-25 DIAGNOSIS — K648 Other hemorrhoids: Secondary | ICD-10-CM | POA: Diagnosis not present

## 2021-01-25 DIAGNOSIS — D120 Benign neoplasm of cecum: Secondary | ICD-10-CM

## 2021-01-25 DIAGNOSIS — R195 Other fecal abnormalities: Secondary | ICD-10-CM

## 2021-01-25 DIAGNOSIS — K253 Acute gastric ulcer without hemorrhage or perforation: Secondary | ICD-10-CM

## 2021-01-25 DIAGNOSIS — Z86010 Personal history of colonic polyps: Secondary | ICD-10-CM

## 2021-01-25 DIAGNOSIS — K5730 Diverticulosis of large intestine without perforation or abscess without bleeding: Secondary | ICD-10-CM | POA: Diagnosis not present

## 2021-01-25 DIAGNOSIS — R112 Nausea with vomiting, unspecified: Secondary | ICD-10-CM

## 2021-01-25 DIAGNOSIS — Z1211 Encounter for screening for malignant neoplasm of colon: Secondary | ICD-10-CM | POA: Diagnosis not present

## 2021-01-25 DIAGNOSIS — K219 Gastro-esophageal reflux disease without esophagitis: Secondary | ICD-10-CM | POA: Diagnosis not present

## 2021-01-25 MED ORDER — SODIUM CHLORIDE 0.9 % IV SOLN: 500.0000 mL | INTRAVENOUS | Status: DC

## 2021-01-25 NOTE — Progress Notes (Signed)
A and O x3. Report to RN. Tolerated MAC anesthesia well.Teeth unchanged after procedure.

## 2021-01-25 NOTE — Op Note (Signed)
Jordan [name] Patient Name: Jordan [name] Procedure Date: 01/25/2021 9:15 AM MRN: [id number] Endoscopist: Docia Chuck. Henrene Pastor , MD Age: 80 Referring MD:  Date of Birth: 08-27-1940 Gender: Male Account #: [id number] Procedure:                Upper GI endoscopy with biopsies Indications:              Melena (transient dark stools) Medicines:                Monitored Anesthesia Care Procedure:                Pre-Anesthesia Assessment:                           - Prior to the procedure, a History and Physical                            was performed, and patient medications and                            allergies were reviewed. The patient's tolerance of                            previous anesthesia was also reviewed. The risks                            and benefits of the procedure and the sedation                            options and risks were discussed with the patient.                            All questions were answered, and informed consent                            was obtained. Prior Anticoagulants: The patient has                            taken no previous anticoagulant or antiplatelet                            agents. ASA Grade Assessment: II - A patient with                            mild systemic disease. After reviewing the risks                            and benefits, the patient was deemed in                            satisfactory condition to undergo the procedure.                           After obtaining informed consent, the endoscope was  passed under direct vision. Throughout the                            procedure, the patient's blood pressure, pulse, and                            oxygen saturations were monitored continuously. The                            Endoscope was introduced through the mouth, and                            advanced to the second part of duodenum. The upper                            GI endoscopy  was accomplished without difficulty.                            The patient tolerated the procedure well. Scope In: Scope Out: Findings:                 The esophagus was normal.                           The stomach revealed diffuse superficial erosions                            in the proximal antrum. Biopsies were taken with a                            cold forceps for Helicobacter pylori testing using                            CLOtest. Stomach was otherwise normal. Small hiatal                            hernia                           The examined duodenum was normal.                           The cardia and gastric fundus were normal on                            retroflexion. Complications:            No immediate complications. Estimated Blood Loss:     Estimated blood loss: none. Impression:               1. Few small gastric erosions                           2. Otherwise normal EGD Recommendation:           - Patient has a contact number available for  emergencies. The signs and symptoms of potential                            delayed complications were discussed with the                            patient. Return to normal activities tomorrow.                            Written discharge instructions were provided to the                            patient.                           - Resume previous diet.                           - Continue present medications, including                            famotidine.                           - Await pathology results. Docia Chuck. Henrene Pastor, MD 01/25/2021 10:06:15 AM This report has been signed electronically.

## 2021-01-25 NOTE — Progress Notes (Signed)
Called to room to assist during endoscopic procedure.  Patient ID and intended procedure confirmed with present staff. Received instructions for my participation in the procedure from the performing physician.  

## 2021-01-25 NOTE — Op Note (Signed)
[place] Patient Name: Jordan [name] Procedure Date: 01/25/2021 9:15 AM MRN: [id number] Endoscopist: Docia Chuck. Henrene Pastor , MD Age: 80 Referring MD:  Date of Birth: 08/02/1941 Gender: Male Account #: [id number] Procedure:                Colonoscopy with cold snare polypectomy x 1 Indications:              Rectal bleeding. History of multiple adenomatous                            polyps. Previous examinations 2004, 2006, 2010, 2014 Medicines:                Monitored Anesthesia Care Procedure:                Pre-Anesthesia Assessment:                           - Prior to the procedure, a History and Physical                            was performed, and patient medications and                            allergies were reviewed. The patient's tolerance of                            previous anesthesia was also reviewed. The risks                            and benefits of the procedure and the sedation                            options and risks were discussed with the patient.                            All questions were answered, and informed consent                            was obtained. Prior Anticoagulants: The patient has                            taken no previous anticoagulant or antiplatelet                            agents. ASA Grade Assessment: II - A patient with                            mild systemic disease. After reviewing the risks                            and benefits, the patient was deemed in                            satisfactory condition to undergo the procedure.  After obtaining informed consent, the colonoscope                            was passed under direct vision. Throughout the                            procedure, the patient's blood pressure, pulse, and                            oxygen saturations were monitored continuously. The                            Olympus [id number]) Colonoscope was                             introduced through the anus and advanced to the the                            cecum, identified by appendiceal orifice and                            ileocecal valve. The ileocecal valve, appendiceal                            orifice, and rectum were photographed. The quality                            of the bowel preparation was excellent. The                            colonoscopy was performed without difficulty. The                            patient tolerated the procedure well. The bowel                            preparation used was SUPREP/tablets via split dose                            instruction. Scope In: 9:28:37 AM Scope Out: 9:44:26 AM Scope Withdrawal Time: 0 hours 13 minutes 45 seconds  Total Procedure Duration: 0 hours 15 minutes 49 seconds  Findings:                 A 3 mm polyp was found in the cecum. The polyp was                            removed with a cold snare. Resection and retrieval                            were complete.                           Multiple diverticula were found in the sigmoid  colon and ascending colon.                           Internal hemorrhoids were found during retroflexion.                           The exam was otherwise without abnormality on                            direct and retroflexion views. Complications:            No immediate complications. Estimated blood loss:                            None. Estimated Blood Loss:     Estimated blood loss: none. Impression:               - One 3 mm polyp in the cecum, removed with a cold                            snare. Resected and retrieved.                           - Diverticulosis in the sigmoid colon and in the                            ascending colon.                           - Internal hemorrhoids.                           - The examination was otherwise normal on direct                            and retroflexion  views. Recommendation:           - Repeat colonoscopy is not recommended for                            surveillance.                           - Patient has a contact number available for                            emergencies. The signs and symptoms of potential                            delayed complications were discussed with the                            patient. Return to normal activities tomorrow.                            Written discharge instructions were provided to the  patient.                           - Resume previous diet.                           - Continue present medications.                           - Await pathology results. Docia Chuck. Henrene Pastor, MD 01/25/2021 9:56:41 AM This report has been signed electronically.

## 2021-01-25 NOTE — Progress Notes (Signed)
HC vitals and JD IV.

## 2021-01-25 NOTE — Patient Instructions (Signed)
Handouts on polyps ,diverticulosis,& hemorrhoids given to you today  Await pathology results on polyp removed and on biopsies from stomach ( erosions in stomach)  Continue medications ,including Famotidine   YOU HAD AN ENDOSCOPIC PROCEDURE TODAY AT [place]:   Refer to the procedure report that was given to you for any specific questions about what was found during the examination.  If the procedure report does not answer your questions, please call your gastroenterologist to clarify.  If you requested that your care partner not be given the details of your procedure findings, then the procedure report has been included in a sealed envelope for you to review at your convenience later.  YOU SHOULD EXPECT: Some feelings of bloating in the abdomen. Passage of more gas than usual.  Walking can help get rid of the air that was put into your GI tract during the procedure and reduce the bloating. If you had a lower endoscopy (such as a colonoscopy or flexible sigmoidoscopy) you may notice spotting of blood in your stool or on the toilet paper. If you underwent a bowel prep for your procedure, you may not have a normal bowel movement for a few days.  Please Note:  You might notice some irritation and congestion in your nose or some drainage.  This is from the oxygen used during your procedure.  There is no need for concern and it should clear up in a day or so.  SYMPTOMS TO REPORT IMMEDIATELY:   Following lower endoscopy (colonoscopy or flexible sigmoidoscopy):  Excessive amounts of blood in the stool  Significant tenderness or worsening of abdominal pains  Swelling of the abdomen that is new, acute  Fever of 100F or higher   Following upper endoscopy (EGD)  Vomiting of blood or coffee ground material  New chest pain or pain under the shoulder blades  Painful or persistently difficult swallowing  New shortness of breath  Fever of 100F or higher  Black, tarry-looking  stools  For urgent or emergent issues, a gastroenterologist can be reached at any hour by calling [phone]. Do not use MyChart messaging for urgent concerns.    DIET:  We do recommend a small meal at first, but then you may proceed to your regular diet.  Drink plenty of fluids but you should avoid alcoholic beverages for 24 hours.  ACTIVITY:  You should plan to take it easy for the rest of today and you should NOT DRIVE or use heavy machinery until tomorrow (because of the sedation medicines used during the test).    FOLLOW UP: Our staff will call the number listed on your records 48-72 hours following your procedure to check on you and address any questions or concerns that you may have regarding the information given to you following your procedure. If we do not reach you, we will leave a message.  We will attempt to reach you two times.  During this call, we will ask if you have developed any symptoms of COVID 19. If you develop any symptoms (ie: fever, flu-like symptoms, shortness of breath, cough etc.) before then, please call [phone].  If you test positive for Covid 19 in the 2 weeks post procedure, please call and report this information to [place].    If any biopsies were taken you will be contacted by phone or by letter within the next 1-3 weeks.  Please call us at [phone] if you have not heard about the biopsies in 3 weeks.  SIGNATURES/CONFIDENTIALITY: You and/or your care partner have signed paperwork which will be entered into your electronic medical record.  These signatures attest to the fact that that the information above on your After Visit Summary has been reviewed and is understood.  Full responsibility of the confidentiality of this discharge information lies with you and/or your care-partner.

## 2021-01-26 LAB — HELICOBACTER PYLORI SCREEN-BIOPSY: UREASE: NEGATIVE

## 2021-01-27 ENCOUNTER — Telehealth: Payer: Self-pay | Admitting: *Deleted

## 2021-01-27 NOTE — Telephone Encounter (Signed)
  Follow up Call-  Call back number 01/25/2021 09/23/2018  Post procedure Call Back phone  # [phone]  Permission to leave phone message Yes Yes  Some recent data might be hidden     Patient questions:  Do you have a fever, pain , or abdominal swelling? No. Pain Score  0 *  Have you tolerated food without any problems? Yes.    Have you been able to return to your normal activities? Yes.    Do you have any questions about your discharge instructions: Diet   No. Medications  No. Follow up visit  No.  Do you have questions or concerns about your Care? No.  Actions: * If pain score is 4 or above: No action needed, pain <4.  Have you developed a fever since your procedure? no  2.   Have you had an respiratory symptoms (SOB or cough) since your procedure? no  3.   Have you tested positive for COVID 19 since your procedure no  4.   Have you had any family members/close contacts diagnosed with the COVID 19 since your procedure?  no   If yes to any of these questions please route to Joylene John, RN and Joella Prince, RN

## 2021-01-30 ENCOUNTER — Encounter: Payer: Self-pay | Admitting: Internal Medicine

## 2021-02-14 NOTE — Progress Notes (Signed)
[facility] Summary Report / Loop Recorder 

## 2021-02-27 ENCOUNTER — Ambulatory Visit (INDEPENDENT_AMBULATORY_CARE_PROVIDER_SITE_OTHER): Payer: Medicare Other

## 2021-02-27 DIAGNOSIS — I442 Atrioventricular block, complete: Secondary | ICD-10-CM

## 2021-02-27 LAB — CUP PACEART REMOTE DEVICE CHECK
Date Time Interrogation Session: 20220705230727
Implantable Pulse Generator Implant Date: 20201216

## 2021-03-22 NOTE — Progress Notes (Signed)
[facility] Summary Report / Loop Recorder 

## 2021-04-03 ENCOUNTER — Ambulatory Visit (INDEPENDENT_AMBULATORY_CARE_PROVIDER_SITE_OTHER): Payer: Medicare Other

## 2021-04-03 DIAGNOSIS — I442 Atrioventricular block, complete: Secondary | ICD-10-CM

## 2021-04-04 LAB — CUP PACEART REMOTE DEVICE CHECK
Date Time Interrogation Session: 20220807230513
Implantable Pulse Generator Implant Date: 20201216

## 2021-04-22 NOTE — Progress Notes (Signed)
[facility] Summary Report / Loop Recorder 

## 2021-04-28 LAB — CUP PACEART REMOTE DEVICE CHECK
Date Time Interrogation Session: 20220909230349
Implantable Pulse Generator Implant Date: 20201216

## 2021-05-08 ENCOUNTER — Ambulatory Visit (INDEPENDENT_AMBULATORY_CARE_PROVIDER_SITE_OTHER): Payer: Medicare Other

## 2021-05-08 DIAGNOSIS — I639 Cerebral infarction, unspecified: Secondary | ICD-10-CM | POA: Diagnosis not present

## 2021-05-15 NOTE — Progress Notes (Signed)
[facility] Summary Report / Loop Recorder 

## 2021-05-19 DIAGNOSIS — E785 Hyperlipidemia, unspecified: Secondary | ICD-10-CM | POA: Diagnosis not present

## 2021-05-19 DIAGNOSIS — I10 Essential (primary) hypertension: Secondary | ICD-10-CM | POA: Diagnosis not present

## 2021-05-19 DIAGNOSIS — I2510 Atherosclerotic heart disease of native coronary artery without angina pectoris: Secondary | ICD-10-CM | POA: Diagnosis not present

## 2021-05-19 DIAGNOSIS — M1990 Unspecified osteoarthritis, unspecified site: Secondary | ICD-10-CM | POA: Diagnosis not present

## 2021-05-23 DIAGNOSIS — Z23 Encounter for immunization: Secondary | ICD-10-CM | POA: Diagnosis not present

## 2021-06-05 ENCOUNTER — Ambulatory Visit (INDEPENDENT_AMBULATORY_CARE_PROVIDER_SITE_OTHER): Payer: Medicare Other

## 2021-06-05 DIAGNOSIS — I639 Cerebral infarction, unspecified: Secondary | ICD-10-CM | POA: Diagnosis not present

## 2021-06-07 LAB — CUP PACEART REMOTE DEVICE CHECK
Date Time Interrogation Session: 20221012230926
Implantable Pulse Generator Implant Date: 20201216

## 2021-06-14 NOTE — Progress Notes (Signed)
[facility] Summary Report / Loop Recorder 

## 2021-06-19 DIAGNOSIS — I2510 Atherosclerotic heart disease of native coronary artery without angina pectoris: Secondary | ICD-10-CM | POA: Diagnosis not present

## 2021-06-19 DIAGNOSIS — I10 Essential (primary) hypertension: Secondary | ICD-10-CM | POA: Diagnosis not present

## 2021-06-19 DIAGNOSIS — M1990 Unspecified osteoarthritis, unspecified site: Secondary | ICD-10-CM | POA: Diagnosis not present

## 2021-06-19 DIAGNOSIS — E785 Hyperlipidemia, unspecified: Secondary | ICD-10-CM | POA: Diagnosis not present

## 2021-07-12 DIAGNOSIS — D1801 Hemangioma of skin and subcutaneous tissue: Secondary | ICD-10-CM | POA: Diagnosis not present

## 2021-07-12 DIAGNOSIS — L814 Other melanin hyperpigmentation: Secondary | ICD-10-CM | POA: Diagnosis not present

## 2021-07-12 DIAGNOSIS — D225 Melanocytic nevi of trunk: Secondary | ICD-10-CM | POA: Diagnosis not present

## 2021-07-12 DIAGNOSIS — Z85820 Personal history of malignant melanoma of skin: Secondary | ICD-10-CM | POA: Diagnosis not present

## 2021-07-12 DIAGNOSIS — L821 Other seborrheic keratosis: Secondary | ICD-10-CM | POA: Diagnosis not present

## 2021-07-12 DIAGNOSIS — L570 Actinic keratosis: Secondary | ICD-10-CM | POA: Diagnosis not present

## 2021-07-12 DIAGNOSIS — Z85828 Personal history of other malignant neoplasm of skin: Secondary | ICD-10-CM | POA: Diagnosis not present

## 2021-07-24 DIAGNOSIS — I10 Essential (primary) hypertension: Secondary | ICD-10-CM | POA: Diagnosis not present

## 2021-07-24 DIAGNOSIS — E785 Hyperlipidemia, unspecified: Secondary | ICD-10-CM | POA: Diagnosis not present

## 2021-07-24 DIAGNOSIS — Z125 Encounter for screening for malignant neoplasm of prostate: Secondary | ICD-10-CM | POA: Diagnosis not present

## 2021-07-24 DIAGNOSIS — R7301 Impaired fasting glucose: Secondary | ICD-10-CM | POA: Diagnosis not present

## 2021-07-31 DIAGNOSIS — I10 Essential (primary) hypertension: Secondary | ICD-10-CM | POA: Diagnosis not present

## 2021-07-31 DIAGNOSIS — Z0000 Encounter for general adult medical examination without abnormal findings: Secondary | ICD-10-CM | POA: Diagnosis not present

## 2021-07-31 DIAGNOSIS — I6529 Occlusion and stenosis of unspecified carotid artery: Secondary | ICD-10-CM | POA: Diagnosis not present

## 2021-07-31 DIAGNOSIS — R82998 Other abnormal findings in urine: Secondary | ICD-10-CM | POA: Diagnosis not present

## 2021-07-31 DIAGNOSIS — I6340 Cerebral infarction due to embolism of unspecified cerebral artery: Secondary | ICD-10-CM | POA: Diagnosis not present

## 2021-07-31 DIAGNOSIS — M8589 Other specified disorders of bone density and structure, multiple sites: Secondary | ICD-10-CM | POA: Diagnosis not present

## 2021-07-31 DIAGNOSIS — E785 Hyperlipidemia, unspecified: Secondary | ICD-10-CM | POA: Diagnosis not present

## 2021-07-31 DIAGNOSIS — I2510 Atherosclerotic heart disease of native coronary artery without angina pectoris: Secondary | ICD-10-CM | POA: Diagnosis not present

## 2021-07-31 DIAGNOSIS — E291 Testicular hypofunction: Secondary | ICD-10-CM | POA: Diagnosis not present

## 2021-07-31 DIAGNOSIS — R7301 Impaired fasting glucose: Secondary | ICD-10-CM | POA: Diagnosis not present

## 2021-07-31 DIAGNOSIS — D6862 Lupus anticoagulant syndrome: Secondary | ICD-10-CM | POA: Diagnosis not present

## 2021-08-03 DIAGNOSIS — Z23 Encounter for immunization: Secondary | ICD-10-CM | POA: Diagnosis not present

## 2021-08-10 LAB — CUP PACEART REMOTE DEVICE CHECK
Date Time Interrogation Session: 20221217231131
Implantable Pulse Generator Implant Date: 20201216

## 2021-08-16 ENCOUNTER — Ambulatory Visit (INDEPENDENT_AMBULATORY_CARE_PROVIDER_SITE_OTHER): Payer: Medicare Other

## 2021-08-16 DIAGNOSIS — I639 Cerebral infarction, unspecified: Secondary | ICD-10-CM

## 2021-08-19 ENCOUNTER — Encounter (HOSPITAL_BASED_OUTPATIENT_CLINIC_OR_DEPARTMENT_OTHER): Payer: Self-pay | Admitting: *Deleted

## 2021-08-19 ENCOUNTER — Inpatient Hospital Stay (HOSPITAL_COMMUNITY)
Admission: EM | Admit: 2021-08-19 | Discharge: 2021-08-21 | DRG: 291 | Disposition: A | Discharge status: Home / Self Care | Payer: Medicare Other | Attending: Internal Medicine | Admitting: Internal Medicine

## 2021-08-19 ENCOUNTER — Encounter (HOSPITAL_COMMUNITY): Payer: Self-pay | Admitting: Emergency Medicine

## 2021-08-19 ENCOUNTER — Emergency Department (HOSPITAL_BASED_OUTPATIENT_CLINIC_OR_DEPARTMENT_OTHER)
Admission: EM | Admit: 2021-08-19 | Discharge: 2021-08-19 | Disposition: A | Discharge status: Home / Self Care | Payer: Medicare Other | Source: Home / Self Care | Attending: Emergency Medicine | Admitting: Emergency Medicine

## 2021-08-19 ENCOUNTER — Other Ambulatory Visit: Payer: Self-pay

## 2021-08-19 ENCOUNTER — Emergency Department (HOSPITAL_BASED_OUTPATIENT_CLINIC_OR_DEPARTMENT_OTHER): Payer: Medicare Other | Admitting: Radiology

## 2021-08-19 DIAGNOSIS — Z8249 Family history of ischemic heart disease and other diseases of the circulatory system: Secondary | ICD-10-CM

## 2021-08-19 DIAGNOSIS — I110 Hypertensive heart disease with heart failure: Secondary | ICD-10-CM | POA: Diagnosis not present

## 2021-08-19 DIAGNOSIS — Z96651 Presence of right artificial knee joint: Secondary | ICD-10-CM | POA: Insufficient documentation

## 2021-08-19 DIAGNOSIS — Z951 Presence of aortocoronary bypass graft: Secondary | ICD-10-CM | POA: Insufficient documentation

## 2021-08-19 DIAGNOSIS — I10 Essential (primary) hypertension: Secondary | ICD-10-CM | POA: Diagnosis present

## 2021-08-19 DIAGNOSIS — Z83438 Family history of other disorder of lipoprotein metabolism and other lipidemia: Secondary | ICD-10-CM

## 2021-08-19 DIAGNOSIS — J811 Chronic pulmonary edema: Secondary | ICD-10-CM | POA: Diagnosis not present

## 2021-08-19 DIAGNOSIS — R9431 Abnormal electrocardiogram [ECG] [EKG]: Secondary | ICD-10-CM | POA: Insufficient documentation

## 2021-08-19 DIAGNOSIS — J069 Acute upper respiratory infection, unspecified: Secondary | ICD-10-CM | POA: Diagnosis present

## 2021-08-19 DIAGNOSIS — Z8673 Personal history of transient ischemic attack (TIA), and cerebral infarction without residual deficits: Secondary | ICD-10-CM | POA: Diagnosis not present

## 2021-08-19 DIAGNOSIS — I639 Cerebral infarction, unspecified: Secondary | ICD-10-CM | POA: Diagnosis present

## 2021-08-19 DIAGNOSIS — I25810 Atherosclerosis of coronary artery bypass graft(s) without angina pectoris: Secondary | ICD-10-CM | POA: Diagnosis not present

## 2021-08-19 DIAGNOSIS — D6861 Antiphospholipid syndrome: Secondary | ICD-10-CM | POA: Diagnosis present

## 2021-08-19 DIAGNOSIS — E785 Hyperlipidemia, unspecified: Secondary | ICD-10-CM | POA: Diagnosis present

## 2021-08-19 DIAGNOSIS — I2510 Atherosclerotic heart disease of native coronary artery without angina pectoris: Secondary | ICD-10-CM | POA: Diagnosis present

## 2021-08-19 DIAGNOSIS — Z20822 Contact with and (suspected) exposure to covid-19: Secondary | ICD-10-CM | POA: Insufficient documentation

## 2021-08-19 DIAGNOSIS — R059 Cough, unspecified: Secondary | ICD-10-CM | POA: Diagnosis not present

## 2021-08-19 DIAGNOSIS — I700 Atherosclerosis of aorta: Secondary | ICD-10-CM | POA: Diagnosis not present

## 2021-08-19 DIAGNOSIS — R0602 Shortness of breath: Secondary | ICD-10-CM | POA: Diagnosis not present

## 2021-08-19 DIAGNOSIS — R0789 Other chest pain: Secondary | ICD-10-CM | POA: Insufficient documentation

## 2021-08-19 DIAGNOSIS — I6529 Occlusion and stenosis of unspecified carotid artery: Secondary | ICD-10-CM | POA: Diagnosis present

## 2021-08-19 DIAGNOSIS — Z79899 Other long term (current) drug therapy: Secondary | ICD-10-CM | POA: Diagnosis not present

## 2021-08-19 DIAGNOSIS — I509 Heart failure, unspecified: Secondary | ICD-10-CM | POA: Diagnosis not present

## 2021-08-19 DIAGNOSIS — Z974 Presence of external hearing-aid: Secondary | ICD-10-CM

## 2021-08-19 DIAGNOSIS — Z888 Allergy status to other drugs, medicaments and biological substances status: Secondary | ICD-10-CM

## 2021-08-19 DIAGNOSIS — Z7982 Long term (current) use of aspirin: Secondary | ICD-10-CM

## 2021-08-19 DIAGNOSIS — R918 Other nonspecific abnormal finding of lung field: Secondary | ICD-10-CM | POA: Diagnosis not present

## 2021-08-19 DIAGNOSIS — Z87442 Personal history of urinary calculi: Secondary | ICD-10-CM | POA: Diagnosis not present

## 2021-08-19 DIAGNOSIS — I5033 Acute on chronic diastolic (congestive) heart failure: Secondary | ICD-10-CM | POA: Diagnosis not present

## 2021-08-19 DIAGNOSIS — I248 Other forms of acute ischemic heart disease: Secondary | ICD-10-CM | POA: Diagnosis present

## 2021-08-19 DIAGNOSIS — R0902 Hypoxemia: Secondary | ICD-10-CM | POA: Diagnosis not present

## 2021-08-19 DIAGNOSIS — J929 Pleural plaque without asbestos: Secondary | ICD-10-CM | POA: Diagnosis not present

## 2021-08-19 LAB — CBC WITH DIFFERENTIAL/PLATELET
Abs Immature Granulocytes: 0.01 10*3/uL (ref 0.00–0.07)
Abs Immature Granulocytes: 0.03 10*3/uL (ref 0.00–0.07)
Basophils Absolute: 0 10*3/uL (ref 0.0–0.1)
Basophils Absolute: 0 10*3/uL (ref 0.0–0.1)
Basophils Relative: 0 %
Basophils Relative: 0 %
Eosinophils Absolute: 0.2 10*3/uL (ref 0.0–0.5)
Eosinophils Absolute: 0.2 10*3/uL (ref 0.0–0.5)
Eosinophils Relative: 2 %
Eosinophils Relative: 2 %
HCT: 40.4 % (ref 39.0–52.0)
HCT: 43.2 % (ref 39.0–52.0)
Hemoglobin: 13.5 g/dL (ref 13.0–17.0)
Hemoglobin: 14.3 g/dL (ref 13.0–17.0)
Immature Granulocytes: 0 %
Immature Granulocytes: 0 %
Lymphocytes Relative: 25 %
Lymphocytes Relative: 18 %
Lymphs Abs: 1.6 10*3/uL (ref 0.7–4.0)
Lymphs Abs: 1.2 10*3/uL (ref 0.7–4.0)
MCH: 31.3 pg (ref 26.0–34.0)
MCH: 31.3 pg (ref 26.0–34.0)
MCHC: 33.4 g/dL (ref 30.0–36.0)
MCHC: 33.1 g/dL (ref 30.0–36.0)
MCV: 93.5 fL (ref 80.0–100.0)
MCV: 94.5 fL (ref 80.0–100.0)
Monocytes Absolute: 1 10*3/uL (ref 0.1–1.0)
Monocytes Absolute: 1.1 10*3/uL — ABNORMAL HIGH (ref 0.1–1.0)
Monocytes Relative: 15 %
Monocytes Relative: 16 %
Neutro Abs: 3.7 10*3/uL (ref 1.7–7.7)
Neutro Abs: 4.3 10*3/uL (ref 1.7–7.7)
Neutrophils Relative %: 58 %
Neutrophils Relative %: 64 %
Platelets: 187 10*3/uL (ref 150–400)
Platelets: 222 10*3/uL (ref 150–400)
RBC: 4.32 MIL/uL (ref 4.22–5.81)
RBC: 4.57 MIL/uL (ref 4.22–5.81)
RDW: 13.7 % (ref 11.5–15.5)
RDW: 13.5 % (ref 11.5–15.5)
WBC: 6.4 10*3/uL (ref 4.0–10.5)
WBC: 6.8 10*3/uL (ref 4.0–10.5)
nRBC: 0 % (ref 0.0–0.2)
nRBC: 0 % (ref 0.0–0.2)

## 2021-08-19 LAB — COMPREHENSIVE METABOLIC PANEL
ALT: 14 U/L (ref 0–44)
AST: 21 U/L (ref 15–41)
Albumin: 3.6 g/dL (ref 3.5–5.0)
Alkaline Phosphatase: 69 U/L (ref 38–126)
Anion gap: 10 (ref 5–15)
BUN: 13 mg/dL (ref 8–23)
CO2: 26 mmol/L (ref 22–32)
Calcium: 8.4 mg/dL — ABNORMAL LOW (ref 8.9–10.3)
Chloride: 101 mmol/L (ref 98–111)
Creatinine, Ser: 0.74 mg/dL (ref 0.61–1.24)
GFR, Estimated: >60 mL/min (ref >60)
Glucose, Bld: 76 mg/dL (ref 70–99)
Potassium: 3.9 mmol/L (ref 3.5–5.1)
Sodium: 137 mmol/L (ref 135–145)
Total Bilirubin: 0.8 mg/dL (ref 0.3–1.2)
Total Protein: 6.4 g/dL — ABNORMAL LOW (ref 6.5–8.1)

## 2021-08-19 LAB — URINALYSIS, ROUTINE W REFLEX MICROSCOPIC
Bilirubin Urine: NEGATIVE
Glucose, UA: NEGATIVE mg/dL
Hgb urine dipstick: NEGATIVE
Ketones, ur: 40 mg/dL — AB
Leukocytes,Ua: NEGATIVE
Nitrite: NEGATIVE
Protein, ur: NEGATIVE mg/dL
Specific Gravity, Urine: 1.007 (ref 1.005–1.030)
pH: 5.5 (ref 5.0–8.0)

## 2021-08-19 LAB — BRAIN NATRIURETIC PEPTIDE: B Natriuretic Peptide: 646.2 pg/mL — ABNORMAL HIGH (ref 0.0–100.0)

## 2021-08-19 LAB — TROPONIN I (HIGH SENSITIVITY)
Troponin I (High Sensitivity): 22 ng/L — ABNORMAL HIGH (ref <18)
Troponin I (High Sensitivity): 21 ng/L — ABNORMAL HIGH (ref <18)
Troponin I (High Sensitivity): 33 ng/L — ABNORMAL HIGH (ref <18)

## 2021-08-19 LAB — BASIC METABOLIC PANEL
Anion gap: 8 (ref 5–15)
BUN: 17 mg/dL (ref 8–23)
CO2: 27 mmol/L (ref 22–32)
Calcium: 8.4 mg/dL — ABNORMAL LOW (ref 8.9–10.3)
Chloride: 101 mmol/L (ref 98–111)
Creatinine, Ser: 1.04 mg/dL (ref 0.61–1.24)
GFR, Estimated: >60 mL/min (ref >60)
Glucose, Bld: 150 mg/dL — ABNORMAL HIGH (ref 70–99)
Potassium: 3.9 mmol/L (ref 3.5–5.1)
Sodium: 136 mmol/L (ref 135–145)

## 2021-08-19 LAB — RESP PANEL BY RT-PCR (FLU A&B, COVID) ARPGX2
Influenza A by PCR: NEGATIVE
Influenza B by PCR: NEGATIVE
SARS Coronavirus 2 by RT PCR: NEGATIVE

## 2021-08-19 LAB — LACTIC ACID, PLASMA: Lactic Acid, Venous: 0.8 mmol/L (ref 0.5–1.9)

## 2021-08-19 IMAGING — DX DG CHEST 2V
2 series · 2 of 2 positions shown · non-contrast
Comparison: CT chest [DATE]

CLINICAL DATA: Shortness of breath.  Cough.

EXAM:
CHEST - 2 VIEW

[chest pa]
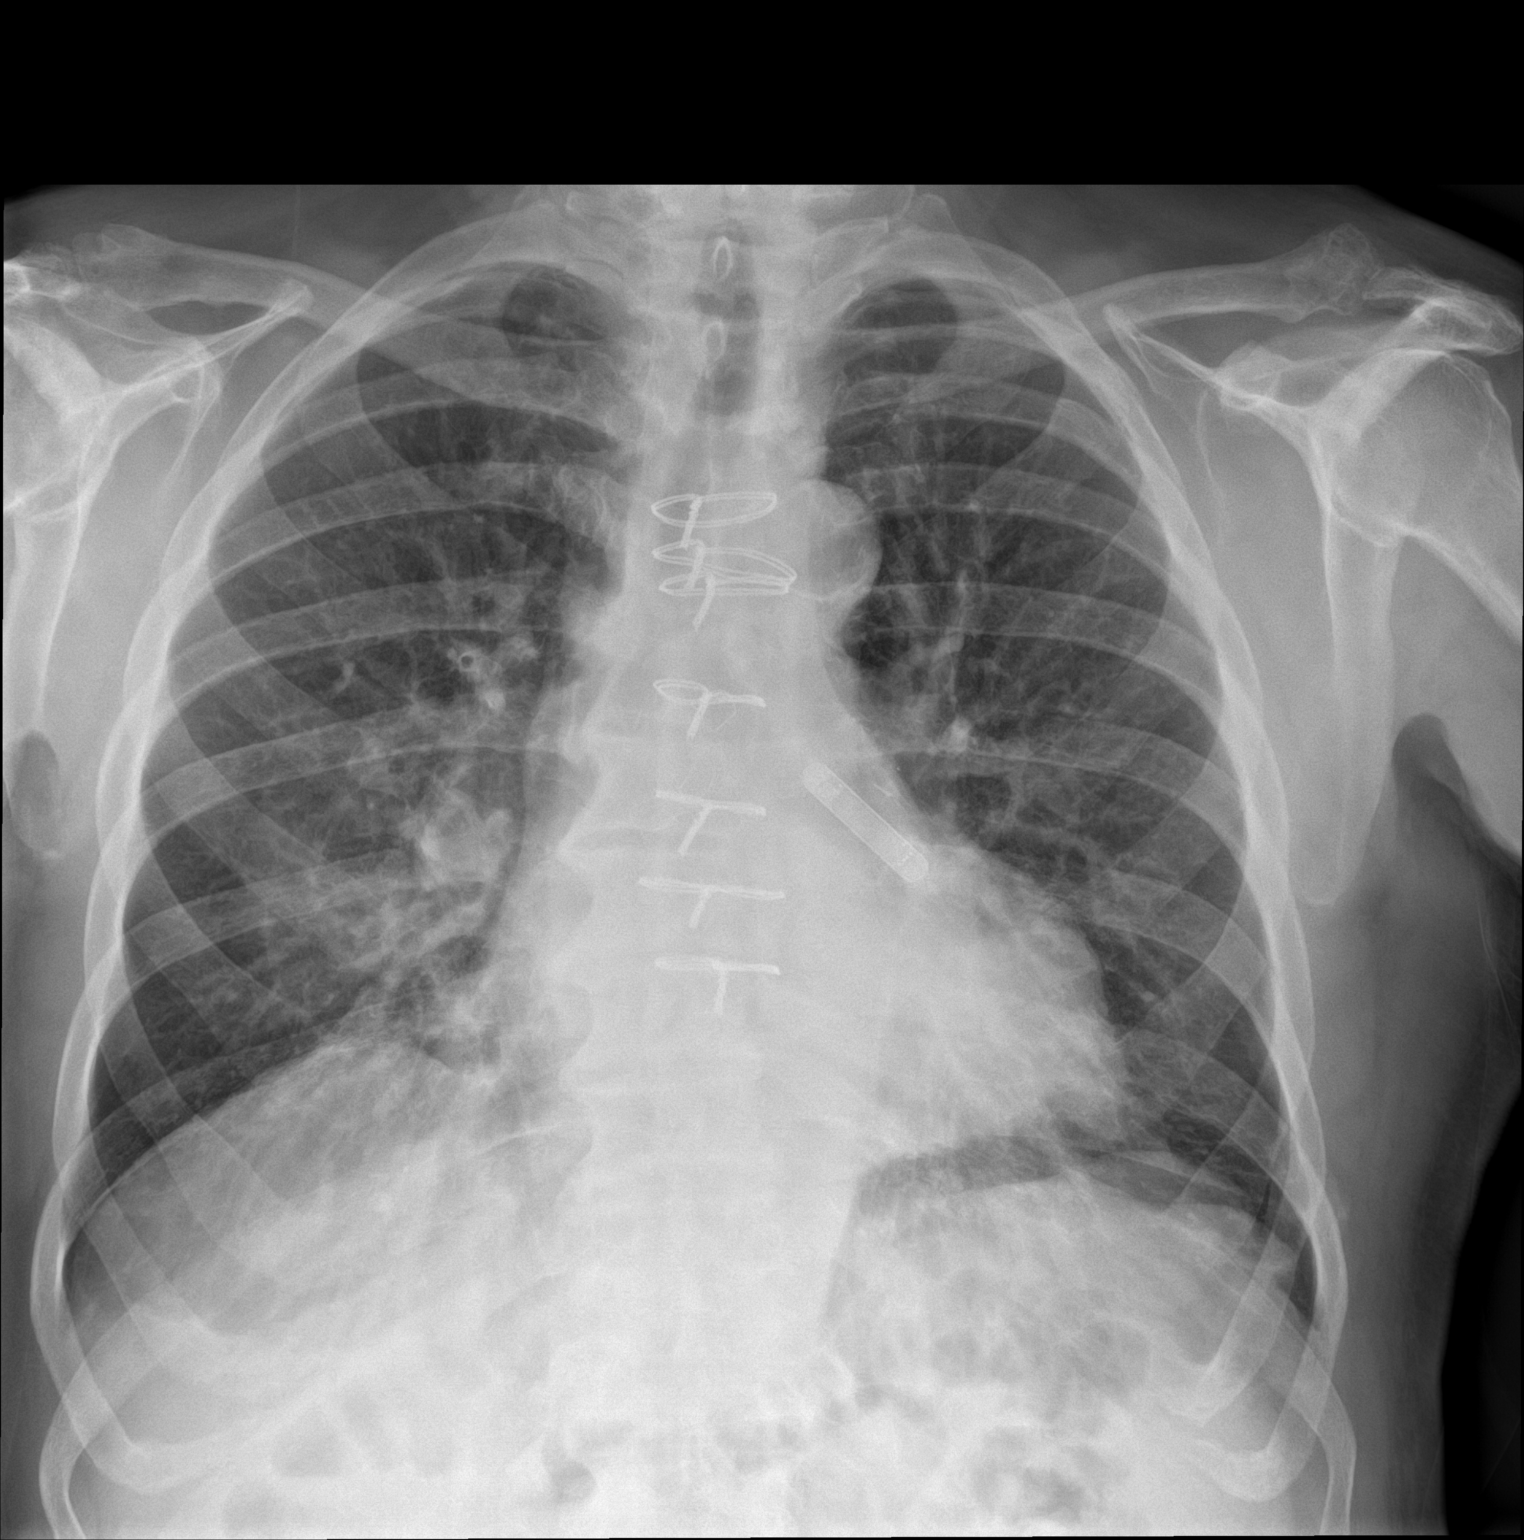

[chest lat]
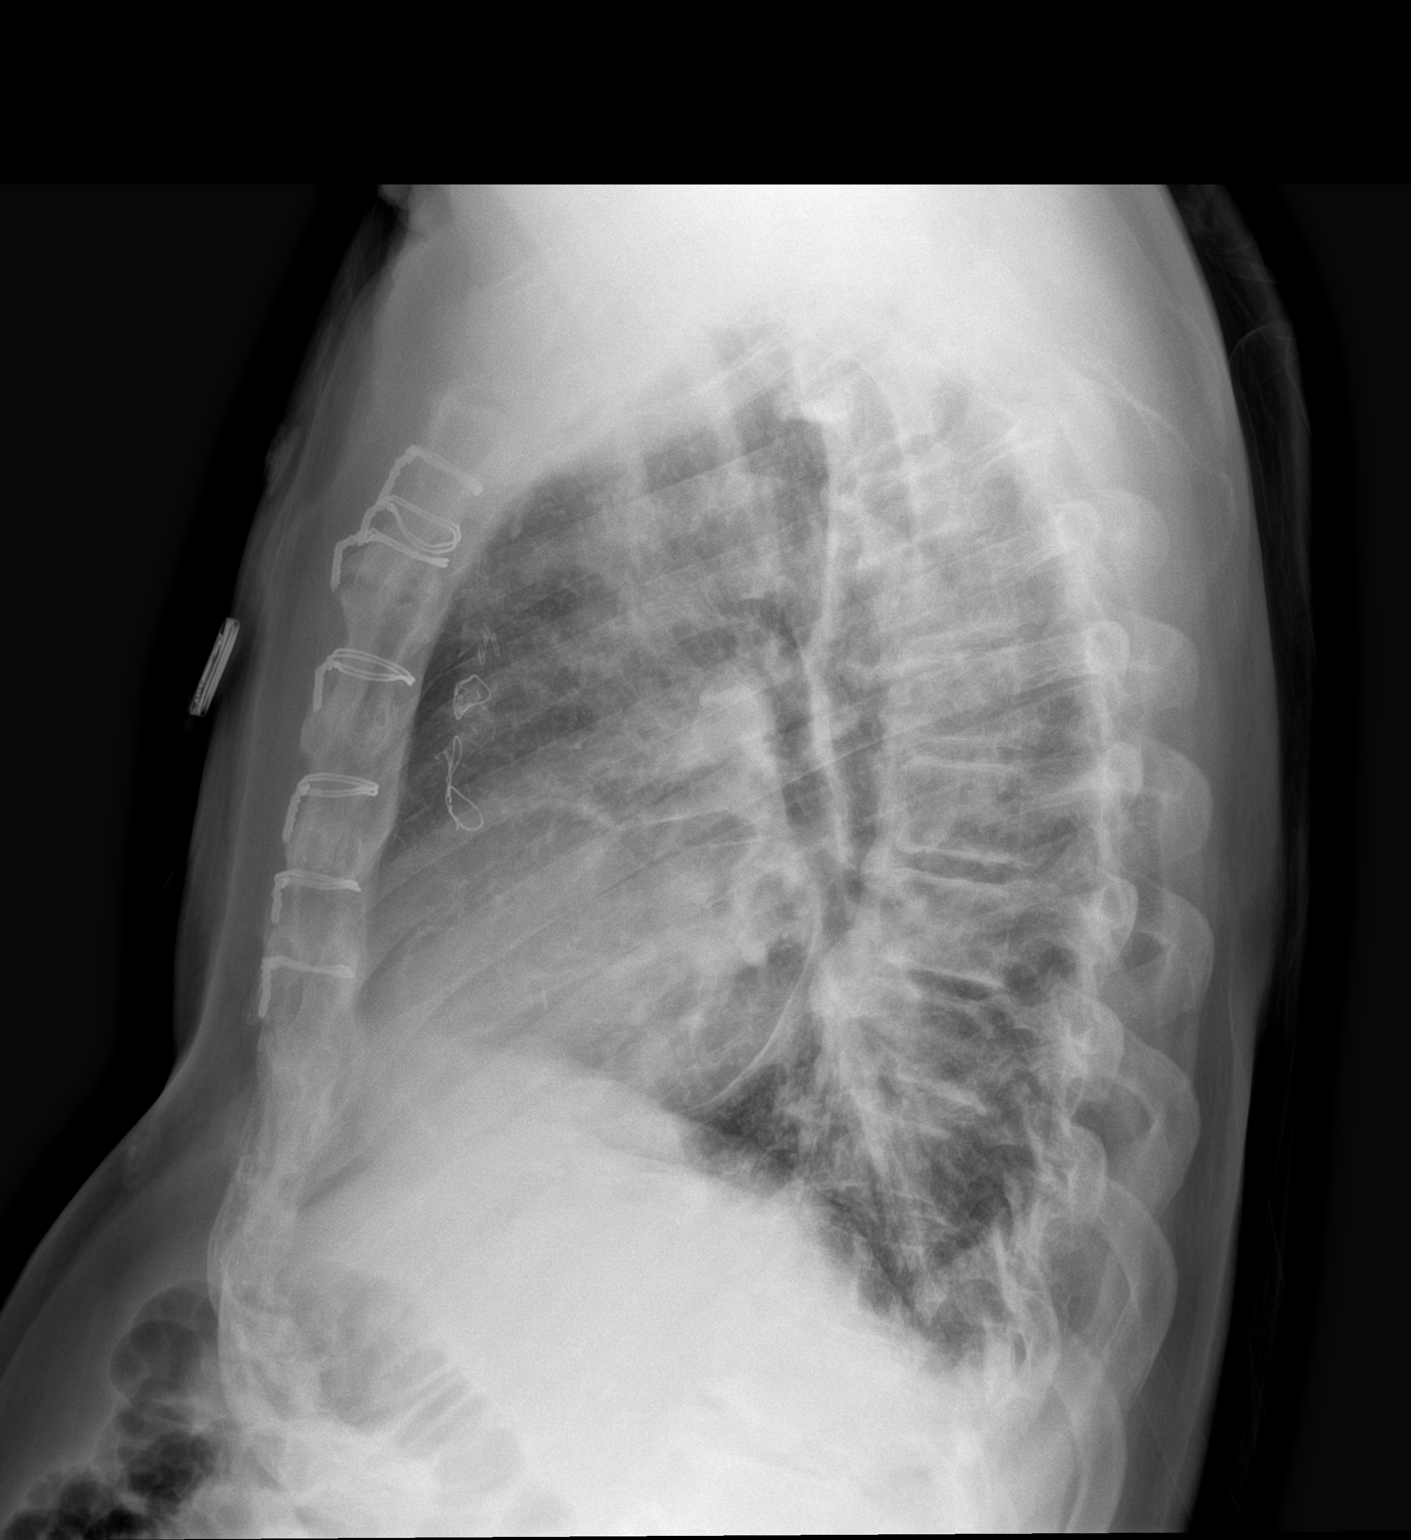

[2 of 2 positions shown; findings below may reference images not displayed]

FINDINGS: Normal heart size. Status post median sternotomy and CABG procedure.
A loop recorder is identified in the projection of the left heart
border. The lungs appear hyperinflated. Pulmonary vascular
congestion. No pleural effusion or interstitial edema. No airspace
consolidation. The visualized osseous structures appear intact.
IMPRESSION: Pulmonary vascular congestion.

## 2021-08-19 MED ORDER — FUROSEMIDE 10 MG/ML IJ SOLN
20.0000 mg | Freq: Once | INTRAMUSCULAR | Status: AC
  Administered 2021-08-19: 20 mg via INTRAVENOUS
  Filled 2021-08-19: qty 2

## 2021-08-19 MED ORDER — FUROSEMIDE 20 MG PO TABS: 20.0000 mg | ORAL_TABLET | Freq: Every day | ORAL | 0 refills | Status: DC

## 2021-08-19 NOTE — ED Provider Notes (Signed)
Emergency Medicine Provider Triage Evaluation Note  Jordan [name] , a 80 y.o. male  was evaluated in triage.  Pt complains of shortness of breath.  Patient was seen at [facility] earlier today, plan was for patient to be admitted to the hospital service for echo with cardiology to consult however patient wanted to be seen by his cardiologist and opted not to be admitted at that time.  After leaving the hospital, patient had another episode of shortness of breath and patient and his wife decided to come back to the emergency room for recheck.  Review of Systems  Positive: SHOB, cough   Negative: CP  Physical Exam  BP (!) 148/80 (BP Location: Right Arm)    Pulse 84    Resp 20    SpO2 92%  Gen:   Awake, no distress   Resp:  Normal effort  MSK:   Moves extremities without difficulty  Other:    Medical Decision Making  Medically screening exam initiated at 9:56 PM.  Appropriate orders placed.  Jordan [name] was informed that the remainder of the evaluation will be completed by another provider, this initial triage assessment does not replace that evaluation, and the importance of remaining in the ED until their evaluation is complete.     Tacy Learn, PA-C 08/19/21 [id number]    Sherwood Gambler, MD 08/20/21 [phone]

## 2021-08-19 NOTE — ED Provider Notes (Signed)
[facility] EMERGENCY DEPARTMENT Provider Note   CSN: [id number] Arrival date & time: 08/19/21  2142     History Chief Complaint  Patient presents with   Shortness of Breath    Jordan [name] is a 80 y.o. male.  80 y.o. male  was evaluated in triage.  Pt complains of shortness of breath.  Patient was seen at [facility] ER earlier today, plan was for patient to be admitted to the hospital service with elevated BNP after having received Lasix for echo with cardiology to consult however patient wanted to be seen by his cardiologist and opted not to be admitted at that time.  After leaving the hospital, patient had another episode of shortness of breath and patient and his wife decided to come back to the emergency room for recheck. Denies chest pain.      Past Medical History:  Diagnosis Date   Abnormal nuclear stress test 03/05/2017   Bibasilar crackles 02/26/2017   Carotid stenosis, asymptomatic 07/27/2011   DDD (degenerative disc disease)    Dyspnea    Dyspnea on exertion 01/28/2017   ED (erectile dysfunction)    Essential hypertension 01/29/2017   GERD (gastroesophageal reflux disease)    History of hiatal hernia    History of kidney stones    passed stones - no surgery required   History of wheezing 02/26/2017   HTN (hypertension)    Hyperlipidemia    Hyperlipidemia with target low density lipoprotein (LDL) cholesterol less than 70 mg/dL 03/31/2014   Impaired fasting glucose    Knee pain    bilateral   Left main coronary artery disease 03/18/2017   Cardiac Cath: 55% dLM, tandem p-mLAD 75% --> referred for CABG.   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 Cyndia Bent @ [facility]):  LIMA-LAD, SVG-D2, SVG-OM, SVG-PDA (endoscopic SVG harvesting from right leg).   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   Left main coronary artery disease 03/18/2017   Abnormal nuclear stress test 03/05/2017   Mediastinal adenopathy 02/26/2017   Essential hypertension 01/29/2017   Dyspnea on exertion 01/28/2017   Hyperlipidemia with target low density lipoprotein (LDL) cholesterol less than 70 mg/dL 03/31/2014   GERD (gastroesophageal reflux disease) 03/31/2014   ED (erectile dysfunction) 03/31/2014   Carotid stenosis, asymptomatic 07/27/2011    Past Surgical History:  Procedure Laterality Date   APPENDECTOMY     2020   BUBBLE STUDY  08/05/2019   Procedure: BUBBLE STUDY;  Surgeon: Buford Dresser, MD;  Location: [place];  Service: Cardiovascular;;   Cardiac Stress Test  2008   Normal   CARPAL TUNNEL RELEASE Left [place] Right 2008   CHOLECYSTECTOMY, LAPAROSCOPIC  2021   in [place]  06/2013   Henrene Pastor - polyps   CORONARY ARTERY BYPASS GRAFT N/A 04/04/2017   Procedure: CORONARY ARTERY BYPASS GRAFTING x 4  LIMA-LAD, SVG-D2, SVG-OM, SVG-PDA (endoscopic SVG harvesting from right leg);  Surgeon: Gilford Raid  K, MD;  Location: [place];  Service: Open Heart Surgery;  Laterality: N/A;   EXTRACORPOREAL SHOCK WAVE LITHOTRIPSY Right 10/23/2018   Procedure: EXTRACORPOREAL SHOCK WAVE LITHOTRIPSY (ESWL);  Surgeon: Bjorn Loser, MD;  Location: [facility];  Service: Urology;  Laterality: Right;   HERNIA REPAIR Left    inguinal   HIATAL HERNIA REPAIR     no surgery per pt   KNEE CARTILAGE SURGERY Bilateral    LEFT HEART CATH AND CORONARY ANGIOGRAPHY N/A 03/18/2017   Procedure: Left Heart Cath and Coronary Angiography;  Surgeon: Leonie Man, MD;  Location: [place] [facility];  Service: Cardiovascular:  55%  dLM, p-mLAD 75% - mLAD 75%.  EF 55-65%. Mod non-obstructive RCA disease --> referred for CABG   LOOP RECORDER INSERTION N/A 08/05/2019   Procedure: LOOP RECORDER INSERTION;  Surgeon: Constance Haw, MD;  Location: [place] [facility];  Service: Cardiovascular;  Laterality: N/A;   MEDIAL PARTIAL KNEE REPLACEMENT Right 2010   Partial right knee replacement   NM MYOVIEW LTD  01/2017   INTERMEDIATE RISK: Exercise tolerance was good at 10 minutes, however, fatigue and dyspnea were reporte-d -> hypertensive response to exercise.  16mm Horizontal ST depressions noted during stress in the II, III, aVF, V6, V5 and V4 leads c/w ischemia.   Small siize, moderate severity perfusion defect  mid to distal anterior wall perfusion defect suggestive of ischemia.Marland Kitchen    ROTATOR CUFF REPAIR Left 2008   SHOULDER ARTHROSCOPY W/ ROTATOR CUFF REPAIR Right 06/15/2016   TEE WITHOUT CARDIOVERSION N/A 04/04/2017   Procedure: TRANSESOPHAGEAL ECHOCARDIOGRAM (TEE);  Surgeon: Gaye Pollack, MD;  Location: [place];  Service: Open Heart Surgery;  Laterality: N/A;   TEE WITHOUT CARDIOVERSION N/A 08/05/2019   Procedure: TRANSESOPHAGEAL ECHOCARDIOGRAM (TEE);  Surgeon: Buford Dresser, MD;  Location: [facility];  Service: Cardiovascular;  Laterality: N/A;   TONSILLECTOMY         Family History  Problem Relation Age of Onset   Heart failure Mother    Cancer Mother        breast   Coronary artery disease Father        CABG   Heart attack Father        Long-term smoker.   Hyperlipidemia Father    Hyperlipidemia Brother    Hypertension Brother    Hyperlipidemia Son    Healthy Daughter    Colon cancer Neg Hx    Colon polyps Neg Hx    Rectal cancer Neg Hx    Stomach cancer Neg Hx     Social History   Tobacco Use   Smoking status: Never   Smokeless tobacco: Never  Vaping Use   Vaping Use: Never used  Substance Use Topics   Alcohol use: Yes    Comment: occasionally   Drug use: No    Home  Medications Prior to Admission medications   Medication Sig Start Date End Date Taking? Authorizing Provider  acetaminophen (TYLENOL) 500 MG tablet Take 500 mg by mouth 2 (two) times daily.    [provider]  aspirin EC 81 MG tablet Take 81 mg by mouth daily.    [provider]  Cholecalciferol (VITAMIN D3) 250 MCG (10000 UT) capsule Take 10,000 Units by mouth 2 (two) times daily.    [provider]  COVID-19 mRNA Vac-TriS, Pfizer, (PFIZER-BIONT COVID-19 VAC-TRIS) SUSP injection Inject into the muscle. 12/15/20   Carlyle Basques, MD  diclofenac Sodium (VOLTAREN) 1 % GEL Apply 1 application topically 4 (four)  times daily as needed (shoulder pain).    [provider]  EPINEPHrine 0.3 mg/0.3 mL IJ SOAJ injection Inject 0.3 mg into the muscle as needed for anaphylaxis.  05/06/19   [provider]  ezetimibe (ZETIA) 10 MG tablet Take 10 mg by mouth daily.     [provider]  famotidine (PEPCID) 10 MG tablet Take 10 mg by mouth 2 (two) times daily. Patient not taking: Reported on 01/25/2021    [provider]  furosemide (LASIX) 20 MG tablet Take 1 tablet (20 mg total) by mouth daily for 4 days. 08/19/21 08/23/21  Deno Etienne, DO  Menthol (ICY HOT BACK) 5 % PTCH Apply 1 patch topically daily as needed (pain).    [provider]  Menthol, Topical Analgesic, (ICY HOT BACK EXTRA STRENGTH EX) Apply 1 application topically daily as needed (pain).    [provider]  Multiple Vitamin (MULTIVITAMIN) tablet Take 1 tablet by mouth daily.    [provider]  OVER THE COUNTER MEDICATION Take 1 Dose by mouth 2 (two) times daily as needed. CBD oil    [provider]  pravastatin (PRAVACHOL) 20 MG tablet Take 20 mg by mouth every evening.  10/18/18   [provider]  tadalafil (ADCIRCA/CIALIS) 20 MG tablet Take 20 mg by mouth daily as needed for erectile dysfunction.    [provider]  testosterone  (ANDROGEL) 50 MG/5GM (1%) GEL Place 5 g onto the skin daily.    [provider]  valsartan (DIOVAN) 80 MG tablet Take 1 tablet by mouth daily. 03/21/20   [provider]    Allergies    Nexium [esomeprazole] and Crestor [rosuvastatin calcium]  Review of Systems   Review of Systems  Constitutional:  Positive for chills and fatigue. Negative for fever.  HENT:  Positive for congestion.   Respiratory:  Positive for cough and shortness of breath.   Cardiovascular:  Negative for chest pain.  Gastrointestinal:  Negative for abdominal pain, nausea and vomiting.  Genitourinary:  Negative for decreased urine volume and difficulty urinating.  Skin:  Negative for rash and wound.  Allergic/Immunologic: Negative for immunocompromised state.  Neurological:  Negative for weakness.  All other systems reviewed and are negative.  Physical Exam Updated Vital Signs BP (!) 148/80 (BP Location: Right Arm)    Pulse 84    Resp 20    SpO2 92%   Physical Exam Vitals and nursing note reviewed.  Constitutional:      General: He is not in acute distress.    Appearance: He is well-developed. He is not diaphoretic.  HENT:     Head: Normocephalic and atraumatic.  Cardiovascular:     Rate and Rhythm: Normal rate and regular rhythm.  Pulmonary:     Effort: Tachypnea present.     Breath sounds: Examination of the right-lower field reveals decreased breath sounds. Examination of the left-lower field reveals decreased breath sounds. Decreased breath sounds present.  Abdominal:     Palpations: Abdomen is soft.  Musculoskeletal:     Right lower leg: No edema.     Left lower leg: No edema.  Skin:    General: Skin is warm and dry.     Findings: No erythema or rash.  Neurological:     Mental Status: He is alert and oriented to person, place, and time.  Psychiatric:        Behavior: Behavior normal.    ED Results / Procedures / Treatments   Labs (all labs  ordered are listed, but only abnormal  results are displayed) Labs Reviewed  CBC WITH DIFFERENTIAL/PLATELET - Abnormal; Notable for the following components:      Result Value   Monocytes Absolute 1.1 (*)    All other components within normal limits  BASIC METABOLIC PANEL  TROPONIN I (HIGH SENSITIVITY)    EKG None  Radiology DG Chest 2 View  Result Date: 08/19/2021 CLINICAL DATA:  Shortness of breath.  Cough. EXAM: CHEST - 2 VIEW COMPARISON:  CT chest 07/24/2019 FINDINGS: Normal heart size. Status post median sternotomy and CABG procedure. A loop recorder is identified in the projection of the left heart border. The lungs appear hyperinflated. Pulmonary vascular congestion. No pleural effusion or interstitial edema. No airspace consolidation. The visualized osseous structures appear intact. IMPRESSION: Pulmonary vascular congestion. Electronically Signed   By: Kerby Moors M.D.   On: 08/19/2021 11:28    Procedures Procedures   Medications Ordered in ED Medications - No data to display  ED Course  I have reviewed the triage vital signs and the nursing notes.  Pertinent labs & imaging results that were available during my care of the patient were reviewed by me and considered in my medical decision making (see chart for details).  Clinical Course as of 08/19/21 2236  Sat Dec 31, 887  332 80 year old male returns emergency room with shortness of breath without chest pain.  Patient was given Lasix for his found to be volume overload with plan for admission to the hospital however declined at that time, return to the ER now agreeable with admission. Case discussed with Dr. Myna Hidalgo with [facility] hospitalist service who will consult for admission. [LM]    Clinical Course User Index [LM] Roque Lias   MDM Rules/Calculators/A&P                            Final Clinical Impression(s) / ED Diagnoses Final diagnoses:  Congestive heart failure, unspecified HF chronicity, unspecified heart failure type [facility])     Rx / DC Orders ED Discharge Orders     None        Tacy Learn, PA-C 08/19/21 2236    Sherwood Gambler, MD 08/20/21 [phone]

## 2021-08-19 NOTE — Progress Notes (Signed)
Patient Room air saturation=  @ Rest=94% Standing=94%  RT had the patient walk while on room air Saturation while walking=98% Post walking=97%

## 2021-08-19 NOTE — ED Notes (Signed)
Presents with a 7 day hx of cough, prod cough, dark gray/green in color. Denies having fevers.

## 2021-08-19 NOTE — ED Triage Notes (Signed)
Pt c/o shortness of breath, weight gain, congestion and cough x 1 week. Seen at [facility] earlier today for same.

## 2021-08-19 NOTE — ED Notes (Signed)
Patient is resting comfortably. 

## 2021-08-19 NOTE — ED Notes (Signed)
In to bedside to inform client that room assignment has been rec, pt stated that he would rather go home and speak with his primary MD on Monday, ED MD Ellin Mayhew) made aware of pt and wife decision

## 2021-08-19 NOTE — Discharge Instructions (Addendum)
Return for chest pain or worsening difficulty breathing or if you feel like you might pass out or pass out.

## 2021-08-19 NOTE — ED Notes (Signed)
Faint exp wheeze noted bilaterally at bases

## 2021-08-19 NOTE — ED Provider Notes (Addendum)
Received in signout from Dr. Sherry Ruffing.  Briefly 80 yo M with 7 days of cough, congestion, sob.  6lb weight gain over week, found to have elevated BNP, rales in bases, cxr viewed by me with increased pulmonary congestion.  No hx of heart failure.   Discussed with Dr. Debara Pickett, recommends hospitalist admission for echo and will consult in morning.   After the patient had some longer time to think about this he decided that he would rather go home.  I discussed with him at length this decision including risks of death or permanent disability patient like to go home at this time.  We will call his cardiologist in the next working day.  Short course of Lasix.     Deno Etienne, DO 08/19/21 [place], [place], DO 08/19/21 1729

## 2021-08-19 NOTE — ED Triage Notes (Signed)
Shortness of breath, coughing since Saturday. Tested for Covid at home x 2, negative.  Denies fever.

## 2021-08-19 NOTE — ED Provider Notes (Signed)
[facility] EMERGENCY DEPT Provider Note   CSN: [id number] Arrival date & time: 08/19/21  1607     History Chief Complaint  Patient presents with   Shortness of Breath    Jordan [name] is a 80 y.o. male.  The history is provided by the patient and medical records.  Shortness of Breath Severity:  Moderate Duration:  1 week Timing:  Constant Progression:  Worsening Chronicity:  New Context: URI   Relieved by:  Nothing Worsened by:  Exertion and coughing Associated symptoms: chest pain (tightness) and cough   Associated symptoms: no abdominal pain, no diaphoresis, no fever, no headaches, no neck pain, no rash, no syncope, no vomiting and no wheezing       Past Medical History:  Diagnosis Date   Abnormal nuclear stress test 03/05/2017   Bibasilar crackles 02/26/2017   Carotid stenosis, asymptomatic 07/27/2011   DDD (degenerative disc disease)    Dyspnea    Dyspnea on exertion 01/28/2017   ED (erectile dysfunction)    Essential hypertension 01/29/2017   GERD (gastroesophageal reflux disease)    History of hiatal hernia    History of kidney stones    passed stones - no surgery required   History of wheezing 02/26/2017   HTN (hypertension)    Hyperlipidemia    Hyperlipidemia with target low density lipoprotein (LDL) cholesterol less than 70 mg/dL 03/31/2014   Impaired fasting glucose    Knee pain    bilateral   Left main coronary artery disease 03/18/2017   Cardiac Cath: 55% dLM, tandem p-mLAD 75% --> referred for CABG.   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 Cyndia Bent @ [facility]):  LIMA-LAD, SVG-D2, SVG-OM, SVG-PDA (endoscopic SVG harvesting from right leg).   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   Left main coronary artery disease 03/18/2017   Abnormal nuclear stress test 03/05/2017   Mediastinal adenopathy 02/26/2017   Essential hypertension 01/29/2017   Dyspnea on exertion 01/28/2017   Hyperlipidemia with target low density lipoprotein (LDL) cholesterol less than 70 mg/dL 03/31/2014   GERD (gastroesophageal reflux disease) 03/31/2014   ED (erectile dysfunction) 03/31/2014   Carotid stenosis, asymptomatic 07/27/2011    Past Surgical History:  Procedure Laterality Date   APPENDECTOMY     2020   BUBBLE STUDY  08/05/2019   Procedure: BUBBLE STUDY;  Surgeon: Buford Dresser, MD;  Location: [place];  Service: Cardiovascular;;   Cardiac Stress Test  2008   Normal   CARPAL TUNNEL RELEASE Left [place] Right 2008   CHOLECYSTECTOMY, LAPAROSCOPIC  2021   in [place]  06/2013   Henrene Pastor - polyps   CORONARY ARTERY BYPASS GRAFT N/A 04/04/2017   Procedure: CORONARY ARTERY BYPASS GRAFTING x 4  LIMA-LAD, SVG-D2, SVG-OM, SVG-PDA (endoscopic SVG harvesting from right leg);  Surgeon: Gaye Pollack, MD;  Location: [place];  Service: Open Heart Surgery;  Laterality: N/A;   EXTRACORPOREAL SHOCK WAVE LITHOTRIPSY Right 10/23/2018   Procedure: EXTRACORPOREAL SHOCK WAVE LITHOTRIPSY (ESWL);  Surgeon: Bjorn Loser, MD;  Location: [facility];  Service: Urology;  Laterality: Right;   HERNIA REPAIR Left    inguinal   HIATAL HERNIA REPAIR     no surgery per pt   KNEE CARTILAGE SURGERY Bilateral    LEFT HEART CATH AND CORONARY ANGIOGRAPHY N/A 03/18/2017   Procedure: Left Heart Cath and Coronary Angiography;  Surgeon: Leonie Man, MD;  Location: [facility];  Service: Cardiovascular:  55% dLM, p-mLAD 75% - mLAD 75%.  EF 55-65%. Mod non-obstructive RCA disease --> referred for CABG   LOOP RECORDER INSERTION N/A 08/05/2019   Procedure: LOOP RECORDER INSERTION;  Surgeon: Constance Haw, MD;  Location: [facility] [facility];  Service: Cardiovascular;  Laterality: N/A;   MEDIAL PARTIAL KNEE REPLACEMENT Right 2010   Partial right knee replacement   NM [facility]  01/2017   INTERMEDIATE RISK: Exercise tolerance was good at 10 minutes, however, fatigue and dyspnea were reporte-d -> hypertensive response to exercise.  8mm Horizontal ST depressions noted during stress in the II, III, aVF, V6, V5 and V4 leads c/w ischemia.   Small siize, moderate severity perfusion defect  mid to distal anterior wall perfusion defect suggestive of ischemia.Marland Kitchen    ROTATOR CUFF REPAIR Left 2008   SHOULDER ARTHROSCOPY W/ ROTATOR CUFF REPAIR Right 06/15/2016   TEE WITHOUT CARDIOVERSION N/A 04/04/2017   Procedure: TRANSESOPHAGEAL ECHOCARDIOGRAM (TEE);  Surgeon: Gaye Pollack, MD;  Location: [place];  Service: Open Heart Surgery;  Laterality: N/A;   TEE WITHOUT CARDIOVERSION N/A 08/05/2019   Procedure: TRANSESOPHAGEAL ECHOCARDIOGRAM (TEE);  Surgeon: Buford Dresser, MD;  Location: [facility];  Service: Cardiovascular;  Laterality: N/A;   TONSILLECTOMY         Family History  Problem Relation Age of Onset   Heart failure Mother    Cancer Mother        breast   Coronary artery disease Father        CABG   Heart attack Father        Long-term smoker.   Hyperlipidemia Father    Hyperlipidemia Brother    Hypertension Brother    Hyperlipidemia Son    Healthy Daughter    Colon cancer Neg Hx    Colon polyps Neg Hx    Rectal cancer Neg Hx    Stomach cancer Neg Hx     Social History   Tobacco Use   Smoking status: Never   Smokeless tobacco: Never  Vaping Use   Vaping Use: Never used  Substance Use Topics   Alcohol use: Yes    Comment: occasionally   Drug use: No    Home Medications Prior to Admission medications   Medication Sig Start Date End Date Taking? Authorizing Provider  acetaminophen (TYLENOL) 500 MG tablet Take 500 mg by mouth 2 (two) times daily.    [provider]  aspirin EC 81  MG tablet Take 81 mg by mouth daily.    [provider]  Cholecalciferol (VITAMIN D3) 250 MCG (10000 UT) capsule Take 10,000 Units by mouth 2 (two) times daily.    [provider]  COVID-19 mRNA Vac-TriS, Pfizer, (PFIZER-BIONT COVID-19 VAC-TRIS) SUSP injection Inject into the muscle. 12/15/20   Carlyle Basques, MD  diclofenac Sodium (VOLTAREN) 1 % GEL Apply 1 application topically 4 (four) times daily as needed (shoulder pain).    [provider]  EPINEPHrine 0.3 mg/0.3 mL IJ SOAJ injection Inject 0.3 mg into the muscle as needed for anaphylaxis.  05/06/19   [provider]  ezetimibe (ZETIA) 10 MG tablet  Take 10 mg by mouth daily.     [provider]  famotidine (PEPCID) 10 MG tablet Take 10 mg by mouth 2 (two) times daily. Patient not taking: Reported on 01/25/2021    [provider]  Menthol (ICY HOT BACK) 5 % PTCH Apply 1 patch topically daily as needed (pain).    [provider]  Menthol, Topical Analgesic, (ICY HOT BACK EXTRA STRENGTH EX) Apply 1 application topically daily as needed (pain).    [provider]  Multiple Vitamin (MULTIVITAMIN) tablet Take 1 tablet by mouth daily.    [provider]  OVER THE COUNTER MEDICATION Take 1 Dose by mouth 2 (two) times daily as needed. CBD oil    [provider]  pravastatin (PRAVACHOL) 20 MG tablet Take 20 mg by mouth every evening.  10/18/18   [provider]  tadalafil (ADCIRCA/CIALIS) 20 MG tablet Take 20 mg by mouth daily as needed for erectile dysfunction.    [provider]  testosterone (ANDROGEL) 50 MG/5GM (1%) GEL Place 5 g onto the skin daily.    [provider]  valsartan (DIOVAN) 80 MG tablet Take 1 tablet by mouth daily. 03/21/20   [provider]    Allergies    Nexium [esomeprazole] and Crestor [rosuvastatin calcium]  Review of Systems   Review of Systems  Constitutional:  Positive for chills and fatigue. Negative  for diaphoresis and fever.  HENT:  Positive for congestion.   Respiratory:  Positive for cough, chest tightness and shortness of breath. Negative for wheezing and stridor.   Cardiovascular:  Positive for chest pain (tightness). Negative for palpitations, leg swelling and syncope.  Gastrointestinal:  Negative for abdominal pain, constipation, diarrhea, nausea and vomiting.  Genitourinary:  Negative for dysuria and frequency.  Musculoskeletal:  Negative for back pain, neck pain and neck stiffness.  Skin:  Negative for rash and wound.  Neurological:  Negative for light-headedness and headaches.  Psychiatric/Behavioral:  Negative for agitation.   All other systems reviewed and are negative.  Physical Exam Updated Vital Signs BP (!) 144/64 (BP Location: Right Arm)    Pulse 75    Temp 98.2 F (36.8 C)    Resp 20    Ht 5\' 7"  (1.702 m)    Wt 80.7 kg    SpO2 94%    BMI 27.88 kg/m   Physical Exam Vitals and nursing note reviewed.  Constitutional:      General: He is not in acute distress.    Appearance: He is well-developed. He is not ill-appearing, toxic-appearing or diaphoretic.  HENT:     Head: Normocephalic and atraumatic.  Eyes:     Extraocular Movements: Extraocular movements intact.     Conjunctiva/sclera: Conjunctivae normal.     Pupils: Pupils are equal, round, and reactive to light.  Cardiovascular:     Rate and Rhythm: Normal rate and regular rhythm.     Heart sounds: No murmur heard. Pulmonary:     Effort: Tachypnea present. No respiratory distress.     Breath sounds: Rales present. No wheezing or rhonchi.  Chest:     Chest wall: No tenderness.  Abdominal:     Palpations: Abdomen is soft.     Tenderness: There is no abdominal tenderness.  Musculoskeletal:        General: No swelling.     Cervical back: Neck supple.     Right lower leg: No tenderness. No edema.     Left lower leg: No tenderness. No edema.  Skin:    General: Skin is warm and dry.     Capillary Refill:  Capillary refill takes less than 2 seconds.     Findings: No erythema.  Neurological:     General: No focal deficit present.     Mental Status: He is alert.  Psychiatric:        Mood and Affect: Mood normal.    ED Results / Procedures / Treatments   Labs (all labs ordered are listed, but only abnormal results are displayed) Labs Reviewed  RESP PANEL BY RT-PCR (FLU A&B, COVID) ARPGX2  CBC WITH DIFFERENTIAL/PLATELET  COMPREHENSIVE METABOLIC PANEL  BRAIN NATRIURETIC PEPTIDE  URINALYSIS, ROUTINE W REFLEX MICROSCOPIC  LACTIC ACID, PLASMA  LACTIC ACID, PLASMA  TROPONIN I (HIGH SENSITIVITY)    EKG EKG Interpretation  Date/Time:  Saturday August 19 2021 08:58:47 EST Ventricular Rate:  77 PR Interval:  192 QRS Duration: 90 QT Interval:  388 QTC Calculation: 439 R Axis:   -30 Text Interpretation: Normal sinus rhythm Left axis deviation Nonspecific ST and T wave abnormality Abnormal ECG When compared with ECG of 26-May-2019 16:08, No significant change was found When compared to prior, similar appearance. No STEMI Confirmed by Antony Blackbird [phone]) on 08/19/2021 1:44:42 PM  Radiology DG Chest 2 View  Result Date: 08/19/2021 CLINICAL DATA:  Shortness of breath.  Cough. EXAM: CHEST - 2 VIEW COMPARISON:  CT chest 07/24/2019 FINDINGS: Normal heart size. Status post median sternotomy and CABG procedure. A loop recorder is identified in the projection of the left heart border. The lungs appear hyperinflated. Pulmonary vascular congestion. No pleural effusion or interstitial edema. No airspace consolidation. The visualized osseous structures appear intact. IMPRESSION: Pulmonary vascular congestion. Electronically Signed   By: Kerby Moors M.D.   On: 08/19/2021 11:28    Procedures Procedures   Medications Ordered in ED Medications - No data to display   ED Course  I have reviewed the triage vital signs and the nursing notes.  Pertinent labs & imaging results that were available  during my care of the patient were reviewed by me and considered in my medical decision making (see chart for details).    MDM Rules/Calculators/A&P                          Jordan [name] is a 80 y.o. male with a past medical history significant for CAD status post CABG, hypertension, hyperlipidemia, GERD, carotid disease, and previous stroke who presents with cough, congestion, fatigue, chills, shortness of breath, weight gain, and chest discomfort.  According to patient, for the last week or so he has had URI symptoms with significant coughing and shortness of breath.  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  He says he did 2 home COVID test that were negative but is unsure about flu testing.  He reports that he is having chest tightness but does not describe acute chest pain.  Here describe shortness of breath that is both exertional and with coughing.  He reports no nausea, vomiting, constipation, or diarrhea.  No urinary changes.  No trauma.  No abdominal pain back pain or flank pain.  Patient is primarily having the shortness of breath, cough, but is also described weight gain about 6 pounds this week.  He denies any leg pain or leg swelling and denies history of DVT or PE.  On exam, lungs have rales  in the bases and some rhonchi.  Chest and back were nontender.  Abdomen was nontender.  Good pulses in extremities.  Legs are nontender nonedematous.  Patient was tachypneic but was not tachycardic.  His oxygen saturation was about 93% on room air while talking.  EKG does not show STEMI.  We will ambulate patient to see if he gets hypoxic as this is when his symptoms are worsened.  We will also get work-up to rule out COVID versus flu or even a cardiac cause such as myocarditis and a viral setting.  Patient had a chest x-ray in triage which did not show pneumonia but did show evidence of some pulmonary  vascular congestion.  Clinically his lungs did sound wet so we will be careful with fluids.  Anticipate reassessment after labs, troponin, BNP, and swabs for viral infections.  If concerning findings are discovered, anticipate disposition based on this.  Care transferred to oncoming team awaiting for results to be completed.    Final Clinical Impression(s) / ED Diagnoses Final diagnoses:  Exertional shortness of breath    Clinical Impression: 1. Exertional shortness of breath     Disposition: Care transferred to oncoming team while waiting for results to be completed  This note was prepared with assistance of Dragon voice recognition software. Occasional wrong-word or sound-a-like substitutions may have occurred due to the inherent limitations of voice recognition software.     Mikyla Schachter, Gwenyth Allegra, MD 08/19/21 [phone]

## 2021-08-19 NOTE — ED Notes (Signed)
2nd Troponin level obtained and to the lab

## 2021-08-20 ENCOUNTER — Observation Stay (HOSPITAL_COMMUNITY): Payer: Medicare Other

## 2021-08-20 DIAGNOSIS — Z87442 Personal history of urinary calculi: Secondary | ICD-10-CM | POA: Diagnosis not present

## 2021-08-20 DIAGNOSIS — I5033 Acute on chronic diastolic (congestive) heart failure: Secondary | ICD-10-CM | POA: Diagnosis present

## 2021-08-20 DIAGNOSIS — Z83438 Family history of other disorder of lipoprotein metabolism and other lipidemia: Secondary | ICD-10-CM | POA: Diagnosis not present

## 2021-08-20 DIAGNOSIS — Z888 Allergy status to other drugs, medicaments and biological substances status: Secondary | ICD-10-CM | POA: Diagnosis not present

## 2021-08-20 DIAGNOSIS — Z79899 Other long term (current) drug therapy: Secondary | ICD-10-CM | POA: Diagnosis not present

## 2021-08-20 DIAGNOSIS — D6861 Antiphospholipid syndrome: Secondary | ICD-10-CM | POA: Diagnosis present

## 2021-08-20 DIAGNOSIS — J069 Acute upper respiratory infection, unspecified: Secondary | ICD-10-CM | POA: Diagnosis present

## 2021-08-20 DIAGNOSIS — Z8673 Personal history of transient ischemic attack (TIA), and cerebral infarction without residual deficits: Secondary | ICD-10-CM | POA: Diagnosis not present

## 2021-08-20 DIAGNOSIS — I248 Other forms of acute ischemic heart disease: Secondary | ICD-10-CM | POA: Diagnosis present

## 2021-08-20 DIAGNOSIS — Z7982 Long term (current) use of aspirin: Secondary | ICD-10-CM | POA: Diagnosis not present

## 2021-08-20 DIAGNOSIS — E785 Hyperlipidemia, unspecified: Secondary | ICD-10-CM | POA: Diagnosis present

## 2021-08-20 DIAGNOSIS — I2510 Atherosclerotic heart disease of native coronary artery without angina pectoris: Secondary | ICD-10-CM | POA: Diagnosis present

## 2021-08-20 DIAGNOSIS — Z8249 Family history of ischemic heart disease and other diseases of the circulatory system: Secondary | ICD-10-CM | POA: Diagnosis not present

## 2021-08-20 DIAGNOSIS — Z951 Presence of aortocoronary bypass graft: Secondary | ICD-10-CM | POA: Diagnosis not present

## 2021-08-20 DIAGNOSIS — Z96651 Presence of right artificial knee joint: Secondary | ICD-10-CM | POA: Diagnosis present

## 2021-08-20 DIAGNOSIS — I639 Cerebral infarction, unspecified: Secondary | ICD-10-CM | POA: Diagnosis not present

## 2021-08-20 DIAGNOSIS — Z974 Presence of external hearing-aid: Secondary | ICD-10-CM | POA: Diagnosis not present

## 2021-08-20 DIAGNOSIS — I110 Hypertensive heart disease with heart failure: Secondary | ICD-10-CM | POA: Diagnosis present

## 2021-08-20 DIAGNOSIS — Z20822 Contact with and (suspected) exposure to covid-19: Secondary | ICD-10-CM | POA: Diagnosis present

## 2021-08-20 DIAGNOSIS — I6529 Occlusion and stenosis of unspecified carotid artery: Secondary | ICD-10-CM | POA: Diagnosis present

## 2021-08-20 DIAGNOSIS — R0902 Hypoxemia: Secondary | ICD-10-CM | POA: Diagnosis not present

## 2021-08-20 HISTORY — PX: TRANSTHORACIC ECHOCARDIOGRAM: SHX275

## 2021-08-20 LAB — BASIC METABOLIC PANEL
Anion gap: 10 (ref 5–15)
BUN: 18 mg/dL (ref 8–23)
CO2: 27 mmol/L (ref 22–32)
Calcium: 8.2 mg/dL — ABNORMAL LOW (ref 8.9–10.3)
Chloride: 101 mmol/L (ref 98–111)
Creatinine, Ser: 1.03 mg/dL (ref 0.61–1.24)
GFR, Estimated: >60 mL/min (ref >60)
Glucose, Bld: 101 mg/dL — ABNORMAL HIGH (ref 70–99)
Potassium: 4 mmol/L (ref 3.5–5.1)
Sodium: 138 mmol/L (ref 135–145)

## 2021-08-20 LAB — RESPIRATORY PANEL BY PCR

## 2021-08-20 LAB — CBC
HCT: 40.4 % (ref 39.0–52.0)
Hemoglobin: 13.6 g/dL (ref 13.0–17.0)
MCH: 31.6 pg (ref 26.0–34.0)
MCHC: 33.7 g/dL (ref 30.0–36.0)
MCV: 94 fL (ref 80.0–100.0)
Platelets: 205 10*3/uL (ref 150–400)
RBC: 4.3 MIL/uL (ref 4.22–5.81)
RDW: 13.5 % (ref 11.5–15.5)
WBC: 7.3 10*3/uL (ref 4.0–10.5)
nRBC: 0 % (ref 0.0–0.2)

## 2021-08-20 LAB — ECHOCARDIOGRAM COMPLETE
AR max vel: 2.36 cm2
AV Area VTI: 2.49 cm2
AV Area mean vel: 2.27 cm2
AV Mean grad: 4 mmHg
AV Peak grad: 7.4 mmHg
Ao pk vel: 1.36 m/s
Area-P 1/2: 3.93 cm2
Height: 67 in
S' Lateral: 3 cm
Weight: 2853.63 oz

## 2021-08-20 LAB — D-DIMER, QUANTITATIVE: D-Dimer, Quant: 1.28 ug/mL-FEU — ABNORMAL HIGH (ref 0.00–0.50)

## 2021-08-20 LAB — TROPONIN I (HIGH SENSITIVITY): Troponin I (High Sensitivity): 31 ng/L — ABNORMAL HIGH (ref <18)

## 2021-08-20 LAB — MAGNESIUM: Magnesium: 2.2 mg/dL (ref 1.7–2.4)

## 2021-08-20 IMAGING — CT CT ANGIO CHEST
3 of 7 series · 18 of 36 positions shown · IV contrast (omnipaque)
Comparison: None.

CLINICAL DATA: Chest pain or SOB, pleurisy or effusion suspected

EXAM:
CT ANGIOGRAPHY CHEST WITH CONTRAST
TECHNIQUE: Multidetector CT imaging of the chest was performed using the
standard protocol during bolus administration of intravenous
contrast. Multiplanar CT image reconstructions and MIPs were
obtained to evaluate the vascular anatomy.
CONTRAST:  100mL OMNIPAQUE IOHEXOL 350 MG/ML SOLN

[Series 6: pe lung · axial · 0.71mm/px · z∈[-646,-574]mm · 2 of 146 slices shown]
[im 37/146  mediastinal]
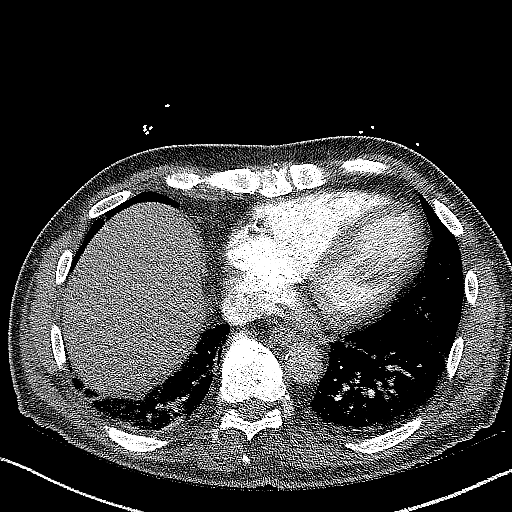
[im 73/146  mediastinal]
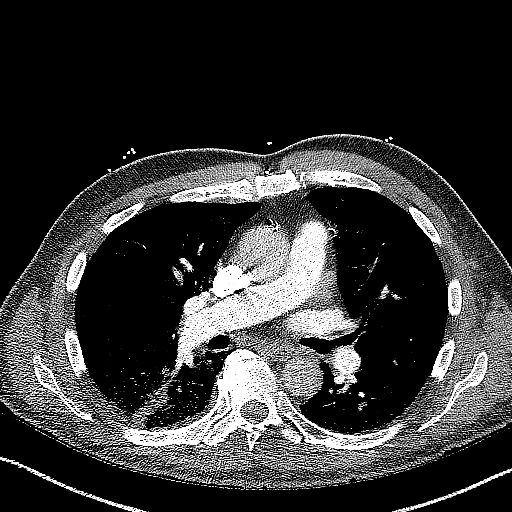

[Series 7: pe thins · axial · 0.71mm/px · z∈[-765,-452]mm · 15 of 514 slices shown]
[im 33/514  lung]
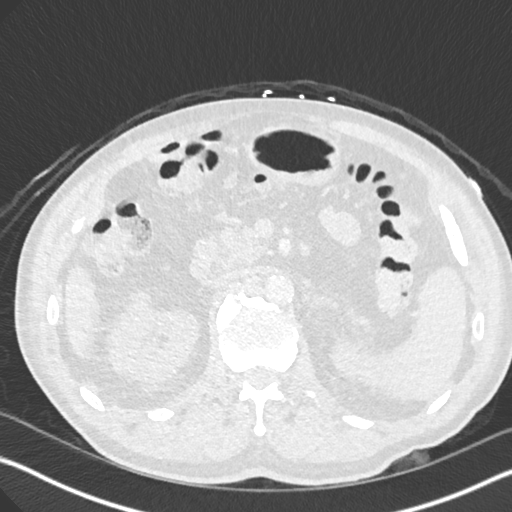
[im 65/514  mediastinal]
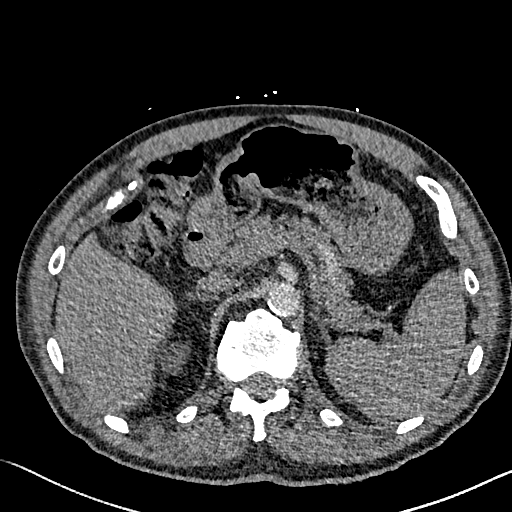
[im 97/514  lung]
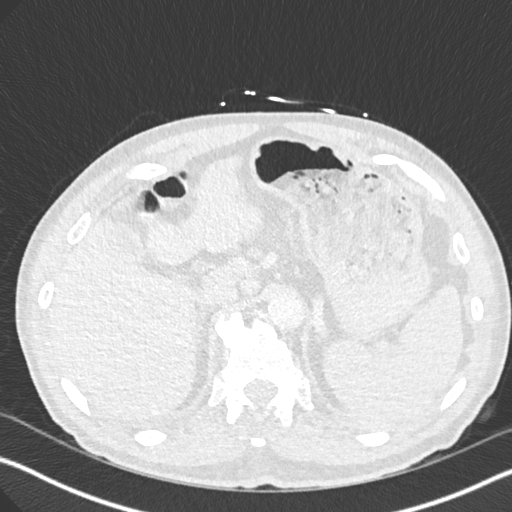
[im 129/514  mediastinal]
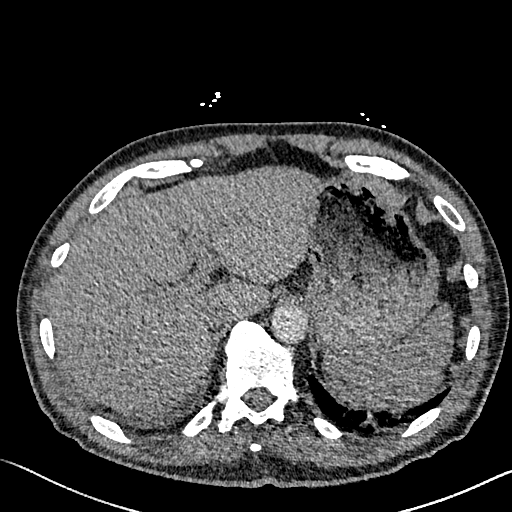
[im 161/514  lung]
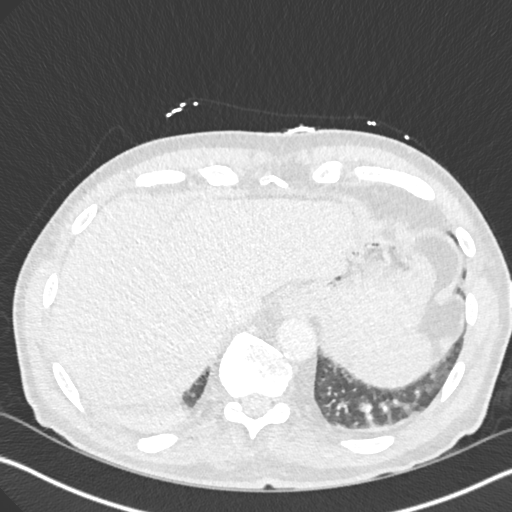
[im 193/514  mediastinal]
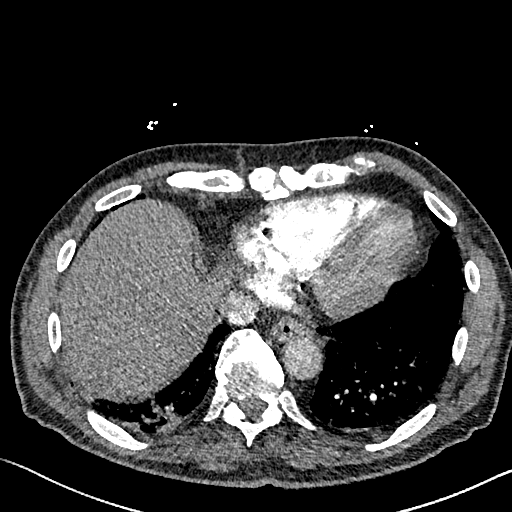
[im 225/514  lung]
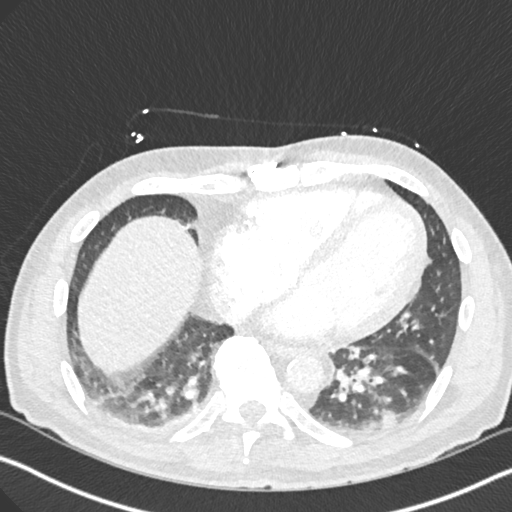
[im 257/514  mediastinal]
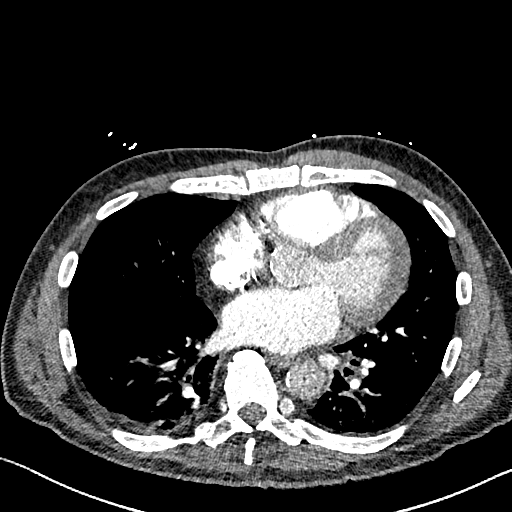
[im 289/514  lung]
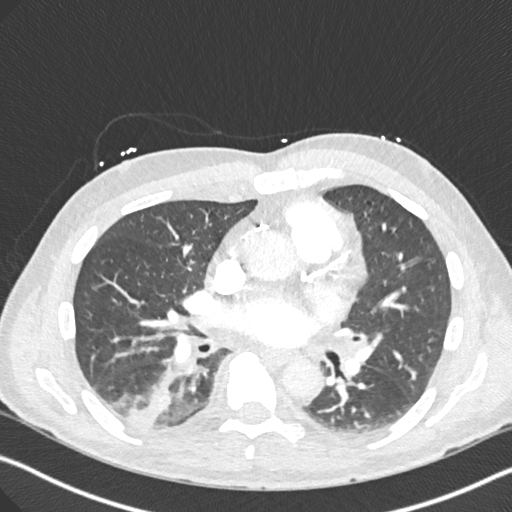
[im 321/514  mediastinal]
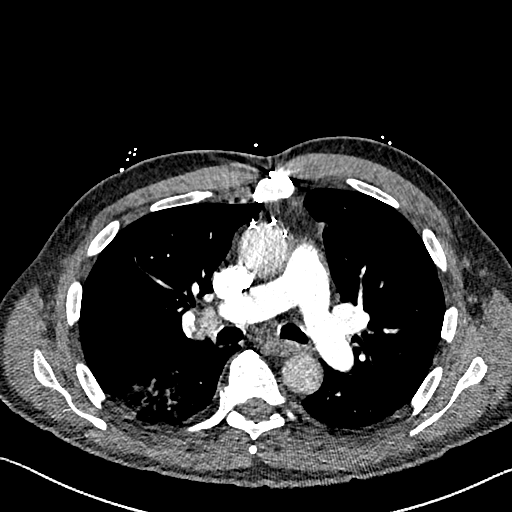
[im 353/514  lung]
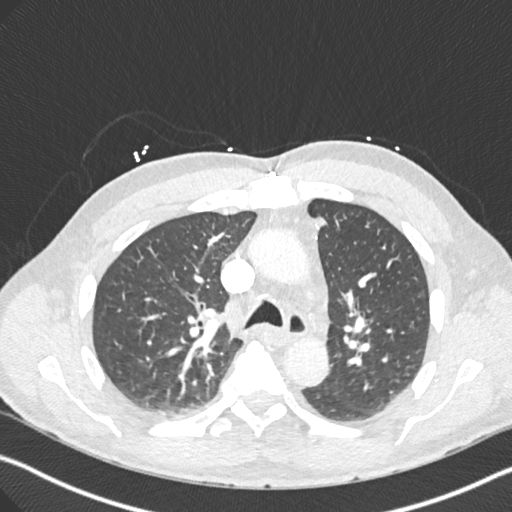
[im 385/514  mediastinal]
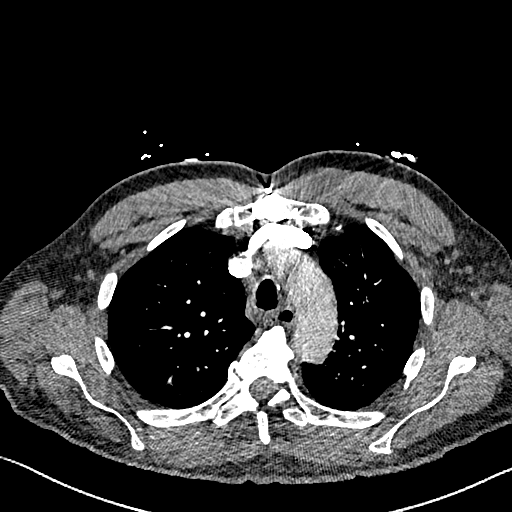
[im 417/514  lung]
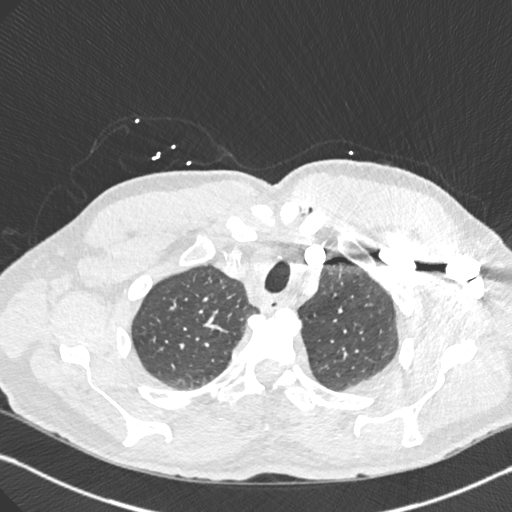
[im 449/514  mediastinal]
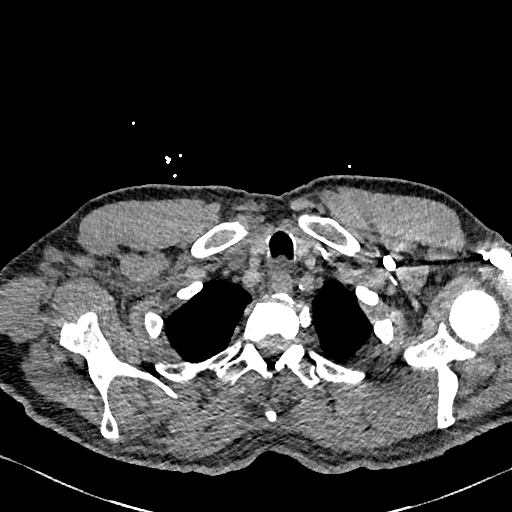
[im 481/514  lung]
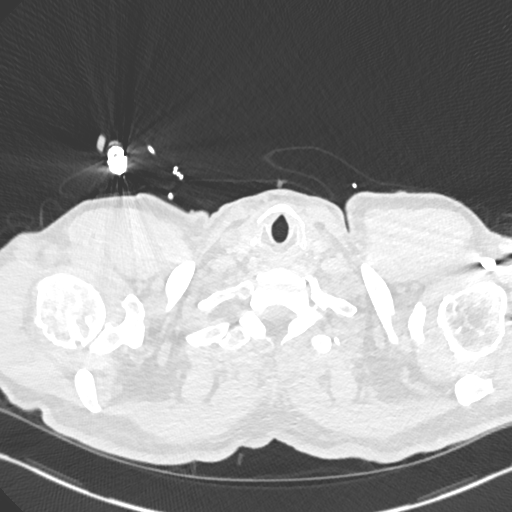

[Series 8: pe 2mm cor · coronal · 0.70mm/px · 1 of 108 slices shown]
[im 54/108  mediastinal]
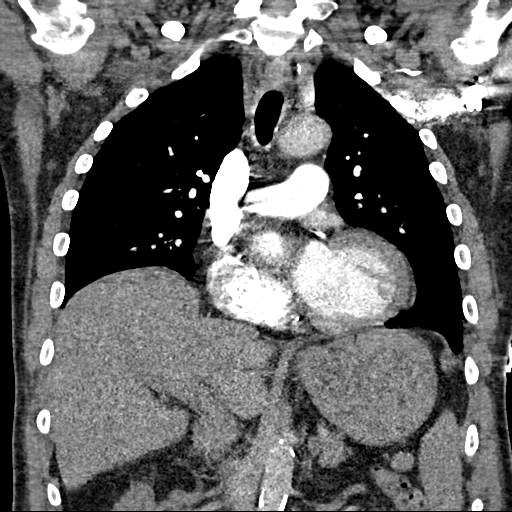

[18 of 36 positions shown; findings below may reference images not displayed]

FINDINGS: Cardiovascular: Satisfactory opacification of the pulmonary arteries
to the segmental level. No evidence of pulmonary embolism. Slightly
limited evaluation of the subsegmental level due to timing of
contrast and consolidation. Normal heart size. No significant
pericardial effusion. The thoracic aorta is normal in caliber. At
least mild atherosclerotic plaque of the thoracic aorta. Four-vessel
coronary artery calcifications status post coronary artery bypass
graft.

Mediastinum/Nodes: Enlarged 1.1 cm right hilar lymph node. No
enlarged mediastinal, hilar, or axillary lymph nodes. Thyroid gland,
trachea, and esophagus demonstrate no significant findings.

Lungs/Pleura: Bilateral lower lobe, right greater than left, patchy
airspace opacities. No pulmonary nodule. No pulmonary mass. No
pleural effusion. No pneumothorax. Mild diffuse bronchial wall
thickening.

Upper Abdomen: Splenule is noted.  No acute abnormality.

Musculoskeletal:

No abdominal wall hernia or abnormality.

No suspicious lytic or blastic osseous lesions. No acute displaced
fracture. Multilevel degenerative changes of the spine.

Review of the MIP images confirms the above findings.
IMPRESSION: 1. No central or segmental pulmonary embolus.
2. Bilateral lower lobe, right greater than left, patchy airspace
opacities likely representing combination of atelectasis and/or
infection/inflammation.
3. Possibly reactive 1.1 cm right hilar lymph node. Attention on
follow-up.
4.  Aortic Atherosclerosis ([C2]-[C2]).

## 2021-08-20 MED ORDER — ASPIRIN EC 81 MG PO TBEC
81.0000 mg | DELAYED_RELEASE_TABLET | Freq: Every day | ORAL | Status: DC
  Administered 2021-08-20 – 2021-08-21 (×2): 81 mg via ORAL
  Filled 2021-08-20 (×2): qty 1

## 2021-08-20 MED ORDER — ONDANSETRON HCL 4 MG/2ML IJ SOLN: 4.0000 mg | Freq: Four times a day (QID) | INTRAMUSCULAR | Status: DC | PRN

## 2021-08-20 MED ORDER — ENOXAPARIN SODIUM 40 MG/0.4ML IJ SOSY
40.0000 mg | PREFILLED_SYRINGE | INTRAMUSCULAR | Status: DC
  Administered 2021-08-20 – 2021-08-21 (×2): 40 mg via SUBCUTANEOUS
  Filled 2021-08-20 (×2): qty 0.4

## 2021-08-20 MED ORDER — FUROSEMIDE 10 MG/ML IJ SOLN
20.0000 mg | Freq: Two times a day (BID) | INTRAMUSCULAR | Status: DC
  Administered 2021-08-20 – 2021-08-21 (×3): 20 mg via INTRAVENOUS
  Filled 2021-08-20 (×3): qty 2

## 2021-08-20 MED ORDER — IOHEXOL 350 MG/ML SOLN
100.0000 mL | Freq: Once | INTRAVENOUS | Status: AC | PRN
  Administered 2021-08-20: 100 mL via INTRAVENOUS

## 2021-08-20 MED ORDER — IRBESARTAN 75 MG PO TABS
75.0000 mg | ORAL_TABLET | Freq: Every day | ORAL | Status: DC
  Administered 2021-08-20 – 2021-08-21 (×2): 75 mg via ORAL
  Filled 2021-08-20 (×2): qty 1

## 2021-08-20 MED ORDER — ACETAMINOPHEN 650 MG RE SUPP: 650.0000 mg | Freq: Four times a day (QID) | RECTAL | Status: DC | PRN

## 2021-08-20 MED ORDER — ACETAMINOPHEN 325 MG PO TABS: 650.0000 mg | ORAL_TABLET | Freq: Four times a day (QID) | ORAL | Status: DC | PRN

## 2021-08-20 MED ORDER — PRAVASTATIN SODIUM 10 MG PO TABS
20.0000 mg | ORAL_TABLET | Freq: Every evening | ORAL | Status: DC
  Administered 2021-08-20 – 2021-08-21 (×2): 20 mg via ORAL
  Filled 2021-08-20 (×2): qty 2

## 2021-08-20 MED ORDER — ONDANSETRON HCL 4 MG PO TABS: 4.0000 mg | ORAL_TABLET | Freq: Four times a day (QID) | ORAL | Status: DC | PRN

## 2021-08-20 MED ORDER — SODIUM CHLORIDE 0.9% FLUSH
3.0000 mL | Freq: Two times a day (BID) | INTRAVENOUS | Status: DC
  Administered 2021-08-20 – 2021-08-21 (×3): 3 mL via INTRAVENOUS

## 2021-08-20 MED ORDER — GUAIFENESIN-DM 100-10 MG/5ML PO SYRP
5.0000 mL | ORAL_SOLUTION | ORAL | Status: DC | PRN
  Administered 2021-08-20 – 2021-08-21 (×2): 5 mL via ORAL
  Filled 2021-08-20 (×2): qty 5

## 2021-08-20 NOTE — Assessment & Plan Note (Addendum)
Pt with severe coughing fits that lead to significant dyspnea and near-panic.  ?if volume overload vs URI / bronchitis vs ?PE.  CXR in the ED showed pulmonary vascular congestion, no focal consolidations. --diuresing as outlined --antitussives PRN -- No PE seen on CTA chest --negative for covid and flu --Respiratory viral panel negative

## 2021-08-20 NOTE — H&P (Signed)
History and Physical    Daylen Lipsky CBJ:[id number] DOB: 1941-02-22 DOA: 08/19/2021  PCP: Crist Infante, MD   Patient coming from: Home   Chief Complaint: SOB, cough   HPI: Jordan [name] is a pleasant 81 y.o. male with medical history significant for CAD with CABG in 2019, hypertension, cryptogenic CVA, and antiphospholipid syndrome, now presenting to the emergency department with cough and shortness of breath.  Patient reports roughly a week of cough and dyspnea.  Cough has been productive of thick sputum.  He denies fevers, chills, leg swelling or tenderness, chest pain, or hemoptysis.  He develops coughing fits with acute worsening in shortness of breath, begins to panic during these episodes, but then recovers when he takes deep breaths in through his nose.  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  He is physically very active doing 10 sets of 30 push-ups twice a week in addition to cycling, rowing, work on his farm, and other exercises.  ED Course: Upon arrival to the ED, patient is found to be afebrile, saturating mid 90s on room air, and with stable blood pressure. He was initially evaluated [facility] ED where he had vascular congestion on chest x-ray, mild elevation in troponins, and BNP of 646. Cardiology, Dr. Debara Pickett, was consulted by the ED physician at [facility] and recommended medical admission with echocardiogram.  Patient was given IV Lasix in the ED, diuresed, and elected to go home rather than being admitted but had ongoing symptoms and then came into [facility] ED a short time later.  Review of Systems:  All other systems reviewed and apart from HPI, are negative.  Past Medical History:  Diagnosis Date   Abnormal nuclear stress test 03/05/2017   Bibasilar crackles 02/26/2017   Carotid stenosis, asymptomatic 07/27/2011   DDD  (degenerative disc disease)    Dyspnea    Dyspnea on exertion 01/28/2017   ED (erectile dysfunction)    Essential hypertension 01/29/2017   GERD (gastroesophageal reflux disease)    History of hiatal hernia    History of kidney stones    passed stones - no surgery required   History of wheezing 02/26/2017   HTN (hypertension)    Hyperlipidemia    Hyperlipidemia with target low density lipoprotein (LDL) cholesterol less than 70 mg/dL 03/31/2014   Impaired fasting glucose    Knee pain    bilateral   Left main coronary artery disease 03/18/2017   Cardiac Cath: 55% dLM, tandem p-mLAD 75% --> referred for CABG.   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 Cyndia Bent @ [facility]):  LIMA-LAD, SVG-D2, SVG-OM, SVG-PDA (endoscopic SVG harvesting from right leg).   Shoulder pain    Wears hearing aid     Past Surgical History:  Procedure Laterality Date   APPENDECTOMY     2020   BUBBLE STUDY  08/05/2019   Procedure: BUBBLE STUDY;  Surgeon: Buford Dresser, MD;  Location: [place];  Service: Cardiovascular;;   Cardiac Stress Test  2008   Normal   CARPAL TUNNEL RELEASE Left 1998   CARPAL TUNNEL RELEASE Right 2008   [place], [place]  in [place]  06/2013   Henrene Pastor - polyps   CORONARY ARTERY BYPASS GRAFT N/A 04/04/2017   Procedure: CORONARY ARTERY BYPASS GRAFTING x 4  LIMA-LAD, SVG-D2, SVG-OM, SVG-PDA (endoscopic SVG harvesting from right leg);  Surgeon: Gaye Pollack, MD;  Location: [place];  Service: Open Heart Surgery;  Laterality: N/A;   EXTRACORPOREAL SHOCK WAVE LITHOTRIPSY Right 10/23/2018   Procedure: EXTRACORPOREAL SHOCK WAVE LITHOTRIPSY (ESWL);  Surgeon: Bjorn Loser, MD;  Location: [facility];  Service: Urology;  Laterality: Right;   HERNIA REPAIR Left    inguinal    HIATAL HERNIA REPAIR     no surgery per pt   KNEE CARTILAGE SURGERY Bilateral    LEFT HEART CATH AND CORONARY ANGIOGRAPHY N/A 03/18/2017   Procedure: Left Heart Cath and Coronary Angiography;  Surgeon: Leonie Man, MD;  Location: [place] [facility];  Service: Cardiovascular:  55% dLM, p-mLAD 75% - mLAD 75%.  EF 55-65%. Mod non-obstructive RCA disease --> referred for CABG   LOOP RECORDER INSERTION N/A 08/05/2019   Procedure: LOOP RECORDER INSERTION;  Surgeon: Constance Haw, MD;  Location: [place] [facility];  Service: Cardiovascular;  Laterality: N/A;   MEDIAL PARTIAL KNEE REPLACEMENT Right 2010   Partial right knee replacement   NM MYOVIEW LTD  01/2017   INTERMEDIATE RISK: Exercise tolerance was good at 10 minutes, however, fatigue and dyspnea were reporte-d -> hypertensive response to exercise.  42mm Horizontal ST depressions noted during stress in the II, III, aVF, V6, V5 and V4 leads c/w ischemia.   Small siize, moderate severity perfusion defect  mid to distal anterior wall perfusion defect suggestive of ischemia.Marland Kitchen    ROTATOR CUFF REPAIR Left 2008   SHOULDER ARTHROSCOPY W/ ROTATOR CUFF REPAIR Right 06/15/2016   TEE WITHOUT CARDIOVERSION N/A 04/04/2017   Procedure: TRANSESOPHAGEAL ECHOCARDIOGRAM (TEE);  Surgeon: Gaye Pollack, MD;  Location: [place];  Service: Open Heart Surgery;  Laterality: N/A;   TEE WITHOUT CARDIOVERSION N/A 08/05/2019   Procedure: TRANSESOPHAGEAL ECHOCARDIOGRAM (TEE);  Surgeon: Buford Dresser, MD;  Location: [facility];  Service: Cardiovascular;  Laterality: N/A;   TONSILLECTOMY      Social History:   reports that he has never smoked. He has never used smokeless tobacco. He reports current alcohol use. He reports that he does not use drugs.  Allergies  Allergen Reactions   Nexium [Esomeprazole]     Unknown reaction   Crestor [Rosuvastatin Calcium] Other (See Comments)    Muscle aches     Family History  Problem Relation Age of Onset    Heart failure Mother    Cancer Mother        breast   Coronary artery disease Father        CABG   Heart attack Father        Long-term smoker.   Hyperlipidemia Father    Hyperlipidemia Brother    Hypertension Brother    Hyperlipidemia Son    Healthy Daughter    Colon cancer Neg Hx    Colon polyps Neg Hx    Rectal cancer Neg Hx    Stomach cancer Neg Hx      Prior to Admission medications   Medication Sig Start Date End Date Taking? Authorizing Provider  acetaminophen (TYLENOL) 500 MG tablet Take 500 mg by mouth 2 (two) times daily.    [provider]  aspirin EC 81 MG tablet Take 81 mg by mouth daily.    [provider]  Cholecalciferol (VITAMIN  D3) 250 MCG (10000 UT) capsule Take 10,000 Units by mouth 2 (two) times daily.    [provider]  COVID-19 mRNA Vac-TriS, Pfizer, (PFIZER-BIONT COVID-19 VAC-TRIS) SUSP injection Inject into the muscle. 12/15/20   Carlyle Basques, MD  diclofenac Sodium (VOLTAREN) 1 % GEL Apply 1 application topically 4 (four) times daily as needed (shoulder pain).    [provider]  EPINEPHrine 0.3 mg/0.3 mL IJ SOAJ injection Inject 0.3 mg into the muscle as needed for anaphylaxis.  05/06/19   [provider]  ezetimibe (ZETIA) 10 MG tablet Take 10 mg by mouth daily.     [provider]  famotidine (PEPCID) 10 MG tablet Take 10 mg by mouth 2 (two) times daily. Patient not taking: Reported on 01/25/2021    [provider]  furosemide (LASIX) 20 MG tablet Take 1 tablet (20 mg total) by mouth daily for 4 days. 08/19/21 08/23/21  Deno Etienne, DO  Menthol (ICY HOT BACK) 5 % PTCH Apply 1 patch topically daily as needed (pain).    [provider]  Menthol, Topical Analgesic, (ICY HOT BACK EXTRA STRENGTH EX) Apply 1 application topically daily as needed (pain).    [provider]  Multiple Vitamin (MULTIVITAMIN) tablet Take 1 tablet by mouth daily.    [provider]  OVER THE  COUNTER MEDICATION Take 1 Dose by mouth 2 (two) times daily as needed. CBD oil    [provider]  pravastatin (PRAVACHOL) 20 MG tablet Take 20 mg by mouth every evening.  10/18/18   [provider]  tadalafil (ADCIRCA/CIALIS) 20 MG tablet Take 20 mg by mouth daily as needed for erectile dysfunction.    [provider]  testosterone (ANDROGEL) 50 MG/5GM (1%) GEL Place 5 g onto the skin daily.    [provider]  valsartan (DIOVAN) 80 MG tablet Take 1 tablet by mouth daily. 03/21/20   [provider]    Physical Exam: Vitals:   08/19/21 2152 08/19/21 2327 08/19/21 2330  BP: (!) 148/80 (!) 116/59   Pulse: 84 80   Resp: 20 (!) 22   Temp:   98.9 F (37.2 C)  TempSrc:   Oral  SpO2: 92% 93%     Constitutional: NAD, calm  Eyes: PERTLA, lids and conjunctivae normal ENMT: Mucous membranes are moist. Posterior pharynx clear of any exudate or lesions.   Neck: supple, no masses  Respiratory: no wheezing, no crackles. No accessory muscle use.  Cardiovascular: S1 & S2 heard, regular rate and rhythm.  No leg swelling.  Abdomen: No distension, no tenderness, soft. Bowel sounds active.  Musculoskeletal: no clubbing / cyanosis. No joint deformity upper and lower extremities.   Skin: no significant rashes, lesions, ulcers. Warm, dry, well-perfused. Neurologic: CN 2-12 grossly intact. Moving all extremities. Alert and oriented.  Psychiatric: Very pleasant. Cooperative.    Labs and Imaging on Admission: I have personally reviewed following labs and imaging studies  CBC: Recent Labs  Lab 08/19/21 1425 08/19/21 2203  WBC 6.4 6.8  NEUTROABS 3.7 4.3  HGB 13.5 14.3  HCT 40.4 43.2  MCV 93.5 94.5  PLT 187 263   Basic Metabolic Panel: Recent Labs  Lab 08/19/21 1425 08/19/21 2203  NA 137 136  K 3.9 3.9  CL 101 101  CO2 26 27  GLUCOSE 76 150*  BUN 13 17  CREATININE 0.74 1.04  CALCIUM 8.4* 8.4*   GFR: Estimated Creatinine Clearance: 59.5 mL/min  (by C-G formula based on SCr of 1.04 mg/dL).  Liver Function Tests: Recent Labs  Lab 08/19/21 1425  AST 21  ALT 14  ALKPHOS 69  BILITOT 0.8  PROT 6.4*  ALBUMIN 3.6   No results for input(s): LIPASE, AMYLASE in the last 168 hours. No results for input(s): AMMONIA in the last 168 hours. Coagulation Profile: No results for input(s): INR, PROTIME in the last 168 hours. Cardiac Enzymes: No results for input(s): CKTOTAL, CKMB, CKMBINDEX, TROPONINI in the last 168 hours. BNP (last 3 results) No results for input(s): PROBNP in the last 8760 hours. HbA1C: No results for input(s): HGBA1C in the last 72 hours. CBG: No results for input(s): GLUCAP in the last 168 hours. Lipid Profile: No results for input(s): CHOL, HDL, LDLCALC, TRIG, CHOLHDL, LDLDIRECT in the last 72 hours. Thyroid Function Tests: No results for input(s): TSH, T4TOTAL, FREET4, T3FREE, THYROIDAB in the last 72 hours. Anemia Panel: No results for input(s): VITAMINB12, FOLATE, FERRITIN, TIBC, IRON, RETICCTPCT in the last 72 hours. Urine analysis:    Component Value Date/Time   COLORURINE COLORLESS (A) 08/19/2021 1425   APPEARANCEUR CLEAR 08/19/2021 1425   LABSPEC 1.007 08/19/2021 1425   PHURINE 5.5 08/19/2021 1425   GLUCOSEU NEGATIVE 08/19/2021 1425   HGBUR NEGATIVE 08/19/2021 1425   BILIRUBINUR NEGATIVE 08/19/2021 1425   KETONESUR 40 (A) 08/19/2021 1425   PROTEINUR NEGATIVE 08/19/2021 1425   NITRITE NEGATIVE 08/19/2021 1425   LEUKOCYTESUR NEGATIVE 08/19/2021 1425   Sepsis Labs: @LABRCNTIP (procalcitonin:4,lacticidven:4) ) Recent Results (from the past 240 hour(s))  Resp Panel by RT-PCR (Flu A&B, Covid) Nasopharyngeal Swab     Status: None   Collection Time: 08/19/21  2:25 PM   Specimen: Nasopharyngeal Swab; Nasopharyngeal(NP) swabs in vial transport medium  Result Value Ref Range Status   SARS Coronavirus 2 by RT PCR NEGATIVE NEGATIVE Final    Comment: (NOTE) SARS-CoV-2 target nucleic acids are NOT  DETECTED.  The SARS-CoV-2 RNA is generally detectable in upper respiratory specimens during the acute phase of infection. The lowest concentration of SARS-CoV-2 viral copies this assay can detect is 138 copies/mL. A negative result does not preclude SARS-Cov-2 infection and should not be used as the sole basis for treatment or other patient management decisions. A negative result may occur with  improper specimen collection/handling, submission of specimen other than nasopharyngeal swab, presence of viral mutation(s) within the areas targeted by this assay, and inadequate number of viral copies(<138 copies/mL). A negative result must be combined with clinical observations, patient history, and epidemiological information. The expected result is Negative.  Fact Sheet for Patients:  EntrepreneurPulse.com.au  Fact Sheet for Healthcare Providers:  IncredibleEmployment.be  This test is no t yet approved or cleared by the [place] FDA and  has been authorized for detection and/or diagnosis of SARS-CoV-2 by FDA under an Emergency Use Authorization (EUA). This EUA will remain  in effect (meaning this test can be used) for the duration of the COVID-19 declaration under Section 564(b)(1) of the Act, 21 U.S.C.section 360bbb-3(b)(1), unless the authorization is terminated  or revoked sooner.       Influenza A by PCR NEGATIVE NEGATIVE Final   Influenza B by PCR NEGATIVE NEGATIVE Final    Comment: (NOTE) The Xpert Xpress SARS-CoV-2/FLU/RSV plus assay is intended as an aid in the diagnosis of influenza from Nasopharyngeal swab specimens and should not be used as a sole basis for treatment. Nasal washings and aspirates are unacceptable for Xpert Xpress SARS-CoV-2/FLU/RSV testing.  Fact Sheet for Patients: EntrepreneurPulse.com.au  Fact Sheet for Healthcare Providers: IncredibleEmployment.be  This test  is not yet  approved or cleared by the [place] and has been authorized for detection and/or diagnosis of SARS-CoV-2 by FDA under an Emergency Use Authorization (EUA). This EUA will remain in effect (meaning this test can be used) for the duration of the COVID-19 declaration under Section 564(b)(1) of the Act, 21 U.S.C. section 360bbb-3(b)(1), unless the authorization is terminated or revoked.  Performed at [facility], [address], [place], [place] [address]      Radiological Exams on Admission: DG Chest 2 View  Result Date: 08/19/2021 CLINICAL DATA:  Shortness of breath.  Cough. EXAM: CHEST - 2 VIEW COMPARISON:  CT chest 07/24/2019 FINDINGS: Normal heart size. Status post median sternotomy and CABG procedure. A loop recorder is identified in the projection of the left heart border. The lungs appear hyperinflated. Pulmonary vascular congestion. No pleural effusion or interstitial edema. No airspace consolidation. The visualized osseous structures appear intact. IMPRESSION: Pulmonary vascular congestion. Electronically Signed   By: Kerby Moors M.D.   On: 08/19/2021 11:28    EKG: Independently reviewed. Sinus rhythm, LAD.   Assessment/Plan   1. Acute on chronic diastolic CHF  - Presents with cough and SOB, found to have vascular congestion on CXR, BNP 646, and slightly elevated troponin  - ED discussed with cardiology who recommended medical admission and echo, he was given IV Lasix in ED, but then elected to go home before coming back d/t ongoing sxs  - EF was 60-65% in Oct 2020  - There is no peripheral edema or orthopnea, alternative diagnoses considered including PE in light of antiphospholipid syndrome  - Trend troponin, check d-dimer, continue diuresis, update echo, monitor weight and I/Os    2. Hypertension  - Continue ARB    3. Hx of CVA  - Continue ASA and statin     DVT prophylaxis: Lovenox Code Status: Full  Level of Care: Level of care:  Telemetry Cardiac Family Communication: wife at bedside  Disposition Plan:  Patient is from: Home  Anticipated d/c is to: Home  Anticipated d/c date is: 1/1 or 08/21/21 Patient currently: Pending echocardiogram, d-dimer  Consults called: none  Admission status: Observation     Vianne Bulls, MD [facility]  08/20/2021, 12:53 AM

## 2021-08-20 NOTE — Assessment & Plan Note (Signed)
--  Continue ARB 

## 2021-08-20 NOTE — Assessment & Plan Note (Addendum)
Not on anticoagulation, has hx of stroke. 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 D-dimer elevated.  CTA chest negative for PE

## 2021-08-20 NOTE — Progress Notes (Signed)
°  Echocardiogram 2D Echocardiogram has been performed.  Jordan [name] F 08/20/2021, 1:38 PM

## 2021-08-20 NOTE — Assessment & Plan Note (Addendum)
Stable.  No acute issues. Continue ASA and statin.

## 2021-08-20 NOTE — Assessment & Plan Note (Addendum)
Presented with cough and SOB, found to have vascular congestion on CXR, BNP 646, and slightly elevated troponin consistent with demand ischemia. ED provider discussed with cardiology who recommended medical admission and Echo. Pt was given IV Lasix in ED, but then elected to go home before coming back d/t ongoing coughing spells. Prior Echo with EF 60-65% in Oct 2020.  ?Cough due to vascular congestion but pt reports friends with similar cough recently, possible bronchitis.   --Started on IV diuresis on admission --Cardiology consulted --Diuresis per cardiology --D-dimer elevated and hx of antiphospholipid syndrome not on anticoagulation, brings PE into the differential.  CTA chest negative for PE, showed bilateral lower lobe airspace opacities representing combination of atelectasis and/or infection per scintillation --Continue daily weights at home --Monitor renal function and electrolytes and follow-up

## 2021-08-20 NOTE — Hospital Course (Signed)
Jordan [name] is a pleasant 81 y.o. male with medical history significant for CAD with CABG in 2019, hypertension, cryptogenic CVA, and antiphospholipid syndrome, presented to the ED on 08/19/2021 with cough spells and shortness of breath x ~1 week.  He reported coughing fits lead worsening dyspnea and near panic with inability to catch his breath.    Initially patient was seen at [facility] ED where he had vascular congestion on chest x-ray, mild elevation in troponins, and BNP of 646. Cardiology, Dr. Debara Pickett, was consulted by the ED physician at [facility] and recommended medical admission with echocardiogram.  Patient was given IV Lasix in the ED, diuresed, and elected to go home rather than being admitted but had ongoing symptoms and then came into [facility] ED a short time later.   Admitted to [facility] service with cardiology consulted. Started on IV diuresis and symptoms appear to be improving.

## 2021-08-20 NOTE — Assessment & Plan Note (Signed)
CAD stable.  On ASA and statin.

## 2021-08-20 NOTE — Progress Notes (Signed)
Progress Note   Patient: Jordan [name] OEV:[id number] DOB: 10-Nov-1940 DOA: 08/19/2021     0 DOS: the patient was seen and examined on 08/20/2021   Brief hospital course: Jordan [name] is a pleasant 81 y.o. male with medical history significant for CAD with CABG in 2019, hypertension, cryptogenic CVA, and antiphospholipid syndrome, presented to the ED on 08/19/2021 with cough spells and shortness of breath x ~1 week.  He reported coughing fits lead worsening dyspnea and near panic with inability to catch his breath.    Initially patient was seen at [facility] ED where he had vascular congestion on chest x-ray, mild elevation in troponins, and BNP of 646. Cardiology, Dr. Debara Pickett, was consulted by the ED physician at [facility] and recommended medical admission with echocardiogram.  Patient was given IV Lasix in the ED, diuresed, and elected to go home rather than being admitted but had ongoing symptoms and then came into [facility] ED a short time later.   Admitted to [facility] service with cardiology consulted. Started on IV diuresis and symptoms appear to be improving.  Assessment and Plan * Acute on chronic diastolic (congestive) heart failure ([place])- (present on admission) Presented with cough and SOB, found to have vascular congestion on CXR, BNP 646, and slightly elevated troponin consistent with demand ischemia. ED provider discussed with cardiology who recommended medical admission and Echo. Pt was given IV Lasix in ED, but then elected to go home before coming back d/t ongoing coughing spells. Prior Echo with EF 60-65% in Oct 2020.  ?Cough due to vascular congestion but pt reports friends with similar cough recently, possible bronchitis.   --Started on IV diuresis on admission --Cardiology consulted --D-dimer elevated and hx of antiphospholipid syndrome not on anticoagulation, brings PE into the differential --CTA chest pending --Continue on diuresis per cardiology --I/O's and  daily weights --Monitor renal function and electrolytes  URI, acute- (present on admission) Pt with severe coughing fits that lead to significant dyspnea and near-panic.  ?if volume overload vs URI / bronchitis vs ?PE.  CXR in the ED showed pulmonary vascular congestion, no focal consolidations. --diuresing as outlined --antitussives PRN --PE evaluation underway --negative for covid and flu, checking full respiratory viral panel --defer antibiotics and monitor --check procal w with AM labs  Essential hypertension- (present on admission) Continue ARB  Cryptogenic stroke ([place])- (present on admission) Stable.  No acute issues. Continue ASA and statin.  Antiphospholipid antibody syndrome ([place])- (present on admission) Not on anticoagulation, has hx of stroke. 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 D-dimer elevated, ?if PE causing his cough. CTA chest pending  S/P CABG x 4 CAD stable.  On ASA and statin.     Subjective: Pt seen with wife at bedside.  He denies SOB but has not had coughing fits as severe as he was last night.  No fever/chills.  Reports multiple friends with similar cough recently.    Objective Vitals reviewed and notable for intermittent tachypnea, no hypoxia, BP's stable.   Data Reviewed: Labs and data reviewed, notable for -  Troponin trend flat. D-dimer elevated 1.28 Negative respiratory panel Echo EF 82-99%, norma diastolic parameters, moderate LA dilation  Family Communication: wife at bedside on rounds today 1/1  Disposition: Status is: Inpatient  Remains inpatient appropriate because: on IV diuresis, further evaluation ongoing as outlined above.         Time spent: 30 minutes  Author: Floyce Stakes  Arbutus Ped 08/20/2021 3:43 PM  For on call review [email or web address].

## 2021-08-20 NOTE — Consult Note (Signed)
CARDIOLOGY CONSULT NOTE       Patient ID: Jordan [name] MRN: [id number] DOB/AGE: [birth year] 81 y.o.  Admit date: 08/19/2021 Referring Physician: Arbutus Ped Primary Physician: Crist Infante, MD Primary Cardiologist: Camnitz/Harding Reason for Consultation: CHF  Principal Problem:   Acute on chronic diastolic (congestive) heart failure [facility]) Active Problems:   Essential hypertension   Cryptogenic stroke [facility])   HPI:  81 y.o. with history of cryptogenic stroke/ILR, CAD with CABG in 2018 with LIMA to LAD, SVG D2, SVG OM and SVG to PDA.  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  He has been sick with URI for a couple of weeks. Has a couple of friends with similar dyspnea and coughing COVID/Influenza negative on admission. Denies fever but has persistent non productive cough. No chest pain On admission CXR showed vascular congestion BNP 646 some improvement with lasix. Not toxic WBC not elevated lactic acid normal Troponin no real trend 22-> 21.    ROS All other systems reviewed and negative except as noted above  Past Medical History:  Diagnosis Date   Abnormal nuclear stress test 03/05/2017   Bibasilar crackles 02/26/2017   Carotid stenosis, asymptomatic 07/27/2011   DDD (degenerative disc disease)    Dyspnea    Dyspnea on exertion 01/28/2017   ED (erectile dysfunction)    Essential hypertension 01/29/2017   GERD (gastroesophageal reflux disease)    History of hiatal hernia    History of kidney stones    passed stones - no surgery required   History of wheezing 02/26/2017   HTN (hypertension)    Hyperlipidemia    Hyperlipidemia with target low density lipoprotein (LDL) cholesterol less than 70 mg/dL 03/31/2014   Impaired fasting glucose    Knee pain    bilateral   Left main coronary artery disease 03/18/2017   Cardiac Cath: 55% dLM, tandem p-mLAD 75% -->  referred for CABG.   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 Cyndia Bent @ [facility]):  LIMA-LAD, SVG-D2, SVG-OM, SVG-PDA (endoscopic SVG harvesting from right leg).   Shoulder pain    Wears hearing aid     Family History  Problem Relation Age of Onset   Heart failure Mother    Cancer Mother        breast   Coronary artery disease Father        CABG   Heart attack Father        Long-term smoker.   Hyperlipidemia Father    Hyperlipidemia Brother    Hypertension Brother    Hyperlipidemia Son    Healthy Daughter    Colon cancer Neg Hx    Colon polyps Neg Hx    Rectal cancer Neg Hx    Stomach cancer Neg Hx     Social History   Socioeconomic History   Marital status: Married    Spouse name: Not on file   Number of children: 2   Years of education: Not on file   Highest education level: Master's degree (e.g., MA, MS, MEng, MEd, MSW, MBA)  Occupational History   Occupation: retired    Comment: Press photographer  Tobacco Use   Smoking status: Never  Smokeless tobacco: Never  Vaping Use   Vaping Use: Never used  Substance and Sexual Activity   Alcohol use: Yes    Comment: occasionally   Drug use: No   Sexual activity: Yes  Other Topics Concern   Not on file  Social History Narrative   [name] is a very pleasant married gentleman with 2 children [name] and [place]) each with 3 children/total 6 grandchildren. He is currently retired now from his original job in [employer], however he still serves on [employer] at [employer] and recently stepped down from [employer] at [employer].   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    For the last several months he has been off caffeine and wine because of GI upset symptoms and palpitations. Also trying to lose  weight.     He used to drink maybe 2 glasses of wine at night, and he is subsequently all quit alcohol.   He is relatively active with exercise but not with routine pattern for standard routine. He does push AND other calisthenics as well as kayaking and bike riding.    Social Determinants of Health   Financial Resource Strain: Not on file  Food Insecurity: Not on file  Transportation Needs: Not on file  Physical Activity: Not on file  Stress: Not on file  Social Connections: Not on file  Intimate Partner Violence: Not on file    Past Surgical History:  Procedure Laterality Date   APPENDECTOMY     2020   BUBBLE STUDY  08/05/2019   Procedure: BUBBLE STUDY;  Surgeon: Buford Dresser, MD;  Location: [place];  Service: Cardiovascular;;   Cardiac Stress Test  2008   Normal   CARPAL TUNNEL RELEASE Left [place] Right 2008   CHOLECYSTECTOMY, LAPAROSCOPIC  2021   in [place]  06/2013   Henrene Pastor - polyps   CORONARY ARTERY BYPASS GRAFT N/A 04/04/2017   Procedure: CORONARY ARTERY BYPASS GRAFTING x 4  LIMA-LAD, SVG-D2, SVG-OM, SVG-PDA (endoscopic SVG harvesting from right leg);  Surgeon: Gaye Pollack, MD;  Location: [place];  Service: Open Heart Surgery;  Laterality: N/A;   EXTRACORPOREAL SHOCK WAVE LITHOTRIPSY Right 10/23/2018   Procedure: EXTRACORPOREAL SHOCK WAVE LITHOTRIPSY (ESWL);  Surgeon: Bjorn Loser, MD;  Location: [facility];  Service: Urology;  Laterality: Right;   HERNIA REPAIR Left    inguinal   HIATAL HERNIA REPAIR     no surgery per pt   KNEE CARTILAGE SURGERY Bilateral    LEFT HEART CATH AND CORONARY ANGIOGRAPHY N/A 03/18/2017   Procedure: Left Heart Cath and Coronary Angiography;  Surgeon: Leonie Man, MD;  Location: [place] [facility];  Service: Cardiovascular:  55% dLM, p-mLAD 75% - mLAD 75%.  EF 55-65%. Mod non-obstructive RCA disease --> referred for CABG   LOOP RECORDER INSERTION N/A 08/05/2019   Procedure: LOOP  RECORDER INSERTION;  Surgeon: Constance Haw, MD;  Location: [place] [facility];  Service: Cardiovascular;  Laterality: N/A;   MEDIAL PARTIAL KNEE REPLACEMENT Right 2010   Partial right knee replacement   NM [facility]  01/2017   INTERMEDIATE RISK: Exercise tolerance was good at 10 minutes, however, fatigue and dyspnea were reporte-d -> hypertensive response to exercise.  95mm Horizontal ST depressions noted during stress in the II, III, aVF, V6, V5 and V4 leads c/w ischemia.   Small siize, moderate severity perfusion defect  mid  to distal anterior wall perfusion defect suggestive of ischemia.Marland Kitchen    ROTATOR CUFF REPAIR Left 2008   SHOULDER ARTHROSCOPY W/ ROTATOR CUFF REPAIR Right 06/15/2016   TEE WITHOUT CARDIOVERSION N/A 04/04/2017   Procedure: TRANSESOPHAGEAL ECHOCARDIOGRAM (TEE);  Surgeon: Gaye Pollack, MD;  Location: [place];  Service: Open Heart Surgery;  Laterality: N/A;   TEE WITHOUT CARDIOVERSION N/A 08/05/2019   Procedure: TRANSESOPHAGEAL ECHOCARDIOGRAM (TEE);  Surgeon: Buford Dresser, MD;  [facility];  Service: Cardiovascular;  Laterality: N/A;   TONSILLECTOMY        Current Facility-Administered Medications:    acetaminophen (TYLENOL) tablet 650 mg, 650 mg, Oral, Q6H PRN **OR** acetaminophen (TYLENOL) suppository 650 mg, 650 mg, Rectal, Q6H PRN, Opyd, Ilene Qua, MD   aspirin EC tablet 81 mg, 81 mg, Oral, Daily, Opyd, Ilene Qua, MD, 81 mg at 08/20/21 0852   enoxaparin (LOVENOX) injection 40 mg, 40 mg, Subcutaneous, Q24H, Opyd, Ilene Qua, MD, 40 mg at 08/20/21 0853   furosemide (LASIX) injection 20 mg, 20 mg, Intravenous, Q12H, Opyd, Ilene Qua, MD, 20 mg at 08/20/21 0321   guaiFENesin-dextromethorphan (ROBITUSSIN DM) 100-10 MG/5ML syrup 5 mL, 5 mL, Oral, Q4H PRN, Opyd, Ilene Qua, MD   irbesartan (AVAPRO) tablet 75 mg, 75 mg, Oral, Daily, Opyd, Ilene Qua, MD, 75 mg at 08/20/21 0852   ondansetron (ZOFRAN) tablet 4 mg, 4 mg, Oral, Q6H PRN **OR** ondansetron  (ZOFRAN) injection 4 mg, 4 mg, Intravenous, Q6H PRN, Opyd, Ilene Qua, MD   pravastatin (PRAVACHOL) tablet 20 mg, 20 mg, Oral, QPM, Opyd, Timothy S, MD   sodium chloride flush (NS) 0.9 % injection 3 mL, 3 mL, Intravenous, Q12H, Opyd, Ilene Qua, MD, 3 mL at 08/20/21 1104  aspirin EC  81 mg Oral Daily   enoxaparin (LOVENOX) injection  40 mg Subcutaneous Q24H   furosemide  20 mg Intravenous Q12H   irbesartan  75 mg Oral Daily   pravastatin  20 mg Oral QPM   sodium chloride flush  3 mL Intravenous Q12H     Physical Exam: Blood pressure 120/61, pulse 66, temperature 97.9 F (36.6 C), temperature source Oral, resp. rate 19, height 5\' 7"  (1.702 m), weight 80.9 kg, SpO2 93 %.    Elderly male Coughing  Mild rhonchi at base No murmur  Abdomen benign Trace edema Palpable pedal pulses   Labs:   Lab Results  Component Value Date   WBC 7.3 08/20/2021   HGB 13.6 08/20/2021   HCT 40.4 08/20/2021   MCV 94.0 08/20/2021   PLT 205 08/20/2021    Recent Labs  Lab 08/19/21 1425 08/19/21 2203 08/20/21 0224  NA 137   < > 138  K 3.9   < > 4.0  CL 101   < > 101  CO2 26   < > 27  BUN 13   < > 18  CREATININE 0.74   < > 1.03  CALCIUM 8.4*   < > 8.2*  PROT 6.4*  --   --   BILITOT 0.8  --   --   ALKPHOS 69  --   --   ALT 14  --   --   AST 21  --   --   GLUCOSE 76   < > 101*   < > = values in this interval not displayed.   No results found for: CKTOTAL, CKMB, CKMBINDEX, TROPONINI No results found for: CHOL No results found for: HDL No results found for: LDLCALC No results found for: TRIG No results  found for: CHOLHDL No results found for: LDLDIRECT    Radiology: DG Chest 2 View  Result Date: 08/19/2021 CLINICAL DATA:  Shortness of breath.  Cough. EXAM: CHEST - 2 VIEW COMPARISON:  CT chest 07/24/2019 FINDINGS: Normal heart size. Status post median sternotomy and CABG procedure. A loop recorder is identified in the projection of the left heart border. The lungs appear hyperinflated.  Pulmonary vascular congestion. No pleural effusion or interstitial edema. No airspace consolidation. The visualized osseous structures appear intact. IMPRESSION: Pulmonary vascular congestion. Electronically Signed   By: Kerby Moors M.D.   On: 08/19/2021 11:28   CUP PACEART REMOTE DEVICE CHECK  Result Date: 08/10/2021 ILR summary report received. Battery status OK. Normal device function. No new symptom, tachy, brady, or pause episodes. No new AF episodes. Monthly summary reports and ROV/PRN LH   EKG: SR LAD no acute changes    ASSESSMENT AND PLAN:   CHF:  not clear if diastolic EF has been normal Agree with low dose daily lasix TTE pending CAD/CABG:  No chest pain prior to CABG symptoms were exertional dyspnea. No acute ECG changes troponin not significant would only consider right and left cath with grafts if TTE shows change in EF URI:  COVID/Influenza negative ? Check extended viral panel to exclude RSV and other. Robitussin for cough Not on antibiotics per primary service Anti-phospholipid:  given elevated d dimer and hypercoagulable state not on anticoagulation will order chest CTA to r/o PE    Signed: Jenkins Rouge 08/20/2021, 12:20 PM

## 2021-08-21 DIAGNOSIS — I639 Cerebral infarction, unspecified: Secondary | ICD-10-CM

## 2021-08-21 DIAGNOSIS — Z951 Presence of aortocoronary bypass graft: Secondary | ICD-10-CM | POA: Diagnosis not present

## 2021-08-21 DIAGNOSIS — I5033 Acute on chronic diastolic (congestive) heart failure: Secondary | ICD-10-CM | POA: Diagnosis not present

## 2021-08-21 LAB — BASIC METABOLIC PANEL
Anion gap: 7 (ref 5–15)
BUN: 17 mg/dL (ref 8–23)
CO2: 30 mmol/L (ref 22–32)
Calcium: 8.5 mg/dL — ABNORMAL LOW (ref 8.9–10.3)
Chloride: 101 mmol/L (ref 98–111)
Creatinine, Ser: 1.03 mg/dL (ref 0.61–1.24)
GFR, Estimated: >60 mL/min (ref >60)
Glucose, Bld: 106 mg/dL — ABNORMAL HIGH (ref 70–99)
Potassium: 3.8 mmol/L (ref 3.5–5.1)
Sodium: 138 mmol/L (ref 135–145)

## 2021-08-21 MED ORDER — GUAIFENESIN-CODEINE 100-10 MG/5ML PO SOLN: 10.0000 mL | Freq: Two times a day (BID) | ORAL | 0 refills | Status: DC | PRN

## 2021-08-21 MED ORDER — FUROSEMIDE 40 MG PO TABS: 40.0000 mg | ORAL_TABLET | Freq: Every day | ORAL | Status: DC

## 2021-08-21 MED ORDER — GUAIFENESIN-DM 100-10 MG/5ML PO SYRP: 5.0000 mL | ORAL_SOLUTION | ORAL | 0 refills | Status: DC | PRN

## 2021-08-21 MED ORDER — SODIUM CHLORIDE 0.9 % IV SOLN
100.0000 mg | Freq: Two times a day (BID) | INTRAVENOUS | Status: DC
  Administered 2021-08-21: 100 mg via INTRAVENOUS
  Filled 2021-08-21 (×2): qty 100

## 2021-08-21 MED ORDER — FUROSEMIDE 40 MG PO TABS: 40.0000 mg | ORAL_TABLET | Freq: Every day | ORAL | 1 refills | Status: DC

## 2021-08-21 MED ORDER — EZETIMIBE 10 MG PO TABS
10.0000 mg | ORAL_TABLET | Freq: Every day | ORAL | Status: DC
  Administered 2021-08-21: 10 mg via ORAL
  Filled 2021-08-21: qty 1

## 2021-08-21 MED ORDER — ALBUTEROL SULFATE (2.5 MG/3ML) 0.083% IN NEBU: 3.0000 mL | INHALATION_SOLUTION | Freq: Four times a day (QID) | RESPIRATORY_TRACT | Status: DC | PRN

## 2021-08-21 MED ORDER — PANTOPRAZOLE SODIUM 40 MG PO TBEC: 40.0000 mg | DELAYED_RELEASE_TABLET | Freq: Every day | ORAL | Status: DC

## 2021-08-21 MED ORDER — AMOXICILLIN-POT CLAVULANATE 875-125 MG PO TABS: 1.0000 | ORAL_TABLET | Freq: Two times a day (BID) | ORAL | 0 refills | Status: AC

## 2021-08-21 MED ORDER — SODIUM CHLORIDE 0.9 % IV SOLN
1.0000 g | INTRAVENOUS | Status: DC
  Administered 2021-08-21: 1 g via INTRAVENOUS
  Filled 2021-08-21: qty 10

## 2021-08-21 NOTE — Progress Notes (Signed)
SATURATION QUALIFICATIONS: (This note is used to comply with regulatory documentation for home oxygen)  Patient Saturations on Room Air at Rest = 92%  Patient Saturations on Room Air while Ambulating = 95%

## 2021-08-21 NOTE — Progress Notes (Signed)
Mobility Specialist Progress Note    08/21/21 1016  Mobility  Activity Ambulated in hall  Level of Assistance Standby assist, set-up cues, supervision of patient - no hands on  Assistive Device None  Distance Ambulated (ft) 480 ft  Mobility Ambulated independently in hallway  Mobility Response Tolerated well  Mobility performed by Mobility specialist  $Mobility charge 1 Mobility   Pre-Mobility: 71 HR, 93% SpO2 During Mobility: 91% SpO2 Post-Mobility: 78 HR, 92% SpO2  Pt received in bed and agreeable. No complaints on walk. Ambulated on RA. Able to maintain conversation throughout. Left in bed with call bell in reach and wife present.   [facility] Mobility Specialist  M.S. Primary Phone: [phone] M.S. Secondary Phone: [phone]

## 2021-08-21 NOTE — Progress Notes (Signed)
Mobility Specialist Progress Note    08/21/21 1639  Mobility  Activity Ambulated in hall  Level of Assistance Independent  Assistive Device None  Distance Ambulated (ft) 1450 ft  Mobility Ambulated independently in hallway  Mobility Response Tolerated well  Mobility performed by Mobility specialist  $Mobility charge 1 Mobility   Pt received sitting EOB and agreeable. SpO2 maintained between 92-96% on RA. Pt had quicker pace and stated he felt good. Left sitting EOB with call bell in reach.   [facility] Mobility Specialist  M.S. Primary Phone: [phone] M.S. Secondary Phone: [phone]

## 2021-08-21 NOTE — Discharge Summary (Signed)
Physician Discharge Summary   Patient: Jordan [name] MRN: [id number] DOB: @DOB   Admit date:     08/19/2021  Discharge date: 09/14/21  Discharge Physician: Ezekiel Slocumb   PCP: Crist Infante, MD   Recommendations at discharge: 1. Follow up with Cardiology 2. Follow up with PCP in 1-2 weeks 3. Repeat CBC/BMP in 1-2 weeks 4. Follow up regarding improvement in cough / pneumonia symptoms    Discharge Diagnoses Principal Problem:   Acute on chronic diastolic (congestive) heart failure (HCC) Active Problems:   URI, acute   Essential hypertension   Cryptogenic stroke (HCC)   Antiphospholipid antibody syndrome (HCC)   S/P CABG x 4    Discharge Day: pt ambulated on room air, maintaining O2 sats on room air with exertion.  Clinically improved , medically stable and requests discharge home.    Lasix dose was increased per cardiology. 4 more days PO antibiotics and PCP follow up.   Hospital Course   Jordan [name] is a pleasant 81 y.o. male with medical history significant for CAD with CABG in 2019, hypertension, cryptogenic CVA, and antiphospholipid syndrome, presented to the ED on 08/19/2021 with cough spells and shortness of breath x ~1 week.  He reported coughing fits lead worsening dyspnea and near panic with inability to catch his breath.    Initially patient was seen at [facility] ED where he had vascular congestion on chest x-ray, mild elevation in troponins, and BNP of 646. Cardiology, Dr. Debara Pickett, was consulted by the ED physician at [facility] and recommended medical admission with echocardiogram.  Patient was given IV Lasix in the ED, diuresed, and elected to go home rather than being admitted but had ongoing symptoms and then came into [facility] ED a short time later.   Admitted to [facility] service with cardiology consulted. Started on IV diuresis and symptoms appear to be improving.  * Acute on chronic diastolic (congestive) heart failure ([place])- (present on  admission) Presented with cough and SOB, found to have vascular congestion on CXR, BNP 646, and slightly elevated troponin consistent with demand ischemia. ED provider discussed with cardiology who recommended medical admission and Echo. Pt was given IV Lasix in ED, but then elected to go home before coming back d/t ongoing coughing spells. Prior Echo with EF 60-65% in Oct 2020.  ?Cough due to vascular congestion but pt reports friends with similar cough recently, possible bronchitis.   --Started on IV diuresis on admission --Cardiology consulted --Diuresis per cardiology --D-dimer elevated and hx of antiphospholipid syndrome not on anticoagulation, brings PE into the differential.  CTA chest negative for PE, showed bilateral lower lobe airspace opacities representing combination of atelectasis and/or infection per scintillation --Continue daily weights at home --Monitor renal function and electrolytes and follow-up  URI, acute- (present on admission) Pt with severe coughing fits that lead to significant dyspnea and near-panic.  ?if volume overload vs URI / bronchitis vs ?PE.  CXR in the ED showed pulmonary vascular congestion, no focal consolidations. --diuresing as outlined --antitussives PRN -- No PE seen on CTA chest --negative for covid and flu --Respiratory viral panel negative  Essential hypertension- (present on admission) Continue ARB  Cryptogenic stroke ([place])- (present on admission) Stable.  No acute issues. Continue ASA and statin.  Antiphospholipid antibody syndrome ([place])- (present on admission) Not on anticoagulation, has hx of stroke. 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 D-dimer elevated.  CTA chest negative for PE  S/P CABG x 4 CAD stable.  On ASA and statin.        Consultants: Cardiology Procedures performed: None Disposition: Home Diet recommendation: Cardiac diet  DISCHARGE  MEDICATION: Allergies as of 08/21/2021       Reactions   Nexium [esomeprazole]    Unknown reaction   Crestor [rosuvastatin Calcium] Other (See Comments)   Muscle aches        Medication List     STOP taking these medications    diclofenac Sodium 1 % Gel Commonly known as: VOLTAREN   famotidine 10 MG tablet Commonly known as: PEPCID   furosemide 20 MG tablet Commonly known as: LASIX   Pfizer-BioNT COVID-19 Vac-TriS Susp injection Generic drug: COVID-19 mRNA Vac-TriS [employer])       TAKE these medications    acetaminophen 500 MG tablet Commonly known as: TYLENOL Take 500 mg by mouth as needed.   aspirin EC 81 MG tablet Take 81 mg by mouth daily.   EPINEPHrine 0.3 mg/0.3 mL Soaj injection Commonly known as: EPI-PEN Inject 0.3 mg into the muscle as needed for anaphylaxis.   ezetimibe 10 MG tablet Commonly known as: ZETIA Take 10 mg by mouth daily.   Icy Hot Back 5 % Ptch Generic drug: Menthol Apply 1 patch topically daily as needed (pain).   multivitamin tablet Take 1 tablet by mouth daily.   OVER THE COUNTER MEDICATION Take 1 Dose by mouth 2 (two) times daily as needed. CBD oil   pantoprazole 40 MG tablet Commonly known as: PROTONIX Take 40 mg by mouth daily.   pravastatin 20 MG tablet Commonly known as: PRAVACHOL Take 20 mg by mouth every evening.   tadalafil 20 MG tablet Commonly known as: CIALIS Take 20 mg by mouth daily as needed for erectile dysfunction.   testosterone 50 MG/5GM (1%) Gel Commonly known as: ANDROGEL Place 5 g onto the skin daily.   TYLENOL PM EXTRA STRENGTH PO Take 1 tablet by mouth daily as needed (sleep).   valsartan 80 MG tablet Commonly known as: DIOVAN Take 1 tablet by mouth daily.   vitamin C 1000 MG tablet 1 tablet   Zinc 30 MG Tabs 1 tablet       ASK your doctor about these medications    amoxicillin-clavulanate 875-125 MG tablet Commonly known as: Augmentin Take 1 tablet by mouth every 12 (twelve)  hours for 4 days. Ask about: Should I take this medication?         Discharge Exam: Filed Weights   08/20/21 0300 08/21/21 0541  Weight: 80.9 kg 77.7 kg   General exam: awake, alert, no acute distress HEENT: atraumatic, clear conjunctiva, anicteric sclera, moist mucus membranes, hearing grossly normal  Respiratory system: CTAB with diminished bases, no wheezes, rales or rhonchi, normal respiratory effort. Cardiovascular system: normal S1/S2, RRR, no JVD, murmurs, rubs, gallops,  no pedal edema.   Gastrointestinal system: soft, NT, ND, no HSM felt, +bowel sounds. Central nervous system: A&O x3. no gross focal neurologic deficits, normal speech Extremities: moves all , no edema, normal tone Skin: dry, intact, normal temperature, normal color, No rashes, lesions or ulcers seen on visualized skin Psychiatry: normal mood, congruent affect, judgement and insight appear normal   Condition at discharge: stable  The results of significant diagnostics from this hospitalization (including imaging, microbiology, ancillary and laboratory) are listed below for reference.   Imaging Studies: DG Chest 2 View  Result Date: 08/19/2021 CLINICAL DATA:  Shortness of breath.  Cough. EXAM: CHEST - 2 VIEW  COMPARISON:  CT chest 07/24/2019 FINDINGS: Normal heart size. Status post median sternotomy and CABG procedure. A loop recorder is identified in the projection of the left heart border. The lungs appear hyperinflated. Pulmonary vascular congestion. No pleural effusion or interstitial edema. No airspace consolidation. The visualized osseous structures appear intact. IMPRESSION: Pulmonary vascular congestion. Electronically Signed   By: Kerby Moors M.D.   On: 08/19/2021 11:28   CT Angio Chest Pulmonary Embolism (PE) W or WO Contrast  Result Date: 08/20/2021 CLINICAL DATA:  Chest pain or SOB, pleurisy or effusion suspected EXAM: CT ANGIOGRAPHY CHEST WITH CONTRAST TECHNIQUE: Multidetector CT imaging of the  chest was performed using the standard protocol during bolus administration of intravenous contrast. Multiplanar CT image reconstructions and MIPs were obtained to evaluate the vascular anatomy. CONTRAST:  126mL OMNIPAQUE IOHEXOL 350 MG/ML SOLN COMPARISON:  None. FINDINGS: Cardiovascular: Satisfactory opacification of the pulmonary arteries to the segmental level. No evidence of pulmonary embolism. Slightly limited evaluation of the subsegmental level due to timing of contrast and consolidation. Normal heart size. No significant pericardial effusion. The thoracic aorta is normal in caliber. At least mild atherosclerotic plaque of the thoracic aorta. Four-vessel coronary artery calcifications status post coronary artery bypass graft. Mediastinum/Nodes: Enlarged 1.1 cm right hilar lymph node. No enlarged mediastinal, hilar, or axillary lymph nodes. Thyroid gland, trachea, and esophagus demonstrate no significant findings. Lungs/Pleura: Bilateral lower lobe, right greater than left, patchy airspace opacities. No pulmonary nodule. No pulmonary mass. No pleural effusion. No pneumothorax. Mild diffuse bronchial wall thickening. Upper Abdomen: Splenule is noted.  No acute abnormality. Musculoskeletal: No abdominal wall hernia or abnormality. No suspicious lytic or blastic osseous lesions. No acute displaced fracture. Multilevel degenerative changes of the spine. Review of the MIP images confirms the above findings. IMPRESSION: 1. No central or segmental pulmonary embolus. 2. Bilateral lower lobe, right greater than left, patchy airspace opacities likely representing combination of atelectasis and/or infection/inflammation. 3. Possibly reactive 1.1 cm right hilar lymph node. Attention on follow-up. 4.  Aortic Atherosclerosis (ICD10-I70.0). Electronically Signed   By: Iven Finn M.D.   On: 08/20/2021 16:23   ECHOCARDIOGRAM COMPLETE  Result Date: 08/20/2021    ECHOCARDIOGRAM REPORT   Patient Name:   Jordan DAVID  [name] Date of Exam: 08/20/2021 Medical Rec #:  [id number]            Height:       67.0 in Accession #:    [id number]           Weight:       178.4 lb Date of Birth:  1941/04/30             BSA:          1.926 m Patient Age:    49 years             BP:           132/66 mmHg Patient Gender: M                    HR:           76 bpm. Exam Location:  Inpatient Procedure: 2D Echo, Cardiac Doppler and Color Doppler Indications:    CHF  History:        Patient has prior history of Echocardiogram examinations, most                 recent 05/21/2019. Stroke. Constant coughing.  Sonographer:    [place] Referring Phys: [id number]  TIMOTHY S OPYD IMPRESSIONS  1. Left ventricular ejection fraction, by estimation, is 60 to 65%. The left ventricle has normal function. The left ventricle has no regional wall motion abnormalities. Left ventricular diastolic parameters were normal.  2. Right ventricular systolic function is normal. The right ventricular size is normal.  3. Left atrial size was moderately dilated.  4. The mitral valve is abnormal. Trivial mitral valve regurgitation. No evidence of mitral stenosis.  5. The aortic valve is tricuspid. There is mild calcification of the aortic valve. Aortic valve regurgitation is not visualized. Aortic valve sclerosis is present, with no evidence of aortic valve stenosis.  6. The inferior vena cava is normal in size with greater than 50% respiratory variability, suggesting right atrial pressure of 3 mmHg. FINDINGS  Left Ventricle: Left ventricular ejection fraction, by estimation, is 60 to 65%. The left ventricle has normal function. The left ventricle has no regional wall motion abnormalities. The left ventricular internal cavity size was normal in size. There is  no left ventricular hypertrophy. Left ventricular diastolic parameters were normal. Right Ventricle: The right ventricular size is normal. No increase in right ventricular wall thickness. Right ventricular systolic function  is normal. Left Atrium: Left atrial size was moderately dilated. Right Atrium: Right atrial size was normal in size. Pericardium: There is no evidence of pericardial effusion. Mitral Valve: The mitral valve is abnormal. There is mild thickening of the mitral valve leaflet(s). Trivial mitral valve regurgitation. No evidence of mitral valve stenosis. Tricuspid Valve: The tricuspid valve is normal in structure. Tricuspid valve regurgitation is mild . No evidence of tricuspid stenosis. Aortic Valve: The aortic valve is tricuspid. There is mild calcification of the aortic valve. Aortic valve regurgitation is not visualized. Aortic valve sclerosis is present, with no evidence of aortic valve stenosis. Aortic valve mean gradient measures 4.0 mmHg. Aortic valve peak gradient measures 7.4 mmHg. Aortic valve area, by VTI measures 2.49 cm. Pulmonic Valve: The pulmonic valve was normal in structure. Pulmonic valve regurgitation is not visualized. No evidence of pulmonic stenosis. Aorta: The aortic root is normal in size and structure. Venous: The inferior vena cava is normal in size with greater than 50% respiratory variability, suggesting right atrial pressure of 3 mmHg. IAS/Shunts: No atrial level shunt detected by color flow Doppler.  LEFT VENTRICLE PLAX 2D LVIDd:         4.50 cm   Diastology LVIDs:         3.00 cm   LV e' medial:    9.37 cm/s LV PW:         1.10 cm   LV E/e' medial:  9.5 LV IVS:        0.80 cm   LV e' lateral:   18.80 cm/s LVOT diam:     2.00 cm   LV E/e' lateral: 4.8 LV SV:         72 LV SV Index:   38 LVOT Area:     3.14 cm  RIGHT VENTRICLE             IVC RV Basal diam:  4.00 cm     IVC diam: 2.20 cm RV Mid diam:    2.90 cm RV S prime:     12.90 cm/s TAPSE (M-mode): 1.3 cm LEFT ATRIUM             Index        RIGHT ATRIUM           Index LA diam:  4.50 cm 2.34 cm/m   RA Area:     20.00 cm LA Vol (A2C):   94.7 ml 49.17 ml/m  RA Volume:   51.80 ml  26.89 ml/m LA Vol (A4C):   98.3 ml 51.03 ml/m  LA Biplane Vol: 97.0 ml 50.36 ml/m  AORTIC VALVE AV Area (Vmax):    2.36 cm AV Area (Vmean):   2.27 cm AV Area (VTI):     2.49 cm AV Vmax:           136.00 cm/s AV Vmean:          95.000 cm/s AV VTI:            0.290 m AV Peak Grad:      7.4 mmHg AV Mean Grad:      4.0 mmHg LVOT Vmax:         102.00 cm/s LVOT Vmean:        68.700 cm/s LVOT VTI:          0.230 m LVOT/AV VTI ratio: 0.79  AORTA Ao Root diam: 2.90 cm Ao Asc diam:  3.30 cm MITRAL VALVE MV Area (PHT): 3.93 cm    SHUNTS MV Decel Time: 193 msec    Systemic VTI:  0.23 m MV E velocity: 89.40 cm/s  Systemic Diam: 2.00 cm MV A velocity: 54.40 cm/s MV E/A ratio:  1.64 Jenkins Rouge MD Electronically signed by Jenkins Rouge MD Signature Date/Time: 08/20/2021/2:19:52 PM    Final     Microbiology: Results for orders placed or performed during the hospital encounter of 08/19/21  Respiratory (~20 pathogens) panel by PCR     Status: None   Collection Time: 08/20/21  1:48 PM   Specimen: Nasopharyngeal Swab; Respiratory  Result Value Ref Range Status   Adenovirus NOT DETECTED NOT DETECTED Final   Coronavirus 229E NOT DETECTED NOT DETECTED Final    Comment: (NOTE) The Coronavirus on the Respiratory Panel, DOES NOT test for the novel  Coronavirus (2019 nCoV)    Coronavirus HKU1 NOT DETECTED NOT DETECTED Final   Coronavirus NL63 NOT DETECTED NOT DETECTED Final   Coronavirus OC43 NOT DETECTED NOT DETECTED Final   Metapneumovirus NOT DETECTED NOT DETECTED Final   Rhinovirus / Enterovirus NOT DETECTED NOT DETECTED Final   Influenza A NOT DETECTED NOT DETECTED Final   Influenza B NOT DETECTED NOT DETECTED Final   Parainfluenza Virus 1 NOT DETECTED NOT DETECTED Final   Parainfluenza Virus 2 NOT DETECTED NOT DETECTED Final   Parainfluenza Virus 3 NOT DETECTED NOT DETECTED Final   Parainfluenza Virus 4 NOT DETECTED NOT DETECTED Final   Respiratory Syncytial Virus NOT DETECTED NOT DETECTED Final   Bordetella pertussis NOT DETECTED NOT DETECTED Final    Bordetella Parapertussis NOT DETECTED NOT DETECTED Final   Chlamydophila pneumoniae NOT DETECTED NOT DETECTED Final   Mycoplasma pneumoniae NOT DETECTED NOT DETECTED Final    Comment: Performed at [facility], [address] [place], [place] [address]    Labs: CBC: No results for input(s): WBC, NEUTROABS, HGB, HCT, MCV, PLT in the last 168 hours.  Basic Metabolic Panel: No results for input(s): NA, K, CL, CO2, GLUCOSE, BUN, CREATININE, CALCIUM, MG, PHOS in the last 168 hours.  Liver Function Tests: No results for input(s): AST, ALT, ALKPHOS, BILITOT, PROT, ALBUMIN in the last 168 hours.  CBG: No results for input(s): GLUCAP in the last 168 hours.  Discharge time spent: less than 30 minutes.  Signed:  Ezekiel Slocumb MD.  [facility] 09/14/2021

## 2021-08-21 NOTE — Progress Notes (Signed)
patient has been room air : 96% while in deep sleep pt desat to 78-85 on RA. Put him on 2L and o2 fluctuate been 88-91 in deep sleep. Babs Bertin DO made aware.

## 2021-08-21 NOTE — Progress Notes (Signed)
Progress Note  Patient Name: Jordan [name] Date of Encounter: 08/21/2021  [facility] Cardiologist: None   Subjective   Hypoxic over night, started on oxygen.   Inpatient Medications    Scheduled Meds:  aspirin EC  81 mg Oral Daily   enoxaparin (LOVENOX) injection  40 mg Subcutaneous Q24H   furosemide  20 mg Intravenous Q12H   irbesartan  75 mg Oral Daily   pravastatin  20 mg Oral QPM   sodium chloride flush  3 mL Intravenous Q12H   Continuous Infusions:  PRN Meds: acetaminophen **OR** acetaminophen, guaiFENesin-dextromethorphan, ondansetron **OR** ondansetron (ZOFRAN) IV   Vital Signs    Vitals:   08/20/21 0811 08/20/21 1219 08/20/21 2037 08/21/21 0541  BP: 120/61 (!) 134/55 119/65 (!) 127/59  Pulse: 66 71 66 68  Resp: 19 18 17 16   Temp: 97.9 F (36.6 C)  98.3 F (36.8 C) 97.8 F (36.6 C)  TempSrc: Oral  Oral Oral  SpO2: 93% 94% 91% 94%  Weight:    77.7 kg  Height:        Intake/Output Summary (Last 24 hours) at 08/21/2021 0757 Last data filed at 08/21/2021 0000 Gross per 24 hour  Intake --  Output 1425 ml  Net -1425 ml   Last 3 Weights 08/21/2021 08/20/2021 08/19/2021  Weight (lbs) 171 lb 6.4 oz 178 lb 5.6 oz 178 lb  Weight (kg) 77.747 kg 80.9 kg 80.74 kg      Telemetry    Personally Reviewed  ECG     Physical Exam   GEN: No acute distress.   Neck: No JVD Cardiac: RRR, no murmurs, rubs, or gallops.  Respiratory: Clear to auscultation bilaterally. GI: Soft, nontender, non-distended  MS: No edema; No deformity. Neuro:  Nonfocal  Psych: Normal affect   Labs    High Sensitivity Troponin:   Recent Labs  Lab 08/19/21 1425 08/19/21 1611 08/19/21 2203 08/20/21 0224  TROPONINIHS 22* 21* 33* 31*     Chemistry Recent Labs  Lab 08/19/21 1425 08/19/21 2203 08/20/21 0224 08/21/21 0414  NA 137 136 138 138  K 3.9 3.9 4.0 3.8  CL 101 101 101 101  CO2 26 27 27 30   GLUCOSE 76 150* 101* 106*  BUN 13 17 18 17   CREATININE 0.74 1.04  1.03 1.03  CALCIUM 8.4* 8.4* 8.2* 8.5*  MG  --   --  2.2  --   PROT 6.4*  --   --   --   ALBUMIN 3.6  --   --   --   AST 21  --   --   --   ALT 14  --   --   --   ALKPHOS 69  --   --   --   BILITOT 0.8  --   --   --   GFRNONAA >60 >60 >60 >60  ANIONGAP 10 8 10 7     Lipids No results for input(s): CHOL, TRIG, HDL, LABVLDL, LDLCALC, CHOLHDL in the last 168 hours.  Hematology Recent Labs  Lab 08/19/21 1425 08/19/21 2203 08/20/21 0224  WBC 6.4 6.8 7.3  RBC 4.32 4.57 4.30  HGB 13.5 14.3 13.6  HCT 40.4 43.2 40.4  MCV 93.5 94.5 94.0  MCH 31.3 31.3 31.6  MCHC 33.4 33.1 33.7  RDW 13.7 13.5 13.5  PLT 187 222 205   Thyroid No results for input(s): TSH, FREET4 in the last 168 hours.  BNP Recent Labs  Lab 08/19/21 1425  BNP 646.2*  DDimer  Recent Labs  Lab 08/20/21 0224  DDIMER 1.28*     Radiology    DG Chest 2 View  Result Date: 08/19/2021 CLINICAL DATA:  Shortness of breath.  Cough. EXAM: CHEST - 2 VIEW COMPARISON:  CT chest 07/24/2019 FINDINGS: Normal heart size. Status post median sternotomy and CABG procedure. A loop recorder is identified in the projection of the left heart border. The lungs appear hyperinflated. Pulmonary vascular congestion. No pleural effusion or interstitial edema. No airspace consolidation. The visualized osseous structures appear intact. IMPRESSION: Pulmonary vascular congestion. Electronically Signed   By: Kerby Moors M.D.   On: 08/19/2021 11:28   CT Angio Chest Pulmonary Embolism (PE) W or WO Contrast  Result Date: 08/20/2021 CLINICAL DATA:  Chest pain or SOB, pleurisy or effusion suspected EXAM: CT ANGIOGRAPHY CHEST WITH CONTRAST TECHNIQUE: Multidetector CT imaging of the chest was performed using the standard protocol during bolus administration of intravenous contrast. Multiplanar CT image reconstructions and MIPs were obtained to evaluate the vascular anatomy. CONTRAST:  127mL OMNIPAQUE IOHEXOL 350 MG/ML SOLN COMPARISON:  None. FINDINGS:  Cardiovascular: Satisfactory opacification of the pulmonary arteries to the segmental level. No evidence of pulmonary embolism. Slightly limited evaluation of the subsegmental level due to timing of contrast and consolidation. Normal heart size. No significant pericardial effusion. The thoracic aorta is normal in caliber. At least mild atherosclerotic plaque of the thoracic aorta. Four-vessel coronary artery calcifications status post coronary artery bypass graft. Mediastinum/Nodes: Enlarged 1.1 cm right hilar lymph node. No enlarged mediastinal, hilar, or axillary lymph nodes. Thyroid gland, trachea, and esophagus demonstrate no significant findings. Lungs/Pleura: Bilateral lower lobe, right greater than left, patchy airspace opacities. No pulmonary nodule. No pulmonary mass. No pleural effusion. No pneumothorax. Mild diffuse bronchial wall thickening. Upper Abdomen: Splenule is noted.  No acute abnormality. Musculoskeletal: No abdominal wall hernia or abnormality. No suspicious lytic or blastic osseous lesions. No acute displaced fracture. Multilevel degenerative changes of the spine. Review of the MIP images confirms the above findings. IMPRESSION: 1. No central or segmental pulmonary embolus. 2. Bilateral lower lobe, right greater than left, patchy airspace opacities likely representing combination of atelectasis and/or infection/inflammation. 3. Possibly reactive 1.1 cm right hilar lymph node. Attention on follow-up. 4.  Aortic Atherosclerosis (ICD10-I70.0). Electronically Signed   By: Iven Finn M.D.   On: 08/20/2021 16:23   ECHOCARDIOGRAM COMPLETE  Result Date: 08/20/2021    ECHOCARDIOGRAM REPORT   Patient Name:   Jordan [name] Date of Exam: 08/20/2021 Medical Rec #:  [id number]            Height:       67.0 in Accession #:    [id number]           Weight:       178.4 lb Date of Birth:  1941-05-29             BSA:          1.926 m Patient Age:    81 years             BP:           132/66 mmHg  Patient Gender: M                    HR:           76 bpm. Exam Location:  Inpatient Procedure: 2D Echo, Cardiac Doppler and Color Doppler Indications:    CHF  History:  Patient has prior history of Echocardiogram examinations, most                 recent 05/21/2019. Stroke. Constant coughing.  Sonographer:    Merrie Roof RDCS Referring Phys: [id number] [place]  1. Left ventricular ejection fraction, by estimation, is 60 to 65%. The left ventricle has normal function. The left ventricle has no regional wall motion abnormalities. Left ventricular diastolic parameters were normal.  2. Right ventricular systolic function is normal. The right ventricular size is normal.  3. Left atrial size was moderately dilated.  4. The mitral valve is abnormal. Trivial mitral valve regurgitation. No evidence of mitral stenosis.  5. The aortic valve is tricuspid. There is mild calcification of the aortic valve. Aortic valve regurgitation is not visualized. Aortic valve sclerosis is present, with no evidence of aortic valve stenosis.  6. The inferior vena cava is normal in size with greater than 50% respiratory variability, suggesting right atrial pressure of 3 mmHg. FINDINGS  Left Ventricle: Left ventricular ejection fraction, by estimation, is 60 to 65%. The left ventricle has normal function. The left ventricle has no regional wall motion abnormalities. The left ventricular internal cavity size was normal in size. There is  no left ventricular hypertrophy. Left ventricular diastolic parameters were normal. Right Ventricle: The right ventricular size is normal. No increase in right ventricular wall thickness. Right ventricular systolic function is normal. Left Atrium: Left atrial size was moderately dilated. Right Atrium: Right atrial size was normal in size. Pericardium: There is no evidence of pericardial effusion. Mitral Valve: The mitral valve is abnormal. There is mild thickening of the mitral valve  leaflet(s). Trivial mitral valve regurgitation. No evidence of mitral valve stenosis. Tricuspid Valve: The tricuspid valve is normal in structure. Tricuspid valve regurgitation is mild . No evidence of tricuspid stenosis. Aortic Valve: The aortic valve is tricuspid. There is mild calcification of the aortic valve. Aortic valve regurgitation is not visualized. Aortic valve sclerosis is present, with no evidence of aortic valve stenosis. Aortic valve mean gradient measures 4.0 mmHg. Aortic valve peak gradient measures 7.4 mmHg. Aortic valve area, by VTI measures 2.49 cm. Pulmonic Valve: The pulmonic valve was normal in structure. Pulmonic valve regurgitation is not visualized. No evidence of pulmonic stenosis. Aorta: The aortic root is normal in size and structure. Venous: The inferior vena cava is normal in size with greater than 50% respiratory variability, suggesting right atrial pressure of 3 mmHg. IAS/Shunts: No atrial level shunt detected by color flow Doppler.  LEFT VENTRICLE PLAX 2D LVIDd:         4.50 cm   Diastology LVIDs:         3.00 cm   LV e' medial:    9.37 cm/s LV PW:         1.10 cm   LV E/e' medial:  9.5 LV IVS:        0.80 cm   LV e' lateral:   18.80 cm/s LVOT diam:     2.00 cm   LV E/e' lateral: 4.8 LV SV:         72 LV SV Index:   38 LVOT Area:     3.14 cm  RIGHT VENTRICLE             IVC RV Basal diam:  4.00 cm     IVC diam: 2.20 cm RV Mid diam:    2.90 cm RV S prime:     12.90 cm/s TAPSE (M-mode):  1.3 cm LEFT ATRIUM             Index        RIGHT ATRIUM           Index LA diam:        4.50 cm 2.34 cm/m   RA Area:     20.00 cm LA Vol (A2C):   94.7 ml 49.17 ml/m  RA Volume:   51.80 ml  26.89 ml/m LA Vol (A4C):   98.3 ml 51.03 ml/m LA Biplane Vol: 97.0 ml 50.36 ml/m  AORTIC VALVE AV Area (Vmax):    2.36 cm AV Area (Vmean):   2.27 cm AV Area (VTI):     2.49 cm AV Vmax:           136.00 cm/s AV Vmean:          95.000 cm/s AV VTI:            0.290 m AV Peak Grad:      7.4 mmHg AV Mean Grad:       4.0 mmHg LVOT Vmax:         102.00 cm/s LVOT Vmean:        68.700 cm/s LVOT VTI:          0.230 m LVOT/AV VTI ratio: 0.79  AORTA Ao Root diam: 2.90 cm Ao Asc diam:  3.30 cm MITRAL VALVE MV Area (PHT): 3.93 cm    SHUNTS MV Decel Time: 193 msec    Systemic VTI:  0.23 m MV E velocity: 89.40 cm/s  Systemic Diam: 2.00 cm MV A velocity: 54.40 cm/s MV E/A ratio:  1.64 Jenkins Rouge MD Electronically signed by Jenkins Rouge MD Signature Date/Time: 08/20/2021/2:19:52 PM    Final     Cardiac Studies   08/20/2021 echo reviewed Ef normal No significant valvular abnormalities  Patient Profile     81 y.o. 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  Assessment & Plan    #Acute on Chronic Diastolic HF Suspect dyspnea is being driven predominantly by his URI. - transition to low dose daily lasix (40mg  daily) starting tomorrow  #CAD No inpatient ischemic evaluation  #URI  #Cryptogenic Stroke    For questions or updates, please contact [place] Please consult [email or web address] for contact info under        Signed, Vickie Epley, MD  08/21/2021, 7:57 AM

## 2021-08-21 NOTE — Care Management (Signed)
08-21-21 1637 Case Manager received consult for possible PT/OT needs. Patient ambulating in the hall with the mobility specialist Jillian. Patient ambulating without any issues. No home needs identified.

## 2021-08-21 NOTE — Progress Notes (Signed)
TRH night cross cover note:  I was notified by RN that this patient, who has been maintaining oxygen saturations in the mid 90s on room air while awake, intermittently desaturates into the low to mid 80s on room air while asleep, but with subsequent improvement into the 90s after addition of 2 L nasal cannula.    Babs Bertin, DO Hospitalist

## 2021-08-21 NOTE — Progress Notes (Signed)
SATURATION QUALIFICATIONS: (This note is used to comply with regulatory documentation for home oxygen)  Patient Saturations on Room Air at Rest = 93%  Patient Saturations on Room Air while Ambulating = 91%

## 2021-08-24 ENCOUNTER — Other Ambulatory Visit: Payer: Self-pay

## 2021-08-24 ENCOUNTER — Encounter: Payer: Self-pay | Admitting: Student

## 2021-08-24 ENCOUNTER — Ambulatory Visit (INDEPENDENT_AMBULATORY_CARE_PROVIDER_SITE_OTHER): Payer: Medicare Other | Admitting: Student

## 2021-08-24 VITALS — BP 110/52 | HR 60 | Ht 67.0 in | Wt 174.0 lb

## 2021-08-24 DIAGNOSIS — I5032 Chronic diastolic (congestive) heart failure: Secondary | ICD-10-CM | POA: Diagnosis not present

## 2021-08-24 DIAGNOSIS — I639 Cerebral infarction, unspecified: Secondary | ICD-10-CM | POA: Diagnosis not present

## 2021-08-24 MED ORDER — FUROSEMIDE 40 MG PO TABS: 20.0000 mg | ORAL_TABLET | Freq: Every day | ORAL | 3 refills | Status: DC

## 2021-08-24 NOTE — Progress Notes (Signed)
Electrophysiology Office Note Date: 08/24/2021  ID:  Jordan [name], DOB 1941/05/06, MRN [id number]  PCP: Crist Infante, MD Primary Cardiologist: None Electrophysiologist: Will Meredith Leeds, MD   CC: ILR follow-up  Jordan [name] is a 81 y.o. male seen today for Dr. Curt Bears . he presents today for post hospital follow up.  Since discharge from the hospital, patient reports doing very well. Weight has continued to trend down on lasix. No lightheadedness, dizziness, or syncope.   he denies chest pain, palpitations, dyspnea, PND, orthopnea, nausea, vomiting, dizziness, syncope, edema, weight gain, or early satiety.  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  Past Medical History:  Diagnosis Date   Abnormal nuclear stress test 03/05/2017   Bibasilar crackles 02/26/2017   Carotid stenosis, asymptomatic 07/27/2011   DDD (degenerative disc disease)    Dyspnea    Dyspnea on exertion 01/28/2017   ED (erectile dysfunction)    Essential hypertension 01/29/2017   GERD (gastroesophageal reflux disease)    History of hiatal hernia    History of kidney stones    passed stones - no surgery required   History of wheezing 02/26/2017   HTN (hypertension)    Hyperlipidemia    Hyperlipidemia with target low density lipoprotein (LDL) cholesterol less than 70 mg/dL 03/31/2014   Impaired fasting glucose    Knee pain    bilateral   Left main coronary artery disease 03/18/2017   Cardiac Cath: 55% dLM, tandem p-mLAD 75% --> referred for CABG.   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 Cyndia Bent @ [facility]):  LIMA-LAD, SVG-D2, SVG-OM, SVG-PDA (endoscopic SVG harvesting from right leg).   Shoulder pain    Wears hearing aid    Past Surgical History:  Procedure Laterality Date    APPENDECTOMY     2020   BUBBLE STUDY  08/05/2019   Procedure: BUBBLE STUDY;  Surgeon: Buford Dresser, MD;  Location: [facility] ENDOSCOPY;  Service: Cardiovascular;;   Cardiac Stress Test  2008   Normal   CARPAL TUNNEL RELEASE Left [place] Right 2008   CHOLECYSTECTOMY, LAPAROSCOPIC  2021   in [place]  06/2013   Henrene Pastor - polyps   CORONARY ARTERY BYPASS GRAFT N/A 04/04/2017   Procedure: CORONARY ARTERY BYPASS GRAFTING x 4  LIMA-LAD, SVG-D2, SVG-OM, SVG-PDA (endoscopic SVG harvesting from right leg);  Surgeon: Gaye Pollack, MD;  Location: [place];  Service: Open Heart Surgery;  Laterality: N/A;   EXTRACORPOREAL SHOCK WAVE LITHOTRIPSY Right 10/23/2018   Procedure: EXTRACORPOREAL SHOCK WAVE LITHOTRIPSY (ESWL);  Surgeon: Bjorn Loser, MD;  Location: [facility] ORS;  Service: Urology;  Laterality: Right;   HERNIA REPAIR Left    inguinal   HIATAL HERNIA REPAIR     no surgery per pt   KNEE CARTILAGE SURGERY Bilateral    LEFT HEART CATH AND CORONARY ANGIOGRAPHY N/A 03/18/2017   Procedure: Left Heart Cath and Coronary Angiography;  Surgeon: Leonie Man, MD;  Location: [place] CV LAB;  Service: Cardiovascular:  55% dLM, p-mLAD 75% - mLAD 75%.  EF 55-65%. Mod non-obstructive RCA disease --> referred for CABG   LOOP RECORDER INSERTION N/A 08/05/2019   Procedure: LOOP RECORDER INSERTION;  Surgeon: Constance Haw, MD;  Location: [place] CV LAB;  Service: Cardiovascular;  Laterality: N/A;   MEDIAL PARTIAL KNEE REPLACEMENT Right 2010   Partial right knee replacement   NM MYOVIEW LTD  01/2017   INTERMEDIATE RISK: Exercise tolerance was good at 10 minutes, however, fatigue and dyspnea were reporte-d -> hypertensive response to exercise.  58mm Horizontal ST depressions noted during stress in the II, III, aVF, V6, V5 and V4 leads c/w ischemia.   Small siize, moderate severity perfusion defect  mid to distal anterior wall perfusion defect suggestive of ischemia.Marland Kitchen     ROTATOR CUFF REPAIR Left 2008   SHOULDER ARTHROSCOPY W/ ROTATOR CUFF REPAIR Right 06/15/2016   TEE WITHOUT CARDIOVERSION N/A 04/04/2017   Procedure: TRANSESOPHAGEAL ECHOCARDIOGRAM (TEE);  Surgeon: Gaye Pollack, MD;  Location: [place];  Service: Open Heart Surgery;  Laterality: N/A;   TEE WITHOUT CARDIOVERSION N/A 08/05/2019   Procedure: TRANSESOPHAGEAL ECHOCARDIOGRAM (TEE);  Surgeon: Buford Dresser, MD;  Location: [facility] ENDOSCOPY;  Service: Cardiovascular;  Laterality: N/A;   TONSILLECTOMY      Current Outpatient Medications  Medication Sig Dispense Refill   acetaminophen (TYLENOL) 500 MG tablet Take 500 mg by mouth 2 (two) times daily.     amoxicillin-clavulanate (AUGMENTIN) 875-125 MG tablet Take 1 tablet by mouth every 12 (twelve) hours for 4 days. 8 tablet 0   Ascorbic Acid (VITAMIN C) 1000 MG tablet 1 tablet     aspirin EC 81 MG tablet Take 81 mg by mouth daily.     Cholecalciferol (VITAMIN D3) 250 MCG (10000 UT) capsule Take 10,000 Units by mouth 2 (two) times daily.     diphenhydrAMINE-APAP, sleep, (TYLENOL PM EXTRA STRENGTH PO) Take 1 tablet by mouth daily as needed (sleep).     EPINEPHrine 0.3 mg/0.3 mL IJ SOAJ injection Inject 0.3 mg into the muscle as needed for anaphylaxis.     ezetimibe (ZETIA) 10 MG tablet Take 10 mg by mouth daily.      furosemide (LASIX) 40 MG tablet Take 1 tablet (40 mg total) by mouth daily. 30 tablet 1   guaiFENesin-codeine 100-10 MG/5ML syrup Take 10 mLs by mouth 2 (two) times daily as needed for cough (cough interrupting sleep). 120 mL 0   guaiFENesin-dextromethorphan (ROBITUSSIN DM) 100-10 MG/5ML syrup Take 5 mLs by mouth every 4 (four) hours as needed for cough. 118 mL 0   Menthol (ICY HOT BACK) 5 % PTCH Apply 1 patch topically daily as needed (pain).     Menthol, Topical Analgesic, (ICY HOT BACK EXTRA STRENGTH EX) Apply 1 application topically daily as needed (pain).     Multiple Vitamin (MULTIVITAMIN) tablet Take 1 tablet by mouth daily.      OVER THE COUNTER MEDICATION Take 1 Dose by mouth 2 (two) times daily as needed. CBD oil     pantoprazole (PROTONIX) 40 MG tablet Take 40 mg by mouth daily.     pravastatin (PRAVACHOL) 20 MG tablet Take 20 mg by mouth every evening.      tadalafil (ADCIRCA/CIALIS) 20 MG tablet Take 20 mg by mouth daily as needed for erectile dysfunction.     testosterone (ANDROGEL) 50 MG/5GM (1%) GEL Place 5 g onto the skin daily.     valsartan (DIOVAN) 80 MG tablet Take 1 tablet by mouth daily.     Vitamin D, Cholecalciferol, 25 MCG (1000 UT) CAPS Take 1 tablet by mouth daily.     Zinc 30 MG TABS 1 tablet     No current facility-administered medications for this visit.  Allergies:   Nexium [esomeprazole] and Crestor [rosuvastatin calcium]   Social History: Social History   Socioeconomic History   Marital status: Married    Spouse name: Not on file   Number of children: 2   Years of education: Not on file   Highest education level: Master's degree (e.g., MA, MS, MEng, MEd, MSW, MBA)  Occupational History   Occupation: retired    Comment: Press photographer  Tobacco Use   Smoking status: Never   Smokeless tobacco: Never  Vaping Use   Vaping Use: Never used  Substance and Sexual Activity   Alcohol use: Yes    Comment: occasionally   Drug use: No   Sexual activity: Yes  Other Topics Concern   Not on file  Social History Narrative   [name] is a very pleasant married gentleman with 2 children [name] and Dalton) each with 3 children/total 6 grandchildren. He is currently retired now from his original job in [employer], however he still serves on [employer] at [employer] and recently stepped down from [employer] at [employer].   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    For the last several months he has been off caffeine and wine because of GI upset symptoms and palpitations. Also trying to lose weight.     He used to drink maybe 2 glasses of  wine at night, and he is subsequently all quit alcohol.   He is relatively active with exercise but not with routine pattern for standard routine. He does push AND other calisthenics as well as kayaking and bike riding.    Social Determinants of Health   Financial Resource Strain: Not on file  Food Insecurity: Not on file  Transportation Needs: Not on file  Physical Activity: Not on file  Stress: Not on file  Social Connections: Not on file  Intimate Partner Violence: Not on file    Family History: Family History  Problem Relation Age of Onset   Heart failure Mother    Cancer Mother        breast   Coronary artery disease Father        CABG   Heart attack Father        Long-term smoker.   Hyperlipidemia Father    Hyperlipidemia Brother    Hypertension Brother    Hyperlipidemia Son    Healthy Daughter    Colon cancer Neg Hx    Colon polyps Neg Hx    Rectal cancer Neg Hx    Stomach cancer Neg Hx      Review of Systems: All other systems reviewed and are otherwise negative except as noted above.  Physical Exam: Vitals:   08/24/21 1145  BP: (!) 110/52  Pulse: 60  SpO2: 92%  Weight: 174 lb (78.9 kg)  Height: 5\' 7"  (1.702 m)     GEN- The patient is well appearing, alert and oriented x 3 today.   HEENT: normocephalic, atraumatic; sclera clear, conjunctiva pink; hearing intact; oropharynx clear; neck supple  Lungs- Clear to ausculation bilaterally, normal work of breathing.  No wheezes, rales, rhonchi Heart- Regular rate and rhythm, no murmurs, rubs or gallops  GI- soft, non-tender, non-distended, bowel sounds present  Extremities- no clubbing, cyanosis, or edema  MS- no significant deformity or atrophy Skin- warm and dry, no rash or lesion; PPM pocket well healed Psych- euthymic mood, full affect Neuro- strength and sensation are intact  EKG:  EKG is  not ordered today.  Recent Labs: 08/19/2021: ALT 14; B Natriuretic Peptide 646.2 08/20/2021: Hemoglobin 13.6;  Magnesium 2.2; Platelets 205 08/21/2021: BUN 17; Creatinine, Ser 1.03; Potassium 3.8; Sodium 138   Wt Readings from Last 3 Encounters:  08/24/21 174 lb (78.9 kg)  08/21/21 171 lb 6.4 oz (77.7 kg)  08/19/21 178 lb (80.7 kg)     Other studies Reviewed: Additional studies/ records that were reviewed today include: Echo 08/2020 shows LVEF 60-65%, Previous EP office notes, Previous remote checks, Most recent labwork.   Assessment and Plan:  1. Cryptogenic Stroke s/p Medtronic Loop recorder Monitor has not transmitted, likely as they have spent a significant amount of time out of town since Christmas. He will check for it at home and let us know if it has been damaged or lost. Last report 08/10/21 with newest data 07/31/2021.   2. Chronic diastolic CHF Resolved on lasix 40 mg daily 14 lbs down in 2 weeks since symptoms start.  Decrease lasix to 20 mg daily. Reviewed sliding scale diuretics. Should stop lasix if weight continues to trend down, especially in setting of any lightheadedness or dizziness.   Current medicines are reviewed at length with the patient today.     Disposition:   Follow up with EP APP in 2 Weeks with labs   Signed, Annamaria Helling  08/24/2021 12:02 PM  [place] [address] [phone] (office) [phone] (fax)

## 2021-08-24 NOTE — Patient Instructions (Signed)
Medication Instructions:  Your physician has recommended you make the following change in your medication:   DECREASE: Furosemide to 20mg  daily  *If you need a refill on your cardiac medications before your next appointment, please call your pharmacy*   Lab Work: None If you have labs (blood work) drawn today and your tests are completely normal, you will receive your results only by: [place] (if you have MyChart) OR A paper copy in the mail If you have any lab test that is abnormal or we need to change your treatment, we will call you to review the results.   Follow-Up: At [facility], you and your health needs are our priority.  As part of our continuing mission to provide you with exceptional heart care, we have created designated Provider Care Teams.  These Care Teams include your primary Cardiologist (physician) and Advanced Practice Providers (APPs -  Physician Assistants and Nurse Practitioners) who all work together to provide you with the care you need, when you need it.   Your next appointment:   09/07/2021

## 2021-08-29 NOTE — Progress Notes (Signed)
[facility] Summary Report / Loop Recorder 

## 2021-08-31 DIAGNOSIS — K219 Gastro-esophageal reflux disease without esophagitis: Secondary | ICD-10-CM | POA: Diagnosis not present

## 2021-08-31 DIAGNOSIS — R0600 Dyspnea, unspecified: Secondary | ICD-10-CM | POA: Diagnosis not present

## 2021-08-31 DIAGNOSIS — E785 Hyperlipidemia, unspecified: Secondary | ICD-10-CM | POA: Diagnosis not present

## 2021-08-31 DIAGNOSIS — I2510 Atherosclerotic heart disease of native coronary artery without angina pectoris: Secondary | ICD-10-CM | POA: Diagnosis not present

## 2021-08-31 DIAGNOSIS — I5033 Acute on chronic diastolic (congestive) heart failure: Secondary | ICD-10-CM | POA: Diagnosis not present

## 2021-08-31 DIAGNOSIS — I10 Essential (primary) hypertension: Secondary | ICD-10-CM | POA: Diagnosis not present

## 2021-08-31 DIAGNOSIS — I5032 Chronic diastolic (congestive) heart failure: Secondary | ICD-10-CM | POA: Diagnosis not present

## 2021-09-06 NOTE — Progress Notes (Signed)
Electrophysiology Office Note Date: 09/07/2021  ID:  Jordan [name], DOB [birth year], MRN [id number]  PCP: Crist Infante, MD Primary Cardiologist: None Electrophysiologist: Will Meredith Leeds, MD   CC: ILR follow-up  Jordan [name] is a 81 y.o. male seen today for Dr. Curt Bears.  He presents today for routine follow up after diuretic adjustment.  He is feeling great. He denies symptoms of palpitations, chest pain, shortness of breath, orthopnea, PND, lower extremity edema, claudication, dizziness, presyncope, syncope, bleeding, or neurologic sequela. The patient is tolerating medications without difficulties.   Would like to come off lasix. Doesn't follow with a gen cards, and would prefer to keep follow up with only PCP for now.   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  Past Medical History:  Diagnosis Date   Abnormal nuclear stress test 03/05/2017   Bibasilar crackles 02/26/2017   Carotid stenosis, asymptomatic 07/27/2011   DDD (degenerative disc disease)    Dyspnea    Dyspnea on exertion 01/28/2017   ED (erectile dysfunction)    Essential hypertension 01/29/2017   GERD (gastroesophageal reflux disease)    History of hiatal hernia    History of kidney stones    passed stones - no surgery required   History of wheezing 02/26/2017   HTN (hypertension)    Hyperlipidemia    Hyperlipidemia with target low density lipoprotein (LDL) cholesterol less than 70 mg/dL 03/31/2014   Impaired fasting glucose    Knee pain    bilateral   Left main coronary artery disease 03/18/2017   Cardiac Cath: 55% dLM, tandem p-mLAD 75% --> referred for CABG.   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 Cyndia Bent @ [facility]):  LIMA-LAD, SVG-D2, SVG-OM, SVG-PDA (endoscopic SVG  harvesting from right leg).   Shoulder pain    Wears hearing aid    Past Surgical History:  Procedure Laterality Date   APPENDECTOMY     2020   BUBBLE STUDY  08/05/2019   Procedure: BUBBLE STUDY;  Surgeon: Buford Dresser, MD;  Location[facility];  Service: Cardiovascular;;   Cardiac Stress Test  2008   Normal   CARPAL TUNNEL RELEASE Left [place] Right 2008   CHOLECYSTECTOMY, LAPAROSCOPIC  2021   in [place]  06/2013   Henrene Pastor - polyps   CORONARY ARTERY BYPASS GRAFT N/A 04/04/2017   Procedure: CORONARY ARTERY BYPASS GRAFTING x 4  LIMA-LAD, SVG-D2, SVG-OM, SVG-PDA (endoscopic SVG harvesting from right leg);  Surgeon: Gaye Pollack, MD;  Location: [place];  Service: Open Heart Surgery;  Laterality: N/A;   EXTRACORPOREAL SHOCK WAVE LITHOTRIPSY Right 10/23/2018   Procedure: EXTRACORPOREAL SHOCK WAVE LITHOTRIPSY (ESWL);  Surgeon: Bjorn Loser, MD;  Location: [facility] OR[facility];  Service: Urology;  Laterality: Right;   HERNIA REPAIR Left    inguinal   HIATAL HERNIA REPAIR     no surgery per pt   KNEE CARTILAGE SURGERY Bilateral    LEFT HEART CATH AND CORONARY ANGIOGRAPHY N/A 03/18/2017   Procedure: Left Heart Cath and Coronary Angiography;  Surgeon: Leonie Man, MD;  Location: [place] CV LAB;  Service: Cardiovascular:  55% dLM, p-mLAD 75% - mLAD 75%.  EF 55-65%. Mod non-obstructive RCA disease --> referred for CABG  LOOP RECORDER INSERTION N/A 08/05/2019   Procedure: LOOP RECORDER INSERTION;  Surgeon: Constance Haw, MD;  Location: [place] CV [facility];  Service: Cardiovascular;  Laterality: N/A;   MEDIAL PARTIAL KNEE REPLACEMENT Right 2010   Partial right knee replacement   NM MYOVIEW LTD  01/2017   INTERMEDIATE RISK: Exercise tolerance was good at 10 minutes, however, fatigue and dyspnea were reporte-d -> hypertensive response to exercise.  62mm Horizontal ST depressions noted during stress in the II, III, aVF, V6, V5 and V4 leads c/w  ischemia.   Small siize, moderate severity perfusion defect  mid to distal anterior wall perfusion defect suggestive of ischemia.Marland Kitchen    ROTATOR CUFF REPAIR Left 2008   SHOULDER ARTHROSCOPY W/ ROTATOR CUFF REPAIR Right 06/15/2016   TEE WITHOUT CARDIOVERSION N/A 04/04/2017   Procedure: TRANSESOPHAGEAL ECHOCARDIOGRAM (TEE);  Surgeon: Gaye Pollack, MD;  Location: [place];  Service: Open Heart Surgery;  Laterality: N/A;   TEE WITHOUT CARDIOVERSION N/A 08/05/2019   Procedure: TRANSESOPHAGEAL ECHOCARDIOGRAM (TEE);  Surgeon: Buford Dresser, MD;  Location: [facility] ENDOSCOPY;  Service: Cardiovascular;  Laterality: N/A;   TONSILLECTOMY      Current Outpatient Medications  Medication Sig Dispense Refill   Menthol (ICY HOT BACK) 5 % PTCH Apply 1 patch topically daily as needed (pain).     acetaminophen (TYLENOL) 500 MG tablet Take 500 mg by mouth as needed.     Ascorbic Acid (VITAMIN C) 1000 MG tablet 1 tablet     aspirin EC 81 MG tablet Take 81 mg by mouth daily.     diphenhydrAMINE-APAP, sleep, (TYLENOL PM EXTRA STRENGTH PO) Take 1 tablet by mouth daily as needed (sleep).     EPINEPHrine 0.3 mg/0.3 mL IJ SOAJ injection Inject 0.3 mg into the muscle as needed for anaphylaxis.     ezetimibe (ZETIA) 10 MG tablet Take 10 mg by mouth daily.      furosemide (LASIX) 40 MG tablet Take 0.5 tablets (20 mg total) by mouth daily. 45 tablet 3   Multiple Vitamin (MULTIVITAMIN) tablet Take 1 tablet by mouth daily.     OVER THE COUNTER MEDICATION Take 1 Dose by mouth 2 (two) times daily as needed. CBD oil     pantoprazole (PROTONIX) 40 MG tablet Take 40 mg by mouth daily.     pravastatin (PRAVACHOL) 20 MG tablet Take 20 mg by mouth every evening.      tadalafil (ADCIRCA/CIALIS) 20 MG tablet Take 20 mg by mouth daily as needed for erectile dysfunction.     testosterone (ANDROGEL) 50 MG/5GM (1%) GEL Place 5 g onto the skin daily.     valsartan (DIOVAN) 80 MG tablet Take 1 tablet by mouth daily.     Zinc 30 MG TABS 1  tablet     No current facility-administered medications for this visit.    Allergies:   Nexium [esomeprazole] and Crestor [rosuvastatin calcium]   Social History: Social History   Socioeconomic History   Marital status: Married    Spouse name: Not on file   Number of children: 2   Years of education: Not on file   Highest education level: Master's degree (e.g., MA, MS, MEng, MEd, MSW, MBA)  Occupational History   Occupation: retired    Comment: Press photographer  Tobacco Use   Smoking status: Never   Smokeless tobacco: Never  Vaping Use   Vaping Use: Never used  Substance and Sexual Activity   Alcohol use: Yes    Comment: occasionally   Drug use: No  Sexual activity: Yes  Other Topics Concern   Not on file  Social History Narrative   [name] is a very pleasant married gentleman with 2 children [name] and [place]) each with 3 children/total 6 grandchildren. He is currently retired now from his original job in [employer], however he still serves on [employer] at [employer] and recently stepped down from [employer] at [employer].   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    For the last several months he has been off caffeine and wine because of GI upset symptoms and palpitations. Also trying to lose weight.     He used to drink maybe 2 glasses of wine at night, and he is subsequently all quit alcohol.   He is relatively active with exercise but not with routine pattern for standard routine. He does push AND other calisthenics as well as kayaking and bike riding.    Social Determinants of Health   Financial Resource Strain: Not on file  Food Insecurity: Not on file  Transportation Needs: Not on file  Physical Activity: Not on file  Stress: Not on file  Social Connections: Not on file  Intimate Partner Violence: Not on file    Family History: Family History  Problem Relation Age of Onset   Heart failure Mother     Cancer Mother        breast   Coronary artery disease Father        CABG   Heart attack Father        Long-term smoker.   Hyperlipidemia Father    Hyperlipidemia Brother    Hypertension Brother    Hyperlipidemia Son    Healthy Daughter    Colon cancer Neg Hx    Colon polyps Neg Hx    Rectal cancer Neg Hx    Stomach cancer Neg Hx      Review of Systems: Review of systems complete and found to be negative unless listed in HPI.    Physical Exam: Vitals:   09/07/21 1141  BP: (!) 106/57  Pulse: 66  SpO2: 98%  Weight: 174 lb (78.9 kg)  Height: 5\' 7"  (1.702 m)   Wt Readings from Last 3 Encounters:  09/07/21 174 lb (78.9 kg)  08/24/21 174 lb (78.9 kg)  08/21/21 171 lb 6.4 oz (77.7 kg)     General:  Well appearing. No resp difficulty. HEENT: Normal Neck: Supple. JVP 5-6. Carotids 2+ bilat; no bruits. No thyromegaly or nodule noted. Cor: PMI nondisplaced. RRR, No M/G/R noted Lungs: CTAB, normal effort. Abdomen: Soft, non-tender, non-distended, no HSM. No bruits or masses. +BS   Extremities: No cyanosis, clubbing, or rash. R and LLE no edema.  Neuro: Alert & orientedx3, cranial nerves grossly intact. moves all 4 extremities w/o difficulty. Affect pleasant   EKG:  EKG is not ordered today.  Recent Labs: 08/19/2021: ALT 14; B Natriuretic Peptide 646.2 08/20/2021: Hemoglobin 13.6; Magnesium 2.2; Platelets 205 08/21/2021: BUN 17; Creatinine, Ser 1.03; Potassium 3.8; Sodium 138   Wt Readings from Last 3 Encounters:  09/07/21 174 lb (78.9 kg)  08/24/21 174 lb (78.9 kg)  08/21/21 171 lb 6.4 oz (77.7 kg)     Other studies Reviewed: Additional studies/ records that were reviewed today include: Echo 08/2020 shows LVEF 60-65%, Previous EP office notes, Previous remote checks, Most recent labwork.   Assessment and Plan:  1. Cryptogenic Stroke s/p Medtronic Loop recorder App had disconnected after update.  Fixed in office today.  No episodes on check.    2. Chronic diastolic  CHF Change lasix to 20 mg as needed.  Echo 08/20/2021 LVEF 60-65%  3. H/o CAD Denies s/s ischemia Recommended regular follow up with gen cards (would need to establish). Pt declines for now with no symptoms.   Current medicines are reviewed at length with the patient today.     Disposition:   Follow up with EP prn. Continue to monitor loop with h/o CVA.    Jacalyn Lefevre, PA-C  09/07/2021 11:47 AM  [facility] [address] [place] [address] [phone] (office) [phone] (fax)

## 2021-09-07 ENCOUNTER — Other Ambulatory Visit: Payer: Self-pay

## 2021-09-07 ENCOUNTER — Ambulatory Visit (INDEPENDENT_AMBULATORY_CARE_PROVIDER_SITE_OTHER): Payer: Medicare Other | Admitting: Student

## 2021-09-07 ENCOUNTER — Encounter: Payer: Self-pay | Admitting: Student

## 2021-09-07 VITALS — BP 106/57 | HR 66 | Ht 67.0 in | Wt 174.0 lb

## 2021-09-07 DIAGNOSIS — I639 Cerebral infarction, unspecified: Secondary | ICD-10-CM

## 2021-09-07 DIAGNOSIS — I5032 Chronic diastolic (congestive) heart failure: Secondary | ICD-10-CM

## 2021-09-07 MED ORDER — FUROSEMIDE 40 MG PO TABS: 20.0000 mg | ORAL_TABLET | ORAL | 3 refills | Status: DC | PRN

## 2021-09-07 NOTE — Patient Instructions (Signed)
Medication Instructions:  Your physician has recommended you make the following change in your medication:   Take Furosemide only as needed  *If you need a refill on your cardiac medications before your next appointment, please call your pharmacy*   Lab Work: None  If you have labs (blood work) drawn today and your tests are completely normal, you will receive your results only by: [place] (if you have MyChart) OR A paper copy in the mail If you have any lab test that is abnormal or we need to change your treatment, we will call you to review the results.   Follow-Up: At [facility], you and your health needs are our priority.  As part of our continuing mission to provide you with exceptional heart care, we have created designated Provider Care Teams.  These Care Teams include your primary Cardiologist (physician) and Advanced Practice Providers (APPs -  Physician Assistants and Nurse Practitioners) who all work together to provide you with the care you need, when you need it.   Your next appointment:   As needed

## 2021-09-18 ENCOUNTER — Ambulatory Visit (INDEPENDENT_AMBULATORY_CARE_PROVIDER_SITE_OTHER): Payer: Medicare Other

## 2021-09-18 DIAGNOSIS — I639 Cerebral infarction, unspecified: Secondary | ICD-10-CM

## 2021-09-18 LAB — CUP PACEART REMOTE DEVICE CHECK
Date Time Interrogation Session: 20230129231628
Implantable Pulse Generator Implant Date: 20201216

## 2021-09-26 NOTE — Progress Notes (Signed)
[facility] Summary Report / Loop Recorder 

## 2021-10-23 ENCOUNTER — Ambulatory Visit (INDEPENDENT_AMBULATORY_CARE_PROVIDER_SITE_OTHER): Payer: Medicare Other

## 2021-10-23 DIAGNOSIS — I639 Cerebral infarction, unspecified: Secondary | ICD-10-CM | POA: Diagnosis not present

## 2021-10-24 LAB — CUP PACEART REMOTE DEVICE CHECK
Date Time Interrogation Session: 20230303230312
Implantable Pulse Generator Implant Date: 20201216

## 2021-10-31 DIAGNOSIS — H6123 Impacted cerumen, bilateral: Secondary | ICD-10-CM | POA: Diagnosis not present

## 2021-11-03 NOTE — Progress Notes (Signed)
[facility] Summary Report / Loop Recorder 

## 2021-11-17 DIAGNOSIS — E785 Hyperlipidemia, unspecified: Secondary | ICD-10-CM | POA: Diagnosis not present

## 2021-11-17 DIAGNOSIS — M1990 Unspecified osteoarthritis, unspecified site: Secondary | ICD-10-CM | POA: Diagnosis not present

## 2021-11-17 DIAGNOSIS — I2510 Atherosclerotic heart disease of native coronary artery without angina pectoris: Secondary | ICD-10-CM | POA: Diagnosis not present

## 2021-11-17 DIAGNOSIS — I10 Essential (primary) hypertension: Secondary | ICD-10-CM | POA: Diagnosis not present

## 2021-11-27 ENCOUNTER — Ambulatory Visit (INDEPENDENT_AMBULATORY_CARE_PROVIDER_SITE_OTHER): Payer: Medicare Other

## 2021-11-27 DIAGNOSIS — I639 Cerebral infarction, unspecified: Secondary | ICD-10-CM | POA: Diagnosis not present

## 2021-11-29 LAB — CUP PACEART REMOTE DEVICE CHECK
Date Time Interrogation Session: 20230405231002
Implantable Pulse Generator Implant Date: 20201216

## 2021-12-14 NOTE — Progress Notes (Signed)
[facility] Summary Report / Loop Recorder 

## 2022-01-01 ENCOUNTER — Ambulatory Visit (INDEPENDENT_AMBULATORY_CARE_PROVIDER_SITE_OTHER): Payer: Medicare Other

## 2022-01-01 DIAGNOSIS — I639 Cerebral infarction, unspecified: Secondary | ICD-10-CM

## 2022-01-03 LAB — CUP PACEART REMOTE DEVICE CHECK
Date Time Interrogation Session: 20230510230509
Implantable Pulse Generator Implant Date: 20201216

## 2022-01-08 DIAGNOSIS — Z711 Person with feared health complaint in whom no diagnosis is made: Secondary | ICD-10-CM | POA: Diagnosis not present

## 2022-01-08 DIAGNOSIS — I10 Essential (primary) hypertension: Secondary | ICD-10-CM | POA: Diagnosis not present

## 2022-01-09 DIAGNOSIS — Z85820 Personal history of malignant melanoma of skin: Secondary | ICD-10-CM | POA: Diagnosis not present

## 2022-01-09 DIAGNOSIS — L80 Vitiligo: Secondary | ICD-10-CM | POA: Diagnosis not present

## 2022-01-09 DIAGNOSIS — L570 Actinic keratosis: Secondary | ICD-10-CM | POA: Diagnosis not present

## 2022-01-09 DIAGNOSIS — D225 Melanocytic nevi of trunk: Secondary | ICD-10-CM | POA: Diagnosis not present

## 2022-01-09 DIAGNOSIS — D1722 Benign lipomatous neoplasm of skin and subcutaneous tissue of left arm: Secondary | ICD-10-CM | POA: Diagnosis not present

## 2022-01-09 DIAGNOSIS — Z85828 Personal history of other malignant neoplasm of skin: Secondary | ICD-10-CM | POA: Diagnosis not present

## 2022-01-09 DIAGNOSIS — L821 Other seborrheic keratosis: Secondary | ICD-10-CM | POA: Diagnosis not present

## 2022-01-19 NOTE — Progress Notes (Signed)
[facility] Summary Report / Loop Recorder 

## 2022-01-29 DIAGNOSIS — R7301 Impaired fasting glucose: Secondary | ICD-10-CM | POA: Diagnosis not present

## 2022-01-29 DIAGNOSIS — Z23 Encounter for immunization: Secondary | ICD-10-CM | POA: Diagnosis not present

## 2022-01-29 DIAGNOSIS — D1722 Benign lipomatous neoplasm of skin and subcutaneous tissue of left arm: Secondary | ICD-10-CM | POA: Diagnosis not present

## 2022-01-29 DIAGNOSIS — K219 Gastro-esophageal reflux disease without esophagitis: Secondary | ICD-10-CM | POA: Diagnosis not present

## 2022-01-29 DIAGNOSIS — E291 Testicular hypofunction: Secondary | ICD-10-CM | POA: Diagnosis not present

## 2022-01-29 DIAGNOSIS — D6862 Lupus anticoagulant syndrome: Secondary | ICD-10-CM | POA: Diagnosis not present

## 2022-01-29 DIAGNOSIS — I10 Essential (primary) hypertension: Secondary | ICD-10-CM | POA: Diagnosis not present

## 2022-01-29 DIAGNOSIS — I6340 Cerebral infarction due to embolism of unspecified cerebral artery: Secondary | ICD-10-CM | POA: Diagnosis not present

## 2022-01-29 DIAGNOSIS — I5032 Chronic diastolic (congestive) heart failure: Secondary | ICD-10-CM | POA: Diagnosis not present

## 2022-01-29 DIAGNOSIS — E785 Hyperlipidemia, unspecified: Secondary | ICD-10-CM | POA: Diagnosis not present

## 2022-02-05 ENCOUNTER — Ambulatory Visit (INDEPENDENT_AMBULATORY_CARE_PROVIDER_SITE_OTHER): Payer: Medicare Other

## 2022-02-05 DIAGNOSIS — I639 Cerebral infarction, unspecified: Secondary | ICD-10-CM

## 2022-02-07 LAB — CUP PACEART REMOTE DEVICE CHECK
Date Time Interrogation Session: 20230612230758
Implantable Pulse Generator Implant Date: 20201216

## 2022-02-23 DIAGNOSIS — H6123 Impacted cerumen, bilateral: Secondary | ICD-10-CM | POA: Diagnosis not present

## 2022-02-26 NOTE — Progress Notes (Signed)
[facility] Summary Report / Loop Recorder 

## 2022-03-12 ENCOUNTER — Ambulatory Visit (INDEPENDENT_AMBULATORY_CARE_PROVIDER_SITE_OTHER): Payer: Medicare Other

## 2022-03-12 DIAGNOSIS — I639 Cerebral infarction, unspecified: Secondary | ICD-10-CM | POA: Diagnosis not present

## 2022-03-13 LAB — CUP PACEART REMOTE DEVICE CHECK
Date Time Interrogation Session: 20230715230920
Implantable Pulse Generator Implant Date: 20201216

## 2022-03-28 ENCOUNTER — Ambulatory Visit: Payer: Medicare Other | Admitting: Cardiovascular Disease

## 2022-03-29 DIAGNOSIS — D126 Benign neoplasm of colon, unspecified: Secondary | ICD-10-CM | POA: Diagnosis not present

## 2022-03-29 DIAGNOSIS — K649 Unspecified hemorrhoids: Secondary | ICD-10-CM | POA: Diagnosis not present

## 2022-04-12 DIAGNOSIS — S59811A Other specified injuries right forearm, initial encounter: Secondary | ICD-10-CM | POA: Diagnosis not present

## 2022-04-16 ENCOUNTER — Ambulatory Visit (INDEPENDENT_AMBULATORY_CARE_PROVIDER_SITE_OTHER): Payer: Medicare Other

## 2022-04-16 DIAGNOSIS — I639 Cerebral infarction, unspecified: Secondary | ICD-10-CM

## 2022-04-17 LAB — CUP PACEART REMOTE DEVICE CHECK
Date Time Interrogation Session: 20230817231000
Implantable Pulse Generator Implant Date: 20201216

## 2022-04-20 NOTE — Progress Notes (Signed)
[facility] Summary Report / Loop Recorder 

## 2022-04-25 ENCOUNTER — Ambulatory Visit: Payer: Medicare Other | Attending: Cardiovascular Disease | Admitting: Cardiology

## 2022-04-25 ENCOUNTER — Encounter: Payer: Self-pay | Admitting: Cardiology

## 2022-04-25 VITALS — BP 118/52 | HR 64 | Ht 67.0 in | Wt 179.0 lb

## 2022-04-25 DIAGNOSIS — I10 Essential (primary) hypertension: Secondary | ICD-10-CM | POA: Diagnosis not present

## 2022-04-25 DIAGNOSIS — I639 Cerebral infarction, unspecified: Secondary | ICD-10-CM | POA: Diagnosis not present

## 2022-04-25 DIAGNOSIS — E785 Hyperlipidemia, unspecified: Secondary | ICD-10-CM | POA: Insufficient documentation

## 2022-04-25 DIAGNOSIS — D6861 Antiphospholipid syndrome: Secondary | ICD-10-CM | POA: Diagnosis not present

## 2022-04-25 DIAGNOSIS — I2510 Atherosclerotic heart disease of native coronary artery without angina pectoris: Secondary | ICD-10-CM | POA: Insufficient documentation

## 2022-04-25 DIAGNOSIS — R0609 Other forms of dyspnea: Secondary | ICD-10-CM | POA: Insufficient documentation

## 2022-04-25 DIAGNOSIS — Z951 Presence of aortocoronary bypass graft: Secondary | ICD-10-CM | POA: Insufficient documentation

## 2022-04-25 NOTE — Patient Instructions (Signed)
Medication Instructions:   No changes  *If you need a refill on your cardiac medications before your next appointment, please call your pharmacy*   Lab Work:   Not needed    Testing/Procedures:  Will be schedule at  [place] has requested that you have en exercise stress myoview. Please follow instruction sheet, as given.    Follow-Up: At [facility], you and your health needs are our priority.  As part of our continuing mission to provide you with exceptional heart care, we have created designated Provider Care Teams.  These Care Teams include your primary Cardiologist (physician) and Advanced Practice Providers (APPs -  Physician Assistants and Nurse Practitioners) who all work together to provide you with the care you need, when you need it.  We recommend signing up for the patient portal called "MyChart".  Sign up information is provided on this After Visit Summary.  MyChart is used to connect with patients for Virtual Visits (Telemedicine).  Patients are able to view lab/test results, encounter notes, upcoming appointments, etc.  Non-urgent messages can be sent to your provider as well.   To learn more about what you can do with MyChart, go to [email or web address].    Your next appointment:   4 month(s)  The format for your next appointment:   In Person  Provider:   Glenetta Hew, MD    Other Instructions  Your doctor has scheduled you for a Myocardial Perfusion scan to obtain information about the blood flow to your heart. The test consists of taking pictures of your heart in two phases: while resting and after a stress test.  The stress test may involve walking on a treadmill, or if you are unable to exercise adequately, you will be given a drug intended to have a similar effect on the heart to that of exercise.  The test will take approximately 3 to 4  hours to complete.  If you are pregnant or breastfeeding,  please notify  the staff prior to your test.  How to prepare for your test: Do not eat or drink 2 hours prior to your test Do not consume products containing caffeine 12 hours prior to your test (examples: coffee (regular OR decaf), chocolate, sodas, tea) Your doctor may need you to hold certain medications prior to the test.  If so, these are listed below and should not be taken for 24 hours prior to the test.  If not listed below, you may take your medications as normal.  You may resume taking held medications on your normal schedule once the test is complete.   Meds to hold: none Do bring a list of your current medications with you.  If you have held any meds in preparation for the test, please bring them, as you may be required to take them once the test is completed. Do wear comfortable clothes and walking shoes.  Do not wear dresses or overalls. Do NOT wear cologne, perfume, aftershave, or fragranced lotions the day of your test (deodorants okay). If these instructions are not followed your test will have to be rescheduled.   A nuclear cardiologist will review your test, prepare a report and send it to your physician.   If you have questions or concerns about your appointment, you can call the Nuclear Cardiology department at [phone] x [phone]. If you cannot keep your appointment, please provide 48 hours notification to avoid a possible $50.00 charge to your account.   Please arrive  15 minutes prior to your appointment time for registration and insurance purposes
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
## 2022-04-26 ENCOUNTER — Encounter: Payer: Self-pay | Admitting: Cardiology

## 2022-04-26 NOTE — Assessment & Plan Note (Signed)
As far as I can tell, he has not had any clotting issues.  For reasons stated in HPI, he would not want to be on DOAC.  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  He has not had a clot and therefore not unreasonable to simply continue aspirin.  But defer to primary provider.

## 2022-04-26 NOTE — Assessment & Plan Note (Signed)
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  He says he is slowed down a little bit over the years but really denies true exertional dyspnea.  Because he is so active and does not want to be caught by surprise, he is very anxious today and surveillance Myoview stress testing.  Plan: Myoview stress test (treadmill exercise if possible)

## 2022-04-26 NOTE — Assessment & Plan Note (Signed)
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  He had significant exertional dyspnea/unstable angina that led to abnormal ischemic evaluation and then cath.  He did remarkably well since CABG and really had never lost a step.  Was happy that everything went so well.  He again is very active with no symptoms.  However, he does have grafts, and it has been over 5 years since he has had any evaluation besides an echocardiogram.  Would be due for surveillance Myoview stress test.  We talked about reasonings for surveillance and considering whether or not we will check a based on his age.  Since he is very active he would like to find out about things and not get to a symptomatic standpoint.  Plan:  We Will Check Surveillance Myoview Stress Test-Treadmill if possible  Continue aspirin, ARB and statin along with Zetia for now.  Sees discussion about HLD.

## 2022-04-26 NOTE — Assessment & Plan Note (Signed)
BP well controlled.  He is on modest dose of Diovan with no issues.

## 2022-04-26 NOTE — Assessment & Plan Note (Signed)
Last LDL was not at goal.  He is only on low-dose Pravachol along with ezetimibe.  Apparently he did not tolerate Crestor due to memory issues.  Since he was lost to follow-up, I was not able to further titrate this.  I do not know what his lipids have been running => he should be due to have lipids checked by his PCP soon.  If not at goal, I think we could probably consider increasing pravastatin dose but would have a low threshold to consider inclisiran which will improve lipids and would be easy to maintain.

## 2022-04-26 NOTE — Assessment & Plan Note (Signed)
Stable symptoms.  Remains on aspirin and statin.  Needs better lipid control.  Has not had any ischemic evaluation since CABG.  Due for surveillance Myoview.

## 2022-04-26 NOTE — Assessment & Plan Note (Signed)
Never really had symptoms.  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  ILR has not shown any arrhythmias.  Echo and TEE were normal.  Carotids were relatively normal as well.  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

## 2022-04-30 ENCOUNTER — Telehealth (HOSPITAL_COMMUNITY): Payer: Self-pay | Admitting: *Deleted

## 2022-04-30 NOTE — Telephone Encounter (Signed)
Left message on voicemail per DPR in reference to upcoming appointment scheduled on  05/07/22 with detailed instructions given per Myocardial Perfusion Study Information Sheet for the test. LM to arrive 15 minutes early, and that it is imperative to arrive on time for appointment to keep from having the test rescheduled. If you need to cancel or reschedule your appointment, please call the office within 24 hours of your appointment. Failure to do so may result in a cancellation of your appointment, and a $50 no show fee. Phone number given for call back for any questions. Kirstie Peri

## 2022-05-07 ENCOUNTER — Ambulatory Visit (HOSPITAL_COMMUNITY): Payer: Medicare Other | Attending: Cardiology

## 2022-05-07 DIAGNOSIS — I639 Cerebral infarction, unspecified: Secondary | ICD-10-CM

## 2022-05-07 DIAGNOSIS — E785 Hyperlipidemia, unspecified: Secondary | ICD-10-CM | POA: Diagnosis not present

## 2022-05-07 DIAGNOSIS — I2510 Atherosclerotic heart disease of native coronary artery without angina pectoris: Secondary | ICD-10-CM

## 2022-05-07 DIAGNOSIS — I10 Essential (primary) hypertension: Secondary | ICD-10-CM

## 2022-05-07 DIAGNOSIS — Z951 Presence of aortocoronary bypass graft: Secondary | ICD-10-CM | POA: Diagnosis not present

## 2022-05-07 HISTORY — PX: NM MYOVIEW LTD: HXRAD82

## 2022-05-07 LAB — MYOCARDIAL PERFUSION IMAGING
Angina Index: 0
Duke Treadmill Score: 7
Estimated workload: 8.5
Exercise duration (min): 7 min
Exercise duration (sec): 0 s
LV dias vol: 119 mL (ref 62–150)
LV sys vol: 47 mL
MPHR: 141 {beats}/min
Nuc Stress EF: 60 %
Peak HR: 126 {beats}/min
Percent HR: 89 %
RPE: 19
Rest HR: 52 {beats}/min
Rest Nuclear Isotope Dose: 10.6 mCi
SDS: 1
SRS: 0
SSS: 1
ST Depression (mm): 0 mm
Stress Nuclear Isotope Dose: 30.5 mCi
TID: 0.89

## 2022-05-07 MED ORDER — TECHNETIUM TC 99M TETROFOSMIN IV KIT
30.5000 | PACK | Freq: Once | INTRAVENOUS | Status: AC | PRN
  Administered 2022-05-07: 30.5 via INTRAVENOUS

## 2022-05-07 MED ORDER — TECHNETIUM TC 99M TETROFOSMIN IV KIT
10.6000 | PACK | Freq: Once | INTRAVENOUS | Status: AC | PRN
  Administered 2022-05-07: 10.6 via INTRAVENOUS

## 2022-05-08 ENCOUNTER — Encounter (HOSPITAL_COMMUNITY): Payer: Medicare Other

## 2022-05-11 NOTE — Progress Notes (Signed)
[facility] Summary Report / Loop Recorder 

## 2022-05-27 ENCOUNTER — Emergency Department (HOSPITAL_BASED_OUTPATIENT_CLINIC_OR_DEPARTMENT_OTHER)
Admission: EM | Admit: 2022-05-27 | Discharge: 2022-05-27 | Disposition: A | Discharge status: Home / Self Care | Payer: Medicare Other | Attending: Student | Admitting: Student

## 2022-05-27 ENCOUNTER — Emergency Department (HOSPITAL_BASED_OUTPATIENT_CLINIC_OR_DEPARTMENT_OTHER): Payer: Medicare Other | Admitting: Radiology

## 2022-05-27 ENCOUNTER — Other Ambulatory Visit: Payer: Self-pay

## 2022-05-27 DIAGNOSIS — Y990 Civilian activity done for income or pay: Secondary | ICD-10-CM | POA: Diagnosis not present

## 2022-05-27 DIAGNOSIS — S6992XA Unspecified injury of left wrist, hand and finger(s), initial encounter: Secondary | ICD-10-CM | POA: Diagnosis present

## 2022-05-27 DIAGNOSIS — Z7982 Long term (current) use of aspirin: Secondary | ICD-10-CM | POA: Diagnosis not present

## 2022-05-27 DIAGNOSIS — W293XXA Contact with powered garden and outdoor hand tools and machinery, initial encounter: Secondary | ICD-10-CM | POA: Diagnosis not present

## 2022-05-27 DIAGNOSIS — S62502B Fracture of unspecified phalanx of left thumb, initial encounter for open fracture: Secondary | ICD-10-CM

## 2022-05-27 DIAGNOSIS — Y92007 Garden or yard of unspecified non-institutional (private) residence as the place of occurrence of the external cause: Secondary | ICD-10-CM | POA: Insufficient documentation

## 2022-05-27 DIAGNOSIS — S61012A Laceration without foreign body of left thumb without damage to nail, initial encounter: Secondary | ICD-10-CM | POA: Insufficient documentation

## 2022-05-27 DIAGNOSIS — S62525A Nondisplaced fracture of distal phalanx of left thumb, initial encounter for closed fracture: Secondary | ICD-10-CM | POA: Insufficient documentation

## 2022-05-27 DIAGNOSIS — S62635A Displaced fracture of distal phalanx of left ring finger, initial encounter for closed fracture: Secondary | ICD-10-CM | POA: Diagnosis not present

## 2022-05-27 MED ORDER — CEFAZOLIN SODIUM-DEXTROSE 1-4 GM/50ML-% IV SOLN
1.0000 g | Freq: Once | INTRAVENOUS | Status: AC
Start: 2022-05-27 — End: 2022-05-27
  Administered 2022-05-27: 1 g via INTRAVENOUS
  Filled 2022-05-27: qty 50

## 2022-05-27 MED ORDER — ACETAMINOPHEN 500 MG PO TABS: 1000.0000 mg | ORAL_TABLET | Freq: Four times a day (QID) | ORAL | Status: DC | PRN

## 2022-05-27 MED ORDER — KETOROLAC TROMETHAMINE 30 MG/ML IJ SOLN
15.0000 mg | Freq: Once | INTRAMUSCULAR | Status: AC
  Administered 2022-05-27: 15 mg via INTRAVENOUS
  Filled 2022-05-27: qty 1

## 2022-05-27 NOTE — ED Notes (Signed)
Second call to the on call Hand Specialist; transferred the call to the PA.

## 2022-05-27 NOTE — Discharge Instructions (Addendum)
You have been seen today for your complaint of left thumb laceration. Your imaging showed that you have a fracture as well as the laceration. Follow up with: Your hand surgeon tomorrow at 8:30 AM at his office Please seek immediate medical care if you develop any of the following symptoms: Your finger becomes numb or blue. At this time there does not appear to be the presence of an emergent medical condition, however there is always the potential for conditions to change. Please read and follow the below instructions.  Do not take your medicine if  develop an itchy rash, swelling in your mouth or lips, or difficulty breathing; call 911 and seek immediate emergency medical attention if this occurs.  You may review your lab tests and imaging results in their entirety on your MyChart account.  Please discuss all results of fully with your primary care provider and other specialist at your follow-up visit.  Note: Portions of this text may have been transcribed using voice recognition software. Every effort was made to ensure accuracy; however, inadvertent computerized transcription errors may still be present.

## 2022-05-27 NOTE — ED Triage Notes (Signed)
Patient arrives with complaints of laceration to left thumb.   Patient was working in his yard when he cut his thumb with a limb cutter. Bleeding controlled in triage.   Patient had a tetanus shot earlier this year.

## 2022-05-27 NOTE — ED Provider Notes (Signed)
[facility] Provider Note   CSN: [id number] Arrival date & time: 05/27/22  1338     History  Chief Complaint  Patient presents with   Laceration    Left Thumb    Jordan [name] is a 81 y.o. male.  Who presents ED for evaluation of left thumb laceration.  This occurred while he was cutting a tree with an automatic tree trimmer.  Just prior to arrival.  Bleeding was controlled with pressure.  No numbness or tingling.  Does have full range of motion.  Takes a baby aspirin, no other blood thinners.   Laceration      Home Medications Prior to Admission medications   Medication Sig Start Date End Date Taking? Authorizing Provider  acetaminophen (TYLENOL) 500 MG tablet Take 500 mg by mouth as needed.    [provider]  Ascorbic Acid (VITAMIN C) 1000 MG tablet 1 tablet    [provider]  aspirin EC 81 MG tablet Take 81 mg by mouth daily.    [provider]  diphenhydrAMINE-APAP, sleep, (TYLENOL PM EXTRA STRENGTH PO) Take 1 tablet by mouth daily as needed (sleep).    [provider]  EPINEPHrine 0.3 mg/0.3 mL IJ SOAJ injection Inject 0.3 mg into the muscle as needed for anaphylaxis. 05/06/19   [provider]  ezetimibe (ZETIA) 10 MG tablet Take 10 mg by mouth daily.     [provider]  furosemide (LASIX) 40 MG tablet Take 0.5 tablets (20 mg total) by mouth as needed. 09/07/21   Shirley Friar, PA-C  Menthol (ICY HOT BACK) 5 % PTCH Apply 1 patch topically daily as needed (pain).    [provider]  Multiple Vitamin (MULTIVITAMIN) tablet Take 1 tablet by mouth daily.    [provider]  OVER THE COUNTER MEDICATION Take 1 Dose by mouth 2 (two) times daily as needed. CBD oil    [provider]  pantoprazole (PROTONIX) 40 MG tablet Take 40 mg by mouth daily. 08/05/21   [provider]  pravastatin (PRAVACHOL) 20 MG tablet Take 20 mg by mouth every evening.   10/18/18   [provider]  tadalafil (ADCIRCA/CIALIS) 20 MG tablet Take 20 mg by mouth daily as needed for erectile dysfunction.    [provider]  testosterone (ANDROGEL) 50 MG/5GM (1%) GEL Place 5 g onto the skin daily.    [provider]  valsartan (DIOVAN) 80 MG tablet Take 1 tablet by mouth daily. 03/21/20   [provider]  Zinc 30 MG TABS 1 tablet    [provider]      Allergies    Nexium [esomeprazole] and Crestor [rosuvastatin calcium]    Review of Systems   Review of Systems  Skin:  Positive for wound.  All other systems reviewed and are negative.   Physical Exam Updated Vital Signs BP 137/73 (BP Location: Right Arm)   Pulse (!) 53   Temp (!) 97.5 F (36.4 C) (Temporal)   Resp 20   Ht '5\' 6"'$  (1.676 m)   Wt 77.1 kg   SpO2 100%   BMI 27.44 kg/m  Physical Exam Vitals and nursing note reviewed.  Constitutional:      General: He is not in acute distress.    Appearance: Normal appearance. He is normal weight. He is not ill-appearing.  HENT:     Head: Normocephalic and atraumatic.  Pulmonary:     Effort: Pulmonary effort is normal. No respiratory distress.  Abdominal:     General: Abdomen is flat.  Musculoskeletal:     Right hand: Normal.     Left hand: Deformity, laceration and tenderness present. Normal range of motion. Normal strength. Normal sensation. Normal pulse.     Cervical back: Neck supple.     Comments: 5 cm longitudinal laceration to the dorsal aspect of the left hand.  Strength, sensation and range of motion intact.  Radial pulse 2+.  Skin:    General: Skin is warm and dry.  Neurological:     Mental Status: He is alert and oriented to person, place, and time.  Psychiatric:        Mood and Affect: Mood normal.        Behavior: Behavior normal.      ED Results / Procedures / Treatments   Labs (all labs ordered are listed, but only abnormal results are displayed) Labs Reviewed - No data to  display  EKG None  Radiology DG Hand Complete Left  Result Date: 05/27/2022 CLINICAL DATA:  Severe laceration to thumb with tree limb cutter today. Pain and bleeding. EXAM: LEFT HAND - COMPLETE 3+ VIEW COMPARISON:  None Available. FINDINGS: A longitudinally oriented fracture of the distal phalanx of the thumb is seen with intra-articular extension. No other fractures identified. No evidence of dislocation. IMPRESSION: Longitudinally oriented fracture of the distal phalanx of the thumb, with intra-articular extension. Electronically Signed   By: Marlaine Hind M.D.   On: 05/27/2022 16:32    Procedures .Marland KitchenLaceration Repair  Date/Time: 05/27/2022 6:56 PM  Performed by: Roylene Reason, PA-C Authorized by: Roylene Reason, PA-C   Consent:    Consent obtained:  Verbal   Consent given by:  Patient   Risks discussed:  Infection, need for additional repair, pain, poor cosmetic result and poor wound healing   Alternatives discussed:  No treatment and delayed treatment Universal protocol:    Procedure explained and questions answered to patient or proxy's satisfaction: yes     Relevant documents present and verified: yes     Test results available: yes     Imaging studies available: yes     Required blood products, implants, devices, and special equipment available: yes     Site/side marked: yes     Immediately prior to procedure, a time out was called: yes     Patient identity confirmed:  Verbally with patient Anesthesia:    Anesthesia method:  None Laceration details:    Location:  Hand   Hand location:  L hand, dorsum   Length (cm):  5 Pre-procedure details:    Preparation:  Patient was prepped and draped in usual sterile fashion Exploration:    Hemostasis achieved with:  Tourniquet   Imaging obtained: x-ray     Imaging outcome: foreign body noted     Wound exploration: wound explored through full range of motion and entire depth of wound visualized   Treatment:    Area  cleansed with:  Shur-Clens   Amount of cleaning:  Extensive   Irrigation solution:  Sterile water   Irrigation volume:  1000 cc   Irrigation method:  Pressure wash Skin repair:    Repair method:  Steri-Strips   Number of Steri-Strips:  3 Approximation:    Approximation:  Loose Repair type:    Repair type:  Simple Post-procedure details:    Dressing:  Non-adherent dressing and tube gauze   Procedure completion:  Tolerated     Medications Ordered in ED Medications  acetaminophen (  TYLENOL) tablet 1,000 mg (has no administration in time range)  ceFAZolin (ANCEF) IVPB 1 g/50 mL premix (0 g Intravenous Stopped 05/27/22 1833)  ketorolac (TORADOL) 30 MG/ML injection 15 mg (15 mg Intravenous Given 05/27/22 1849)    ED Course/ Medical Decision Making/ A&P                           Medical Decision Making Amount and/or Complexity of Data Reviewed Radiology: ordered.  Risk OTC drugs. Prescription drug management.  This patient presents to the ED for concern of left thumb laceration, this involves an extensive number of treatment options, and is a complaint that carries with it a high risk of complications and morbidity.  The differential diagnosis includes laceration, open fracture   Additional history obtained from: Nursing notes from this visit.  I ordered imaging studies including x-ray left hand I independently visualized and interpreted imaging which showed longitudinal fracture of the distal phalanx of the left thumb with intra-articular extension I agree with the radiologist interpretation  Consultations Obtained:  I requested consultation with the orthopedic surgery Dr. Ginette Pitman,  and discussed lab and imaging findings as well as pertinent plan - they recommend: He consulted with the patient's primary hand surgeon Dr. Amedeo Plenty who recommends IV Ancef, thorough irrigation, and nonadherent dressing.  He will see the patient at 8:30 AM in his office tomorrow.  Afebrile,  hemodynamically stable.  Patient is a 81 year old male who presents ED for evaluation of laceration to the left thumb.  Neurovascular status intact.  X-ray shows longitudinal fracture underlying the laceration.  Hand surgery was consulted.  IV Ancef was given. patient's wound was thoroughly irrigated and cleansed and loose closure was attempted with Steri-Strips.  Nonadherent dressing and tube gauze was applied.  Patient will follow up tomorrow with hand surgery.  Pain medication was offered to patient, however he declined and stated that he does not do well with pain medication.  Stable at discharge.  At this time there does not appear to be any evidence of an acute emergency medical condition and the patient appears stable for discharge with appropriate outpatient follow up. Diagnosis was discussed with patient who verbalizes understanding of care plan and is agreeable to discharge. I have discussed return precautions with patient who verbalizes understanding. Patient encouraged to follow-up with their PCP within 1 week. All questions answered.  Patient's case discussed with Dr. Laverta Baltimore who agrees with plan to discharge with follow-up.   Note: Portions of this report may have been transcribed using voice recognition software. Every effort was made to ensure accuracy; however, inadvertent computerized transcription errors may still be present.          Final Clinical Impression(s) / ED Diagnoses Final diagnoses:  None    Rx / DC Orders ED Discharge Orders     None         Nehemiah Massed 05/27/22 Brock Ra, MD 05/28/22 1122

## 2022-05-28 ENCOUNTER — Ambulatory Visit (HOSPITAL_COMMUNITY)
Admission: RE | Admit: 2022-05-28 | Discharge: 2022-05-28 | Disposition: A | Discharge status: Home / Self Care | Payer: Medicare Other | Attending: Orthopedic Surgery | Admitting: Orthopedic Surgery

## 2022-05-28 ENCOUNTER — Encounter (HOSPITAL_COMMUNITY): Payer: Self-pay | Admitting: Orthopedic Surgery

## 2022-05-28 ENCOUNTER — Ambulatory Visit (HOSPITAL_BASED_OUTPATIENT_CLINIC_OR_DEPARTMENT_OTHER): Payer: Medicare Other | Admitting: Certified Registered Nurse Anesthetist

## 2022-05-28 ENCOUNTER — Other Ambulatory Visit: Payer: Self-pay

## 2022-05-28 ENCOUNTER — Ambulatory Visit (HOSPITAL_COMMUNITY): Payer: Medicare Other | Admitting: Certified Registered Nurse Anesthetist

## 2022-05-28 ENCOUNTER — Encounter (HOSPITAL_COMMUNITY)
Admission: RE | Disposition: A | Discharge status: Home / Self Care | Payer: Self-pay | Source: Home / Self Care | Attending: Orthopedic Surgery

## 2022-05-28 DIAGNOSIS — K449 Diaphragmatic hernia without obstruction or gangrene: Secondary | ICD-10-CM | POA: Diagnosis not present

## 2022-05-28 DIAGNOSIS — Z951 Presence of aortocoronary bypass graft: Secondary | ICD-10-CM | POA: Insufficient documentation

## 2022-05-28 DIAGNOSIS — S62522B Displaced fracture of distal phalanx of left thumb, initial encounter for open fracture: Secondary | ICD-10-CM | POA: Insufficient documentation

## 2022-05-28 DIAGNOSIS — S62521B Displaced fracture of distal phalanx of right thumb, initial encounter for open fracture: Secondary | ICD-10-CM | POA: Diagnosis not present

## 2022-05-28 DIAGNOSIS — W270XXA Contact with workbench tool, initial encounter: Secondary | ICD-10-CM | POA: Diagnosis not present

## 2022-05-28 DIAGNOSIS — I10 Essential (primary) hypertension: Secondary | ICD-10-CM

## 2022-05-28 DIAGNOSIS — Z8673 Personal history of transient ischemic attack (TIA), and cerebral infarction without residual deficits: Secondary | ICD-10-CM | POA: Insufficient documentation

## 2022-05-28 DIAGNOSIS — S6982XA Other specified injuries of left wrist, hand and finger(s), initial encounter: Secondary | ICD-10-CM

## 2022-05-28 DIAGNOSIS — S61111A Laceration without foreign body of right thumb with damage to nail, initial encounter: Secondary | ICD-10-CM | POA: Diagnosis not present

## 2022-05-28 DIAGNOSIS — I2510 Atherosclerotic heart disease of native coronary artery without angina pectoris: Secondary | ICD-10-CM

## 2022-05-28 DIAGNOSIS — K219 Gastro-esophageal reflux disease without esophagitis: Secondary | ICD-10-CM | POA: Diagnosis not present

## 2022-05-28 HISTORY — PX: INCISION AND DRAINAGE WOUND WITH NAILBED REPAIR: SHX5636

## 2022-05-28 LAB — GLUCOSE, CAPILLARY: Glucose-Capillary: 83 mg/dL (ref 70–99)

## 2022-05-28 LAB — POCT I-STAT, CHEM 8
BUN: 25 mg/dL — ABNORMAL HIGH (ref 8–23)
Calcium, Ion: 1.1 mmol/L — ABNORMAL LOW (ref 1.15–1.40)
Chloride: 104 mmol/L (ref 98–111)
Creatinine, Ser: 0.8 mg/dL (ref 0.61–1.24)
Glucose, Bld: 81 mg/dL (ref 70–99)
HCT: 43 % (ref 39.0–52.0)
Hemoglobin: 14.6 g/dL (ref 13.0–17.0)
Potassium: 3.9 mmol/L (ref 3.5–5.1)
Sodium: 139 mmol/L (ref 135–145)
TCO2: 25 mmol/L (ref 22–32)

## 2022-05-28 SURGERY — INCISION AND DRAINAGE WOUND WITH NAILBED REPAIR
Anesthesia: General | Site: Hand | Laterality: Left

## 2022-05-28 MED ORDER — ACETAMINOPHEN 10 MG/ML IV SOLN
INTRAVENOUS | Status: AC
  Filled 2022-05-28: qty 100

## 2022-05-28 MED ORDER — ORAL CARE MOUTH RINSE: 15.0000 mL | Freq: Once | OROMUCOSAL | Status: AC

## 2022-05-28 MED ORDER — PROPOFOL 10 MG/ML IV BOLUS
INTRAVENOUS | Status: DC | PRN
  Administered 2022-05-28: 150 mg via INTRAVENOUS

## 2022-05-28 MED ORDER — ACETAMINOPHEN 10 MG/ML IV SOLN: 1000.0000 mg | Freq: Once | INTRAVENOUS | Status: DC | PRN

## 2022-05-28 MED ORDER — PROPOFOL 10 MG/ML IV BOLUS
INTRAVENOUS | Status: AC
  Filled 2022-05-28: qty 20

## 2022-05-28 MED ORDER — OXYCODONE HCL 5 MG/5ML PO SOLN: 5.0000 mg | Freq: Once | ORAL | Status: DC | PRN

## 2022-05-28 MED ORDER — ACETAMINOPHEN 325 MG PO TABS: 325.0000 mg | ORAL_TABLET | ORAL | Status: DC | PRN

## 2022-05-28 MED ORDER — DEXAMETHASONE SODIUM PHOSPHATE 10 MG/ML IJ SOLN
INTRAMUSCULAR | Status: DC | PRN
  Administered 2022-05-28: 5 mg via INTRAVENOUS

## 2022-05-28 MED ORDER — LIDOCAINE 2% (20 MG/ML) 5 ML SYRINGE
INTRAMUSCULAR | Status: DC | PRN
  Administered 2022-05-28: 40 mg via INTRAVENOUS

## 2022-05-28 MED ORDER — ACETAMINOPHEN 160 MG/5ML PO SOLN: 325.0000 mg | ORAL | Status: DC | PRN

## 2022-05-28 MED ORDER — CHLORHEXIDINE GLUCONATE 0.12 % MT SOLN: 15.0000 mL | Freq: Once | OROMUCOSAL | Status: AC

## 2022-05-28 MED ORDER — FENTANYL CITRATE (PF) 250 MCG/5ML IJ SOLN
INTRAMUSCULAR | Status: AC
  Filled 2022-05-28: qty 5

## 2022-05-28 MED ORDER — LACTATED RINGERS IV SOLN: INTRAVENOUS | Status: DC

## 2022-05-28 MED ORDER — LIDOCAINE 2% (20 MG/ML) 5 ML SYRINGE
INTRAMUSCULAR | Status: AC
  Filled 2022-05-28: qty 5

## 2022-05-28 MED ORDER — AMISULPRIDE (ANTIEMETIC) 5 MG/2ML IV SOLN: 10.0000 mg | Freq: Once | INTRAVENOUS | Status: DC | PRN

## 2022-05-28 MED ORDER — FENTANYL CITRATE (PF) 100 MCG/2ML IJ SOLN: 25.0000 ug | INTRAMUSCULAR | Status: DC | PRN

## 2022-05-28 MED ORDER — EPHEDRINE SULFATE-NACL 50-0.9 MG/10ML-% IV SOSY
PREFILLED_SYRINGE | INTRAVENOUS | Status: DC | PRN
  Administered 2022-05-28: 5 mg via INTRAVENOUS

## 2022-05-28 MED ORDER — CHLORHEXIDINE GLUCONATE 0.12 % MT SOLN
OROMUCOSAL | Status: AC
  Administered 2022-05-28: 15 mL via OROMUCOSAL
  Filled 2022-05-28: qty 15

## 2022-05-28 MED ORDER — PHENYLEPHRINE 80 MCG/ML (10ML) SYRINGE FOR IV PUSH (FOR BLOOD PRESSURE SUPPORT)
PREFILLED_SYRINGE | INTRAVENOUS | Status: AC
  Filled 2022-05-28: qty 10

## 2022-05-28 MED ORDER — BUPIVACAINE HCL (PF) 0.25 % IJ SOLN
INTRAMUSCULAR | Status: AC
  Filled 2022-05-28: qty 30

## 2022-05-28 MED ORDER — PROMETHAZINE HCL 25 MG/ML IJ SOLN: 6.2500 mg | INTRAMUSCULAR | Status: DC | PRN

## 2022-05-28 MED ORDER — FENTANYL CITRATE (PF) 250 MCG/5ML IJ SOLN
INTRAMUSCULAR | Status: DC | PRN
  Administered 2022-05-28: 50 ug via INTRAVENOUS
  Administered 2022-05-28 (×2): 25 ug via INTRAVENOUS
  Administered 2022-05-28: 50 ug via INTRAVENOUS

## 2022-05-28 MED ORDER — BUPIVACAINE HCL (PF) 0.25 % IJ SOLN
INTRAMUSCULAR | Status: DC | PRN
  Administered 2022-05-28: 10 mL

## 2022-05-28 MED ORDER — OXYCODONE HCL 5 MG PO TABS: 5.0000 mg | ORAL_TABLET | Freq: Once | ORAL | Status: DC | PRN

## 2022-05-28 MED ORDER — SODIUM CHLORIDE 0.9 % IR SOLN
Status: DC | PRN
  Administered 2022-05-28: 1000 mL
  Administered 2022-05-28: 3000 mL

## 2022-05-28 MED ORDER — ACETAMINOPHEN 10 MG/ML IV SOLN
INTRAVENOUS | Status: DC | PRN
  Administered 2022-05-28: 1000 mg via INTRAVENOUS

## 2022-05-28 MED ORDER — CEFAZOLIN SODIUM-DEXTROSE 2-3 GM-%(50ML) IV SOLR
INTRAVENOUS | Status: DC | PRN
  Administered 2022-05-28: 2 g via INTRAVENOUS

## 2022-05-28 MED ORDER — ONDANSETRON HCL 4 MG/2ML IJ SOLN
INTRAMUSCULAR | Status: DC | PRN
  Administered 2022-05-28: 4 mg via INTRAVENOUS

## 2022-05-28 MED ORDER — CEFAZOLIN SODIUM 1 G IJ SOLR
INTRAMUSCULAR | Status: AC
  Filled 2022-05-28: qty 20

## 2022-05-28 SURGICAL SUPPLY — 47 items
BAG COUNTER SPONGE SURGICOUNT (BAG) ×1 IMPLANT
BNDG CONFORM 2 STRL LF (GAUZE/BANDAGES/DRESSINGS) IMPLANT
BNDG ELASTIC 2 VLCR STRL LF (GAUZE/BANDAGES/DRESSINGS) IMPLANT
BNDG ELASTIC 3X5.8 VLCR STR LF (GAUZE/BANDAGES/DRESSINGS) IMPLANT
BNDG ELASTIC 4X5.8 VLCR STR LF (GAUZE/BANDAGES/DRESSINGS) ×1 IMPLANT
BNDG GAUZE DERMACEA FLUFF 4 (GAUZE/BANDAGES/DRESSINGS) ×3 IMPLANT
CORD BIPOLAR FORCEPS 12FT (ELECTRODE) ×1 IMPLANT
COVER SURGICAL LIGHT HANDLE (MISCELLANEOUS) ×1 IMPLANT
CUFF TOURN SGL QUICK 18X4 (TOURNIQUET CUFF) ×1 IMPLANT
CUFF TOURN SGL QUICK 24 (TOURNIQUET CUFF)
CUFF TRNQT CYL 24X4X16.5-23 (TOURNIQUET CUFF) IMPLANT
DRAPE OEC MINIVIEW 54X84 (DRAPES) IMPLANT
DRSG ADAPTIC 3X8 NADH LF (GAUZE/BANDAGES/DRESSINGS) ×1 IMPLANT
DRSG EMULSION OIL 3X3 NADH (GAUZE/BANDAGES/DRESSINGS) IMPLANT
DRSG XEROFORM 1X8 (GAUZE/BANDAGES/DRESSINGS) IMPLANT
GAUZE SPONGE 4X4 12PLY STRL (GAUZE/BANDAGES/DRESSINGS) ×1 IMPLANT
GAUZE XEROFORM 1X8 LF (GAUZE/BANDAGES/DRESSINGS) ×1 IMPLANT
GLOVE BIOGEL M 8.0 STRL (GLOVE) ×1 IMPLANT
GLOVE SS BIOGEL STRL SZ 8 (GLOVE) ×1 IMPLANT
GOWN STRL REUS W/ TWL LRG LVL3 (GOWN DISPOSABLE) ×1 IMPLANT
GOWN STRL REUS W/ TWL XL LVL3 (GOWN DISPOSABLE) ×2 IMPLANT
GOWN STRL REUS W/TWL LRG LVL3 (GOWN DISPOSABLE) ×1
GOWN STRL REUS W/TWL XL LVL3 (GOWN DISPOSABLE) ×2
K-WIRE 0.9X102 (Wire) ×2 IMPLANT
KIT BASIN OR (CUSTOM PROCEDURE TRAY) ×1 IMPLANT
KIT TURNOVER KIT B (KITS) ×1 IMPLANT
KWIRE 0.9X102 (Wire) IMPLANT
MANIFOLD NEPTUNE II (INSTRUMENTS) ×1 IMPLANT
NDL HYPO 25GX1X1/2 BEV (NEEDLE) IMPLANT
NEEDLE HYPO 25GX1X1/2 BEV (NEEDLE) ×1 IMPLANT
NS IRRIG 1000ML POUR BTL (IV SOLUTION) ×1 IMPLANT
PACK ORTHO EXTREMITY (CUSTOM PROCEDURE TRAY) ×1 IMPLANT
PAD ARMBOARD 7.5X6 YLW CONV (MISCELLANEOUS) ×1 IMPLANT
PAD CAST 4YDX4 CTTN HI CHSV (CAST SUPPLIES) ×1 IMPLANT
PADDING CAST COTTON 4X4 STRL (CAST SUPPLIES) ×1
PILLOW ARM CARTER ADULT (MISCELLANEOUS) IMPLANT
SET CYSTO W/LG BORE CLAMP LF (SET/KITS/TRAYS/PACK) ×1 IMPLANT
SOL PREP POV-IOD 4OZ 10% (MISCELLANEOUS) ×2 IMPLANT
SPLINT FINGER 5/8X7.25 (SOFTGOODS) IMPLANT
SPLINT FINGER 7.25 ALUM (SOFTGOODS) ×1 IMPLANT
SPONGE T-LAP 4X18 ~~LOC~~+RFID (SPONGE) ×1 IMPLANT
SUT CHROMIC 4 0 RB 1X27 (SUTURE) IMPLANT
SUT CHROMIC 6 0 PS 4 (SUTURE) IMPLANT
TOWEL GREEN STERILE (TOWEL DISPOSABLE) ×1 IMPLANT
TOWEL GREEN STERILE FF (TOWEL DISPOSABLE) ×1 IMPLANT
TUBE CONNECTING 12X1/4 (SUCTIONS) ×1 IMPLANT
WATER STERILE IRR 1000ML POUR (IV SOLUTION) ×1 IMPLANT

## 2022-05-28 NOTE — H&P (Signed)
Jordan [name] is an 81 y.o. male.   Chief Complaint: Left thumb laceration with open bony fracture and disarray of the soft tissue structures HPI: Patient presents for left thumb reconstruction after a saw injury.  The patient had a mechanical/electrical saw penetrated his thumb with open fracture nailbed disarray and soft tissue abnormality.  Patient presents for evaluation and treatment of the of their upper extremity predicament. The patient denies neck, back, chest or  abdominal pain. The patient notes that they have no lower extremity problems. The patients primary complaint is noted. We are planning surgical care pathway for the upper extremity.   Past Medical History:  Diagnosis Date   Carotid stenosis, asymptomatic 07/27/2011   Cryptogenic stroke ([place]) 04/2019   Minimal carotid disease, normal TTE/TEE; LINQ ILR Placed -> no documented A-fib   DDD (degenerative disc disease)    ED (erectile dysfunction)    Essential hypertension 01/29/2017   GERD (gastroesophageal reflux disease)    History of hiatal hernia    History of kidney stones    passed stones - no surgery required   Hyperlipidemia with target low density lipoprotein (LDL) cholesterol less than 70 mg/dL 03/31/2014   Impaired fasting glucose    Knee pain    bilateral   Left main coronary artery disease 03/18/2017   Cardiac Cath: 55% dLM, tandem p-mLAD 75% --> referred for CABG.   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 Cyndia Bent @ [facility]):  LIMA-LAD, SVG-D2, SVG-OM, SVG-PDA (endoscopic SVG harvesting from right leg).   Shoulder pain    Wears hearing aid     Past Surgical History:  Procedure Laterality Date   APPENDECTOMY     2020   BUBBLE STUDY  08/05/2019   Procedure: BUBBLE STUDY;  Surgeon: Buford Dresser, MD;   Location[facility] ENDOSCOPY;  Service: Cardiovascular;;   Cardiac Stress Test  2008   Normal   CARPAL TUNNEL RELEASE Left [place] Right 2008   CHOLECYSTECTOMY, LAPAROSCOPIC  2021   in [place]  06/2013   Henrene Pastor - polyps   CORONARY ARTERY BYPASS GRAFT N/A 04/04/2017   Procedure: CORONARY ARTERY BYPASS GRAFTING x 4  LIMA-LAD, SVG-D2, SVG-OM, SVG-PDA (endoscopic SVG harvesting from right leg);  Surgeon: Gaye Pollack, MD;  Location: [place];  Service: Open Heart Surgery;  Laterality: N/A;   EXTRACORPOREAL SHOCK WAVE LITHOTRIPSY Right 10/23/2018   Procedure: EXTRACORPOREAL SHOCK WAVE LITHOTRIPSY (ESWL);  Surgeon: Bjorn Loser, MD;  Location: [facility] ORS;  Service: Urology;  Laterality: Right;   HERNIA REPAIR Left    inguinal   HIATAL HERNIA REPAIR     no surgery per pt   KNEE CARTILAGE SURGERY Bilateral    LEFT HEART CATH AND CORONARY ANGIOGRAPHY N/A 03/18/2017   Procedure: Left Heart Cath and Coronary Angiography;  Surgeon: Leonie Man, MD;  Location: [place] CV LAB;  Service: Cardiovascular:  55% dLM, p-mLAD 75% - mLAD 75%.  EF 55-65%. Mod non-obstructive RCA disease --> referred for CABG   LOOP RECORDER INSERTION N/A 08/05/2019   Procedure: LOOP RECORDER INSERTION;  Surgeon: Constance Haw, MD;  Location: [place] CV LAB;  Service: Cardiovascular;  Laterality: N/A;   MEDIAL PARTIAL KNEE REPLACEMENT Right 2010   Partial  right knee replacement   NM MYOVIEW LTD  01/2017   INTERMEDIATE RISK: Exercise tolerance was good at 10 minutes, however, fatigue and dyspnea were reporte-d -> hypertensive response to exercise.  6m Horizontal ST depressions noted during stress in the II, III, aVF, V6, V5 and V4 leads c/w ischemia.   Small siize, moderate severity perfusion defect  mid to distal anterior wall perfusion defect suggestive of ischemia..Marland Kitchen   ROTATOR CUFF REPAIR Left 2008   SHOULDER ARTHROSCOPY W/ ROTATOR CUFF REPAIR Right 06/15/2016   TEE WITHOUT  CARDIOVERSION N/A 04/04/2017   Procedure: TRANSESOPHAGEAL ECHOCARDIOGRAM (TEE);  Surgeon: BGaye Pollack MD;  [facility]  Service: Open Heart Surgery;  Laterality: N/A;   TEE WITHOUT CARDIOVERSION N/A 08/05/2019   Procedure: TRANSESOPHAGEAL ECHOCARDIOGRAM (TEE);  Surgeon: CBuford Dresser MD;  [facility] EF 60 to 65%, no RWMA.  Normal atria, normal RV.  Normal valves.  No vegetation noted.  No LAA thrombus.  Negative bubble study.  Mild-diffuse aortic atherosclerosis   TONSILLECTOMY     TRANSTHORACIC ECHOCARDIOGRAM  08/20/2021   a) 05/21/2019: EF 60 to 65%.  No LVH.  Indeterminate LV filling.  No RWMA.  Normal RV.  Normal atria.  Normal aortic and mitral valves other than mild aortic sclerosis.  Normal RVSP and RAP.;; b) 08/20/2021:    Family History  Problem Relation Age of Onset   Heart failure Mother    Cancer Mother        breast   Coronary artery disease Father        CABG   Heart attack Father        Long-term smoker.   Hyperlipidemia Father    Hyperlipidemia Brother    Hypertension Brother    Hyperlipidemia Son    Healthy Daughter    Colon cancer Neg Hx    Colon polyps Neg Hx    Rectal cancer Neg Hx    Stomach cancer Neg Hx    Social History:  reports that he has never smoked. He has never used smokeless tobacco. He reports current alcohol use. He reports that he does not use drugs.  Allergies:  Allergies  Allergen Reactions   Aspirin     Other reaction(s): excessive bleeding   Nexium [Esomeprazole]     Unknown reaction   Rosuvastatin Other (See Comments)   Crestor [Rosuvastatin Calcium] Other (See Comments)    Muscle aches     Medications Prior to Admission  Medication Sig Dispense Refill   acetaminophen (TYLENOL) 500 MG tablet Take 500 mg by mouth as needed.     Ascorbic Acid (VITAMIN C) 1000 MG tablet Take 1,000 mg by mouth daily.     aspirin EC 81 MG tablet Take 81 mg by mouth daily.     B Complex Vitamins (B COMPLEX 1 PO) Take 1  capsule by mouth daily as needed (for B complex vitamin levels).     Cholecalciferol (VITAMIN D3) 25 MCG (1000 UT) CAPS Take 1 capsule by mouth daily.     EPINEPHrine 0.3 mg/0.3 mL IJ SOAJ injection Inject 0.3 mg into the muscle as needed for anaphylaxis.     ezetimibe (ZETIA) 10 MG tablet Take 10 mg by mouth daily.      Nutritional Supplements (KETO PO) Take 1 capsule by mouth daily. Medication is called Keto DHEA 7     pantoprazole (PROTONIX) 40 MG tablet Take 40 mg by mouth daily.     pravastatin (PRAVACHOL) 20 MG tablet Take 20 mg by  mouth every evening.      tadalafil (ADCIRCA/CIALIS) 20 MG tablet Take 20 mg by mouth daily as needed for erectile dysfunction.     testosterone (ANDROGEL) 50 MG/5GM (1%) GEL Place 5 g onto the skin daily.     Testosterone 25 MG/2.5GM (1%) GEL 1 mL by Other route See admin instructions. APPLY 4 CLICKS (1 ML) TO ALTERNATING SHOULDERS DAILY     valsartan (DIOVAN) 80 MG tablet Take 1 tablet by mouth daily.     Zinc 30 MG TABS Take 1 tablet by mouth daily.      Results for orders placed or performed during the hospital encounter of 05/28/22 (from the past 48 hour(s))  I-STAT, chem 8     Status: Abnormal   Collection Time: 05/28/22  5:30 PM  Result Value Ref Range   Sodium 139 135 - 145 mmol/L   Potassium 3.9 3.5 - 5.1 mmol/L   Chloride 104 98 - 111 mmol/L   BUN 25 (H) 8 - 23 mg/dL   Creatinine, Ser 0.80 0.61 - 1.24 mg/dL   Glucose, Bld 81 70 - 99 mg/dL    Comment: Glucose reference range applies only to samples taken after fasting for at least 8 hours.   Calcium, Ion 1.10 (L) 1.15 - 1.40 mmol/L   TCO2 25 22 - 32 mmol/L   Hemoglobin 14.6 13.0 - 17.0 g/dL   HCT 43.0 39.0 - 52.0 %   DG Hand Complete Left  Result Date: 05/27/2022 CLINICAL DATA:  Severe laceration to thumb with tree limb cutter today. Pain and bleeding. EXAM: LEFT HAND - COMPLETE 3+ VIEW COMPARISON:  None Available. FINDINGS: A longitudinally oriented fracture of the distal phalanx of the  thumb is seen with intra-articular extension. No other fractures identified. No evidence of dislocation. IMPRESSION: Longitudinally oriented fracture of the distal phalanx of the thumb, with intra-articular extension. Electronically Signed   By: Marlaine Hind M.D.   On: 05/27/2022 16:32    Review of Systems  Respiratory: Negative.    Cardiovascular: Negative.     Blood pressure 125/72, pulse (!) 56, temperature 98.1 F (36.7 C), temperature source Oral, resp. rate 16, SpO2 95 %. Physical Exam open fracture left thumb with nailbed nail plate and soft tissue disarray.  We will plan for reconstruction.  The patient is alert and oriented in no acute distress. The patient complains of pain in the affected upper extremity.  The patient is noted to have a normal HEENT exam. Lung fields show equal chest expansion and no shortness of breath. Abdomen exam is nontender without distention. Lower extremity examination does not show any fracture dislocation or blood clot symptoms. Pelvis is stable and the neck and back are stable and nontender.   Assessment/Plan We will plan for irrigation and repair of tendon bone soft tissue nailbed nail plate removal and repair reconstruction is necessary.  We are planning surgery for your upper extremity. The risk and benefits of surgery to include risk of bleeding, infection, anesthesia,  damage to normal structures and failure of the surgery to accomplish its intended goals of relieving symptoms and restoring function have been discussed in detail. With this in mind we plan to proceed. I have specifically discussed with the patient the pre-and postoperative regime and the dos and don'ts and risk and benefits in great detail. Risk and benefits of surgery also include risk of dystrophy(CRPS), chronic nerve pain, failure of the healing process to go onto completion and other inherent risks of surgery The relavent  the pathophysiology of the disease/injury process, as well  as the alternatives for treatment and postoperative course of action has been discussed in great detail with the patient who desires to proceed.  We will do everything in our power to help you (the patient) restore function to the upper extremity. It is a pleasure to see this patient today.   Willa Frater III, MD 05/28/2022, 7:21 PM

## 2022-05-28 NOTE — Anesthesia Procedure Notes (Signed)
Procedure Name: LMA Insertion Date/Time: 05/28/2022 7:44 PM  Performed by: Rande Brunt, CRNAPre-anesthesia Checklist: Patient identified, Emergency Drugs available, Suction available and Patient being monitored Patient Re-evaluated:Patient Re-evaluated prior to induction Oxygen Delivery Method: Circle System Utilized Preoxygenation: Pre-oxygenation with 100% oxygen Induction Type: IV induction Ventilation: Mask ventilation without difficulty LMA: LMA inserted LMA Size: 4.0 Number of attempts: 1 Airway Equipment and Method: Bite block Placement Confirmation: positive ETCO2 and CO2 detector Tube secured with: Tape Dental Injury: Teeth and Oropharynx as per pre-operative assessment

## 2022-05-28 NOTE — Op Note (Signed)
Operative note: May 28, 2022  Roseanne Kaufman, MD.  Preoperative diagnosis left thumb open nailbed and nail plate injury with open distal phalanx fracture about the distal phalanx which extends into the interphalangeal joint.  Postop diagnosis the same.  Procedure: #1 irrigation debridement open fracture left thumb distal phalanx and interphalangeal joint.  This was an excisional debridement with curette scissor and knife blade #2 nail plate removal left thumb #3 nailbed repair left thumb #4 open reduction internal fixation with Kirschner wire fixation left thumb distal phalanx and interphalangeal joint region #5 5 view stress radiography  Roseanne Kaufman  Anesthesia General  Tourniquet time less than an hour  Indications for the procedure this is a 81 year old male with significant injury to his thumb.  This was a Market researcher type device.  He has an open fracture with nailbed and nail plate disarray.  He understands risk and benefits surgery and desires to proceed.  Operative procedure: Patient was seen by myself and anesthesia taken to the operative suite and underwent a smooth induction of general anesthetic.  He was prepped with 2 Hibiclens scrubs followed by 2 sets Betadine scrub and paint followed by Betadine painting and oscillation of sterile field.  Timeout was observed.  Once this was complete we insufflated the tourniquet and performed irrigation and debridement.  I removed his nail plate.  This was nail plate removal left thumb.  Following this I then performed irrigation of skin subtenons tissue interphalangeal joint and distal phalanx with curette and orthopedic instruments.  Curette knife and scissor were used for the excisional debridement.  4 L of saline was placed through and through the area.  Following this I then repaired the open fracture and IP region with 3 Kirschner wires.  3 Kirschner wires were placed that difficulty.  He tolerated this well.  Following the  irrigation debridement and ORIF I then meticulously repaired the germinal and sterile matrix with 6-0 chromic.  Tourniquet was deflated.  Refill was stable.  The skin edge was closed with chromic suture.  Patient had adequate refill no complications.  He had a large amount of soft tissue damage but I am cautiously optimistic.  We will plan for doxycycline for 10 days 100 mg twice daily.  Pain management has been discussed and implemented.  He was dressed with Adaptic Xeroform gauze Curlex and a thumb spica splint.  This was a noncompressive splint to make sure there were no excessive forces against the pins or against the patient's skin.  We will see him back in the office in 10 to 14 days.  All questions have been addressed.  Date of dictation and surgery 05/28/2022.  Roseanne Kaufman, MD

## 2022-05-28 NOTE — Discharge Instructions (Signed)
Please call Dr. Amedeo Plenty for any problems.  His cell phone number [phone]  Keep bandage clean and dry.  Call for any problems.  No smoking.  Criteria for driving a car: you should be off your pain medicine for 7-8 hours, able to drive one handed(confident), thinking clearly and feeling able in your judgement to drive. Continue elevation as it will decrease swelling.  If instructed by MD move your fingers within the confines of the bandage/splint.  Use ice if instructed by your MD. Call immediately for any sudden loss of feeling in your hand/arm or change in functional abilities of the extremity.We recommend that you to take vitamin C 1000 mg a day to promote healing. We also recommend that if you require  pain medicine that you take a stool softener to prevent constipation as most pain medicines will have constipation side effects. We recommend either Peri-Colace or Senokot and recommend that you also consider adding MiraLAX as well to prevent the constipation affects from pain medicine if you are required to use them. These medicines are over the counter and may be purchased at a local pharmacy. A cup of yogurt and a probiotic can also be helpful during the recovery process as the medicines can disrupt your intestinal environment.

## 2022-05-28 NOTE — Anesthesia Preprocedure Evaluation (Signed)
Anesthesia Evaluation  Patient identified by MRN, date of birth, ID band Patient awake    Reviewed: Allergy & Precautions, NPO status , Patient's Chart, lab work & pertinent test results  Airway Mallampati: I  TM Distance: >3 FB Neck ROM: Full    Dental  (+) Teeth Intact, Dental Advisory Given   Pulmonary neg pulmonary ROS,    breath sounds clear to auscultation       Cardiovascular hypertension, + CAD and + CABG   Rhythm:Regular Rate:Normal     Neuro/Psych  Headaches, CVA negative psych ROS   GI/Hepatic Neg liver ROS, hiatal hernia, GERD  Medicated,  Endo/Other  negative endocrine ROS  Renal/GU negative Renal ROS     Musculoskeletal  (+) Arthritis ,   Abdominal Normal abdominal exam  (+)   Peds  Hematology negative hematology ROS (+)   Anesthesia Other Findings   Reproductive/Obstetrics                             Anesthesia Physical Anesthesia Plan  ASA: 3  Anesthesia Plan: General   Post-op Pain Management:    Induction: Intravenous  PONV Risk Score and Plan: 3 and Ondansetron, Dexamethasone and Midazolam  Airway Management Planned: LMA  Additional Equipment: None  Intra-op Plan:   Post-operative Plan: Extubation in OR  Informed Consent: I have reviewed the patients History and Physical, chart, labs and discussed the procedure including the risks, benefits and alternatives for the proposed anesthesia with the patient or authorized representative who has indicated his/her understanding and acceptance.     Dental advisory given  Plan Discussed with: CRNA  Anesthesia Plan Comments:         Anesthesia Quick Evaluation

## 2022-05-28 NOTE — Transfer of Care (Signed)
Immediate Anesthesia Transfer of Care Note  Patient: Machael Raine Preast  Procedure(s) Performed: Irrigation and debridement and open repair bone nail plate nailbed and soft tissue structures (Left: Hand)  Patient Location: PACU  Anesthesia Type:General  Level of Consciousness: awake, alert , and oriented  Airway & Oxygen Therapy: Patient Spontanous Breathing  Post-op Assessment: Report given to RN, Post -op Vital signs reviewed and stable, and Patient moving all extremities X 4  Post vital signs: Reviewed and stable  Last Vitals:  Vitals Value Taken Time  BP    Temp    Pulse 70 05/28/22 2100  Resp 9 05/28/22 2100  SpO2 93 % 05/28/22 2100  Vitals shown include unvalidated device data.  Last Pain:  Vitals:   05/28/22 1707  TempSrc: Oral  PainSc: 3       Patients Stated Pain Goal: 0 (30/07/62 2633)  Complications: No notable events documented.

## 2022-05-29 ENCOUNTER — Encounter (HOSPITAL_COMMUNITY): Payer: Self-pay | Admitting: Orthopedic Surgery

## 2022-05-29 NOTE — Anesthesia Postprocedure Evaluation (Signed)
Anesthesia Post Note  Patient: Dene Nazir Cournoyer  Procedure(s) Performed: Irrigation and debridement and open repair bone nail plate nailbed and soft tissue structures (Left: Hand)     Patient location during evaluation: PACU Anesthesia Type: General Level of consciousness: awake and alert Pain management: pain level controlled Vital Signs Assessment: post-procedure vital signs reviewed and stable Respiratory status: spontaneous breathing, nonlabored ventilation, respiratory function stable and patient connected to nasal cannula oxygen Cardiovascular status: blood pressure returned to baseline and stable Postop Assessment: no apparent nausea or vomiting Anesthetic complications: no   No notable events documented.  Last Vitals:  Vitals:   05/28/22 2100 05/28/22 2130  BP: (!) 171/75 (!) 169/81  Pulse: 70 (!) 56  Resp: (!) 9 11  Temp: 36.6 C 36.6 C  SpO2: 93% 97%    Last Pain:  Vitals:   05/28/22 2130  TempSrc:   PainSc: 0-No pain                 Effie Berkshire

## 2022-06-06 DIAGNOSIS — Z4789 Encounter for other orthopedic aftercare: Secondary | ICD-10-CM | POA: Diagnosis not present

## 2022-06-06 DIAGNOSIS — S62522B Displaced fracture of distal phalanx of left thumb, initial encounter for open fracture: Secondary | ICD-10-CM | POA: Diagnosis not present

## 2022-06-19 DIAGNOSIS — Z23 Encounter for immunization: Secondary | ICD-10-CM | POA: Diagnosis not present

## 2022-06-21 DIAGNOSIS — M79645 Pain in left finger(s): Secondary | ICD-10-CM | POA: Diagnosis not present

## 2022-06-21 DIAGNOSIS — Z4789 Encounter for other orthopedic aftercare: Secondary | ICD-10-CM | POA: Diagnosis not present

## 2022-06-21 DIAGNOSIS — M25642 Stiffness of left hand, not elsewhere classified: Secondary | ICD-10-CM | POA: Diagnosis not present

## 2022-06-21 DIAGNOSIS — S62522D Displaced fracture of distal phalanx of left thumb, subsequent encounter for fracture with routine healing: Secondary | ICD-10-CM | POA: Diagnosis not present

## 2022-06-25 ENCOUNTER — Ambulatory Visit (INDEPENDENT_AMBULATORY_CARE_PROVIDER_SITE_OTHER): Payer: Medicare Other

## 2022-06-25 DIAGNOSIS — I639 Cerebral infarction, unspecified: Secondary | ICD-10-CM

## 2022-06-27 LAB — CUP PACEART REMOTE DEVICE CHECK
Date Time Interrogation Session: 20231103230715
Implantable Pulse Generator Implant Date: 20201216

## 2022-06-29 ENCOUNTER — Telehealth: Payer: Self-pay

## 2022-06-29 NOTE — Telephone Encounter (Signed)
   Reason for call: ED-Follow up call   Patient visit on 05/27/2022 at [facility] was for Laceration  Have you been able to follow up with your primary care physician? - Yes  The patient was or was not able to obtain any needed medicine or equipment. - Was, Yes  Are there diet recommendations that you are having difficulty following? - No  Patient expresses understanding of discharge instructions and education provided has no other needs at this time.   [facility]  [place], [place]  Main Phone: [phone]  E-mail: [email or web address]  Website: [email or web address]

## 2022-07-05 DIAGNOSIS — Z4789 Encounter for other orthopedic aftercare: Secondary | ICD-10-CM | POA: Diagnosis not present

## 2022-07-05 DIAGNOSIS — M79645 Pain in left finger(s): Secondary | ICD-10-CM | POA: Diagnosis not present

## 2022-07-05 DIAGNOSIS — S62522D Displaced fracture of distal phalanx of left thumb, subsequent encounter for fracture with routine healing: Secondary | ICD-10-CM | POA: Diagnosis not present

## 2022-07-09 DIAGNOSIS — Z85828 Personal history of other malignant neoplasm of skin: Secondary | ICD-10-CM | POA: Diagnosis not present

## 2022-07-09 DIAGNOSIS — Z85820 Personal history of malignant melanoma of skin: Secondary | ICD-10-CM | POA: Diagnosis not present

## 2022-07-09 DIAGNOSIS — D1722 Benign lipomatous neoplasm of skin and subcutaneous tissue of left arm: Secondary | ICD-10-CM | POA: Diagnosis not present

## 2022-07-09 DIAGNOSIS — L821 Other seborrheic keratosis: Secondary | ICD-10-CM | POA: Diagnosis not present

## 2022-07-09 DIAGNOSIS — L570 Actinic keratosis: Secondary | ICD-10-CM | POA: Diagnosis not present

## 2022-07-09 DIAGNOSIS — D1801 Hemangioma of skin and subcutaneous tissue: Secondary | ICD-10-CM | POA: Diagnosis not present

## 2022-07-09 DIAGNOSIS — L812 Freckles: Secondary | ICD-10-CM | POA: Diagnosis not present

## 2022-07-30 ENCOUNTER — Ambulatory Visit (INDEPENDENT_AMBULATORY_CARE_PROVIDER_SITE_OTHER): Payer: Medicare Other

## 2022-07-30 DIAGNOSIS — I10 Essential (primary) hypertension: Secondary | ICD-10-CM | POA: Diagnosis not present

## 2022-07-30 DIAGNOSIS — E785 Hyperlipidemia, unspecified: Secondary | ICD-10-CM | POA: Diagnosis not present

## 2022-07-30 DIAGNOSIS — Z1212 Encounter for screening for malignant neoplasm of rectum: Secondary | ICD-10-CM | POA: Diagnosis not present

## 2022-07-30 DIAGNOSIS — R7301 Impaired fasting glucose: Secondary | ICD-10-CM | POA: Diagnosis not present

## 2022-07-30 DIAGNOSIS — E291 Testicular hypofunction: Secondary | ICD-10-CM | POA: Diagnosis not present

## 2022-07-30 DIAGNOSIS — I639 Cerebral infarction, unspecified: Secondary | ICD-10-CM

## 2022-07-30 DIAGNOSIS — M8589 Other specified disorders of bone density and structure, multiple sites: Secondary | ICD-10-CM | POA: Diagnosis not present

## 2022-07-30 DIAGNOSIS — R7989 Other specified abnormal findings of blood chemistry: Secondary | ICD-10-CM | POA: Diagnosis not present

## 2022-07-31 LAB — CUP PACEART REMOTE DEVICE CHECK
Date Time Interrogation Session: 20231210231601
Implantable Pulse Generator Implant Date: 20201216

## 2022-08-01 NOTE — Progress Notes (Signed)
[facility] Summary Report / Loop Recorder 

## 2022-08-06 DIAGNOSIS — R413 Other amnesia: Secondary | ICD-10-CM | POA: Diagnosis not present

## 2022-08-06 DIAGNOSIS — I10 Essential (primary) hypertension: Secondary | ICD-10-CM | POA: Diagnosis not present

## 2022-08-06 DIAGNOSIS — R9089 Other abnormal findings on diagnostic imaging of central nervous system: Secondary | ICD-10-CM | POA: Diagnosis not present

## 2022-08-06 DIAGNOSIS — Z0000 Encounter for general adult medical examination without abnormal findings: Secondary | ICD-10-CM | POA: Diagnosis not present

## 2022-08-06 DIAGNOSIS — C439 Malignant melanoma of skin, unspecified: Secondary | ICD-10-CM | POA: Diagnosis not present

## 2022-08-06 DIAGNOSIS — E785 Hyperlipidemia, unspecified: Secondary | ICD-10-CM | POA: Diagnosis not present

## 2022-08-06 DIAGNOSIS — Z1339 Encounter for screening examination for other mental health and behavioral disorders: Secondary | ICD-10-CM | POA: Diagnosis not present

## 2022-08-06 DIAGNOSIS — D6862 Lupus anticoagulant syndrome: Secondary | ICD-10-CM | POA: Diagnosis not present

## 2022-08-06 DIAGNOSIS — M1990 Unspecified osteoarthritis, unspecified site: Secondary | ICD-10-CM | POA: Diagnosis not present

## 2022-08-06 DIAGNOSIS — I6340 Cerebral infarction due to embolism of unspecified cerebral artery: Secondary | ICD-10-CM | POA: Diagnosis not present

## 2022-08-06 DIAGNOSIS — Z1331 Encounter for screening for depression: Secondary | ICD-10-CM | POA: Diagnosis not present

## 2022-08-06 DIAGNOSIS — I2510 Atherosclerotic heart disease of native coronary artery without angina pectoris: Secondary | ICD-10-CM | POA: Diagnosis not present

## 2022-08-06 DIAGNOSIS — H8109 Meniere's disease, unspecified ear: Secondary | ICD-10-CM | POA: Diagnosis not present

## 2022-08-06 DIAGNOSIS — Q791 Other congenital malformations of diaphragm: Secondary | ICD-10-CM | POA: Diagnosis not present

## 2022-08-08 DIAGNOSIS — I10 Essential (primary) hypertension: Secondary | ICD-10-CM | POA: Diagnosis not present

## 2022-08-08 DIAGNOSIS — Z6826 Body mass index (BMI) 26.0-26.9, adult: Secondary | ICD-10-CM | POA: Diagnosis not present

## 2022-08-08 DIAGNOSIS — H6123 Impacted cerumen, bilateral: Secondary | ICD-10-CM | POA: Diagnosis not present

## 2022-08-22 DIAGNOSIS — H0102A Squamous blepharitis right eye, upper and lower eyelids: Secondary | ICD-10-CM | POA: Diagnosis not present

## 2022-08-22 DIAGNOSIS — H2513 Age-related nuclear cataract, bilateral: Secondary | ICD-10-CM | POA: Diagnosis not present

## 2022-08-22 DIAGNOSIS — H43813 Vitreous degeneration, bilateral: Secondary | ICD-10-CM | POA: Diagnosis not present

## 2022-08-22 DIAGNOSIS — D3132 Benign neoplasm of left choroid: Secondary | ICD-10-CM | POA: Diagnosis not present

## 2022-08-22 DIAGNOSIS — H0102B Squamous blepharitis left eye, upper and lower eyelids: Secondary | ICD-10-CM | POA: Diagnosis not present

## 2022-08-22 DIAGNOSIS — H04123 Dry eye syndrome of bilateral lacrimal glands: Secondary | ICD-10-CM | POA: Diagnosis not present

## 2022-08-22 DIAGNOSIS — H1045 Other chronic allergic conjunctivitis: Secondary | ICD-10-CM | POA: Diagnosis not present

## 2022-08-27 ENCOUNTER — Encounter: Payer: Self-pay | Admitting: Cardiology

## 2022-08-27 ENCOUNTER — Ambulatory Visit: Payer: Medicare Other | Attending: Cardiology | Admitting: Cardiology

## 2022-08-27 VITALS — BP 122/64 | HR 58 | Ht 67.0 in | Wt 174.8 lb

## 2022-08-27 DIAGNOSIS — I2510 Atherosclerotic heart disease of native coronary artery without angina pectoris: Secondary | ICD-10-CM | POA: Diagnosis not present

## 2022-08-27 DIAGNOSIS — D6861 Antiphospholipid syndrome: Secondary | ICD-10-CM | POA: Insufficient documentation

## 2022-08-27 DIAGNOSIS — E785 Hyperlipidemia, unspecified: Secondary | ICD-10-CM | POA: Diagnosis not present

## 2022-08-27 DIAGNOSIS — I639 Cerebral infarction, unspecified: Secondary | ICD-10-CM | POA: Diagnosis not present

## 2022-08-27 DIAGNOSIS — I10 Essential (primary) hypertension: Secondary | ICD-10-CM | POA: Insufficient documentation

## 2022-08-27 DIAGNOSIS — Z951 Presence of aortocoronary bypass graft: Secondary | ICD-10-CM | POA: Insufficient documentation

## 2022-08-27 NOTE — Progress Notes (Signed)
Primary Care Provider: Crist [name], [place] Cardiologist: Jordan Hew, MD Electrophysiologist: Jordan Meredith Leeds, MD  Clinic Note: Chief Complaint  Patient presents with   18-monthfollow-up    Discussed results of stress test   Coronary Artery Disease    No active angina.  Doing well.  Stress test reviewed.   ===================================  ASSESSMENT/PLAN   Problem List Items Addressed This Visit       Cardiology Problems   Hyperlipidemia with target low density lipoprotein (LDL) cholesterol less than 70 mg/dL (Chronic)    He is on low-dose pravastatin as well as anemia.  Had memory issues with Crestor.  Most recent labs are borderline, but not yet at goal.  Would reassess in roughly 6 months.  If not at goal, would have a low threshold to increase pravastatin a slightly higher dose.  Could also consider Nexletol converting Zetia and Nexletol to Nexlizet.      Essential hypertension (Chronic)    Pretty well-controlled on low-moderate-dose Diovan      Left main coronary artery disease (Chronic)    Doing remarkably well following CABG.  Surveillance Myoview low risk, nonischemic with no infarction.  Normal EF.  That corresponds well with his lack of symptoms.  He has not really required that recently of any significant medications.  Plan: Continue aspirin 81 mg On low-dose losartan with adequate blood pressure control.  Not on beta-blocker because of bradycardia and fatigue. On low-dose pravastatin plus Zetia with borderline lipid control.  LDL was 77.  Would recheck roughly 6 months out and see where he stands, low threshold to increase to 40 mg pravastatin versus adding Nexletol.      Cryptogenic stroke (Jordan [name] - Primary (Chronic)    No recurrent symptoms.  Nothing yet seen on ILR.  Echo and TEE were normal.  Carotids normal.  Continue current CV risk factor modification        Other   S/P CABG x 4 (Chronic)    Doing remarkably well  since his CABG.  Nonischemic Myoview      Antiphospholipid antibody syndrome (HCC) (Chronic)    He declined DOAC because of concerns for bleeding or bruising and [place] not wanting to have significant bleeding being quite isolated.  Has not had any recurrent episodes.  Remains on aspirin.      ===================================  HPI:    [name] a very active and otherwise healthy 82y.o. male with a PMH notable for multivessel CAD-CABG (Cardiac Cath for abnormal Myoview Stress Test ordered for worsening exertional dyspnea and atypical chest pain June 2018-CABG x4 August 2018), HTN/HLD and History of Cryptogenic Stroke October 2020 reviewed below who presents today for 478-monthollow-up-reviewed test results.  CAD History: Myoview 02/12/2017: (Worsening exertional dyspnea/atypical chest pain) -> INTERMEDIATE RISK with anterior perfusion defect and grossly abnormal EKG Cardiac Cath 03/18/2017: 55% dLM, p-mLAD 75% - mLAD 75%.  EF 55-65%. Mod non-obstructive RCA disease --> OP CVTS c/s (Dr. BaCyndia Bentugust 8, 2018)  CABG x 4 04/04/2017 (Jordan [name] LIMA-LAD, SVG-D2, SVG-OM, SVG-PDA (endoscopic SVG harvesting from right leg). => d/c on POD #4. Went back to full activity - plays golf, walks & bikes routinely - over the summer enjoys kayaking 45 min to 1 hr.  HLD: Did not tolerate rosuvastatin, started on pravastatin. Cryptogenic stroke October 2020-focal punctate lesion seen on MRI Antiphospholipid antibody syndrome -=> declined DOAC.  Jordan [name] last seen on April 25, 2022-after 5  years being lost to follow-up.  He did see Jordan [name] at the time of his stroke for loop recorder placement.  He was doing relatively well from a cardiac standpoint.  Remains active doing squatting exercising and riding a bicycle or walking despite every day.  300 push-ups 3 times a week.  In the summer, usually enjoys kayaking anywhere from 45 minutes to 1-1/2  hours.  Recent Hospitalizations:  October 8-9/20/2023: ER visit for nondisplaced/open fracture of distal phalanx of left thumb => irrigation debridement of open wound with nail bone plate = Seen by Dr. Brigid [name]  Reviewed  CV studies:    The following studies were reviewed today: (if available, images/films reviewed: From Epic Chart or Care Everywhere) 05/07/2022-Myoview: Average exercise capacity (7:00 min:s; 8.5 METS). Normal HR response to exercise. Hypertensive response to exercise.  EF 55 to 65% (60%).  Normal size and function.  NO EVIDENCE OF ISCHEMIA OR INFARCTION -> NORMAL/LOW RISK  Interval History:   Jordan [name] returns here today for 59-monthfollow-up to discuss results of his test.  He is doing remarkably well.  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  He walks anywhere from 2-1/2 to [place]  He has been really more limited by knee pain than anything else lately.  He still does his routine calisthenic workouts including push-ups, sit ups and stretching.  He would not surprised to hear the results of his test, but was happy nonetheless.  Denies any active cardiac symptoms of PND orthopnea or edema.  No arrhythmia symptoms.  No recurrent syncope or near syncope.  No TIA or amaurosis fugax symptoms. No claudication symptoms.   He does notice that he may be has slowed down a little bit over the last couple years, but attributes it mostly to his knee pain.  REVIEWED OF SYSTEMS   Complete ROS was performed: Pertinent noncardiac symptoms: Otherwise review of symptoms negative His thumb is healing well after the surgery. No further falls. Still has arthritis pains mostly in his right greater than left knee. Easy bruising but no bleeding.  No melena, hematochezia, hematuria or epistaxis.  I have reviewed and (if needed) personally updated the patient's problem list, medications, allergies, past medical and surgical  history, social and family history.    PAST MEDICAL HISTORY   Past Medical History:  Diagnosis Date   Carotid stenosis, asymptomatic 07/27/2011   Cryptogenic stroke ([place] 04/2019   Minimal carotid disease, normal TTE/TEE; LINQ ILR Placed -> no documented A-fib   DDD (degenerative disc disease)    ED (erectile dysfunction)    Essential hypertension 01/29/2017   GERD (gastroesophageal reflux disease)    History of hiatal hernia    History of kidney stones    passed stones - no surgery required   Hyperlipidemia with target low density lipoprotein (LDL) cholesterol less than 70 mg/dL 03/31/2014   Impaired fasting glucose    Knee pain    bilateral   Left main coronary artery disease 03/18/2017   Cardiac Cath: 55% dLM, tandem p-mLAD 75% --> referred for CABG.   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:  LIMA-LAD, SVG-D2, SVG-OM, SVG-PDA (endoscopic SVG harvesting from  right leg).   Shoulder pain    Wears hearing aid     PAST SURGICAL HISTORY   Past Surgical History:  Procedure Laterality Date   APPENDECTOMY     2020   BUBBLE STUDY  08/05/2019   Procedure: BUBBLE STUDY;  Surgeon: Buford Dresser, MD;  Location: [facility] ENDOSCOPY;  Service: Cardiovascular;;   Cardiac Stress Test  2008   Normal   CARPAL TUNNEL RELEASE Left [place] Right 2008   CHOLECYSTECTOMY, LAPAROSCOPIC  2021   in [place]  06/2013   Henrene Pastor - polyps   CORONARY ARTERY BYPASS GRAFT N/A 04/04/2017   Procedure: CORONARY ARTERY BYPASS GRAFTING x 4  LIMA-LAD, SVG-D2, SVG-OM, SVG-PDA (endoscopic SVG harvesting from right leg);  Surgeon: Gaye Pollack, MD;  Location: [place];  Service: Open Heart Surgery;  Laterality: N/A;   EXTRACORPOREAL SHOCK WAVE LITHOTRIPSY Right 10/23/2018    Procedure: EXTRACORPOREAL SHOCK WAVE LITHOTRIPSY (ESWL);  Surgeon: Bjorn Loser, MD;  Location: [facility] ORS;  Service: Urology;  Laterality: Right;   HERNIA REPAIR Left    inguinal   HIATAL HERNIA REPAIR     no surgery per pt   INCISION AND DRAINAGE WOUND WITH NAILBED REPAIR Left 05/28/2022   Procedure: Irrigation and debridement and open repair bone nail plate nailbed and soft tissue structures;  Surgeon: Roseanne Kaufman, MD;  Location: [place];  Service: Orthopedics;  Laterality: Left;  1 hr Block with IV Sedation   KNEE CARTILAGE SURGERY Bilateral    LEFT HEART CATH AND CORONARY ANGIOGRAPHY N/A 03/18/2017   Procedure: Left Heart Cath and Coronary Angiography;  Surgeon: Leonie Man, MD;  Location: [place] CV LAB;  Service: Cardiovascular:  55% dLM, p-mLAD 75% - mLAD 75%.  EF 55-65%. Mod non-obstructive RCA disease --> referred for CABG   LOOP RECORDER INSERTION N/A 08/05/2019   Procedure: LOOP RECORDER INSERTION;  Surgeon: Constance Haw, MD;  Location: [place] CV LAB;  Service: Cardiovascular;  Laterality: N/A;   MEDIAL PARTIAL KNEE REPLACEMENT Right 2010   Partial right knee replacement   NM MYOVIEW LTD  01/2017   INTERMEDIATE RISK: Exercise tolerance was good at 10 minutes, however, fatigue and dyspnea were reporte-d -> hypertensive response to exercise.  33m Horizontal ST depressions noted during stress in the II, III, aVF, V6, V5 and V4 leads c/w ischemia.   Small siize, moderate severity perfusion defect  mid to distal anterior wall perfusion defect suggestive of ischemia..Marland Kitchen   NM MYOVIEW LTD  05/07/2022   Treadmill Myoview: Average exercise capacity (7:00 min:s; 8.5 METS). Normal HR response to exercise. Hypertensive response to exercise.  EF 55 to 65% (60%).  Normal size and function.  NO EVIDENCE OF ISCHEMIA OR INFARCTION -> NORMAL/LOW RISK   ROTATOR CUFF REPAIR Left 2008   SHOULDER ARTHROSCOPY W/ ROTATOR CUFF REPAIR Right 06/15/2016   TEE WITHOUT CARDIOVERSION N/A  04/04/2017   Procedure: TRANSESOPHAGEAL ECHOCARDIOGRAM (TEE);  Surgeon: BGaye Pollack MD;  [place] [facility]  Service: Open Heart Surgery;  Laterality: N/A;   TEE WITHOUT CARDIOVERSION N/A 08/05/2019   Procedure: TRANSESOPHAGEAL ECHOCARDIOGRAM (TEE);  Surgeon: CBuford Dresser MD;  [facility]:: EF 60 to 65%, no RWMA.  Normal atria, normal RV.  Normal valves.  No vegetation noted.  No LAA thrombus.  Negative bubble study.  Mild-diffuse aortic atherosclerosis   TONSILLECTOMY     TRANSTHORACIC ECHOCARDIOGRAM  08/20/2021   a) 05/21/2019: EF 60 to 65%.  No LVH.  Indeterminate  LV filling.  No RWMA.  Normal RV.  Normal atria.  Normal aortic and mitral valves other than mild aortic sclerosis.  Normal RVSP and RAP.;; b) 08/20/2021:    Immunization History  Administered Date(s) Administered   COVID-19, mRNA, vaccine(Comirnaty)12 years and older 06/19/2022   Influenza, High Dose Seasonal PF 05/20/2016, 05/20/2018   PFIZER Comirnaty(Gray Top)Covid-19 Tri-Sucrose Vaccine 12/15/2020   Zoster Recombinat (Shingrix) 05/17/2017, 09/05/2017    MEDICATIONS/ALLERGIES   Current Meds  Medication Sig   7-Keto DHEA POWD by Does not apply route.   Ascorbic Acid (VITAMIN C) 1000 MG tablet Take 1,000 mg by mouth daily.   aspirin EC 81 MG tablet Take 81 mg by mouth daily.   B Complex Vitamins (B COMPLEX 1 PO) Take 1 capsule by mouth daily as needed (for B complex vitamin levels).   Cholecalciferol (VITAMIN D3) 25 MCG (1000 UT) CAPS Take 1 capsule by mouth daily.   cyanocobalamin (VITAMIN B12) 500 MCG tablet Take 500 mcg by mouth daily.   EPINEPHrine 0.3 mg/0.3 mL IJ SOAJ injection Inject 0.3 mg into the muscle as needed for anaphylaxis.   ezetimibe (ZETIA) 10 MG tablet Take 10 mg by mouth daily.    Nutritional Supplements (KETO PO) Take 1 capsule by mouth daily. Medication is called Keto DHEA 7   pantoprazole (PROTONIX) 40 MG tablet Take 40 mg by mouth daily.   pravastatin (PRAVACHOL) 20 MG  tablet Take 20 mg by mouth every evening.    tadalafil (ADCIRCA/CIALIS) 20 MG tablet Take 20 mg by mouth daily as needed for erectile dysfunction.   Testosterone 25 MG/2.5GM (1%) GEL 1 mL by Other route See admin instructions. APPLY 4 CLICKS (1 ML) TO ALTERNATING SHOULDERS DAILY   valsartan (DIOVAN) 80 MG tablet Take 1 tablet by mouth daily.   Zinc 30 MG TABS Take 1 tablet by mouth daily.    Allergies  Allergen Reactions   Aspirin     Other reaction(s): excessive bleeding   Nexium [Esomeprazole]     Unknown reaction   Rosuvastatin Other (See Comments)   Crestor [Rosuvastatin Calcium] Other (See Comments)    Muscle aches     SOCIAL HISTORY/FAMILY HISTORY   Reviewed in Epic:  Pertinent findings:  Social History   Tobacco Use   Smoking status: Never   Smokeless tobacco: Never  Vaping Use   Vaping Use: Never used  Substance Use Topics   Alcohol use: Yes    Comment: occasionally   Drug use: No   Social History   Social History Narrative   [name] is a very pleasant married gentleman with 2 children (Phelps and Myton) each with 3 children/total 6 grandchildren. He is currently retired now from his original job in [employer], however he still serves on [employer] at [employer] and recently stepped down from [employer] at [employer].   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    For the last several months he has been off caffeine and wine because of GI upset symptoms and palpitations. Also trying to lose weight.     He used to drink maybe 2 glasses of wine at night, and he is subsequently all quit alcohol.      He is extremely active working out routinely doing squatting exercises and rides his bicycle or walks just about every day.  He does 300 push-ups to 3 times a week.  Over the summer he also enjoys kayaking (paddling anywhere  from 45 minutes to 1-1/2 hours).   At the end of the summer, he actually did a 3-1/2-hour  [place]  He did avoid going out into the open ocean to avoid the waves because he had no one paddling with him.  He did feel little bit tired after that and was worn out for the next day, but denied any chest pain or pressure.  No exertional dyspnea.       He and his wife spend Memorial Day to Labor Day at [employer] (Presumably on the [employer]).  He then returned to [facility] for the following winter.  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       He also was very fastidious on his diet.  He tries to cut down his caffeine intake because he had some palpitations off and on.  He says he is always high energy but does note a difference from when he was 82 years old compared to being 89.  He has had nothing like the symptoms he had prior to his CABG and is done remarkably well since his CABG.    OBJCTIVE -PE, EKG, labs   Wt Readings from Last 3 Encounters:  08/27/22 174 lb 12.8 oz (79.3 kg)  05/27/22 170 lb (77.1 kg)  04/25/22 179 lb (81.2 kg)    Physical Exam: BP 122/64 (BP Location: Left Arm, Patient Position: Sitting, Cuff Size: Normal)   Pulse (!) 58   Ht '5\' 7"'$  (1.702 m)   Wt 174 lb 12.8 oz (79.3 kg)   SpO2 96%   BMI 27.38 kg/m  Physical Exam Vitals reviewed.  Constitutional:      General: He is not in acute distress.    Appearance: Normal appearance. He is not ill-appearing or toxic-appearing.     Comments: Actually appears younger than stated age.  Seems very healthy.  Well-nourished and well-groomed.  HENT:     Head: Normocephalic and atraumatic.  Neck:     Vascular: No carotid bruit or JVD.  Cardiovascular:     Pulses: Normal pulses.     Heart sounds: Murmur (Very soft 1/6 SEM at RUSB.) heard.     No friction rub. No gallop.     Comments: Normal S1 with borderline split S2. Pulmonary:     Effort: Pulmonary effort is normal. No respiratory distress.     Breath  sounds: Normal breath sounds. No wheezing, rhonchi or rales.  Chest:     Chest wall: No tenderness.  Musculoskeletal:        General: No swelling. Normal range of motion.     Cervical back: Normal range of motion and neck supple.  Skin:    General: Skin is warm and dry.     Coloration: Skin is not pale.     Comments: Mild bruising  Neurological:     General: No focal deficit present.     Mental Status: He is alert and oriented to person, place, and time.     Gait: Gait normal.  Psychiatric:        Mood and Affect: Mood normal.        Behavior: Behavior normal.        Thought Content: Thought content normal.        Judgment: Judgment normal.     Adult ECG Report N/A  Recent Labs:   07/31/2022: TC 121, TG 49, HDL 34,  LDL 77*; A1c 5.2.;  K+ 4.5; TSH 3.65  No results found for: "CHOL", "HDL", "LDLCALC", "LDLDIRECT", "TRIG", "CHOLHDL" Lab Results  Component Value Date   CREATININE 0.80 05/28/2022   BUN 25 (H) 05/28/2022   NA 139 05/28/2022   K 3.9 05/28/2022   CL 104 05/28/2022   CO2 30 08/21/2021      Latest Ref Rng & Units 05/28/2022    5:30 PM 08/20/2021    2:24 AM 08/19/2021   10:03 PM  CBC  WBC 4.0 - 10.5 K/uL  7.3  6.8   Hemoglobin 13.0 - 17.0 g/dL 14.6  13.6  14.3   Hematocrit 39.0 - 52.0 % 43.0  40.4  43.2   Platelets 150 - 400 K/uL  205  222     Lab Results  Component Value Date   HGBA1C 5.7 (H) 04/02/2017   No results found for: "TSH"  ================================================== I spent a total of 21 minutes with the patient spent in direct patient consultation.  Additional time spent with chart review  / charting (studies, outside notes, etc): 12 min Total Time: 33 min  Current medicines are reviewed at length with the patient today.  (+/- concerns) none  Notice: This dictation was prepared with Dragon dictation along with smart phrase technology. Any transcriptional errors that result from this process are unintentional and may not be corrected  upon review.  Studies Ordered:   No orders of the defined types were placed in this encounter.  No orders of the defined types were placed in this encounter.   Patient Instructions / Medication Changes & Studies & Tests Ordered   Patient Instructions  Medication Instructions:   No changes   *If you need a refill on your cardiac medications before your next appointment, please call your pharmacy*   Lab Work: Not needed  Recommend you discuss with Primary Dr Joylene Draft to check your cholesterol level in 6 moths rather than 12 months    Testing/Procedures:  Not needed  Follow-Up: At [facility], you and your health needs are our priority.  As part of our continuing mission to provide you with exceptional heart care, we have created designated Provider Care Teams.  These Care Teams include your primary Cardiologist (physician) and Advanced Practice Providers (APPs -  Physician Assistants and Nurse Practitioners) who all work together to provide you with the care you need, when you need it.     Your next appointment:   12 month(s)  The format for your next appointment:   In Person  Provider:   Glenetta Hew, MD        Leonie Man, MD, MS Jordan [name], M.D., M.S. Interventional Cardiologist  [facility]  Pager # [phone] Phone # [phone] [address] [place], [place] [address]   Thank you for choosing [facility] [place]!!

## 2022-08-27 NOTE — Patient Instructions (Signed)
Medication Instructions:   No changes   *If you need a refill on your cardiac medications before your next appointment, please call your pharmacy*   Lab Work: Not needed  Recommend you discuss with Primary Dr Joylene Draft to check your cholesterol level in 6 moths rather than 12 months    Testing/Procedures:  Not needed  Follow-Up: At [facility], you and your health needs are our priority.  As part of our continuing mission to provide you with exceptional heart care, we have created designated Provider Care Teams.  These Care Teams include your primary Cardiologist (physician) and Advanced Practice Providers (APPs -  Physician Assistants and Nurse Practitioners) who all work together to provide you with the care you need, when you need it.     Your next appointment:   12 month(s)  The format for your next appointment:   In Person  Provider:   Glenetta Hew, MD

## 2022-08-29 DIAGNOSIS — M25561 Pain in right knee: Secondary | ICD-10-CM | POA: Diagnosis not present

## 2022-08-29 DIAGNOSIS — M25562 Pain in left knee: Secondary | ICD-10-CM | POA: Diagnosis not present

## 2022-08-29 DIAGNOSIS — Z96651 Presence of right artificial knee joint: Secondary | ICD-10-CM | POA: Diagnosis not present

## 2022-08-29 DIAGNOSIS — M5136 Other intervertebral disc degeneration, lumbar region: Secondary | ICD-10-CM | POA: Diagnosis not present

## 2022-09-03 ENCOUNTER — Ambulatory Visit (INDEPENDENT_AMBULATORY_CARE_PROVIDER_SITE_OTHER): Payer: Medicare Other

## 2022-09-03 DIAGNOSIS — I639 Cerebral infarction, unspecified: Secondary | ICD-10-CM | POA: Diagnosis not present

## 2022-09-05 LAB — CUP PACEART REMOTE DEVICE CHECK
Date Time Interrogation Session: 20240112231229
Implantable Pulse Generator Implant Date: 20201216

## 2022-09-07 NOTE — Progress Notes (Signed)
Carelink Summary Report / Loop Recorder

## 2022-09-09 ENCOUNTER — Encounter: Payer: Self-pay | Admitting: Cardiology

## 2022-09-09 NOTE — Assessment & Plan Note (Signed)
Doing remarkably well following CABG.  Surveillance Myoview low risk, nonischemic with no infarction.  Normal EF.  That corresponds well with his lack of symptoms.  He has not really required that recently of any significant medications.  Plan: Continue aspirin 81 mg On low-dose losartan with adequate blood pressure control.  Not on beta-blocker because of bradycardia and fatigue. On low-dose pravastatin plus Zetia with borderline lipid control.  LDL was 77.  Would recheck roughly 6 months out and see where he stands, low threshold to increase to 40 mg pravastatin versus adding Nexletol.

## 2022-09-09 NOTE — Assessment & Plan Note (Signed)
Pretty well-controlled on low-moderate-dose Diovan

## 2022-09-09 NOTE — Assessment & Plan Note (Signed)
He is on low-dose pravastatin as well as anemia.  Had memory issues with Crestor.  Most recent labs are borderline, but not yet at goal.  Would reassess in roughly 6 months.  If not at goal, would have a low threshold to increase pravastatin a slightly higher dose.  Could also consider Nexletol converting Zetia and Nexletol to Nexlizet.

## 2022-09-09 NOTE — Assessment & Plan Note (Signed)
Doing remarkably well since his CABG.  Nonischemic Myoview

## 2022-09-09 NOTE — Assessment & Plan Note (Signed)
He declined DOAC because of concerns for bleeding or bruising and [place] not wanting to have significant bleeding being quite isolated.  Has not had any recurrent episodes.  Remains on aspirin.

## 2022-09-09 NOTE — Assessment & Plan Note (Signed)
No recurrent symptoms.  Nothing yet seen on ILR.  Echo and TEE were normal.  Carotids normal.  Continue current CV risk factor modification

## 2022-09-11 DIAGNOSIS — E785 Hyperlipidemia, unspecified: Secondary | ICD-10-CM | POA: Diagnosis not present

## 2022-09-11 DIAGNOSIS — R4189 Other symptoms and signs involving cognitive functions and awareness: Secondary | ICD-10-CM | POA: Diagnosis not present

## 2022-09-11 DIAGNOSIS — I639 Cerebral infarction, unspecified: Secondary | ICD-10-CM | POA: Diagnosis not present

## 2022-09-11 DIAGNOSIS — I10 Essential (primary) hypertension: Secondary | ICD-10-CM | POA: Diagnosis not present

## 2022-10-01 DIAGNOSIS — H6123 Impacted cerumen, bilateral: Secondary | ICD-10-CM | POA: Diagnosis not present

## 2022-10-01 DIAGNOSIS — Z23 Encounter for immunization: Secondary | ICD-10-CM | POA: Diagnosis not present

## 2022-10-01 DIAGNOSIS — Z681 Body mass index (BMI) 19 or less, adult: Secondary | ICD-10-CM | POA: Diagnosis not present

## 2022-10-07 LAB — CUP PACEART REMOTE DEVICE CHECK
Date Time Interrogation Session: 20240214231533
Implantable Pulse Generator Implant Date: 20201216

## 2022-10-08 ENCOUNTER — Ambulatory Visit (INDEPENDENT_AMBULATORY_CARE_PROVIDER_SITE_OTHER): Payer: Medicare Other

## 2022-10-08 DIAGNOSIS — I639 Cerebral infarction, unspecified: Secondary | ICD-10-CM | POA: Diagnosis not present

## 2022-10-16 DIAGNOSIS — R4189 Other symptoms and signs involving cognitive functions and awareness: Secondary | ICD-10-CM | POA: Diagnosis not present

## 2022-10-16 DIAGNOSIS — I639 Cerebral infarction, unspecified: Secondary | ICD-10-CM | POA: Diagnosis not present

## 2022-10-24 NOTE — Progress Notes (Signed)
[facility] Summary Report / Loop Recorder 

## 2022-11-01 DIAGNOSIS — I69318 Other symptoms and signs involving cognitive functions following cerebral infarction: Secondary | ICD-10-CM | POA: Diagnosis not present

## 2022-11-12 ENCOUNTER — Ambulatory Visit (INDEPENDENT_AMBULATORY_CARE_PROVIDER_SITE_OTHER): Payer: Medicare Other

## 2022-11-12 DIAGNOSIS — I639 Cerebral infarction, unspecified: Secondary | ICD-10-CM

## 2022-11-12 LAB — CUP PACEART REMOTE DEVICE CHECK
Date Time Interrogation Session: 20240324232109
Implantable Pulse Generator Implant Date: 20201216

## 2022-11-20 DIAGNOSIS — Z8673 Personal history of transient ischemic attack (TIA), and cerebral infarction without residual deficits: Secondary | ICD-10-CM | POA: Diagnosis not present

## 2022-11-20 DIAGNOSIS — R413 Other amnesia: Secondary | ICD-10-CM | POA: Diagnosis not present

## 2022-11-20 DIAGNOSIS — R4189 Other symptoms and signs involving cognitive functions and awareness: Secondary | ICD-10-CM | POA: Diagnosis not present

## 2022-11-22 NOTE — Progress Notes (Signed)
[facility] Summary Report / Loop Recorder 

## 2022-11-27 DIAGNOSIS — R413 Other amnesia: Secondary | ICD-10-CM | POA: Diagnosis not present

## 2022-11-29 DIAGNOSIS — M25562 Pain in left knee: Secondary | ICD-10-CM | POA: Diagnosis not present

## 2022-11-29 DIAGNOSIS — M25561 Pain in right knee: Secondary | ICD-10-CM | POA: Diagnosis not present

## 2022-12-17 ENCOUNTER — Ambulatory Visit (INDEPENDENT_AMBULATORY_CARE_PROVIDER_SITE_OTHER): Payer: Medicare Other

## 2022-12-17 DIAGNOSIS — I639 Cerebral infarction, unspecified: Secondary | ICD-10-CM

## 2022-12-17 LAB — CUP PACEART REMOTE DEVICE CHECK
Date Time Interrogation Session: 20240426230418
Implantable Pulse Generator Implant Date: 20201216

## 2022-12-25 NOTE — Progress Notes (Signed)
[facility] Summary Report / Loop Recorder 

## 2023-01-02 DIAGNOSIS — D1801 Hemangioma of skin and subcutaneous tissue: Secondary | ICD-10-CM | POA: Diagnosis not present

## 2023-01-02 DIAGNOSIS — D1722 Benign lipomatous neoplasm of skin and subcutaneous tissue of left arm: Secondary | ICD-10-CM | POA: Diagnosis not present

## 2023-01-02 DIAGNOSIS — D225 Melanocytic nevi of trunk: Secondary | ICD-10-CM | POA: Diagnosis not present

## 2023-01-02 DIAGNOSIS — D485 Neoplasm of uncertain behavior of skin: Secondary | ICD-10-CM | POA: Diagnosis not present

## 2023-01-02 DIAGNOSIS — D2261 Melanocytic nevi of right upper limb, including shoulder: Secondary | ICD-10-CM | POA: Diagnosis not present

## 2023-01-02 DIAGNOSIS — Z85820 Personal history of malignant melanoma of skin: Secondary | ICD-10-CM | POA: Diagnosis not present

## 2023-01-02 DIAGNOSIS — L821 Other seborrheic keratosis: Secondary | ICD-10-CM | POA: Diagnosis not present

## 2023-01-02 DIAGNOSIS — D2271 Melanocytic nevi of right lower limb, including hip: Secondary | ICD-10-CM | POA: Diagnosis not present

## 2023-01-02 DIAGNOSIS — L570 Actinic keratosis: Secondary | ICD-10-CM | POA: Diagnosis not present

## 2023-01-02 DIAGNOSIS — Z85828 Personal history of other malignant neoplasm of skin: Secondary | ICD-10-CM | POA: Diagnosis not present

## 2023-01-02 DIAGNOSIS — L812 Freckles: Secondary | ICD-10-CM | POA: Diagnosis not present

## 2023-01-18 NOTE — Progress Notes (Signed)
[facility] Summary Report / Loop Recorder 

## 2023-01-21 ENCOUNTER — Ambulatory Visit (INDEPENDENT_AMBULATORY_CARE_PROVIDER_SITE_OTHER): Payer: Medicare Other

## 2023-01-21 DIAGNOSIS — I639 Cerebral infarction, unspecified: Secondary | ICD-10-CM

## 2023-01-21 LAB — CUP PACEART REMOTE DEVICE CHECK
Date Time Interrogation Session: 20240602231021
Implantable Pulse Generator Implant Date: 20201216

## 2023-01-30 DIAGNOSIS — I10 Essential (primary) hypertension: Secondary | ICD-10-CM | POA: Diagnosis not present

## 2023-01-30 DIAGNOSIS — E785 Hyperlipidemia, unspecified: Secondary | ICD-10-CM | POA: Diagnosis not present

## 2023-01-30 DIAGNOSIS — R7989 Other specified abnormal findings of blood chemistry: Secondary | ICD-10-CM | POA: Diagnosis not present

## 2023-02-13 NOTE — Progress Notes (Signed)
[facility] Summary Report / Loop Recorder 

## 2023-02-25 ENCOUNTER — Ambulatory Visit (INDEPENDENT_AMBULATORY_CARE_PROVIDER_SITE_OTHER): Payer: Medicare Other

## 2023-02-25 DIAGNOSIS — I639 Cerebral infarction, unspecified: Secondary | ICD-10-CM | POA: Diagnosis not present

## 2023-02-25 LAB — CUP PACEART REMOTE DEVICE CHECK
Date Time Interrogation Session: 20240705230422
Implantable Pulse Generator Implant Date: 20201216

## 2023-03-14 NOTE — Progress Notes (Signed)
[facility] Summary Report / Loop Recorder 

## 2023-04-01 ENCOUNTER — Ambulatory Visit (INDEPENDENT_AMBULATORY_CARE_PROVIDER_SITE_OTHER): Payer: Medicare Other

## 2023-04-01 DIAGNOSIS — I639 Cerebral infarction, unspecified: Secondary | ICD-10-CM

## 2023-04-16 NOTE — Progress Notes (Signed)
[facility] Summary Report / Loop Recorder 

## 2023-04-17 DIAGNOSIS — Z23 Encounter for immunization: Secondary | ICD-10-CM | POA: Diagnosis not present

## 2023-05-01 DIAGNOSIS — E785 Hyperlipidemia, unspecified: Secondary | ICD-10-CM | POA: Diagnosis not present

## 2023-05-01 DIAGNOSIS — I6340 Cerebral infarction due to embolism of unspecified cerebral artery: Secondary | ICD-10-CM | POA: Diagnosis not present

## 2023-05-01 DIAGNOSIS — D485 Neoplasm of uncertain behavior of skin: Secondary | ICD-10-CM | POA: Diagnosis not present

## 2023-05-01 DIAGNOSIS — R413 Other amnesia: Secondary | ICD-10-CM | POA: Diagnosis not present

## 2023-05-01 DIAGNOSIS — M1990 Unspecified osteoarthritis, unspecified site: Secondary | ICD-10-CM | POA: Diagnosis not present

## 2023-05-01 DIAGNOSIS — E291 Testicular hypofunction: Secondary | ICD-10-CM | POA: Diagnosis not present

## 2023-05-01 DIAGNOSIS — I5032 Chronic diastolic (congestive) heart failure: Secondary | ICD-10-CM | POA: Diagnosis not present

## 2023-05-01 DIAGNOSIS — Z85828 Personal history of other malignant neoplasm of skin: Secondary | ICD-10-CM | POA: Diagnosis not present

## 2023-05-01 DIAGNOSIS — I110 Hypertensive heart disease with heart failure: Secondary | ICD-10-CM | POA: Diagnosis not present

## 2023-05-01 DIAGNOSIS — R7301 Impaired fasting glucose: Secondary | ICD-10-CM | POA: Diagnosis not present

## 2023-05-01 DIAGNOSIS — K0510 Chronic gingivitis, plaque induced: Secondary | ICD-10-CM | POA: Diagnosis not present

## 2023-05-01 DIAGNOSIS — D6862 Lupus anticoagulant syndrome: Secondary | ICD-10-CM | POA: Diagnosis not present

## 2023-05-01 DIAGNOSIS — I2510 Atherosclerotic heart disease of native coronary artery without angina pectoris: Secondary | ICD-10-CM | POA: Diagnosis not present

## 2023-05-01 DIAGNOSIS — C439 Malignant melanoma of skin, unspecified: Secondary | ICD-10-CM | POA: Diagnosis not present

## 2023-05-01 DIAGNOSIS — L988 Other specified disorders of the skin and subcutaneous tissue: Secondary | ICD-10-CM | POA: Diagnosis not present

## 2023-05-05 LAB — CUP PACEART REMOTE DEVICE CHECK
Date Time Interrogation Session: 20240911013345
Implantable Pulse Generator Implant Date: 20201216

## 2023-05-06 ENCOUNTER — Ambulatory Visit (INDEPENDENT_AMBULATORY_CARE_PROVIDER_SITE_OTHER): Payer: Medicare Other

## 2023-05-06 DIAGNOSIS — I639 Cerebral infarction, unspecified: Secondary | ICD-10-CM

## 2023-05-15 DIAGNOSIS — Z96651 Presence of right artificial knee joint: Secondary | ICD-10-CM | POA: Diagnosis not present

## 2023-05-15 DIAGNOSIS — M25562 Pain in left knee: Secondary | ICD-10-CM | POA: Diagnosis not present

## 2023-05-15 DIAGNOSIS — M1711 Unilateral primary osteoarthritis, right knee: Secondary | ICD-10-CM | POA: Diagnosis not present

## 2023-05-15 DIAGNOSIS — M25561 Pain in right knee: Secondary | ICD-10-CM | POA: Diagnosis not present

## 2023-05-15 DIAGNOSIS — M1712 Unilateral primary osteoarthritis, left knee: Secondary | ICD-10-CM | POA: Diagnosis not present

## 2023-05-22 NOTE — Progress Notes (Signed)
[facility] Summary Report / Loop Recorder 

## 2023-06-07 DIAGNOSIS — S0101XA Laceration without foreign body of scalp, initial encounter: Secondary | ICD-10-CM | POA: Diagnosis not present

## 2023-06-07 DIAGNOSIS — S0990XA Unspecified injury of head, initial encounter: Secondary | ICD-10-CM | POA: Diagnosis not present

## 2023-06-10 ENCOUNTER — Ambulatory Visit: Payer: Medicare Other

## 2023-06-10 DIAGNOSIS — I639 Cerebral infarction, unspecified: Secondary | ICD-10-CM | POA: Diagnosis not present

## 2023-06-10 LAB — CUP PACEART REMOTE DEVICE CHECK
Date Time Interrogation Session: 20241020230327
Implantable Pulse Generator Implant Date: 20201216

## 2023-06-14 DIAGNOSIS — S0101XA Laceration without foreign body of scalp, initial encounter: Secondary | ICD-10-CM | POA: Diagnosis not present

## 2023-06-14 DIAGNOSIS — I10 Essential (primary) hypertension: Secondary | ICD-10-CM | POA: Diagnosis not present

## 2023-06-14 DIAGNOSIS — W19XXXA Unspecified fall, initial encounter: Secondary | ICD-10-CM | POA: Diagnosis not present

## 2023-06-27 NOTE — Progress Notes (Signed)
[facility] Summary Report / Loop Recorder 

## 2023-07-09 DIAGNOSIS — L80 Vitiligo: Secondary | ICD-10-CM | POA: Diagnosis not present

## 2023-07-09 DIAGNOSIS — L570 Actinic keratosis: Secondary | ICD-10-CM | POA: Diagnosis not present

## 2023-07-09 DIAGNOSIS — L821 Other seborrheic keratosis: Secondary | ICD-10-CM | POA: Diagnosis not present

## 2023-07-09 DIAGNOSIS — D1801 Hemangioma of skin and subcutaneous tissue: Secondary | ICD-10-CM | POA: Diagnosis not present

## 2023-07-09 DIAGNOSIS — L812 Freckles: Secondary | ICD-10-CM | POA: Diagnosis not present

## 2023-07-09 DIAGNOSIS — Z85828 Personal history of other malignant neoplasm of skin: Secondary | ICD-10-CM | POA: Diagnosis not present

## 2023-07-15 ENCOUNTER — Ambulatory Visit: Payer: Medicare Other

## 2023-07-15 DIAGNOSIS — I639 Cerebral infarction, unspecified: Secondary | ICD-10-CM | POA: Diagnosis not present

## 2023-07-15 LAB — CUP PACEART REMOTE DEVICE CHECK
Date Time Interrogation Session: 20241122230310
Implantable Pulse Generator Implant Date: 20201216

## 2023-08-15 NOTE — Progress Notes (Signed)
[facility] Summary Report / Loop Recorder 

## 2023-08-19 ENCOUNTER — Ambulatory Visit (INDEPENDENT_AMBULATORY_CARE_PROVIDER_SITE_OTHER): Payer: Medicare Other

## 2023-08-19 DIAGNOSIS — I639 Cerebral infarction, unspecified: Secondary | ICD-10-CM

## 2023-08-19 LAB — CUP PACEART REMOTE DEVICE CHECK
Date Time Interrogation Session: 20241229230345
Implantable Pulse Generator Implant Date: 20201216

## 2023-08-22 ENCOUNTER — Telehealth: Payer: Self-pay

## 2023-08-22 NOTE — Telephone Encounter (Signed)
 Primary Cardiologist:David Anner, MD  Chart reviewed as part of pre-operative protocol coverage. Because of Jordan [name]'s past medical history and time since last visit, he/she will require a follow-up visit in order to better assess preoperative cardiovascular risk.  Pre-op  covering staff: - Please schedule appointment and call patient to inform them. - Please contact requesting surgeon's office via preferred method (i.e, phone, fax) to inform them of need for appointment prior to surgery.  Request to hold aspirin  for spinal anesthesia/left total knee arthroplasty to be determined at time of visit.   Rosaline EMERSON Bane, NP-C  08/22/2023, 4:43 PM [address] Office [phone] Fax [phone]

## 2023-08-22 NOTE — Telephone Encounter (Signed)
   Pre-operative Risk Assessment    Patient Name: Jordan [name]  DOB: March 10, 1941 MRN: [id number]   Date of last office visit: 06/10/23 Dr Inocencio Date of next office visit: NONE   Request for Surgical Clearance    Procedure:   LEFT TOTAL KNEE ARTHROPLASTY  Date of Surgery:  Clearance 10/24/23                                Surgeon:  DR DONNICE CAR Surgeon's Group or Practice Name:  [facility] Phone number:  [phone] Fax number:  [phone]   Type of Clearance Requested:   - Medical  - Pharmacy:  Hold Aspirin      Type of Anesthesia:  Spinal   Additional requests/questions:    Signed, Asier Desroches   08/22/2023, 4:15 PM

## 2023-08-23 NOTE — Telephone Encounter (Signed)
1st attempt to reach pt regarding surgical clearance and the need for an IN OFFICE appointment.  Left pt a detailed message to call back and get that scheduled.

## 2023-08-23 NOTE — Telephone Encounter (Signed)
 S/w the pt and he has  been scheduled to see Edd Fabian, FNP 09/09/23 at [facility] office. Pt thanked me for the call and the help. I will update all parties involved.

## 2023-08-27 DIAGNOSIS — E291 Testicular hypofunction: Secondary | ICD-10-CM | POA: Diagnosis not present

## 2023-08-28 DIAGNOSIS — H1045 Other chronic allergic conjunctivitis: Secondary | ICD-10-CM | POA: Diagnosis not present

## 2023-08-28 DIAGNOSIS — H04123 Dry eye syndrome of bilateral lacrimal glands: Secondary | ICD-10-CM | POA: Diagnosis not present

## 2023-08-28 DIAGNOSIS — H0102A Squamous blepharitis right eye, upper and lower eyelids: Secondary | ICD-10-CM | POA: Diagnosis not present

## 2023-08-28 DIAGNOSIS — H43813 Vitreous degeneration, bilateral: Secondary | ICD-10-CM | POA: Diagnosis not present

## 2023-08-28 DIAGNOSIS — Z125 Encounter for screening for malignant neoplasm of prostate: Secondary | ICD-10-CM | POA: Diagnosis not present

## 2023-08-28 DIAGNOSIS — R7301 Impaired fasting glucose: Secondary | ICD-10-CM | POA: Diagnosis not present

## 2023-08-28 DIAGNOSIS — E785 Hyperlipidemia, unspecified: Secondary | ICD-10-CM | POA: Diagnosis not present

## 2023-08-28 DIAGNOSIS — D3132 Benign neoplasm of left choroid: Secondary | ICD-10-CM | POA: Diagnosis not present

## 2023-08-28 DIAGNOSIS — H2513 Age-related nuclear cataract, bilateral: Secondary | ICD-10-CM | POA: Diagnosis not present

## 2023-08-28 DIAGNOSIS — H0102B Squamous blepharitis left eye, upper and lower eyelids: Secondary | ICD-10-CM | POA: Diagnosis not present

## 2023-08-28 DIAGNOSIS — I2510 Atherosclerotic heart disease of native coronary artery without angina pectoris: Secondary | ICD-10-CM | POA: Diagnosis not present

## 2023-08-28 DIAGNOSIS — I10 Essential (primary) hypertension: Secondary | ICD-10-CM | POA: Diagnosis not present

## 2023-08-29 DIAGNOSIS — M25562 Pain in left knee: Secondary | ICD-10-CM | POA: Diagnosis not present

## 2023-09-02 DIAGNOSIS — M1712 Unilateral primary osteoarthritis, left knee: Secondary | ICD-10-CM | POA: Diagnosis not present

## 2023-09-03 DIAGNOSIS — H6123 Impacted cerumen, bilateral: Secondary | ICD-10-CM | POA: Diagnosis not present

## 2023-09-03 DIAGNOSIS — Z6826 Body mass index (BMI) 26.0-26.9, adult: Secondary | ICD-10-CM | POA: Diagnosis not present

## 2023-09-05 NOTE — Progress Notes (Unsigned)
Cardiology Clinic Note   Patient Name: Jordan [name] Date of Encounter: 09/09/2023  Primary Care Provider:  Rodrigo Ran, MD Primary Cardiologist:  Bryan Lemma, MD  Patient Profile    Jordan [name] 83 year old male presents to the clinic today for follow-up evaluation of his coronary artery disease and essential hypertension.  Past Medical History    Past Medical History:  Diagnosis Date   Carotid stenosis, asymptomatic 07/27/2011   Cryptogenic stroke (HCC) 04/2019   Minimal carotid disease, normal TTE/TEE; LINQ ILR Placed -> no documented A-fib   DDD (degenerative disc disease)    ED (erectile dysfunction)    Essential hypertension 01/29/2017   GERD (gastroesophageal reflux disease)    History of hiatal hernia    History of kidney stones    passed stones - no surgery required   Hyperlipidemia with target low density lipoprotein (LDL) cholesterol less than 70 mg/dL 08/65/7846   Impaired fasting glucose    Knee pain    bilateral   Left main coronary artery disease 03/18/2017   Cardiac Cath: 55% dLM, tandem p-mLAD 75% --> referred for CABG.   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 Laneta Simmers @ [facility]):  LIMA-LAD, SVG-D2, SVG-OM, SVG-PDA (endoscopic SVG harvesting from right leg).   Shoulder pain    Wears hearing aid    Past Surgical History:  Procedure Laterality Date   APPENDECTOMY     2020   BUBBLE STUDY  08/05/2019   Procedure: BUBBLE STUDY;  Surgeon: Jodelle Red, MD;  Location: [facility] ENDOSCOPY;  Service: Cardiovascular;;   Cardiac Stress Test  2008   Normal   CARPAL TUNNEL RELEASE Left 1998   CARPAL TUNNEL RELEASE Right 2008   CHOLECYSTECTOMY, LAPAROSCOPIC  2021   in [place]   COLONOSCOPY  06/2013   Marina Goodell - polyps   CORONARY ARTERY BYPASS GRAFT N/A  04/04/2017   Procedure: CORONARY ARTERY BYPASS GRAFTING x 4  LIMA-LAD, SVG-D2, SVG-OM, SVG-PDA (endoscopic SVG harvesting from right leg);  Surgeon: Alleen Borne, MD;  Location: [facility] OR;  Service: Open Heart Surgery;  Laterality: N/A;   EXTRACORPOREAL SHOCK WAVE LITHOTRIPSY Right 10/23/2018   Procedure: EXTRACORPOREAL SHOCK WAVE LITHOTRIPSY (ESWL);  Surgeon: Alfredo Martinez, MD;  Location: [facility] ORS;  Service: Urology;  Laterality: Right;   HERNIA REPAIR Left    inguinal   HIATAL HERNIA REPAIR     no surgery per pt   INCISION AND DRAINAGE WOUND WITH NAILBED REPAIR Left 05/28/2022   Procedure: Irrigation and debridement and open repair bone nail plate nailbed and soft tissue structures;  Surgeon: Dominica Severin, MD;  Location: [facility] OR;  Service: Orthopedics;  Laterality: Left;  1 hr Block with IV Sedation   KNEE CARTILAGE SURGERY Bilateral    LEFT HEART CATH AND CORONARY ANGIOGRAPHY N/A 03/18/2017   Procedure: Left Heart Cath and Coronary Angiography;  Surgeon: Marykay Lex, MD;  Location: [facility] INVASIVE CV LAB;  Service: Cardiovascular:  55% dLM, p-mLAD 75% - mLAD 75%.  EF 55-65%. Mod non-obstructive RCA disease --> referred for CABG   LOOP RECORDER INSERTION N/A 08/05/2019   Procedure: LOOP RECORDER INSERTION;  Surgeon: Regan Lemming, MD;  Location: [facility] INVASIVE CV LAB;  Service: Cardiovascular;  Laterality: N/A;   MEDIAL PARTIAL KNEE REPLACEMENT  Right 2010   Partial right knee replacement   NM MYOVIEW LTD  01/2017   INTERMEDIATE RISK: Exercise tolerance was good at 10 minutes, however, fatigue and dyspnea were reporte-d -> hypertensive response to exercise.  3mm Horizontal ST depressions noted during stress in the II, III, aVF, V6, V5 and V4 leads c/w ischemia.   Small siize, moderate severity perfusion defect  mid to distal anterior wall perfusion defect suggestive of ischemia.Marland Kitchen    NM MYOVIEW LTD  05/07/2022   Treadmill Myoview: Average exercise capacity (7:00 min:s; 8.5 METS). Normal  HR response to exercise. Hypertensive response to exercise.  EF 55 to 65% (60%).  Normal size and function.  NO EVIDENCE OF ISCHEMIA OR INFARCTION -> NORMAL/LOW RISK   ROTATOR CUFF REPAIR Left 2008   SHOULDER ARTHROSCOPY W/ ROTATOR CUFF REPAIR Right 06/15/2016   TEE WITHOUT CARDIOVERSION N/A 04/04/2017   Procedure: TRANSESOPHAGEAL ECHOCARDIOGRAM (TEE);  Surgeon: Alleen Borne, MD;  Location: [facility] OR;  Service: Open Heart Surgery;  Laterality: N/A;   TEE WITHOUT CARDIOVERSION N/A 08/05/2019   Procedure: TRANSESOPHAGEAL ECHOCARDIOGRAM (TEE);  Surgeon: Jodelle Red, MD;  Location: [facility] ENDOSCOPY:: EF 60 to 65%, no RWMA.  Normal atria, normal RV.  Normal valves.  No vegetation noted.  No LAA thrombus.  Negative bubble study.  Mild-diffuse aortic atherosclerosis   TONSILLECTOMY     TRANSTHORACIC ECHOCARDIOGRAM  08/20/2021   a) 05/21/2019: EF 60 to 65%.  No LVH.  Indeterminate LV filling.  No RWMA.  Normal RV.  Normal atria.  Normal aortic and mitral valves other than mild aortic sclerosis.  Normal RVSP and RAP.;; b) 08/20/2021:    Allergies  Allergies  Allergen Reactions   Aspirin     Other reaction(s): excessive bleeding   Nexium [Esomeprazole]     Unknown reaction   Rosuvastatin Other (See Comments)   Crestor [Rosuvastatin Calcium] Other (See Comments)    Muscle aches     History of Present Illness    Jordan [name] has a PMH of HTN, HLD, cryptogenic CVA, carotid stenosis, GERD, coronary artery disease status post CABG x 4, and DOE.  He was seen in follow-up by Dr. Herbie Baltimore on 08/27/2022.  During that time he presented for 44-month follow-up.  He was doing well.  He was walking 2-1/2 to 3 hours [place]  His limitation was due to his knee pain.  He continued to do routine calisthenic workouts with push-ups sit ups and stretching.  He denied anginal symptoms, PND, orthopnea, lower extremity edema, and palpitations.  He did feel that he had slid down a little bit  over the last couple years which he attributed to his knee pain.  He presents to the clinic today for follow-up evaluation and preoperative cardiac evaluation.  He states he continues to be very physically active.  He continues to use his chainsaw regularly.  He continues his calisthenic exercises.  He completed 300 push-ups this morning.  His blood pressure is well-controlled at 110/56.  His EKG shows sinus rhythm with first-degree AV block.  I will continue his current diet, physical activity, and medications.  Will plan follow-up in 12 months..  Today he denies chest pain, shortness of breath, lower extremity edema, fatigue, palpitations, melena, hematuria, hemoptysis, diaphoresis, weakness, presyncope, syncope, orthopnea, and PND.    Home Medications    Prior to Admission medications   Medication Sig Start Date End Date Taking? Authorizing Provider  7-Keto DHEA POWD by Does not apply route.  [provider]  Ascorbic Acid (VITAMIN C) 1000 MG tablet Take 1,000 mg by mouth daily.    [provider]  aspirin EC 81 MG tablet Take 81 mg by mouth daily.    [provider]  B Complex Vitamins (B COMPLEX 1 PO) Take 1 capsule by mouth daily as needed (for B complex vitamin levels).    [provider]  Cholecalciferol (VITAMIN D3) 25 MCG (1000 UT) CAPS Take 1 capsule by mouth daily. 11/29/09   [provider]  cyanocobalamin (VITAMIN B12) 500 MCG tablet Take 500 mcg by mouth daily.    [provider]  EPINEPHrine 0.3 mg/0.3 mL IJ SOAJ injection Inject 0.3 mg into the muscle as needed for anaphylaxis. 05/06/19   [provider]  ezetimibe (ZETIA) 10 MG tablet Take 10 mg by mouth daily.     [provider]  Nutritional Supplements (KETO PO) Take 1 capsule by mouth daily. Medication is called Keto DHEA 7    [provider]  pantoprazole (PROTONIX) 40 MG tablet Take 40 mg by mouth daily. 08/05/21   [provider]   pravastatin (PRAVACHOL) 20 MG tablet Take 20 mg by mouth every evening.  10/18/18   [provider]  tadalafil (ADCIRCA/CIALIS) 20 MG tablet Take 20 mg by mouth daily as needed for erectile dysfunction.    [provider]  Testosterone 25 MG/2.5GM (1%) GEL 1 mL by Other route See admin instructions. APPLY 4 CLICKS (1 ML) TO ALTERNATING SHOULDERS DAILY 02/22/22   [provider]  valsartan (DIOVAN) 80 MG tablet Take 1 tablet by mouth daily. 03/21/20   [provider]  Zinc 30 MG TABS Take 1 tablet by mouth daily.    [provider]    Family History    Family History  Problem Relation Age of Onset   Heart failure Mother    Cancer Mother        breast   Coronary artery disease Father        CABG   Heart attack Father        Long-term smoker.   Hyperlipidemia Father    Hyperlipidemia Brother    Hypertension Brother    Hyperlipidemia Son    Healthy Daughter    Colon cancer Neg Hx    Colon polyps Neg Hx    Rectal cancer Neg Hx    Stomach cancer Neg Hx    He indicated that his mother is deceased. He indicated that his father is deceased. He indicated that his brother is alive. He indicated that his daughter is alive. He indicated that his son is alive. He indicated that the status of his neg hx is unknown.  Social History    Social History   Socioeconomic History   Marital status: Married    Spouse name: Not on file   Number of children: 2   Years of education: Not on file   Highest education level: Master's degree (e.g., MA, MS, MEng, MEd, MSW, MBA)  Occupational History   Occupation: retired    Comment: Airline pilot  Tobacco Use   Smoking status: Never   Smokeless tobacco: Never  Vaping Use   Vaping status: Never Used  Substance and Sexual Activity   Alcohol use: Yes    Comment: occasionally   Drug use: No   Sexual activity: Yes  Other Topics Concern   Not on file  Social History Narrative   Jordan [name] is a very pleasant married  gentleman with 2  children Doroteo Glassman and Alvino Chapel) each with 3 children/total 6 grandchildren. He is currently retired now from his original job in [employer], however he still serves on [employer] and recently stepped down from [employer] at [employer].   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    For the last several months he has been off caffeine and wine because of GI upset symptoms and palpitations. Also trying to lose weight.     He used to drink maybe 2 glasses of wine at night, and he is subsequently all quit alcohol.      He is extremely active working out routinely doing squatting exercises and rides his bicycle or walks just about every day.  He does 300 push-ups to 3 times a week.  Over the summer he also enjoys kayaking (paddling anywhere from 45 minutes to 1-1/2 hours).   At the end of the summer, he actually did a 3-1/2-hour paddle kayaking around [address] where they have a beach house.  He did avoid going out into the open ocean to avoid the waves because he had no one paddling with him.  He did feel little bit tired after that and was worn out for the next day, but denied any chest pain or pressure.  No exertional dyspnea.       He and his wife spend Memorial Day to Labor Day at [employer] (Presumably on the [employer]).  He then returned to [facility] for the following winter.  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       He also was very fastidious on his diet.  He tries to cut down his caffeine intake because he had some palpitations off and on.  He says he is always high energy but does note a difference from when he was 83 years old compared to being 43.  He has had nothing like the symptoms he had prior to his CABG and is done remarkably well since his CABG.   Social Drivers of Corporate investment banker Strain: Not on file  Food Insecurity: Not on  file  Transportation Needs: Not on file  Physical Activity: Not on file  Stress: Not on file  Social Connections: Unknown (06/07/2023)   Received from [facility]   Social Network    Social Network: Not on file  Intimate Partner Violence: Unknown (06/07/2023)   Received from [facility]   HITS    Physically Hurt: Not on file    Insult or Talk Down To: Not on file    Threaten Physical Harm: Not on file    Scream or Curse: Not on file     Review of Systems    General:  No chills, fever, night sweats or weight changes.  Cardiovascular:  No chest pain, dyspnea on exertion, edema, orthopnea, palpitations, paroxysmal nocturnal dyspnea. Dermatological: No rash, lesions/masses Respiratory: No cough, dyspnea Urologic: No hematuria, dysuria Abdominal:   No nausea, vomiting, diarrhea, bright red blood per rectum, melena, or hematemesis Neurologic:  No visual changes, wkns, changes in mental status. All other systems reviewed and are otherwise negative except as noted above.  Physical Exam    VS:  BP (!) 110/56   Pulse 62   Ht 5' 6.5" (1.689 m)   Wt 174 lb (78.9 kg)   SpO2 90%   BMI 27.66 kg/m  , BMI  Body mass index is 27.66 kg/m. GEN: Well nourished, well developed, in no acute distress. HEENT: normal. Neck: Supple, no JVD, carotid bruits, or masses. Cardiac: RRR, no murmurs, rubs, or gallops. No clubbing, cyanosis, edema.  Radials/DP/PT 2+ and equal bilaterally.  Respiratory:  Respirations regular and unlabored, clear to auscultation bilaterally. GI: Soft, nontender, nondistended, BS + x 4. MS: no deformity or atrophy. Skin: warm and dry, no rash. Neuro:  Strength and sensation are intact. Psych: Normal affect.  Accessory Clinical Findings    Recent Labs: No results found for requested labs within last 365 days.   Recent Lipid Panel No results found for: "CHOL", "TRIG", "HDL", "CHOLHDL", "VLDL", "LDLCALC", "LDLDIRECT"       ECG personally reviewed by me today-  EKG Interpretation Date/Time:  Monday September 09 2023 08:55:23 EST Ventricular Rate:  62 PR Interval:  222 QRS Duration:  90 QT Interval:  384 QTC Calculation: 389 R Axis:   -25  Text Interpretation: Sinus rhythm with 1st degree A-V block When compared with ECG of 19-Aug-2021 21:44, PR interval has increased Confirmed by Edd Fabian [phone]) on 09/09/2023 8:56:38 AM   TEE 08/05/2019  IMPRESSIONS     1. Left ventricular ejection fraction, by visual estimation, is 60 to  65%. The left ventricle has normal function. There is no left ventricular  hypertrophy.   2. The left ventricle has no regional wall motion abnormalities.   3. Global right ventricle has normal systolic function.The right  ventricular size is normal. No increase in right ventricular wall  thickness.   4. Left atrial size was not assessed.   5. Right atrial size was not assessed.   6. The mitral valve is normal in structure. Trivial mitral valve  regurgitation.   7. No trisuspid valve vegetation visualized.   8. The tricuspid valve is normal in structure. Tricuspid valve  regurgitation is trivial.   9. The aortic valve is tricuspid. Aortic valve regurgitation is not  visualized.  10. No pulmonic vegetation present.  11. The pulmonic valve was normal in structure. Pulmonic valve  regurgitation is trivial.  12. Moderate plaque invoving the descending aorta.  13. No cardiac source of embolism found. Negative for intra-atrial  shunting or clear PFO.   Echocardiogram 08/20/2021  IMPRESSIONS     1. Left ventricular ejection fraction, by estimation, is 60 to 65%. The  left ventricle has normal function. The left ventricle has no regional  wall motion abnormalities. Left ventricular diastolic parameters were  normal.   2. Right ventricular systolic function is normal. The right ventricular  size is normal.   3. Left atrial size was moderately dilated.   4. The mitral valve is abnormal. Trivial mitral valve  regurgitation. No  evidence of mitral stenosis.   5. The aortic valve is tricuspid. There is mild calcification of the  aortic valve. Aortic valve regurgitation is not visualized. Aortic valve  sclerosis is present, with no evidence of aortic valve stenosis.   6. The inferior vena cava is normal in size with greater than 50%  respiratory variability, suggesting right atrial pressure of 3 mmHg.   FINDINGS   Left Ventricle: Left ventricular ejection fraction, by estimation, is 60  to 65%. The left ventricle has normal function. The left ventricle has no  regional wall motion abnormalities. The left ventricular internal cavity  size was normal in size. There is   no left ventricular hypertrophy. Left ventricular diastolic parameters  were normal.   Right Ventricle: The right ventricular  size is normal. No increase in  right ventricular wall thickness. Right ventricular systolic function is  normal.   Left Atrium: Left atrial size was moderately dilated.   Right Atrium: Right atrial size was normal in size.   Pericardium: There is no evidence of pericardial effusion.   Mitral Valve: The mitral valve is abnormal. There is mild thickening of  the mitral valve leaflet(s). Trivial mitral valve regurgitation. No  evidence of mitral valve stenosis.   Tricuspid Valve: The tricuspid valve is normal in structure. Tricuspid  valve regurgitation is mild . No evidence of tricuspid stenosis.   Aortic Valve: The aortic valve is tricuspid. There is mild calcification  of the aortic valve. Aortic valve regurgitation is not visualized. Aortic  valve sclerosis is present, with no evidence of aortic valve stenosis.  Aortic valve mean gradient measures  4.0 mmHg. Aortic valve peak gradient measures 7.4 mmHg. Aortic valve area,  by VTI measures 2.49 cm.   Pulmonic Valve: The pulmonic valve was normal in structure. Pulmonic valve  regurgitation is not visualized. No evidence of pulmonic stenosis.    Aorta: The aortic root is normal in size and structure.   Venous: The inferior vena cava is normal in size with greater than 50%  respiratory variability, suggesting right atrial pressure of 3 mmHg.   IAS/Shunts: No atrial level shunt detected by color flow Doppler.         Assessment & Plan   1.  Coronary artery disease-denies recent episodes of chest discomfort and anginal symptoms.  Denies exertional chest pain.  Status post CABG.  Previous stress test showed low risk and no ischemia.  EF was normal. Continue current medical therapy Maintain physical activity Heart healthy low-sodium diet  Hyperlipidemia-LDL 71 on 08/28/23.  Did have memory loss with the rosuvastatin High-fiber diet Continue pravastatin, ezetimibe Maintain physical activity  Essential hypertension-BP today 110/56. Maintain blood pressure log Continue valsartan  History of cryptogenic CVA-neurologically intact.  Had reassuring TEE.  Carotid ultrasound also showed normal findings.  ILR interrogated 08/18/2023.  It showed normal histograms and no  arrhythmias Follows with PCP   Preoperative cardiac evaluation-left total knee arthroplasty, 10/24/2023, Dr. Durene Romans, [facility], fax #[phone]    Primary Cardiologist: Bryan Lemma, MD  Chart reviewed as part of pre-operative protocol coverage. Given past medical history and time since last visit, based on ACC/AHA guidelines, Jordan [name] would be at acceptable risk for the planned procedure without further cardiovascular testing.   His RCRI is moderate risk, 6.6% risk of major cardiac event.  He is able to complete greater than 4 METS of physical activity.  His aspirin is not prescribed by cardiology.  He has history of CVA.  Recommendations for holding aspirin will need to come from prescribing provider.  Patient was advised that if he develops new symptoms prior to surgery to contact our office to arrange a follow-up appointment.  He  verbalized understanding.  I will route this recommendation to the requesting party via Epic fax function and remove from pre-op pool.   Disposition: Follow-up with Dr. Herbie Baltimore or me in 9-12 months.    Thomasene Ripple. Mubashir Mallek NP-C     09/09/2023, 9:15 AM [facility] [address] Office [phone] Fax [phone]    I spent 14 minutes examining this patient, reviewing medications, and using patient centered shared decision making involving their cardiac care.   I spent greater than 20 minutes reviewing their past medical history,  medications,  and prior cardiac tests.

## 2023-09-06 DIAGNOSIS — I6529 Occlusion and stenosis of unspecified carotid artery: Secondary | ICD-10-CM | POA: Diagnosis not present

## 2023-09-06 DIAGNOSIS — R82998 Other abnormal findings in urine: Secondary | ICD-10-CM | POA: Diagnosis not present

## 2023-09-06 DIAGNOSIS — I110 Hypertensive heart disease with heart failure: Secondary | ICD-10-CM | POA: Diagnosis not present

## 2023-09-06 DIAGNOSIS — M25561 Pain in right knee: Secondary | ICD-10-CM | POA: Diagnosis not present

## 2023-09-06 DIAGNOSIS — N529 Male erectile dysfunction, unspecified: Secondary | ICD-10-CM | POA: Diagnosis not present

## 2023-09-06 DIAGNOSIS — Z0000 Encounter for general adult medical examination without abnormal findings: Secondary | ICD-10-CM | POA: Diagnosis not present

## 2023-09-06 DIAGNOSIS — I10 Essential (primary) hypertension: Secondary | ICD-10-CM | POA: Diagnosis not present

## 2023-09-06 DIAGNOSIS — M1990 Unspecified osteoarthritis, unspecified site: Secondary | ICD-10-CM | POA: Diagnosis not present

## 2023-09-06 DIAGNOSIS — E785 Hyperlipidemia, unspecified: Secondary | ICD-10-CM | POA: Diagnosis not present

## 2023-09-06 DIAGNOSIS — I2510 Atherosclerotic heart disease of native coronary artery without angina pectoris: Secondary | ICD-10-CM | POA: Diagnosis not present

## 2023-09-06 DIAGNOSIS — I5032 Chronic diastolic (congestive) heart failure: Secondary | ICD-10-CM | POA: Diagnosis not present

## 2023-09-06 DIAGNOSIS — C439 Malignant melanoma of skin, unspecified: Secondary | ICD-10-CM | POA: Diagnosis not present

## 2023-09-06 DIAGNOSIS — R413 Other amnesia: Secondary | ICD-10-CM | POA: Diagnosis not present

## 2023-09-06 DIAGNOSIS — Z8673 Personal history of transient ischemic attack (TIA), and cerebral infarction without residual deficits: Secondary | ICD-10-CM | POA: Diagnosis not present

## 2023-09-09 ENCOUNTER — Ambulatory Visit: Payer: Medicare Other | Attending: General Practice | Admitting: General Practice

## 2023-09-09 ENCOUNTER — Encounter: Payer: Self-pay | Admitting: General Practice

## 2023-09-09 VITALS — BP 110/56 | HR 62 | Ht 66.5 in | Wt 174.0 lb

## 2023-09-09 DIAGNOSIS — Z951 Presence of aortocoronary bypass graft: Secondary | ICD-10-CM

## 2023-09-09 DIAGNOSIS — Z01810 Encounter for preprocedural cardiovascular examination: Secondary | ICD-10-CM

## 2023-09-09 DIAGNOSIS — I10 Essential (primary) hypertension: Secondary | ICD-10-CM | POA: Diagnosis not present

## 2023-09-09 DIAGNOSIS — I639 Cerebral infarction, unspecified: Secondary | ICD-10-CM

## 2023-09-09 DIAGNOSIS — E785 Hyperlipidemia, unspecified: Secondary | ICD-10-CM

## 2023-09-09 NOTE — Patient Instructions (Signed)
Medication Instructions:  The current medical regimen is effective;  continue present plan and medications as directed. Please refer to the Current Medication list given to you today.  *If you need a refill on your cardiac medications before your next appointment, please call your pharmacy*  Lab Work: NONE  Other Instructions CONTINUE CURRENT DIET AND ACTIVITY LEVEL  Testing/Procedures: NONE  Follow-Up: At [facility], you and your health needs are our priority.  As part of our continuing mission to provide you with exceptional heart care, we have created designated Provider Care Teams.  These Care Teams include your primary Cardiologist (physician) and Advanced Practice Providers (APPs -  Physician Assistants and Nurse Practitioners) who all work together to provide you with the care you need, when you need it.  Your next appointment:   12 month(s)  Provider:   Bryan Lemma, MD

## 2023-09-23 ENCOUNTER — Ambulatory Visit (INDEPENDENT_AMBULATORY_CARE_PROVIDER_SITE_OTHER): Payer: Medicare Other

## 2023-09-23 DIAGNOSIS — I639 Cerebral infarction, unspecified: Secondary | ICD-10-CM

## 2023-09-23 LAB — CUP PACEART REMOTE DEVICE CHECK
Date Time Interrogation Session: 20250202230017
Implantable Pulse Generator Implant Date: 20201216

## 2023-09-26 ENCOUNTER — Other Ambulatory Visit (HOSPITAL_COMMUNITY): Payer: Self-pay | Admitting: Internal Medicine

## 2023-09-26 DIAGNOSIS — I779 Disorder of arteries and arterioles, unspecified: Secondary | ICD-10-CM

## 2023-09-30 ENCOUNTER — Ambulatory Visit (HOSPITAL_COMMUNITY)
Admission: RE | Admit: 2023-09-30 | Discharge: 2023-09-30 | Disposition: A | Discharge status: Home / Self Care | Payer: Medicare Other | Source: Ambulatory Visit | Attending: Surgery | Admitting: Surgery

## 2023-09-30 DIAGNOSIS — I779 Disorder of arteries and arterioles, unspecified: Secondary | ICD-10-CM | POA: Diagnosis not present

## 2023-10-04 DIAGNOSIS — M25562 Pain in left knee: Secondary | ICD-10-CM | POA: Diagnosis not present

## 2023-10-04 DIAGNOSIS — M1712 Unilateral primary osteoarthritis, left knee: Secondary | ICD-10-CM | POA: Diagnosis not present

## 2023-10-15 NOTE — Patient Instructions (Signed)
 SURGICAL WAITING ROOM VISITATION Patients having surgery or a procedure may have no more than 2 support people in the waiting area - these visitors may rotate.    Children under the age of 20 must have an adult with them who is not the patient.  Due to an increase in RSV and influenza rates and associated hospitalizations, children ages 53 and under may not visit patients in [facility] hospitals.   If the patient needs to stay at the hospital during part of their recovery, the visitor guidelines for inpatient rooms apply. Pre-op nurse will coordinate an appropriate time for 1 support person to accompany patient in pre-op.  This support person may not rotate.    Please refer to the [facility] website for the visitor guidelines for Inpatients (after your surgery is over and you are in a regular room).       Your procedure is scheduled on: 10-24-23   Report to [facility] Main Entrance    Report to admitting at 6:10 AM   Call this number if you have problems the morning of surgery [phone]   Do not eat food :After Midnight.   After Midnight you may have the following liquids until 5:40 AM DAY OF SURGERY  Water Non-Citrus Juices (without pulp, NO RED-Apple, White grape, White cranberry) Black Coffee (NO MILK/CREAM OR CREAMERS, sugar ok)  Clear Tea (NO MILK/CREAM OR CREAMERS, sugar ok) regular and decaf                             Plain Jell-O (NO RED)                                           Fruit ices (not with fruit pulp, NO RED)                                     Popsicles (NO RED)                                                               Sports drinks like Gatorade (NO RED)                   The day of surgery:  Drink ONE (1) Pre-Surgery Clear Ensure or G2 by 5:40 AM the morning of surgery. Drink in one sitting. Do not sip.  This drink was given to you during your hospital  pre-op appointment visit. Nothing else to drink after completing the Pre-Surgery  Clear Ensure or G2.          If you have questions, please contact your surgeon's office.   FOLLOW  ANY ADDITIONAL PRE OP INSTRUCTIONS YOU RECEIVED FROM YOUR SURGEON'S OFFICE!!!     Oral Hygiene is also important to reduce your risk of infection.                                    Remember - BRUSH YOUR TEETH THE MORNING OF SURGERY WITH  YOUR REGULAR TOOTHPASTE   Do NOT smoke after Midnight   Take these medicines the morning of surgery with A SIP OF WATER:    Ezetimibe   Pantoprazole  Stop all vitamins and herbal supplements 7 days before surgery  Bring CPAP mask and tubing day of surgery.                              You may not have any metal on your body including  jewelry, and body piercing             Do not wear  lotions, powders, cologne, or deodorant              Men may shave face and neck.   Do not bring valuables to the hospital. [facility] IS NOT RESPONSIBLE   FOR VALUABLES.   Contacts, dentures or bridgework may not be worn into surgery.   Bring small overnight bag day of surgery.   DO NOT BRING YOUR HOME MEDICATIONS TO THE HOSPITAL. PHARMACY WILL DISPENSE MEDICATIONS LISTED ON YOUR MEDICATION LIST TO YOU DURING YOUR ADMISSION IN THE HOSPITAL!   Special Instructions: Bring a copy of your healthcare power of attorney and living will documents the day of surgery if you haven't scanned them before.              Please read over the following fact sheets you were given: IF YOU HAVE QUESTIONS ABOUT YOUR PRE-OP INSTRUCTIONS PLEASE CALL [phone] Gwen  If you received a COVID test during your pre-op visit  it is requested that you wear a mask when out in public, stay away from anyone that may not be feeling well and notify your surgeon if you develop symptoms. If you test positive for Covid or have been in contact with anyone that has tested positive in the last 10 days please notify you surgeon.    Pre-operative 5 CHG Bath Instructions   You can play a key role  in reducing the risk of infection after surgery. Your skin needs to be as free of germs as possible. You can reduce the number of germs on your skin by washing with CHG (chlorhexidine gluconate) soap before surgery. CHG is an antiseptic soap that kills germs and continues to kill germs even after washing.   DO NOT use if you have an allergy to chlorhexidine/CHG or antibacterial soaps. If your skin becomes reddened or irritated, stop using the CHG and notify one of our RNs at [phone].   Please shower with the CHG soap starting 4 days before surgery using the following schedule:     Please keep in mind the following:  DO NOT shave, including legs and underarms, starting the day of your first shower.   You may shave your face at any point before/day of surgery.  Place clean sheets on your bed the day you start using CHG soap. Use a clean washcloth (not used since being washed) for each shower. DO NOT sleep with pets once you start using the CHG.   CHG Shower Instructions:  If you choose to wash your hair and private area, wash first with your normal shampoo/soap.  After you use shampoo/soap, rinse your hair and body thoroughly to remove shampoo/soap residue.  Turn the water OFF and apply about 3 tablespoons (45 ml) of CHG soap to a CLEAN washcloth.  Apply CHG soap ONLY FROM YOUR NECK DOWN TO YOUR TOES (washing for 3-5  minutes)  DO NOT use CHG soap on face, private areas, open wounds, or sores.  Pay special attention to the area where your surgery is being performed.  If you are having back surgery, having someone wash your back for you may be helpful. Wait 2 minutes after CHG soap is applied, then you may rinse off the CHG soap.  Pat dry with a clean towel  Put on clean clothes/pajamas   If you choose to wear lotion, please use ONLY the CHG-compatible lotions on the back of this paper.     Additional instructions for the day of surgery: DO NOT APPLY any lotions, deodorants, cologne, or  perfumes.   Put on clean/comfortable clothes.  Brush your teeth.  Ask your nurse before applying any prescription medications to the skin.      CHG Compatible Lotions   Aveeno Moisturizing lotion  Cetaphil Moisturizing Cream  Cetaphil Moisturizing Lotion  Clairol Herbal Essence Moisturizing Lotion, Dry Skin  Clairol Herbal Essence Moisturizing Lotion, Extra Dry Skin  Clairol Herbal Essence Moisturizing Lotion, Normal Skin  Curel Age Defying Therapeutic Moisturizing Lotion with Alpha Hydroxy  Curel Extreme Care Body Lotion  Curel Soothing Hands Moisturizing Hand Lotion  Curel Therapeutic Moisturizing Cream, Fragrance-Free  Curel Therapeutic Moisturizing Lotion, Fragrance-Free  Curel Therapeutic Moisturizing Lotion, Original Formula  Eucerin Daily Replenishing Lotion  Eucerin Dry Skin Therapy Plus Alpha Hydroxy Crme  Eucerin Dry Skin Therapy Plus Alpha Hydroxy Lotion  Eucerin Original Crme  Eucerin Original Lotion  Eucerin Plus Crme Eucerin Plus Lotion  Eucerin TriLipid Replenishing Lotion  Keri Anti-Bacterial Hand Lotion  Keri Deep Conditioning Original Lotion Dry Skin Formula Softly Scented  Keri Deep Conditioning Original Lotion, Fragrance Free Sensitive Skin Formula  Keri Lotion Fast Absorbing Fragrance Free Sensitive Skin Formula  Keri Lotion Fast Absorbing Softly Scented Dry Skin Formula  Keri Original Lotion  Keri Skin Renewal Lotion Keri Silky Smooth Lotion  Keri Silky Smooth Sensitive Skin Lotion  Nivea Body Creamy Conditioning Oil  Nivea Body Extra Enriched Lotion  Nivea Body Original Lotion  Nivea Body Sheer Moisturizing Lotion Nivea Crme  Nivea Skin Firming Lotion  NutraDerm 30 Skin Lotion  NutraDerm Skin Lotion  NutraDerm Therapeutic Skin Cream  NutraDerm Therapeutic Skin Lotion  ProShield Protective Hand Cream  Provon moisturizing lotion Y  PATIENT SIGNATURE_________________________________  NURSE  SIGNATURE__________________________________  ________________________________________________________________________    Rogelia Mire  An incentive spirometer is a tool that can help keep your lungs clear and active. This tool measures how well you are filling your lungs with each breath. Taking long deep breaths may help reverse or decrease the chance of developing breathing (pulmonary) problems (especially infection) following: A long period of time when you are unable to move or be active. BEFORE THE PROCEDURE  If the spirometer includes an indicator to show your best effort, your nurse or respiratory therapist will set it to a desired goal. If possible, sit up straight or lean slightly forward. Try not to slouch. Hold the incentive spirometer in an upright position. INSTRUCTIONS FOR USE  Sit on the edge of your bed if possible, or sit up as far as you can in bed or on a chair. Hold the incentive spirometer in an upright position. Breathe out normally. Place the mouthpiece in your mouth and seal your lips tightly around it. Breathe in slowly and as deeply as possible, raising the piston or the ball toward the top of the column. Hold your breath for 3-5 seconds or for as long as possible.  Allow the piston or ball to fall to the bottom of the column. Remove the mouthpiece from your mouth and breathe out normally. Rest for a few seconds and repeat Steps 1 through 7 at least 10 times every 1-2 hours when you are awake. Take your time and take a few normal breaths between deep breaths. The spirometer may include an indicator to show your best effort. Use the indicator as a goal to work toward during each repetition. After each set of 10 deep breaths, practice coughing to be sure your lungs are clear. If you have an incision (the cut made at the time of surgery), support your incision when coughing by placing a pillow or rolled up towels firmly against it. Once you are able to get out of  bed, walk around indoors and cough well. You may stop using the incentive spirometer when instructed by your caregiver.  RISKS AND COMPLICATIONS Take your time so you do not get dizzy or light-headed. If you are in pain, you may need to take or ask for pain medication before doing incentive spirometry. It is harder to take a deep breath if you are having pain. AFTER USE Rest and breathe slowly and easily. It can be helpful to keep track of a log of your progress. Your caregiver can provide you with a simple table to help with this. If you are using the spirometer at home, follow these instructions: SEEK MEDICAL CARE IF:  You are having difficultly using the spirometer. You have trouble using the spirometer as often as instructed. Your pain medication is not giving enough relief while using the spirometer. You develop fever of 100.5 F (38.1 C) or higher. SEEK IMMEDIATE MEDICAL CARE IF:  You cough up bloody sputum that had not been present before. You develop fever of 102 F (38.9 C) or greater. You develop worsening pain at or near the incision site. MAKE SURE YOU:  Understand these instructions. Will watch your condition. Will get help right away if you are not doing well or get worse. Document Released: 12/17/2006 Document Revised: 10/29/2011 Document Reviewed: 02/17/2007 [facility] Patient Information 2014 [place], [place].   ________________________________________________________________________

## 2023-10-15 NOTE — Progress Notes (Signed)
 COVID Vaccine Completed:  Yes  Date of COVID positive in last 90 days:  PCP - Rodrigo Ran, MD Cardiologist - Bryan Lemma, MD Electrophysiologist - Loman Brooklyn, MD Neurologist - Rebeca Tat, DO  Cardiac clearance in Epic dated 09-09-23  Chest x-ray -  EKG - 09-09-23 Epic Stress Test - 10-08-18 Epic ECHO - 08-20-21 Epic Cardiac Cath - 03-18-17 Epic Pacemaker/ICD device last checked: Spinal Cord Stimulator: Loop recorder - Last checked 09-22-23  Bowel Prep -   Sleep Study -  CPAP -   Fasting Blood Sugar -  Checks Blood Sugar _____ times a day  Last dose of GLP1 agonist-  N/A GLP1 instructions:  Hold 7 days before surgery    Last dose of SGLT-2 inhibitors-  N/A SGLT-2 instructions:  Hold 3 days before surgery    Blood Thinner Instructions:  Last dose:   Time: Aspirin Instructions: Last Dose:  Activity level:  Can go up a flight of stairs and perform activities of daily living without stopping and without symptoms of chest pain or shortness of breath.  Able to exercise without symptoms  Unable to go up a flight of stairs without symptoms of     Anesthesia review: CAD, HTN hx of stroke, carotid stenosis. Hx of CABG  Patient denies shortness of breath, fever, cough and chest pain at PAT appointment  Patient verbalized understanding of instructions that were given to them at the PAT appointment. Patient was also instructed that they will need to review over the PAT instructions again at home before surgery.

## 2023-10-17 ENCOUNTER — Encounter (HOSPITAL_COMMUNITY)
Admission: RE | Admit: 2023-10-17 | Discharge: 2023-10-17 | Disposition: A | Discharge status: Home / Self Care | Payer: Medicare Other | Source: Ambulatory Visit | Attending: Orthopedic Surgery | Admitting: Orthopedic Surgery

## 2023-10-17 ENCOUNTER — Encounter (HOSPITAL_COMMUNITY): Payer: Self-pay

## 2023-10-17 VITALS — BP 129/76 | HR 57 | Temp 98.1°F | Resp 16 | Ht 66.0 in | Wt 175.4 lb

## 2023-10-17 DIAGNOSIS — I10 Essential (primary) hypertension: Secondary | ICD-10-CM | POA: Diagnosis not present

## 2023-10-17 DIAGNOSIS — Z01812 Encounter for preprocedural laboratory examination: Secondary | ICD-10-CM | POA: Insufficient documentation

## 2023-10-17 DIAGNOSIS — Z01818 Encounter for other preprocedural examination: Secondary | ICD-10-CM

## 2023-10-17 LAB — BASIC METABOLIC PANEL
Anion gap: 7 (ref 5–15)
BUN: 20 mg/dL (ref 8–23)
CO2: 26 mmol/L (ref 22–32)
Calcium: 8.4 mg/dL — ABNORMAL LOW (ref 8.9–10.3)
Chloride: 105 mmol/L (ref 98–111)
Creatinine, Ser: 0.83 mg/dL (ref 0.61–1.24)
GFR, Estimated: >60 mL/min (ref >60)
Glucose, Bld: 84 mg/dL (ref 70–99)
Potassium: 4.1 mmol/L (ref 3.5–5.1)
Sodium: 138 mmol/L (ref 135–145)

## 2023-10-17 LAB — CBC
HCT: 35.5 % — ABNORMAL LOW (ref 39.0–52.0)
Hemoglobin: 11.7 g/dL — ABNORMAL LOW (ref 13.0–17.0)
MCH: 30.7 pg (ref 26.0–34.0)
MCHC: 33 g/dL (ref 30.0–36.0)
MCV: 93.2 fL (ref 80.0–100.0)
Platelets: 175 10*3/uL (ref 150–400)
RBC: 3.81 MIL/uL — ABNORMAL LOW (ref 4.22–5.81)
RDW: 16.6 % — ABNORMAL HIGH (ref 11.5–15.5)
WBC: 9.1 10*3/uL (ref 4.0–10.5)
nRBC: 0 % (ref 0.0–0.2)

## 2023-10-17 LAB — SURGICAL PCR SCREEN
MRSA, PCR: NEGATIVE
Staphylococcus aureus: NEGATIVE

## 2023-10-22 ENCOUNTER — Encounter (HOSPITAL_COMMUNITY): Payer: Self-pay

## 2023-10-22 NOTE — Progress Notes (Signed)
 Case: [id number] Date/Time: 10/24/23 0825   Procedure: ARTHROPLASTY, KNEE, UNICOMPARTMENTAL (Left: Knee)   Anesthesia type: Spinal   Pre-op diagnosis: Left knee  medial osteoarthritis   Location: [facility] ROOM 10 / [facility]   Surgeons: Durene Romans, MD       DISCUSSION: Jordan [name] is an 83 yo male who presents to PAT prior to surgery above. PMH HTN, CAD s/p CABG (2018), hx of cryptogenic CVA (2020), migraines, GERD, hiatal hernia s/p repair, arthritis.  Patient follows with Cardiology for CAD, HTN. Underwent a CABG in 2018. Last seen in clinic on 09/09/23 for follow up and pre op clearance. Patient reported being very active without symptoms. Cleared for surgery:  "Chart reviewed as part of pre-operative protocol coverage. Given past medical history and time since last visit, based on ACC/AHA guidelines, Nakai Ladarrious Kirksey would be at acceptable risk for the planned procedure without further cardiovascular testing.  His RCRI is moderate risk, 6.6% risk of major cardiac event.  He is able to complete greater than 4 METS of physical activity. His aspirin is not prescribed by cardiology.  He has history of CVA.  Recommendations for holding aspirin will need to come from prescribing provider."  Patient with hx of cryptogenic CVA in 2020. He has no residual deficits. For w/u he had a normal TEE.  Carotid ultrasound also showed normal findings.  ILR interrogated 08/18/2023.  It showed normal histograms and no arrhythmias. Patient follows with PCP and was last seen on 09/06/23 for annual f/u. Cleared as low risk (scanned in media tab on 09/10/23)    VS: BP 129/76   Pulse (!) 57   Temp 36.7 C   Resp 16   Ht 5\' 6"  (1.676 m)   Wt 79.6 kg   SpO2 98%   BMI 28.31 kg/m   PROVIDERS: Rodrigo Ran, MD Cardiologist - Bryan Lemma, MD Electrophysiologist - Loman Brooklyn, MD  LABS: Labs reviewed: Acceptable for surgery. (all labs ordered are listed, but only abnormal results are displayed)  Labs  Reviewed  BASIC METABOLIC PANEL - Abnormal; Notable for the following components:      Result Value   Calcium 8.4 (*)    All other components within normal limits  CBC - Abnormal; Notable for the following components:   RBC 3.81 (*)    Hemoglobin 11.7 (*)    HCT 35.5 (*)    RDW 16.6 (*)    All other components within normal limits  SURGICAL PCR SCREEN     IMAGES:   EKG 09/09/23:  Sinus rhythm with 1st degree A-V block, rate 62 When compared with ECG of 19-Aug-2021 21:44, PR interval has increased   CV:  Stress test 05/07/2022:    LV perfusion is normal. There is no evidence of ischemia. There is no evidence of infarction.   Average exercise capacity (7:00 min:s; 8.5 METS). Normal HR response to exercise. Hypertensive response to exercise.   Left ventricular function is normal. Nuclear stress EF: 60 %. The left ventricular ejection fraction is normal (55-65%). End diastolic cavity size is normal.   The study is normal. The study is low risk.  Echo 08/20/2021: IMPRESSIONS     1. Left ventricular ejection fraction, by estimation, is 60 to 65%. The  left ventricle has normal function. The left ventricle has no regional  wall motion abnormalities. Left ventricular diastolic parameters were  normal.   2. Right ventricular systolic function is normal. The right ventricular  size is normal.   3. Left atrial  size was moderately dilated.   4. The mitral valve is abnormal. Trivial mitral valve regurgitation. No  evidence of mitral stenosis.   5. The aortic valve is tricuspid. There is mild calcification of the  aortic valve. Aortic valve regurgitation is not visualized. Aortic valve  sclerosis is present, with no evidence of aortic valve stenosis.   6. The inferior vena cava is normal in size with greater than 50%  respiratory variability, suggesting right atrial pressure of 3 mmHg.   Past Medical History:  Diagnosis Date   Carotid stenosis, asymptomatic 07/27/2011    Cryptogenic stroke (HCC) 04/2019   Minimal carotid disease, normal TTE/TEE; LINQ ILR Placed -> no documented A-fib   DDD (degenerative disc disease)    ED (erectile dysfunction)    Essential hypertension 01/29/2017   GERD (gastroesophageal reflux disease)    History of hiatal hernia    History of kidney stones    passed stones - no surgery required   Hyperlipidemia with target low density lipoprotein (LDL) cholesterol less than 70 mg/dL 46/96/2952   Impaired fasting glucose    Knee pain    bilateral   Left main coronary artery disease 03/18/2017   Cardiac Cath: 55% dLM, tandem p-mLAD 75% --> referred for CABG.   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 Laneta Simmers @ [facility]):  LIMA-LAD, SVG-D2, SVG-OM, SVG-PDA (endoscopic SVG harvesting from right leg).   Shoulder pain    Wears hearing aid     Past Surgical History:  Procedure Laterality Date   APPENDECTOMY     2020   BUBBLE STUDY  08/05/2019   Procedure: BUBBLE STUDY;  Surgeon: Jodelle Red, MD;  Location: [facility] ENDOSCOPY;  Service: Cardiovascular;;   Cardiac Stress Test  2008   Normal   CARPAL TUNNEL RELEASE Left 1998   CARPAL TUNNEL RELEASE Right 2008   CHOLECYSTECTOMY, LAPAROSCOPIC  2021   in [place]   COLONOSCOPY  06/2013   Marina Goodell - polyps   CORONARY ARTERY BYPASS GRAFT N/A 04/04/2017   Procedure: CORONARY ARTERY BYPASS GRAFTING x 4  LIMA-LAD, SVG-D2, SVG-OM, SVG-PDA (endoscopic SVG harvesting from right leg);  Surgeon: Alleen Borne, MD;  Location: [facility] OR;  Service: Open Heart Surgery;  Laterality: N/A;   EXTRACORPOREAL SHOCK WAVE LITHOTRIPSY Right 10/23/2018   Procedure: EXTRACORPOREAL SHOCK WAVE LITHOTRIPSY (ESWL);  Surgeon: Alfredo Martinez, MD;  Location: [facility];  Service: Urology;  Laterality: Right;   HERNIA REPAIR Left     inguinal   HIATAL HERNIA REPAIR     no surgery per pt   INCISION AND DRAINAGE WOUND WITH NAILBED REPAIR Left 05/28/2022   Procedure: Irrigation and debridement and open repair bone nail plate nailbed and soft tissue structures;  Surgeon: Dominica Severin, MD;  Location: [facility] OR;  Service: Orthopedics;  Laterality: Left;  1 hr Block with IV Sedation   KNEE CARTILAGE SURGERY Bilateral    LEFT HEART CATH AND CORONARY ANGIOGRAPHY N/A 03/18/2017   Procedure: Left Heart Cath and Coronary Angiography;  Surgeon: Marykay Lex, MD;  Location: [facility] INVASIVE CV LAB;  Service: Cardiovascular:  55% dLM, p-mLAD 75% - mLAD 75%.  EF 55-65%. Mod non-obstructive RCA disease --> referred for CABG   LOOP RECORDER INSERTION N/A 08/05/2019   Procedure: LOOP RECORDER INSERTION;  Surgeon: Regan Lemming, MD;  Location: [facility] INVASIVE CV LAB;  Service: Cardiovascular;  Laterality: N/A;   MEDIAL PARTIAL KNEE REPLACEMENT Right 2010   Partial right knee replacement   NM MYOVIEW LTD  01/2017   INTERMEDIATE RISK: Exercise tolerance was good at 10 minutes, however, fatigue and dyspnea were reporte-d -> hypertensive response to exercise.  3mm Horizontal ST depressions noted during stress in the II, III, aVF, V6, V5 and V4 leads c/w ischemia.   Small siize, moderate severity perfusion defect  mid to distal anterior wall perfusion defect suggestive of ischemia.Marland Kitchen    NM MYOVIEW LTD  05/07/2022   Treadmill Myoview: Average exercise capacity (7:00 min:s; 8.5 METS). Normal HR response to exercise. Hypertensive response to exercise.  EF 55 to 65% (60%).  Normal size and function.  NO EVIDENCE OF ISCHEMIA OR INFARCTION -> NORMAL/LOW RISK   ROTATOR CUFF REPAIR Left 2008   SHOULDER ARTHROSCOPY W/ ROTATOR CUFF REPAIR Right 06/15/2016   TEE WITHOUT CARDIOVERSION N/A 04/04/2017   Procedure: TRANSESOPHAGEAL ECHOCARDIOGRAM (TEE);  Surgeon: Alleen Borne, MD;  Location: [facility] OR;  Service: Open Heart Surgery;  Laterality: N/A;   TEE WITHOUT  CARDIOVERSION N/A 08/05/2019   Procedure: TRANSESOPHAGEAL ECHOCARDIOGRAM (TEE);  Surgeon: Jodelle Red, MD;  Location: [facility] ENDOSCOPY:: EF 60 to 65%, no RWMA.  Normal atria, normal RV.  Normal valves.  No vegetation noted.  No LAA thrombus.  Negative bubble study.  Mild-diffuse aortic atherosclerosis   TONSILLECTOMY     TRANSTHORACIC ECHOCARDIOGRAM  08/20/2021   a) 05/21/2019: EF 60 to 65%.  No LVH.  Indeterminate LV filling.  No RWMA.  Normal RV.  Normal atria.  Normal aortic and mitral valves other than mild aortic sclerosis.  Normal RVSP and RAP.;; b) 08/20/2021:    MEDICATIONS:  Ascorbic Acid (VITAMIN C) 1000 MG tablet   aspirin EC 81 MG tablet   B Complex Vitamins (B COMPLEX 1 PO)   Cholecalciferol (VITAMIN D3 MAXIMUM STRENGTH) 125 MCG (5000 UT) capsule   CHOLINE PO   cyanocobalamin (VITAMIN B12) 1000 MCG tablet   EPINEPHrine 0.3 mg/0.3 mL IJ SOAJ injection   ezetimibe (ZETIA) 10 MG tablet   FIBER PO   MAGNESIUM GLYCINATE PO   Nutritional Supplements (KETO PO)   OVER THE COUNTER MEDICATION   pantoprazole (PROTONIX) 40 MG tablet   pravastatin (PRAVACHOL) 20 MG tablet   Probiotic Product (ALIGN) 4 MG CAPS   tadalafil (ADCIRCA/CIALIS) 20 MG tablet   Testosterone 25 MG/2.5GM (1%) GEL   traMADol (ULTRAM) 50 MG tablet   valsartan (DIOVAN) 80 MG tablet   Zinc 30 MG TABS   No current facility-administered medications for this encounter.   Marcille Blanco [facility] Short Stay/Anesthesiology [facility] Phone [phone] 10/22/2023 9:40 AM

## 2023-10-22 NOTE — Anesthesia Preprocedure Evaluation (Signed)
 Anesthesia Evaluation  Patient identified by MRN, date of birth, ID band Patient awake    Reviewed: Allergy & Precautions, NPO status , Patient's Chart, lab work & pertinent test results  History of Anesthesia Complications Negative for: history of anesthetic complications  Airway Mallampati: II  TM Distance: >3 FB Neck ROM: Full    Dental no notable dental hx. (+) Teeth Intact, Dental Advisory Given   Pulmonary neg pulmonary ROS   Pulmonary exam normal breath sounds clear to auscultation       Cardiovascular hypertension, (-) angina + CABG  (-) CAD and (-) Past MI Normal cardiovascular exam Rhythm:Regular Rate:Normal     Neuro/Psych  Headaches TIA   GI/Hepatic hiatal hernia,GERD  ,,  Endo/Other    Renal/GU Lab Results      Component                Value               Date                            K                        4.1                 10/17/2023                 CREATININE               0.83                10/17/2023                GFRNONAA                 >60                 10/17/2023                CALCIUM                  8.4 (L)             10/17/2023                   Musculoskeletal  (+) Arthritis ,    Abdominal   Peds  Hematology Lab Results      Component                Value               Date                      WBC                      9.1                 10/17/2023                HGB                      11.7 (L)            10/17/2023                HCT  35.5 (L)            10/17/2023                MCV                      93.2                10/17/2023                PLT                      175                 10/17/2023              Anesthesia Other Findings All: see list  Reproductive/Obstetrics                             Anesthesia Physical Anesthesia Plan  ASA: 3  Anesthesia Plan: Spinal and Regional   Post-op Pain Management:  Minimal or no pain anticipated and Regional block*   Induction: Intravenous  PONV Risk Score and Plan: Treatment may vary due to age or medical condition and Ondansetron  Airway Management Planned:   Additional Equipment: None  Intra-op Plan:   Post-operative Plan:   Informed Consent: I have reviewed the patients History and Physical, chart, labs and discussed the procedure including the risks, benefits and alternatives for the proposed anesthesia with the patient or authorized representative who has indicated his/her understanding and acceptance.     Dental advisory given  Plan Discussed with: CRNA and Surgeon  Anesthesia Plan Comments: (See PAT note from 2/27 by Sherlie Ban PA-C )        Anesthesia Quick Evaluation

## 2023-10-22 NOTE — H&P (Signed)
 PARTIAL KNEE SDD H&P  Patient is being admitted for left partial knee arthroplasty.  Therapy Plans: outpatient therapy at [facility] Disposition: Home with wife at [facility] Planned DVT Prophylaxis: aspirin 81mg  BID DME needed: walker PCP: Cardio: Dr. Herbie Baltimore - clearance received TXA: IV Allergies: hydrocodone/oxycodone - nausea / confusion (in the [employer]) Anesthesia Concerns: none BMI: 27 Last HgbA1c: Not diabetic   Other: - prefers minimal pain meds (doesn't recall taking any with last knee surgery) - tramadol, robaxin, tylenol -- all sent ahead - SDD - Best friends with Dr. Nilsa Nutting parents  Subjective:  Chief Complaint:left knee pain.  HPI: Jordan [name], 83 y.o. male, has a history of pain and functional disability in the left knee due to arthritis and has failed non-surgical conservative treatments for greater than 12 weeks to includeNSAID's and/or analgesics, corticosteriod injections, and activity modification.  Onset of symptoms was gradual, starting 2 years ago with gradually worsening course since that time. The patient noted no past surgery on the left knee(s).  Patient currently rates pain in the left knee(s) at 8 out of 10 with activity. Patient has worsening of pain with activity and weight bearing and pain that interferes with activities of daily living.  Patient has evidence of joint space narrowing by imaging studies.  There is no active infection.  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   Left main coronary artery disease 03/18/2017   Abnormal nuclear stress test 03/05/2017   Mediastinal adenopathy 02/26/2017   Essential hypertension 01/29/2017   Dyspnea on exertion 01/28/2017   Hyperlipidemia with target low density lipoprotein (LDL) cholesterol less than 70 mg/dL 16/05/9603   GERD (gastroesophageal reflux  disease) 03/31/2014   ED (erectile dysfunction) 03/31/2014   Carotid stenosis, asymptomatic 07/27/2011   Past Medical History:  Diagnosis Date   Carotid stenosis, asymptomatic 07/27/2011   Cryptogenic stroke (HCC) 04/2019   Minimal carotid disease, normal TTE/TEE; LINQ ILR Placed -> no documented A-fib   DDD (degenerative disc disease)    ED (erectile dysfunction)    Essential hypertension 01/29/2017   GERD (gastroesophageal reflux disease)    History of hiatal hernia    History of kidney stones    passed stones - no surgery required   Hyperlipidemia with target low density lipoprotein (LDL) cholesterol less than 70 mg/dL 54/04/8118   Impaired fasting glucose    Knee pain    bilateral   Left main coronary artery disease 03/18/2017   Cardiac Cath: 55% dLM, tandem p-mLAD 75% --> referred for CABG.   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 Laneta Simmers @ [facility]):  LIMA-LAD, SVG-D2, SVG-OM, SVG-PDA (endoscopic SVG harvesting from right leg).   Shoulder pain    Wears hearing aid     Past Surgical History:  Procedure Laterality Date   APPENDECTOMY     2020   BUBBLE STUDY  08/05/2019   Procedure: BUBBLE STUDY;  Surgeon: Jodelle Red, MD;  Location: [facility] ENDOSCOPY;  Service: Cardiovascular;;   Cardiac Stress Test  2008   Normal   CARPAL TUNNEL RELEASE Left 1998   CARPAL TUNNEL RELEASE Right 2008   CHOLECYSTECTOMY, LAPAROSCOPIC  2021  in [place]   COLONOSCOPY  06/2013   Marina Goodell - polyps   CORONARY ARTERY BYPASS GRAFT N/A 04/04/2017   Procedure: CORONARY ARTERY BYPASS GRAFTING x 4  LIMA-LAD, SVG-D2, SVG-OM, SVG-PDA (endoscopic SVG harvesting from right leg);  Surgeon: Alleen Borne, MD;  Location: [facility] OR;  Service: Open Heart Surgery;  Laterality: N/A;   EXTRACORPOREAL SHOCK WAVE LITHOTRIPSY Right 10/23/2018    Procedure: EXTRACORPOREAL SHOCK WAVE LITHOTRIPSY (ESWL);  Surgeon: Alfredo Martinez, MD;  Location: [facility] ORS;  Service: Urology;  Laterality: Right;   HERNIA REPAIR Left    inguinal   HIATAL HERNIA REPAIR     no surgery per pt   INCISION AND DRAINAGE WOUND WITH NAILBED REPAIR Left 05/28/2022   Procedure: Irrigation and debridement and open repair bone nail plate nailbed and soft tissue structures;  Surgeon: Dominica Severin, MD;  Location: [facility] OR;  Service: Orthopedics;  Laterality: Left;  1 hr Block with IV Sedation   KNEE CARTILAGE SURGERY Bilateral    LEFT HEART CATH AND CORONARY ANGIOGRAPHY N/A 03/18/2017   Procedure: Left Heart Cath and Coronary Angiography;  Surgeon: Marykay Lex, MD;  Location: [facility] INVASIVE CV LAB;  Service: Cardiovascular:  55% dLM, p-mLAD 75% - mLAD 75%.  EF 55-65%. Mod non-obstructive RCA disease --> referred for CABG   LOOP RECORDER INSERTION N/A 08/05/2019   Procedure: LOOP RECORDER INSERTION;  Surgeon: Regan Lemming, MD;  Location: [facility] INVASIVE CV LAB;  Service: Cardiovascular;  Laterality: N/A;   MEDIAL PARTIAL KNEE REPLACEMENT Right 2010   Partial right knee replacement   NM MYOVIEW LTD  01/2017   INTERMEDIATE RISK: Exercise tolerance was good at 10 minutes, however, fatigue and dyspnea were reporte-d -> hypertensive response to exercise.  3mm Horizontal ST depressions noted during stress in the II, III, aVF, V6, V5 and V4 leads c/w ischemia.   Small siize, moderate severity perfusion defect  mid to distal anterior wall perfusion defect suggestive of ischemia.Marland Kitchen    NM MYOVIEW LTD  05/07/2022   Treadmill Myoview: Average exercise capacity (7:00 min:s; 8.5 METS). Normal HR response to exercise. Hypertensive response to exercise.  EF 55 to 65% (60%).  Normal size and function.  NO EVIDENCE OF ISCHEMIA OR INFARCTION -> NORMAL/LOW RISK   ROTATOR CUFF REPAIR Left 2008   SHOULDER ARTHROSCOPY W/ ROTATOR CUFF REPAIR Right 06/15/2016   TEE WITHOUT CARDIOVERSION N/A  04/04/2017   Procedure: TRANSESOPHAGEAL ECHOCARDIOGRAM (TEE);  Surgeon: Alleen Borne, MD;  Location: [facility] OR;  Service: Open Heart Surgery;  Laterality: N/A;   TEE WITHOUT CARDIOVERSION N/A 08/05/2019   Procedure: TRANSESOPHAGEAL ECHOCARDIOGRAM (TEE);  Surgeon: Jodelle Red, MD;  Location: [facility] ENDOSCOPY:: EF 60 to 65%, no RWMA.  Normal atria, normal RV.  Normal valves.  No vegetation noted.  No LAA thrombus.  Negative bubble study.  Mild-diffuse aortic atherosclerosis   TONSILLECTOMY     TRANSTHORACIC ECHOCARDIOGRAM  08/20/2021   a) 05/21/2019: EF 60 to 65%.  No LVH.  Indeterminate LV filling.  No RWMA.  Normal RV.  Normal atria.  Normal aortic and mitral valves other than mild aortic sclerosis.  Normal RVSP and RAP.;; b) 08/20/2021:    No current facility-administered medications for this encounter.   Current Outpatient Medications  Medication Sig Dispense Refill Last Dose/Taking   Ascorbic Acid (VITAMIN C) 1000 MG tablet Take 1,000 mg by mouth 2 (two) times daily.   Taking   aspirin EC 81 MG tablet Take 81 mg by mouth at bedtime.   Taking  B Complex Vitamins (B COMPLEX 1 PO) Take 1 capsule by mouth daily.   Taking   Cholecalciferol (VITAMIN D3 MAXIMUM STRENGTH) 125 MCG (5000 UT) capsule Take 5,000 Units by mouth at bedtime.   Taking   CHOLINE PO Take 800 mg by mouth 2 (two) times daily.   Taking   cyanocobalamin (VITAMIN B12) 1000 MCG tablet Take 1,000 mcg by mouth at bedtime.   Taking   EPINEPHrine 0.3 mg/0.3 mL IJ SOAJ injection Inject 0.3 mg into the muscle as needed for anaphylaxis.   Taking As Needed   ezetimibe (ZETIA) 10 MG tablet Take 10 mg by mouth daily.    Taking   FIBER PO Take 1 tablet by mouth 2 (two) times daily.   Taking   MAGNESIUM GLYCINATE PO Take 1 tablet by mouth at bedtime.   Taking   Nutritional Supplements (KETO PO) Take 1 capsule by mouth daily. Medication is called Keto DHEA 7   Taking   OVER THE COUNTER MEDICATION Take 1 tablet by mouth 2 (two) times daily.  Total Restor supplement   Taking   pantoprazole (PROTONIX) 40 MG tablet Take 40 mg by mouth daily.   Taking   pravastatin (PRAVACHOL) 20 MG tablet Take 20 mg by mouth at bedtime.   Taking   Probiotic Product (ALIGN) 4 MG CAPS Take 4 mg by mouth at bedtime.   Taking   tadalafil (ADCIRCA/CIALIS) 20 MG tablet Take 20 mg by mouth at bedtime.   Taking   Testosterone 25 MG/2.5GM (1%) GEL 1 mL by Other route See admin instructions. APPLY 4 CLICKS (1 ML) TO ALTERNATING SHOULDERS DAILY   Taking   valsartan (DIOVAN) 80 MG tablet Take 40 mg by mouth 2 (two) times daily.   Taking   Zinc 30 MG TABS Take 1 tablet by mouth daily.   Taking   traMADol (ULTRAM) 50 MG tablet Take 50-100 mg by mouth every 6 (six) hours as needed for moderate pain (pain score 4-6).      Allergies  Allergen Reactions   Aspirin     excessive bleeding   Caffeine     Avoids due to Meniere's disease   Hydrocodone     Unknown reaction   Nexium [Esomeprazole]     Unknown reaction   Other     Red wine - Avoids due to meniere's disease   Oxycodone     Unknown reaction    Crestor [Rosuvastatin Calcium] Other (See Comments)    Muscle aches     Social History   Tobacco Use   Smoking status: Never   Smokeless tobacco: Never  Substance Use Topics   Alcohol use: Yes    Alcohol/week: 7.0 standard drinks of alcohol    Types: 7 Glasses of wine per week    Family History  Problem Relation Age of Onset   Heart failure Mother    Cancer Mother        breast   Coronary artery disease Father        CABG   Heart attack Father        Long-term smoker.   Hyperlipidemia Father    Hyperlipidemia Brother    Hypertension Brother    Hyperlipidemia Son    Healthy Daughter    Colon cancer Neg Hx    Colon polyps Neg Hx    Rectal cancer Neg Hx    Stomach cancer Neg Hx      Review of Systems  Constitutional:  Negative for chills and fever.  Respiratory:  Negative for cough and shortness of breath.   Cardiovascular:  Negative for  chest pain.  Gastrointestinal:  Negative for nausea and vomiting.  Musculoskeletal:  Positive for arthralgias.     Objective:  Physical Exam Well nourished and well developed. General: Alert and oriented x3, cooperative and pleasant, no acute distress.  Musculoskeletal: Left knee exam: No significant palpable effusion Tenderness medially Palpable osteophytes medially Slight flexion contracture with flexion over 110 degrees  Calves soft and nontender. Motor function intact in LE. Strength 5/5 LE bilaterally. Neuro: Distal pulses 2+. Sensation to light touch intact in LE.  Vital signs in last 24 hours:    Labs:   Estimated body mass index is 28.31 kg/m as calculated from the following:   Height as of 10/17/23: 5\' 6"  (1.676 m).   Weight as of 10/17/23: 79.6 kg.   Imaging Review Plain radiographs demonstrate severe degenerative joint disease of the medial compartment of the left knee(s). The overall alignment ismild varus. The bone quality appears to be adequate for age and reported activity level.      Assessment/Plan:  End stage arthritis, left knee   The patient history, physical examination, clinical judgment of the provider and imaging studies are consistent with end stage degenerative joint disease of the left knee(s) and total knee arthroplasty is deemed medically necessary. The treatment options including medical management, injection therapy arthroscopy and arthroplasty were discussed at length. The risks and benefits of total knee arthroplasty were presented and reviewed. The risks due to aseptic loosening, infection, stiffness, patella tracking problems, thromboembolic complications and other imponderables were discussed. The patient acknowledged the explanation, agreed to proceed with the plan and consent was signed. Patient is being admitted for inpatient treatment for surgery, pain control, PT, OT, prophylactic antibiotics, VTE prophylaxis, progressive ambulation  and ADL's and discharge planning. The patient is planning to be discharged  hoem.     Patient's anticipated LOS is less than 2 midnights, meeting these requirements: - Younger than 54 - Lives within 1 hour of care - Has a competent adult at home to recover with post-op recover - NO history of  - Chronic pain requiring opiods  - Diabetes  - Coronary Artery Disease  - Heart failure  - Heart attack  - Stroke  - DVT/VTE  - Cardiac arrhythmia  - Respiratory Failure/COPD  - Renal failure  - Anemia  - Advanced Liver disease  Rosalene Billings, PA-C Orthopedic Surgery [facility] [place] [phone]

## 2023-10-23 ENCOUNTER — Encounter (HOSPITAL_COMMUNITY): Payer: Self-pay | Admitting: Orthopedic Surgery

## 2023-10-24 ENCOUNTER — Ambulatory Visit (HOSPITAL_COMMUNITY): Payer: Self-pay | Admitting: Physician Assistant

## 2023-10-24 ENCOUNTER — Ambulatory Visit (HOSPITAL_BASED_OUTPATIENT_CLINIC_OR_DEPARTMENT_OTHER): Payer: Self-pay | Admitting: Anesthesiology

## 2023-10-24 ENCOUNTER — Ambulatory Visit (HOSPITAL_COMMUNITY)
Admission: RE | Admit: 2023-10-24 | Discharge: 2023-10-24 | Disposition: A | Discharge status: Home / Self Care | Payer: Medicare Other | Source: Ambulatory Visit | Attending: Orthopedic Surgery | Admitting: Orthopedic Surgery

## 2023-10-24 ENCOUNTER — Other Ambulatory Visit: Payer: Self-pay

## 2023-10-24 ENCOUNTER — Encounter (HOSPITAL_COMMUNITY)
Admission: RE | Disposition: A | Discharge status: Home / Self Care | Payer: Self-pay | Source: Ambulatory Visit | Attending: Orthopedic Surgery

## 2023-10-24 ENCOUNTER — Encounter (HOSPITAL_COMMUNITY): Payer: Self-pay | Admitting: Orthopedic Surgery

## 2023-10-24 DIAGNOSIS — Z8673 Personal history of transient ischemic attack (TIA), and cerebral infarction without residual deficits: Secondary | ICD-10-CM | POA: Insufficient documentation

## 2023-10-24 DIAGNOSIS — K219 Gastro-esophageal reflux disease without esophagitis: Secondary | ICD-10-CM | POA: Diagnosis not present

## 2023-10-24 DIAGNOSIS — Z8249 Family history of ischemic heart disease and other diseases of the circulatory system: Secondary | ICD-10-CM | POA: Diagnosis not present

## 2023-10-24 DIAGNOSIS — E785 Hyperlipidemia, unspecified: Secondary | ICD-10-CM

## 2023-10-24 DIAGNOSIS — Z951 Presence of aortocoronary bypass graft: Secondary | ICD-10-CM | POA: Diagnosis not present

## 2023-10-24 DIAGNOSIS — M1712 Unilateral primary osteoarthritis, left knee: Secondary | ICD-10-CM | POA: Insufficient documentation

## 2023-10-24 DIAGNOSIS — I10 Essential (primary) hypertension: Secondary | ICD-10-CM

## 2023-10-24 DIAGNOSIS — I2510 Atherosclerotic heart disease of native coronary artery without angina pectoris: Secondary | ICD-10-CM | POA: Diagnosis not present

## 2023-10-24 DIAGNOSIS — H8109 Meniere's disease, unspecified ear: Secondary | ICD-10-CM | POA: Insufficient documentation

## 2023-10-24 DIAGNOSIS — Z96651 Presence of right artificial knee joint: Secondary | ICD-10-CM | POA: Diagnosis not present

## 2023-10-24 DIAGNOSIS — G8918 Other acute postprocedural pain: Secondary | ICD-10-CM | POA: Diagnosis not present

## 2023-10-24 SURGERY — ARTHROPLASTY, KNEE, UNICOMPARTMENTAL
Anesthesia: Regional | Site: Knee | Laterality: Left

## 2023-10-24 MED ORDER — PROPOFOL 500 MG/50ML IV EMUL
INTRAVENOUS | Status: DC | PRN
  Administered 2023-10-24: 75 ug/kg/min via INTRAVENOUS

## 2023-10-24 MED ORDER — ONDANSETRON HCL 4 MG/2ML IJ SOLN: 4.0000 mg | Freq: Once | INTRAMUSCULAR | Status: DC | PRN

## 2023-10-24 MED ORDER — CLONIDINE HCL (ANALGESIA) 100 MCG/ML EP SOLN
EPIDURAL | Status: DC | PRN
  Administered 2023-10-24: 100 ug

## 2023-10-24 MED ORDER — ONDANSETRON HCL 4 MG/2ML IJ SOLN: 4.0000 mg | Freq: Four times a day (QID) | INTRAMUSCULAR | Status: DC | PRN

## 2023-10-24 MED ORDER — LACTATED RINGERS IV BOLUS
250.0000 mL | Freq: Once | INTRAVENOUS | Status: AC
  Administered 2023-10-24: 250 mL via INTRAVENOUS

## 2023-10-24 MED ORDER — CHLORHEXIDINE GLUCONATE 0.12 % MT SOLN
15.0000 mL | Freq: Once | OROMUCOSAL | Status: AC
  Administered 2023-10-24: 15 mL via OROMUCOSAL

## 2023-10-24 MED ORDER — 0.9 % SODIUM CHLORIDE (POUR BTL) OPTIME
TOPICAL | Status: DC | PRN
  Administered 2023-10-24: 1000 mL

## 2023-10-24 MED ORDER — PROPOFOL 1000 MG/100ML IV EMUL
INTRAVENOUS | Status: AC
  Filled 2023-10-24: qty 100

## 2023-10-24 MED ORDER — FENTANYL CITRATE (PF) 100 MCG/2ML IJ SOLN
INTRAMUSCULAR | Status: DC | PRN
  Administered 2023-10-24: 50 ug via INTRAVENOUS

## 2023-10-24 MED ORDER — ROPIVACAINE HCL 5 MG/ML IJ SOLN
INTRAMUSCULAR | Status: DC | PRN
  Administered 2023-10-24: 30 mL via PERINEURAL

## 2023-10-24 MED ORDER — CEFAZOLIN SODIUM-DEXTROSE 2-4 GM/100ML-% IV SOLN
2.0000 g | INTRAVENOUS | Status: AC
  Administered 2023-10-24: 2 g via INTRAVENOUS
  Filled 2023-10-24: qty 100

## 2023-10-24 MED ORDER — POVIDONE-IODINE 10 % EX SWAB: 2.0000 | Freq: Once | CUTANEOUS | Status: DC

## 2023-10-24 MED ORDER — BUPIVACAINE IN DEXTROSE 0.75-8.25 % IT SOLN
INTRATHECAL | Status: DC | PRN
  Administered 2023-10-24: 12 mg via INTRATHECAL

## 2023-10-24 MED ORDER — TRANEXAMIC ACID-NACL 1000-0.7 MG/100ML-% IV SOLN: 1000.0000 mg | Freq: Once | INTRAVENOUS | Status: DC

## 2023-10-24 MED ORDER — FENTANYL CITRATE PF 50 MCG/ML IJ SOSY
50.0000 ug | PREFILLED_SYRINGE | INTRAMUSCULAR | Status: DC
  Filled 2023-10-24: qty 1

## 2023-10-24 MED ORDER — LACTATED RINGERS IV SOLN: INTRAVENOUS | Status: DC | PRN

## 2023-10-24 MED ORDER — PROPOFOL 10 MG/ML IV BOLUS
INTRAVENOUS | Status: AC
  Filled 2023-10-24: qty 20

## 2023-10-24 MED ORDER — LIDOCAINE 2% (20 MG/ML) 5 ML SYRINGE
INTRAMUSCULAR | Status: DC | PRN
  Administered 2023-10-24: 60 mg via INTRAVENOUS

## 2023-10-24 MED ORDER — KETOROLAC TROMETHAMINE 30 MG/ML IJ SOLN
INTRAMUSCULAR | Status: AC
  Filled 2023-10-24: qty 1

## 2023-10-24 MED ORDER — STERILE WATER FOR IRRIGATION IR SOLN
Status: DC | PRN
  Administered 2023-10-24: 1000 mL

## 2023-10-24 MED ORDER — LACTATED RINGERS IV BOLUS
500.0000 mL | Freq: Once | INTRAVENOUS | Status: AC
  Administered 2023-10-24: 500 mL via INTRAVENOUS

## 2023-10-24 MED ORDER — SODIUM CHLORIDE (PF) 0.9 % IJ SOLN
INTRAMUSCULAR | Status: AC
  Filled 2023-10-24: qty 30

## 2023-10-24 MED ORDER — ACETAMINOPHEN 500 MG PO TABS: 1000.0000 mg | ORAL_TABLET | Freq: Four times a day (QID) | ORAL | Status: DC

## 2023-10-24 MED ORDER — DEXAMETHASONE SODIUM PHOSPHATE 10 MG/ML IJ SOLN
INTRAMUSCULAR | Status: AC
  Filled 2023-10-24: qty 1

## 2023-10-24 MED ORDER — LIDOCAINE HCL (PF) 2 % IJ SOLN
INTRAMUSCULAR | Status: AC
  Filled 2023-10-24: qty 5

## 2023-10-24 MED ORDER — SODIUM CHLORIDE 0.9% FLUSH: 3.0000 mL | Freq: Two times a day (BID) | INTRAVENOUS | Status: DC

## 2023-10-24 MED ORDER — PROPOFOL 10 MG/ML IV BOLUS
INTRAVENOUS | Status: DC | PRN
  Administered 2023-10-24 (×2): 20 mg via INTRAVENOUS

## 2023-10-24 MED ORDER — SODIUM CHLORIDE (PF) 0.9 % IJ SOLN
INTRAMUSCULAR | Status: DC | PRN
  Administered 2023-10-24: 61 mL

## 2023-10-24 MED ORDER — METHOCARBAMOL 500 MG PO TABS: 500.0000 mg | ORAL_TABLET | Freq: Four times a day (QID) | ORAL | Status: DC | PRN

## 2023-10-24 MED ORDER — TRANEXAMIC ACID-NACL 1000-0.7 MG/100ML-% IV SOLN
1000.0000 mg | INTRAVENOUS | Status: AC
  Administered 2023-10-24: 1000 mg via INTRAVENOUS
  Filled 2023-10-24: qty 100

## 2023-10-24 MED ORDER — PHENYLEPHRINE HCL-NACL 20-0.9 MG/250ML-% IV SOLN
INTRAVENOUS | Status: DC | PRN
  Administered 2023-10-24: 25 ug/min via INTRAVENOUS

## 2023-10-24 MED ORDER — ONDANSETRON HCL 4 MG PO TABS: 4.0000 mg | ORAL_TABLET | Freq: Four times a day (QID) | ORAL | Status: DC | PRN

## 2023-10-24 MED ORDER — SODIUM CHLORIDE 0.9% FLUSH
3.0000 mL | INTRAVENOUS | Status: DC | PRN
Start: 2023-10-24 — End: 2023-10-24

## 2023-10-24 MED ORDER — FENTANYL CITRATE PF 50 MCG/ML IJ SOSY: 25.0000 ug | PREFILLED_SYRINGE | INTRAMUSCULAR | Status: DC | PRN

## 2023-10-24 MED ORDER — METOCLOPRAMIDE HCL 5 MG/ML IJ SOLN: 5.0000 mg | Freq: Three times a day (TID) | INTRAMUSCULAR | Status: DC | PRN

## 2023-10-24 MED ORDER — ONDANSETRON HCL 4 MG/2ML IJ SOLN
INTRAMUSCULAR | Status: DC | PRN
  Administered 2023-10-24: 4 mg via INTRAVENOUS

## 2023-10-24 MED ORDER — CEFAZOLIN SODIUM-DEXTROSE 2-4 GM/100ML-% IV SOLN: 2.0000 g | Freq: Four times a day (QID) | INTRAVENOUS | Status: DC

## 2023-10-24 MED ORDER — METOCLOPRAMIDE HCL 5 MG PO TABS: 5.0000 mg | ORAL_TABLET | Freq: Three times a day (TID) | ORAL | Status: DC | PRN

## 2023-10-24 MED ORDER — BUPIVACAINE-EPINEPHRINE (PF) 0.25% -1:200000 IJ SOLN
INTRAMUSCULAR | Status: AC
  Filled 2023-10-24: qty 30

## 2023-10-24 MED ORDER — METHOCARBAMOL 1000 MG/10ML IJ SOLN: 500.0000 mg | Freq: Four times a day (QID) | INTRAMUSCULAR | Status: DC | PRN

## 2023-10-24 MED ORDER — ONDANSETRON HCL 4 MG/2ML IJ SOLN
INTRAMUSCULAR | Status: AC
  Filled 2023-10-24: qty 2

## 2023-10-24 MED ORDER — DEXAMETHASONE SODIUM PHOSPHATE 10 MG/ML IJ SOLN
8.0000 mg | Freq: Once | INTRAMUSCULAR | Status: AC
  Administered 2023-10-24: 10 mg via INTRAVENOUS

## 2023-10-24 MED ORDER — ORAL CARE MOUTH RINSE: 15.0000 mL | Freq: Once | OROMUCOSAL | Status: AC

## 2023-10-24 MED ORDER — TRAMADOL HCL 50 MG PO TABS: 50.0000 mg | ORAL_TABLET | Freq: Four times a day (QID) | ORAL | Status: DC

## 2023-10-24 MED ORDER — ACETAMINOPHEN 10 MG/ML IV SOLN: 1000.0000 mg | Freq: Once | INTRAVENOUS | Status: DC | PRN

## 2023-10-24 SURGICAL SUPPLY — 40 items
BAG COUNTER SPONGE SURGICOUNT (BAG) IMPLANT
BAG ZIPLOCK 12X15 (MISCELLANEOUS) IMPLANT
BLADE SAW RECIPROCATING 77.5 (BLADE) ×1 IMPLANT
BLADE SAW SGTL 13.0X1.19X90.0M (BLADE) ×1 IMPLANT
BNDG ELASTIC 6INX 5YD STR LF (GAUZE/BANDAGES/DRESSINGS) ×1 IMPLANT
BOWL SMART MIX CTS (DISPOSABLE) ×1 IMPLANT
CEMENT BONE R 1X40 (Cement) ×1 IMPLANT
COVER SURGICAL LIGHT HANDLE (MISCELLANEOUS) ×1 IMPLANT
CUFF TRNQT CYL 34X4.125X (TOURNIQUET CUFF) ×1 IMPLANT
DERMABOND ADVANCED .7 DNX12 (GAUZE/BANDAGES/DRESSINGS) ×1 IMPLANT
DRAPE U-SHAPE 47X51 STRL (DRAPES) ×1 IMPLANT
DRESSING AQUACEL AG SP 3.5X10 (GAUZE/BANDAGES/DRESSINGS) ×1 IMPLANT
DRSG AQUACEL AG SP 3.5X10 (GAUZE/BANDAGES/DRESSINGS) ×1 IMPLANT
DURAPREP 26ML APPLICATOR (WOUND CARE) ×1 IMPLANT
ELECT REM PT RETURN 15FT ADLT (MISCELLANEOUS) ×1 IMPLANT
GLOVE BIO SURGEON STRL SZ 6 (GLOVE) ×1 IMPLANT
GLOVE BIOGEL PI IND STRL 6.5 (GLOVE) ×1 IMPLANT
GLOVE BIOGEL PI IND STRL 7.5 (GLOVE) ×1 IMPLANT
GLOVE ORTHO TXT STRL SZ7.5 (GLOVE) ×2 IMPLANT
GOWN STRL REUS W/ TWL LRG LVL3 (GOWN DISPOSABLE) ×2 IMPLANT
HOLDER FOLEY CATH W/STRAP (MISCELLANEOUS) IMPLANT
INSERT TIB KNEE PS MED 9 D LT (Insert) IMPLANT
KIT TURNOVER KIT A (KITS) IMPLANT
MANIFOLD NEPTUNE II (INSTRUMENTS) ×1 IMPLANT
NDL SAFETY ECLIPSE 18X1.5 (NEEDLE) ×1 IMPLANT
PACK TOTAL KNEE CUSTOM (KITS) ×1 IMPLANT
PIN DRILL HDLS TROCAR 75 4PK (PIN) IMPLANT
SCREW HEADED 33MM KNEE (MISCELLANEOUS) IMPLANT
SCREW HEADED 48MM KNEE (MISCELLANEOUS) IMPLANT
SET PAD KNEE POSITIONER (MISCELLANEOUS) ×1 IMPLANT
SPIKE FLUID TRANSFER (MISCELLANEOUS) ×2 IMPLANT
SUT MNCRL AB 4-0 PS2 18 (SUTURE) ×1 IMPLANT
SUT STRATAFIX PDS+ 0 24IN (SUTURE) ×1 IMPLANT
SUT VIC AB 1 CT1 36 (SUTURE) ×1 IMPLANT
SUT VIC AB 2-0 CT1 TAPERPNT 27 (SUTURE) ×2 IMPLANT
SYR 3ML LL SCALE MARK (SYRINGE) ×1 IMPLANT
SYSTEM KNEE FEMUR SZ 3 LT (Orthopedic Implant) IMPLANT
SYSTEM KNEE TIBIAL SZ D LT (Orthopedic Implant) IMPLANT
TRAY FOLEY MTR SLVR 16FR STAT (SET/KITS/TRAYS/PACK) ×1 IMPLANT
WRAP KNEE MAXI GEL POST OP (GAUZE/BANDAGES/DRESSINGS) ×1 IMPLANT

## 2023-10-24 NOTE — Anesthesia Procedure Notes (Signed)
 Spinal  Patient location during procedure: OR Start time: 10/24/2023 9:14 AM End time: 10/24/2023 9:18 AM Reason for block: surgical anesthesia Staffing Performed: anesthesiologist  Anesthesiologist: Trevor Iha, MD Performed by: Trevor Iha, MD Authorized by: Trevor Iha, MD   Preanesthetic Checklist Completed: patient identified, IV checked, risks and benefits discussed, surgical consent, monitors and equipment checked, pre-op evaluation and timeout performed Spinal Block Patient position: sitting Prep: DuraPrep and site prepped and draped Patient monitoring: heart rate, cardiac monitor, continuous pulse ox and blood pressure Approach: midline Location: L3-4 Injection technique: single-shot Needle Needle type: Pencan  Needle gauge: 24 G Needle length: 10 cm Needle insertion depth: 6 cm Assessment Sensory level: T4 Events: CSF return Additional Notes  1 Attempt (s). Pt tolerated procedure well.

## 2023-10-24 NOTE — Brief Op Note (Signed)
 10/24/2023  9:15 AM  PATIENT:  Jordan [name]  83 y.o. male  PRE-OPERATIVE DIAGNOSIS:  Left knee medial osteoarthritis  POST-OPERATIVE DIAGNOSIS:  Left knee medial osteoarthritis  PROCEDURE:  Procedure(s): ARTHROPLASTY, KNEE, UNICOMPARTMENTAL (Left)  SURGEON:  Surgeons and Role:    Durene Romans, MD - Primary  ASSISTANTS: surgical team   ANESTHESIA:   regional and spinal  EBL:  <100 cc  BLOOD ADMINISTERED:none  DRAINS: none   LOCAL MEDICATIONS USED:  MARCAINE     SPECIMEN:  No Specimen  DISPOSITION OF SPECIMEN:  N/A  COUNTS:  YES  TOURNIQUET:  33 min at 225 mmHg  DICTATION: .Other Dictation: Dictation Number [id number]  PLAN OF CARE: Discharge to home after PACU  PATIENT DISPOSITION:  PACU - hemodynamically stable.   Delay start of Pharmacological VTE agent (>24hrs) due to surgical blood loss or risk of bleeding: no

## 2023-10-24 NOTE — Discharge Instructions (Signed)

## 2023-10-24 NOTE — Anesthesia Postprocedure Evaluation (Signed)
 Anesthesia Post Note  Patient: Garrison Michie Lieb  Procedure(s) Performed: ARTHROPLASTY, KNEE, UNICOMPARTMENTAL (Left: Knee)     Patient location during evaluation: Nursing Unit Anesthesia Type: Regional Level of consciousness: oriented and awake and alert Pain management: pain level controlled Vital Signs Assessment: post-procedure vital signs reviewed and stable Respiratory status: spontaneous breathing and respiratory function stable Cardiovascular status: blood pressure returned to baseline and stable Postop Assessment: no headache, no backache, no apparent nausea or vomiting and patient able to bend at knees Anesthetic complications: no  No notable events documented.  Last Vitals:  Vitals:   10/24/23 1400 10/24/23 1415  BP: 117/60 128/69  Pulse: (!) 48 (!) 53  Resp: 14 14  Temp:    SpO2: 95% 98%    Last Pain:  Vitals:   10/24/23 1345  TempSrc:   PainSc: 0-No pain                 Trevor Iha

## 2023-10-24 NOTE — Interval H&P Note (Signed)
 History and Physical Interval Note:  10/24/2023 9:08 AM  Jordan [name]  has presented today for surgery, with the diagnosis of Left knee  medial osteoarthritis.  The various methods of treatment have been discussed with the patient and family. After consideration of risks, benefits and other options for treatment, the patient has consented to  Procedure(s): ARTHROPLASTY, KNEE, UNICOMPARTMENTAL (Left) as a surgical intervention.  The patient's history has been reviewed, patient examined, no change in status, stable for surgery.  I have reviewed the patient's chart and labs.  Questions were answered to the patient's satisfaction.     Shelda Pal

## 2023-10-24 NOTE — Op Note (Signed)
 NAME: Jordan [name], Jordan [name] MEDICAL RECORD NO: [id number] ACCOUNT NO: [id number] DATE OF BIRTH: 08/15/1941 FACILITY: [facility] LOCATION: [facility] PHYSICIAN: Madlyn Frankel. Charlann Boxer, MD  Operative Report   DATE OF PROCEDURE: 10/24/2023  PREOPERATIVE DIAGNOSIS:  Left knee medial compartment osteoarthritis.  POSTOPERATIVE DIAGNOSIS:  Left knee medial compartment osteoarthritis.  PROCEDURE:  Left knee medial compartment arthroplasty utilizing [employer] fixed bearing knee system with a size 3 left femur, size D left tibial baseplate, and a size 9 mm insert to match the D baseplate.  SURGEON:  Madlyn Frankel. Charlann Boxer, MD  ASSISTANT:  Surgical Team.  ANESTHESIA:  Regional plus spinal.  BLOOD LOSS:  Less than 100 mL.  DRAINS:  None.  COMPLICATIONS:  None.  TOURNIQUET:  Up for 33 minutes at 225 mmHg.  INDICATIONS FOR THE PROCEDURE:  The patient is a very pleasant, healthy 83 year old male who had been seen and evaluated for ongoing challenges with regard to his left knee pain.  His knee pain was mainly medial.  Radiographs had indicated preserved  compartments within the patellofemoral and lateral compartments with moderate narrowing medially.  MRI was ordered based on these findings, but revealed advanced medial compartment osteoarthritis associated with meniscal tear and subchondral edema.   Based on the preservation of the compartments laterally and within the patellofemoral compartment and the predominance of the symptoms medially as well as his history of a right partial knee replacement, we discussed a partial knee replacement versus  total knee replacement.  He was aware of the risks of infection, DVT, component failure, and the potential need for future surgeries.  He is aware of the postoperative course and effort required to maximize his functional return.  Consent was obtained  for the benefit of pain relief.  DESCRIPTION OF PROCEDURE:  The patient was brought to the operative theater.  Once  adequate anesthesia, preoperative antibiotics, Ancef administered.  He was positioned supine with a left thigh tourniquet placed.  The left lower extremity was then  prepped and draped in a sterile fashion.  Note, the tranexamic acid was also prescribed.  A timeout was performed identifying the patient, planned procedure and extremity.  The leg was exsanguinated and tourniquet elevated to 225 mmHg.  A paramidline  incision was made.  Following soft tissue exposure, a medial arthrotomy was made encountering clear synovial fluid.  Following initial exposure, I identified that there was significant pathology medially as anticipated by the MRI with the preserved  patellofemoral and lateral compartments.  Following initial exposure, attention was directed to the patella cup.  Using the extramedullary guide, I resected 2 mm of bone off the proximal medial tibia based on the deformity present.  Following this  resection, we placed a 9 mm spacer in order to make the distal femoral cut.  Following this cut, I sized the femur to be a size 3.  It was pinned into place and drill holes and saw cuts made.  We then sized the tibia to be a size D.  This was pinned into  place and a trial reduction was carried out with a 3 femur and the D tibial base plate in place.  I found that with a 9 mm insert, there was appropriate tension in flexion and extension as assessed per protocol.  At this point, all the trial components  were removed.  I drilled sclerotic bone with drill holes for cement interdigitation.  We injected the posteromedial capsule carefully with 0.25% Marcaine with epinephrine and 1 mL of Toradol and saline.  Following irrigation, cement was mixed.  Final  components were opened.  When the cement was prepared, the final components were cemented into place and the knee was brought to extension with a 9-mm insert with a 2 mm amber stick placed for tension.  Once the cement fully cured, I removed excessive  cement  and selected the 9-mm insert.  It was opened and snapped into place.  Following irrigation, the wound was closed.  The tourniquet was let down after 33 minutes without significant hemostasis required.  The extensor mechanism was reapproximated  using #1 Vicryl and #1 Stratafix suture.  The remainder of the wound was closed with 2-0 Vicryl in a running Monocryl stitch.  The wound was then cleaned, dried, and dressed sterilely using surgical glue and Aquacel dressing.  He was brought to the  recovery room in stable condition, tolerating the procedure well.  Findings were reviewed with the family.  Plan is for discharge to home today.   PUS D: 10/24/2023 10:55:07 am T: 10/24/2023 12:42:00 pm  JOB: [id number]/ [id number]

## 2023-10-24 NOTE — Anesthesia Procedure Notes (Signed)
 Anesthesia Regional Block: Adductor canal block   Pre-Anesthetic Checklist: , timeout performed,  Correct Patient, Correct Site, Correct Laterality,  Correct Procedure, Correct Position, site marked,  Risks and benefits discussed,  Surgical consent,  Pre-op evaluation,  At surgeon's request and post-op pain management  Laterality: Lower and Left  Prep: chloraprep       Needles:  Injection technique: Single-shot  Needle Type: Echogenic Needle     Needle Length: 9cm  Needle Gauge: 22     Additional Needles:   Procedures:,,,, ultrasound used (permanent image in chart),,    Narrative:  Start time: 10/24/2023 7:54 AM End time: 10/24/2023 8:02 AM Injection made incrementally with aspirations every 5 mL.  Performed by: Personally  Anesthesiologist: Trevor Iha, MD  Additional Notes: Block assessed prior to surgery. Pt tolerated procedure well.

## 2023-10-24 NOTE — Evaluation (Signed)
 Physical Therapy Evaluation Patient Details Name: Jordan [name] MRN: [id number] DOB: January 30, 1941 Today's Date: 10/24/2023  History of Present Illness  83 y.o. presented for L UKA 10/24/23. PMH: CVA, CABG, DOE, HTN, meniere's disease.  Clinical Impression  Pt is mobilizing well, he ambulated 180' with RW, no loss of balance. Pt/spouse demonstrate good understanding of HEP. He doesn't have any stairs at his home at [place]. Pt attempted to urinate in the bathroom but was unable to do so, RN aware. He is ready to DC home from a PT standpoint.         If plan is discharge home, recommend the following: Assistance with cooking/housework;Assist for transportation   Can travel by private vehicle        Equipment Recommendations Rolling walker (2 wheels)  Recommendations for Other Services       Functional Status Assessment Patient has had a recent decline in their functional status and demonstrates the ability to make significant improvements in function in a reasonable and predictable amount of time.     Precautions / Restrictions Precautions Precautions: Knee Precaution Booklet Issued: Yes (comment) Recall of Precautions/Restrictions: Intact Precaution/Restrictions Comments: reviewed no pillow under knee Restrictions Weight Bearing Restrictions Per Provider Order: No      Mobility  Bed Mobility Overal bed mobility: Modified Independent             General bed mobility comments: HOB up    Transfers Overall transfer level: Needs assistance Equipment used: Rolling walker (2 wheels) Transfers: Sit to/from Stand Sit to Stand: Supervision           General transfer comment: VCs hand placement    Ambulation/Gait Ambulation/Gait assistance: Supervision Gait Distance (Feet): 180 Feet Assistive device: Rolling walker (2 wheels) Gait Pattern/deviations: Step-through pattern, Decreased stride length Gait velocity: WFL     General Gait Details: steady, no loss  of balance  Stairs            Wheelchair Mobility     Tilt Bed    Modified Rankin (Stroke Patients Only)       Balance Overall balance assessment: Modified Independent                                           Pertinent Vitals/Pain Pain Assessment Pain Assessment: 0-10 Pain Score: 1  Pain Location: L knee Pain Descriptors / Indicators: Sore Pain Intervention(s): Limited activity within patient's tolerance, Monitored during session, Repositioned, Ice applied    Home Living Family/patient expects to be discharged to:: Private residence Living Arrangements: Spouse/significant other Available Help at Discharge: Family   Home Access: Level entry       Home Layout: One level Home Equipment: Standard Walker;Grab bars - tub/shower      Prior Function Prior Level of Function : Independent/Modified Independent             Mobility Comments: walks without AD, a few falls related to uneven ground in the woods ADLs Comments: independent     Extremity/Trunk Assessment   Upper Extremity Assessment Upper Extremity Assessment: Overall WFL for tasks assessed    Lower Extremity Assessment Lower Extremity Assessment: LLE deficits/detail LLE Deficits / Details: SLR at least 3/5, knee AAROM  ~5-100* LLE Sensation: WNL LLE Coordination: WNL    Cervical / Trunk Assessment Cervical / Trunk Assessment: Kyphotic (forward head)  Communication   Communication Communication: No apparent difficulties  Cognition Arousal: Alert Behavior During Therapy: WFL for tasks assessed/performed   PT - Cognitive impairments: No apparent impairments                         Following commands: Intact       Cueing       General Comments      Exercises Total Joint Exercises Ankle Circles/Pumps: AROM, Both, 10 reps, Supine Quad Sets: AROM, Both, 5 reps, Supine Short Arc Quad: AROM, Left, 5 reps, Supine Heel Slides: AAROM, Left, 10 reps,  Supine Hip ABduction/ADduction: AROM, Left, 5 reps, Supine Straight Leg Raises: AROM, Left, 5 reps, Supine Long Arc Quad: AROM, Left, 5 reps, Seated Knee Flexion: AAROM, Left, 10 reps, Seated Goniometric ROM: 5-100* L knee AAROM   Assessment/Plan    PT Assessment All further PT needs can be met in the next venue of care  PT Problem List         PT Treatment Interventions      PT Goals (Current goals can be found in the Care Plan section)  Acute Rehab PT Goals Patient Stated Goal: to be able to walk in the woods PT Goal Formulation: All assessment and education complete, DC therapy    Frequency       Co-evaluation               AM-PAC PT "6 Clicks" Mobility  Outcome Measure Help needed turning from your back to your side while in a flat bed without using bedrails?: None Help needed moving from lying on your back to sitting on the side of a flat bed without using bedrails?: None Help needed moving to and from a bed to a chair (including a wheelchair)?: None Help needed standing up from a chair using your arms (e.g., wheelchair or bedside chair)?: None Help needed to walk in hospital room?: None Help needed climbing 3-5 steps with a railing? : None 6 Click Score: 24    End of Session Equipment Utilized During Treatment: Gait belt Activity Tolerance: Patient tolerated treatment well Patient left: in bed;with call bell/phone within reach;with family/visitor present Nurse Communication: Mobility status      Time: 1610-9604 PT Time Calculation (min) (ACUTE ONLY): 35 min   Charges:   PT Evaluation $PT Eval Moderate Complexity: 1 Mod PT Treatments $Gait Training: 8-22 mins PT General Charges $$ ACUTE PT VISIT: 1 Visit         Tamala Ser PT 10/24/2023  Acute Rehabilitation Services  Office [phone]

## 2023-10-24 NOTE — Transfer of Care (Signed)
 Immediate Anesthesia Transfer of Care Note  Patient: Jordan [name]  Procedure(s) Performed: ARTHROPLASTY, KNEE, UNICOMPARTMENTAL (Left: Knee)  Patient Location: PACU  Anesthesia Type:Spinal  Level of Consciousness: awake, alert , and oriented  Airway & Oxygen Therapy: Patient Spontanous Breathing and Patient connected to T-piece oxygen  Post-op Assessment: Report given to RN and Post -op Vital signs reviewed and stable  Post vital signs: Reviewed and stable  Last Vitals:  Vitals Value Taken Time  BP 104/52 10/24/23 1105  Temp    Pulse 55 10/24/23 1105  Resp 14 10/24/23 1105  SpO2 99 % 10/24/23 1105  Vitals shown include unfiled device data.  Last Pain:  Vitals:   10/24/23 0652  TempSrc:   PainSc: 0-No pain         Complications: No notable events documented.

## 2023-10-25 ENCOUNTER — Encounter (HOSPITAL_COMMUNITY): Payer: Self-pay | Admitting: Orthopedic Surgery

## 2023-10-28 ENCOUNTER — Ambulatory Visit: Payer: Medicare Other

## 2023-10-28 DIAGNOSIS — I639 Cerebral infarction, unspecified: Secondary | ICD-10-CM

## 2023-10-28 LAB — CUP PACEART REMOTE DEVICE CHECK
Date Time Interrogation Session: 20250309230245
Implantable Pulse Generator Implant Date: 20201216

## 2023-10-30 NOTE — Progress Notes (Signed)
 Carelink Summary Report / Loop Recorder

## 2023-11-19 ENCOUNTER — Encounter: Payer: Self-pay | Admitting: Internal Medicine

## 2023-12-02 ENCOUNTER — Ambulatory Visit: Payer: Medicare Other

## 2023-12-02 DIAGNOSIS — I639 Cerebral infarction, unspecified: Secondary | ICD-10-CM

## 2023-12-02 LAB — CUP PACEART REMOTE DEVICE CHECK
Date Time Interrogation Session: 20250413230228
Implantable Pulse Generator Implant Date: 20201216

## 2023-12-16 DIAGNOSIS — Z5189 Encounter for other specified aftercare: Secondary | ICD-10-CM | POA: Diagnosis not present

## 2023-12-16 NOTE — Progress Notes (Signed)
 Carelink Summary Report / Loop Recorder

## 2024-01-06 ENCOUNTER — Ambulatory Visit: Payer: Medicare Other

## 2024-01-06 DIAGNOSIS — L821 Other seborrheic keratosis: Secondary | ICD-10-CM | POA: Diagnosis not present

## 2024-01-06 DIAGNOSIS — I639 Cerebral infarction, unspecified: Secondary | ICD-10-CM | POA: Diagnosis not present

## 2024-01-06 DIAGNOSIS — Z85820 Personal history of malignant melanoma of skin: Secondary | ICD-10-CM | POA: Diagnosis not present

## 2024-01-06 DIAGNOSIS — Z85828 Personal history of other malignant neoplasm of skin: Secondary | ICD-10-CM | POA: Diagnosis not present

## 2024-01-06 DIAGNOSIS — D1801 Hemangioma of skin and subcutaneous tissue: Secondary | ICD-10-CM | POA: Diagnosis not present

## 2024-01-06 DIAGNOSIS — L812 Freckles: Secondary | ICD-10-CM | POA: Diagnosis not present

## 2024-01-06 DIAGNOSIS — D225 Melanocytic nevi of trunk: Secondary | ICD-10-CM | POA: Diagnosis not present

## 2024-01-06 DIAGNOSIS — L570 Actinic keratosis: Secondary | ICD-10-CM | POA: Diagnosis not present

## 2024-01-06 LAB — CUP PACEART REMOTE DEVICE CHECK
Date Time Interrogation Session: 20250518230945
Implantable Pulse Generator Implant Date: 20201216

## 2024-01-08 ENCOUNTER — Ambulatory Visit: Payer: Self-pay | Admitting: Cardiology

## 2024-01-23 NOTE — Progress Notes (Signed)
 Carelink Summary Report / Loop Recorder

## 2024-02-03 ENCOUNTER — Telehealth (HOSPITAL_COMMUNITY): Payer: Self-pay | Admitting: *Deleted

## 2024-02-03 ENCOUNTER — Encounter (HOSPITAL_COMMUNITY): Payer: Self-pay | Admitting: *Deleted

## 2024-02-03 ENCOUNTER — Encounter: Payer: Self-pay | Admitting: Cardiology

## 2024-02-03 ENCOUNTER — Ambulatory Visit: Attending: Cardiology | Admitting: Cardiology

## 2024-02-03 VITALS — BP 120/60 | HR 59 | Ht 66.5 in | Wt 176.6 lb

## 2024-02-03 DIAGNOSIS — I25119 Atherosclerotic heart disease of native coronary artery with unspecified angina pectoris: Secondary | ICD-10-CM | POA: Insufficient documentation

## 2024-02-03 DIAGNOSIS — Z951 Presence of aortocoronary bypass graft: Secondary | ICD-10-CM | POA: Diagnosis not present

## 2024-02-03 DIAGNOSIS — D6861 Antiphospholipid syndrome: Secondary | ICD-10-CM | POA: Insufficient documentation

## 2024-02-03 DIAGNOSIS — R0609 Other forms of dyspnea: Secondary | ICD-10-CM | POA: Insufficient documentation

## 2024-02-03 DIAGNOSIS — E785 Hyperlipidemia, unspecified: Secondary | ICD-10-CM | POA: Insufficient documentation

## 2024-02-03 DIAGNOSIS — I10 Essential (primary) hypertension: Secondary | ICD-10-CM | POA: Diagnosis not present

## 2024-02-03 NOTE — Patient Instructions (Addendum)
 Medication Instructions:   No changes *If you need a refill on your cardiac medications before your next appointment, please call your pharmacy*   Lab Work: Not needed If you have labs (blood work) drawn today and your tests are completely normal, you will receive your results only by: MyChart Message (if you have MyChart) OR A paper copy in the mail If you have any lab test that is abnormal or we need to change your treatment, we will call you to review the results.   Testing/Procedures:  1)Your physician has requested that you have an echocardiogram. Echocardiography is a painless test that uses sound waves to create images of your heart. It provides your doctor with information about the size and shape of your heart and how well your heart's chambers and valves are working. This procedure takes approximately one hour. There are no restrictions for this procedure. Please do NOT wear cologne, perfume, aftershave, or lotions (deodorant is allowed). Please arrive 15 minutes prior to your appointment time.  Please note: We ask at that you not bring children with you during ultrasound (echo/ vascular) testing. Due to room size and safety concerns, children are not allowed in the ultrasound rooms during exams. Our front office staff cannot provide observation of children in our lobby area while testing is being conducted. An adult accompanying a patient to their appointment will only be allowed in the ultrasound room at the discretion of the ultrasound technician under special circumstances. We apologize for any inconvenience.    2) Your doctor has scheduled you for a treadmill  Myocardial Perfusion scan  to obtain information about the blood flow to your heart. The test consists of taking pictures of your heart in two phases: while resting and after a stress test.  The stress test may involve walking on a treadmill, or if you are unable to exercise adequately, you will be given a drug intended to  have a similar effect on the heart to that of exercise.  The test will take approximately 3 to 4  hours to complete.    How to prepare for your test: Do not eat or drink 2 hours prior to your test Do not consume products containing caffeine 12 hours prior to your test (examples: coffee (regular OR decaf), chocolate, sodas, tea) Your doctor may need you to hold certain medications prior to the test.  If so, these are listed below and should not be taken for 24 hours prior to the test.  If not listed below, you may take your medications as normal.  You may resume taking held medications on your normal schedule once the test is complete.   Meds to hold: NONE Do bring a list of your current medications with you.  If you have held any meds in preparation for the test, please bring them, as you may be required to take them once the test is completed. Do wear comfortable clothes and walking shoes.  Do not wear dresses or overalls. Do NOT wear cologne, perfume, aftershave, or fragranced lotions the day of your test (deodorants okay). If these instructions are not followed your test will have to be rescheduled.   A nuclear cardiologist will review your test, prepare a report and send it to your physician.   If you have questions or concerns about your appointment, you can call the Nuclear Cardiology department at [phone]. If you cannot keep your appointment, please provide 48 hours notification to avoid a possible $50.00 charge to your  account.   Please arrive 15 minutes prior to your appointment time for registration and insurance purposes   Follow-Up: At [facility], you and your health needs are our priority.  As part of our continuing mission to provide you with exceptional heart care, we have created designated Provider Care Teams.  These Care Teams include your primary Cardiologist (physician) and Advanced Practice Providers (APPs -  Physician Assistants and Nurse Practitioners) who all  work together to provide you with the care you need, when you need it.     Your next appointment:   Keep appointment on March 03, 2024  The format for your next appointment:   In Person  Provider:   Randene Bustard, MD

## 2024-02-03 NOTE — Progress Notes (Unsigned)
 Cardiology Office Note:  .   Date:  02/05/2024  ID:  Jordan [name], DOB 04/26/1941, MRN [id number] PCP: Aldo Hun, MD  [facility] Providers Cardiologist:  Randene Bustard, MD Electrophysiologist:  Will Cortland Ding, MD     No chief complaint on file.   Patient Profile: .     Jordan [name] is a very healhty appearing 83 y.o. male  with a PMH reviewed below who presents here for work-in visit for DOE at the request of Aldo Hun, MD.  PMH: Cryptogenic stroke Left Main CAD: 55% dLM, tandem p-mLAD 75% -> CABG LIMA-LAD, SVG-D2, SVG-OM, SVG-PDA (July 2018)    Jordan [name] was last seen by Lawana Pray on September 09, 2023  Left knee arthroplasty October 24, 2023  Subjective  Discussed the use of AI scribe software for clinical note transcription with the patient, who gave verbal consent to proceed.  History of Present Illness History of Present Illness Jordan [name] is an 83 year old male with coronary artery disease and prior bypass surgery who presents with shortness of breath and elevated blood pressure. He is accompanied by his wife, Jordan [name].  He has experienced shortness of breath for the past six months, particularly noticeable when walking up inclines, causing him to 'puff and puff.' This symptom is similar to what he experienced prior to his previous bypass surgery. No chest pain, orthopnea, paroxysmal nocturnal dyspnea, leg swelling, or heart palpitations. He has noticed improvement in his symptoms over the past week, feeling better than he did three months ago. He described the pollen as being particularly bad this year and wondered if allergies could be contributing to his symptoms.  His blood pressure, which is usually between 115-120 mmHg, spiked to the 150s two weeks ago for a period of two to three days. He has not experienced any similar episodes since then. He is currently taking aspirin  81 mg, Zetia  10 mg, Pravachol  20 mg, and  Valsartan 80 mg (half a tablet twice a day). He notes that he bruises easily, which he attributes to the aspirin .  He underwent partial knee surgery approximately nine weeks ago, which healed well without complications. He also recalls a significant thumb injury eight or nine months ago, which healed completely without pain.  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 He recently celebrated his 83st birthday.  Cardiovascular ROS: positive for - dyspnea on exertion and still very active, but has noted symptoms over the last several weeks but improving recently. negative for - chest pain, edema, irregular heartbeat, orthopnea, palpitations, paroxysmal nocturnal dyspnea, rapid heart rate, shortness of breath, or syncope or near syncope or TIA or amaurosis fugax, claudication  ROS:  Review of Systems - Negative except symptoms noted above    Objective   CV medications - Baby aspirin  - Aspirin  81 mg - Zetia  10 mg - Pravachol  20 mg - Valsartan 80 mg half a tablet twice a day  He also takes testosterone  supplement and as needed Cialis 20 mg  Studies Reviewed: Jordan [name]   EKG Interpretation Date/Time:  Monday February 03 2024 08:52:55 EDT Ventricular Rate:  59 PR Interval:  228 QRS Duration:  92 QT Interval:  432 QTC Calculation: 427 R Axis:   -21  Text Interpretation: Sinus bradycardia with 1st degree A-V block When compared with ECG of 09-Sep-2023 08:55, No significant change was found Confirmed by Randene Bustard ([id number])  on 02/03/2024 9:06:35 AM    Cardiac Cath: LM 55%.  P-M LAD 75%, M-D LAD 75%.  Moderate-nonobstructive M-dRCA.  EF 55 to 60%.=> CVTS consult (03/18/2017)  ECHO: Normal LVEF 60 to 65% no RWMA.  Moderate LA dilation.  AOV calcification/sclerosis with no stenosis.  (08/20/2021) Post surveillance CABG Myoview : No evidence of ischemia or infarction.  Exercised 7 min; 8.5 METS.  EF 60%.  LOW RISK.   (05/07/2022) Pre-CABG Myoview : EF 55 to 60%.  Horizontal ST depression of 3 mm.  (II, III, aVF, V6, V5 and V4) small sized moderate intensity defect in the mid to anterior apical wall concerning for ischemia.  (June 2018)  Labs from PCP  08/28/2023: TC 108, TG 150, HDL 35, LDL 71. 10/17/2023: Hgb 11.7, Cr 0.3  Risk Assessment/Calculations:          Physical Exam:   VS:  BP 120/60   Pulse (!) 59   Ht 5' 6.5 (1.689 m)   Wt 176 lb 9.6 oz (80.1 kg)   SpO2 96%   BMI 28.08 kg/m    Wt Readings from Last 3 Encounters:  02/03/24 176 lb 9.6 oz (80.1 kg)  10/24/23 175 lb 6.4 oz (79.6 kg)  10/17/23 175 lb 6.4 oz (79.6 kg)    GEN: Well nourished, well developed in no acute distress; healthy appearing NECK: No JVD; No carotid bruits CARDIAC: Normal S1, split S2 vs S4; RRR, no murmurs, rubs, gallops RESPIRATORY:  Clear to auscultation without rales, wheezing or rhonchi ; nonlabored, good air movement. ABDOMEN: Soft, non-tender, non-distended EXTREMITIES:  No edema; No deformity      ASSESSMENT AND PLAN: .    Problem List Items Addressed This Visit       Cardiology Problems   Coronary artery disease involving native heart with angina pectoris (HCC) - Primary (Chronic)   Previous anginal equivalent was mostly exertional dyspnea and exercise intolerance.  Currently now having some concerning exertional dyspnea symptoms although he is very active and feeling better over the last several days.  Still denies any actual chest pain.  Differential includes graft closure or progression of native disease. Discussed invasive vs. non-invasive testing. He prefers to avoid stents due to antiplatelet therapy concerns. Explained potential need for stents if significant blockage is found.  (He indicated that he is not a huge fan of the potential for stents based on concerns for worsening bleeding or bruising that would limit his ability to be as active as he is) - Order Treadmill MyoviewStress Test and  Echocardiogram to assess for ischemia, evaluate heart function, and valve status. - Reassess after stress test and echocardiogram results.  The fact that his pre-CABG Myoview  showed a defect that was not seen on post CABG, I think this is an adequate test.      Relevant Orders   EKG 12-Lead (Completed)   ECHOCARDIOGRAM COMPLETE   MYOCARDIAL PERFUSION IMAGING   Cardiac Stress Test: Informed Consent Details: Physician/Practitioner Attestation; Transcribe to consent form and obtain patient signature   Essential hypertension (Chronic)   Blood pressure well-controlled on valsartan 80 mg daily. -No change      Relevant Orders   ECHOCARDIOGRAM COMPLETE   MYOCARDIAL PERFUSION IMAGING   Hyperlipidemia with target low density lipoprotein (LDL) cholesterol less than 55 mg/dL (Chronic)   Most recent LDL on pravastatin  was 71.  Relatively reasonable based on his age and high level activity.  He was not reluctant to take more potent medications citing myalgias with atorvastatin and rosuvastatin..  -  Low threshold to titrate up slightly to 40 mg pravastatin  from existing dose of 20 mg      Relevant Orders   ECHOCARDIOGRAM COMPLETE   MYOCARDIAL PERFUSION IMAGING     Other   Antiphospholipid antibody syndrome (HCC) (Chronic)   Relevant Orders   ECHOCARDIOGRAM COMPLETE   MYOCARDIAL PERFUSION IMAGING   Dyspnea on exertion   Exertional dyspnea for six months. Differential includes cardiac, pulmonary, or allergic causes. Recent symptom improvement suggests allergic component. - Order stress test and echocardiogram to assess for cardiac causes of dyspnea and rule out pulmonary hypertension.      Relevant Orders   EKG 12-Lead (Completed)   ECHOCARDIOGRAM COMPLETE   MYOCARDIAL PERFUSION IMAGING   Cardiac Stress Test: Informed Consent Details: Physician/Practitioner Attestation; Transcribe to consent form and obtain patient signature   S/P CABG x 4 (Chronic)   He underwent CABG in the setting of  really left main disease with significant LAD disease.  The RCA and circumflex were not overly diseased yet he was still grafted to an OM and distal right/PDA.  1 concern is that he potentially had progression of native disease and now has graft closure. . Plan to assess with Treadmill Stress Myoview       Relevant Orders   EKG 12-Lead (Completed)   ECHOCARDIOGRAM COMPLETE   MYOCARDIAL PERFUSION IMAGING   Cardiac Stress Test: Informed Consent Details: Physician/Practitioner Attestation; Transcribe to consent form and obtain patient signature       Informed Consent   Shared Decision Making/Informed Consent The risks [chest pain, shortness of breath, cardiac arrhythmias, dizziness, blood pressure fluctuations, myocardial infarction, stroke/transient ischemic attack, nausea, vomiting, allergic reaction, radiation exposure, metallic taste sensation and life-threatening complications (estimated to be 1 in 10,000)], benefits (risk stratification, diagnosing coronary artery disease, treatment guidance) and alternatives of a nuclear stress test were discussed in detail with Jordan [name] and he agrees to proceed.      Follow-Up: Return in about 29 days (around 03/03/2024) for Routine follow up with me, To discuss test results. Plan to review test results and reassess symptoms. - Follow up in one month to review stress test and echocardiogram results.  I spent 55 minutes in the care of Maurie Jeison [name] today including reviewing outside labs from reviewed PCP labs via [facility] (1 minute), reviewing studies (Previous cath, CABG, Myoview  and echo reports and Images reviewed-5 minutes), face to face time discussing treatment options (33), reviewing records from previous notes (2 minutes), 8 minutes, and documenting in the encounter.    Signed, Arleen Lacer, MD, MS Randene Bustard, M.D., M.S. Interventional Chartered certified accountant  Pager # [phone]

## 2024-02-03 NOTE — Telephone Encounter (Signed)
 Reminder and instruction letter for upcoming stress test on 02/11/24 sent via [employer]

## 2024-02-05 NOTE — Assessment & Plan Note (Addendum)
 He underwent CABG in the setting of really left main disease with significant LAD disease.  The RCA and circumflex were not overly diseased yet he was still grafted to an OM and distal right/PDA.  1 concern is that he potentially had progression of native disease and now has graft closure. . Plan to assess with Treadmill Stress Myoview 

## 2024-02-05 NOTE — Assessment & Plan Note (Signed)
 Blood pressure well-controlled on valsartan 80 mg daily. -No change

## 2024-02-05 NOTE — Assessment & Plan Note (Addendum)
 Previous anginal equivalent was mostly exertional dyspnea and exercise intolerance.  Currently now having some concerning exertional dyspnea symptoms although he is very active and feeling better over the last several days.  Still denies any actual chest pain.  Differential includes graft closure or progression of native disease. Discussed invasive vs. non-invasive testing. He prefers to avoid stents due to antiplatelet therapy concerns. Explained potential need for stents if significant blockage is found.  (He indicated that he is not a huge fan of the potential for stents based on concerns for worsening bleeding or bruising that would limit his ability to be as active as he is) - Order Treadmill MyoviewStress Test and Echocardiogram to assess for ischemia, evaluate heart function, and valve status. - Reassess after stress test and echocardiogram results.  The fact that his pre-CABG Myoview  showed a defect that was not seen on post CABG, I think this is an adequate test.

## 2024-02-05 NOTE — Assessment & Plan Note (Signed)
 Most recent LDL on pravastatin  was 71.  Relatively reasonable based on his age and high level activity.  He was not reluctant to take more potent medications citing myalgias with atorvastatin and rosuvastatin..  - Low threshold to titrate up slightly to 40 mg pravastatin  from existing dose of 20 mg

## 2024-02-05 NOTE — Assessment & Plan Note (Signed)
 Exertional dyspnea for six months. Differential includes cardiac, pulmonary, or allergic causes. Recent symptom improvement suggests allergic component. - Order stress test and echocardiogram to assess for cardiac causes of dyspnea and rule out pulmonary hypertension.

## 2024-02-06 ENCOUNTER — Ambulatory Visit (INDEPENDENT_AMBULATORY_CARE_PROVIDER_SITE_OTHER)

## 2024-02-06 ENCOUNTER — Other Ambulatory Visit: Payer: Self-pay | Admitting: Cardiology

## 2024-02-06 ENCOUNTER — Ambulatory Visit: Payer: Self-pay | Admitting: Cardiology

## 2024-02-06 DIAGNOSIS — I10 Essential (primary) hypertension: Secondary | ICD-10-CM

## 2024-02-06 DIAGNOSIS — D6861 Antiphospholipid syndrome: Secondary | ICD-10-CM

## 2024-02-06 DIAGNOSIS — R0609 Other forms of dyspnea: Secondary | ICD-10-CM

## 2024-02-06 DIAGNOSIS — I639 Cerebral infarction, unspecified: Secondary | ICD-10-CM | POA: Diagnosis not present

## 2024-02-06 DIAGNOSIS — E785 Hyperlipidemia, unspecified: Secondary | ICD-10-CM

## 2024-02-06 DIAGNOSIS — Z951 Presence of aortocoronary bypass graft: Secondary | ICD-10-CM

## 2024-02-06 DIAGNOSIS — I25119 Atherosclerotic heart disease of native coronary artery with unspecified angina pectoris: Secondary | ICD-10-CM

## 2024-02-06 LAB — CUP PACEART REMOTE DEVICE CHECK
Date Time Interrogation Session: 20250618230657
Implantable Pulse Generator Implant Date: 20201216

## 2024-02-11 ENCOUNTER — Ambulatory Visit (HOSPITAL_COMMUNITY)
Admission: RE | Admit: 2024-02-11 | Discharge: 2024-02-11 | Disposition: A | Discharge status: Home / Self Care | Source: Ambulatory Visit | Attending: Cardiology | Admitting: Cardiology

## 2024-02-11 DIAGNOSIS — D6861 Antiphospholipid syndrome: Secondary | ICD-10-CM

## 2024-02-11 DIAGNOSIS — I25119 Atherosclerotic heart disease of native coronary artery with unspecified angina pectoris: Secondary | ICD-10-CM | POA: Diagnosis not present

## 2024-02-11 DIAGNOSIS — E785 Hyperlipidemia, unspecified: Secondary | ICD-10-CM | POA: Diagnosis not present

## 2024-02-11 DIAGNOSIS — Z951 Presence of aortocoronary bypass graft: Secondary | ICD-10-CM | POA: Diagnosis not present

## 2024-02-11 DIAGNOSIS — R0609 Other forms of dyspnea: Secondary | ICD-10-CM

## 2024-02-11 DIAGNOSIS — I10 Essential (primary) hypertension: Secondary | ICD-10-CM

## 2024-02-11 LAB — MYOCARDIAL PERFUSION IMAGING
Angina Index: 0
Duke Treadmill Score: 8
Estimated workload: 8
Exercise duration (min): 7 min
Exercise duration (sec): 31 s
LV dias vol: 118 mL (ref 62–150)
LV sys vol: 28 mL (ref 4.2–5.8)
MPHR: 139 {beats}/min
Nuc Stress EF: 76 %
Peak HR: 120 {beats}/min
Percent HR: 86 %
Rest HR: 61 {beats}/min
Rest Nuclear Isotope Dose: 10.4 mCi
SDS: 0
SRS: 1
SSS: 0
ST Depression (mm): 0 mm
Stress Nuclear Isotope Dose: 30.3 mCi
TID: 0.88

## 2024-02-11 LAB — ECHOCARDIOGRAM COMPLETE
Area-P 1/2: 2.6 cm2
Height: 66 in
MV M vel: 4.3 m/s
MV Peak grad: 74 mmHg
Radius: 0.6 cm
S' Lateral: 3 cm
Weight: 2816 [oz_av]

## 2024-02-11 MED ORDER — TECHNETIUM TC 99M TETROFOSMIN IV KIT
30.3000 | PACK | Freq: Once | INTRAVENOUS | Status: AC | PRN
  Administered 2024-02-11: 30.3 via INTRAVENOUS

## 2024-02-11 MED ORDER — TECHNETIUM TC 99M TETROFOSMIN IV KIT
10.4000 | PACK | Freq: Once | INTRAVENOUS | Status: AC | PRN
  Administered 2024-02-11: 10.4 via INTRAVENOUS

## 2024-02-13 ENCOUNTER — Ambulatory Visit: Payer: Self-pay | Admitting: Cardiology

## 2024-02-16 ENCOUNTER — Ambulatory Visit: Payer: Self-pay | Admitting: Cardiology

## 2024-02-18 NOTE — Telephone Encounter (Signed)
 Open error

## 2024-02-19 ENCOUNTER — Other Ambulatory Visit (HOSPITAL_COMMUNITY): Payer: Self-pay | Admitting: Internal Medicine

## 2024-02-19 DIAGNOSIS — R161 Splenomegaly, not elsewhere classified: Secondary | ICD-10-CM

## 2024-02-19 DIAGNOSIS — R9389 Abnormal findings on diagnostic imaging of other specified body structures: Secondary | ICD-10-CM

## 2024-02-25 ENCOUNTER — Ambulatory Visit (HOSPITAL_BASED_OUTPATIENT_CLINIC_OR_DEPARTMENT_OTHER)
Admission: RE | Admit: 2024-02-25 | Discharge: 2024-02-25 | Disposition: A | Discharge status: Home / Self Care | Source: Ambulatory Visit | Attending: Internal Medicine | Admitting: Internal Medicine

## 2024-02-25 ENCOUNTER — Other Ambulatory Visit (HOSPITAL_COMMUNITY): Payer: Self-pay | Admitting: Internal Medicine

## 2024-02-25 DIAGNOSIS — I700 Atherosclerosis of aorta: Secondary | ICD-10-CM | POA: Diagnosis not present

## 2024-02-25 DIAGNOSIS — R0602 Shortness of breath: Secondary | ICD-10-CM | POA: Diagnosis not present

## 2024-02-25 DIAGNOSIS — R9389 Abnormal findings on diagnostic imaging of other specified body structures: Secondary | ICD-10-CM | POA: Diagnosis not present

## 2024-02-25 DIAGNOSIS — R161 Splenomegaly, not elsewhere classified: Secondary | ICD-10-CM

## 2024-02-25 DIAGNOSIS — R918 Other nonspecific abnormal finding of lung field: Secondary | ICD-10-CM | POA: Diagnosis not present

## 2024-02-27 NOTE — Progress Notes (Signed)
 Carelink Summary Report / Loop Recorder

## 2024-02-28 NOTE — Telephone Encounter (Signed)
 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  Only info received was an ultrasound reporting normal liver and splenomegaly.  No labs received.    No indication for hepatology referral based on records received as appearance of the liver is normal.   Referral not appropriate; can reassess if additional records received that indicate need for referral   Referring: Mark A. Shayne, MD Internal Medicine [facility], [place] [address], [place], [id number] Tel: [phone] Fax: [phone]

## 2024-02-28 NOTE — Telephone Encounter (Signed)
 Faxed letter to provider informing him that the referral is not appropriate.

## 2024-03-03 ENCOUNTER — Ambulatory Visit: Attending: Cardiology | Admitting: Cardiology

## 2024-03-03 ENCOUNTER — Encounter: Payer: Self-pay | Admitting: Cardiology

## 2024-03-03 VITALS — BP 102/58 | HR 64 | Ht 66.0 in | Wt 167.0 lb

## 2024-03-03 DIAGNOSIS — I10 Essential (primary) hypertension: Secondary | ICD-10-CM | POA: Diagnosis not present

## 2024-03-03 DIAGNOSIS — Z951 Presence of aortocoronary bypass graft: Secondary | ICD-10-CM | POA: Diagnosis not present

## 2024-03-03 DIAGNOSIS — R0609 Other forms of dyspnea: Secondary | ICD-10-CM | POA: Diagnosis not present

## 2024-03-03 DIAGNOSIS — D6861 Antiphospholipid syndrome: Secondary | ICD-10-CM | POA: Insufficient documentation

## 2024-03-03 DIAGNOSIS — R233 Spontaneous ecchymoses: Secondary | ICD-10-CM | POA: Insufficient documentation

## 2024-03-03 DIAGNOSIS — E785 Hyperlipidemia, unspecified: Secondary | ICD-10-CM | POA: Insufficient documentation

## 2024-03-03 DIAGNOSIS — I25119 Atherosclerotic heart disease of native coronary artery with unspecified angina pectoris: Secondary | ICD-10-CM | POA: Insufficient documentation

## 2024-03-03 NOTE — Patient Instructions (Signed)
 Medication Instructions:   No changes  *If you need a refill on your cardiac medications before your next appointment, please call your pharmacy*   Lab Work: CBC Iron Panel   If you have labs (blood work) drawn today and your tests are completely normal, you will receive your results only by: MyChart Message (if you have MyChart) OR A paper copy in the mail If you have any lab test that is abnormal or we need to change your treatment, we will call you to review the results.   Testing/Procedures:    Follow-Up: At [facility], you and your health needs are our priority.  As part of our continuing mission to provide you with exceptional heart care, we have created designated Provider Care Teams.  These Care Teams include your primary Cardiologist (physician) and Advanced Practice Providers (APPs -  Physician Assistants and Nurse Practitioners) who all work together to provide you with the care you need, when you need it.     Your next appointment:   6 month(s)  The format for your next appointment:   In Person  Provider:   Alm Clay, MD

## 2024-03-03 NOTE — Progress Notes (Addendum)
 Cardiology Office Note:  .   Date:  03/07/2024  ID:  Jordan [name], DOB 1941-04-13, MRN [id number] PCP/Referring Provider: Shayne Anes, MD  [facility] Providers Cardiologist:  Alm Clay, MD Electrophysiologist:  Soyla Gladis Norton, MD     Chief Complaint  Patient presents with   Follow-up    Close follow-up to discuss test results   Coronary Artery Disease    Exercise tolerance seems to be improving.  No chest pain.  Maybe still a bit more fatigued than usual but has been exercising well.  Mostly notes bruising.    Patient Profile: .     Jordan [name] is a very active / healthy 83 y.o. male with a PMH notable for MV-CAD-CABG & Cryptogenic CVA, HTN, HLD who presents here for close f/u to discuss test results-echocardiogram and Myoview  ordered for exertional dyspnea..   Jordan [name] is a very healhty appearing 83 y.o. male  with a PMH reviewed below who presents here for work-in visit for DOE at the request of Shayne Anes, MD.  PMH: Cryptogenic stroke Left Main CAD: 55% dLM, tandem p-mLAD 75% -> CABG LIMA-LAD, SVG-D2, SVG-OM, SVG-PDA (July 2018) HTN, HLD     Jordan [name] was recently seen on February 03, 2024 for evaluation of worsening exertional dyspnea with some chest discomfort concerning for possible recurrent angina.  We decided to move up his screening stress testing and ordered a Myoview  stress test along with echocardiogram.  He now returns to discuss results  Subjective  Discussed the use of AI scribe software for clinical note transcription with the patient, who gave verbal consent to proceed.  History of Present Illness  Jordan [name] is an 83 year old male with hypertension and coronary artery disease who presents to discuss results of recent testing.  He also notes a major complaint of increased bruising and bleeding.  He has increased bruising and bleeding, bruising easily and bleeding with minimal trauma. He  has been on aspirin  long-term, which he believes contributes to the bruising, but notes recent worsening.  There have been changes in his medication regimen, specifically with valsartan. Initially, he was on 80 mg daily, which was changed to 40 mg twice a day about a year ago, and recently adjusted to 80 mg twice a day. He is unsure if he is consistently taking the new dosage as he is finishing his old prescription. He also mentions a recent lapse in taking pravastatin  for about a month, which he resumed last week.  His blood pressure has been lower than usual, ranging from 108 to 115, which is the lowest it has been in ten years.  He is physically active, engaging in activities such as pushups, biking, kayaking, and golfing. Despite his activity level, he feels more fatigued than six months ago and experiences shortness of breath, particularly when walking uphill or during intense exertion.  He has experienced weight loss, approximately 7-8 pounds, which he attributes to smaller portion sizes since moving to a new residence with different dining options. He continues to eat dessert but in smaller portions.  He has a history of bypass surgery and reports experiencing more fatigue and shortness of breath during physical activities. No chest pain but increased breathing difficulty during exertion.  Cardiovascular ROS: no chest pain or dyspnea on exertion positive for - notable bruising, and still maybe not the same level of exercise tolerance that he has had before but notably improving; more easily fatigued.. negative for -  irregular heartbeat, orthopnea, palpitations, paroxysmal nocturnal dyspnea, rapid heart rate, shortness of breath, or syncope or near syncope or TIA or amaurosis fugax, claudication.  ROS:  Review of Systems - Negative except symptoms noted above.    Objective   Social History - Employment: Retired.  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 - Living Situation: Lives in the main building at [place], previously [place]. - The patient is physically active, engaging in activities such as pushups, biking, kayaking, and golf.   Studies Reviewed: SABRA        [facility] labs via [facility]:  08/28/2023: TC 108, TG 657, HDL 26, LDL 71.  A1c 5.0.;  TSH 4.39 10/17/2023: Hgb 11.7, Cr 0.83, K+ 4.1  Echocardiogram: Normal LV size and function.  EF 60 to 65%.  No RWMA.  GR 2 DD with moderate LA dilation.  Moderate RA dilation.  Normal RVP and RAP.  Mild AoV sclerosis with no stenosis.  Mild MR.  (02/11/2024)  TM Myoview : Exercised 7:31 min, peak HR 120 bpm (86% MPHR).  8 METS.  EKG negative for ischemia.  Hyperdynamic LV function EF 65 to 75%.  No Ischemia or Infarction.  NORMAL STUDY.  (02/11/2024)  Cardiac Cath: LM 55%.  P-M LAD 75%, M-D LAD 75%.  Moderate-nonobstructive M-dRCA.  EF 55 to 60%.=> CVTS consult (03/18/2017) => CABG LIMA-LAD, SVG-D2, SVG-OM, SVG-PDA (July 2018)    Risk Assessment/Calculations:          Physical Exam:   VS:  BP (!) 102/58   Pulse 64   Ht 5' 6 (1.676 m)   Wt 167 lb (75.8 kg)   SpO2 95%   BMI 26.95 kg/m    Wt Readings from Last 3 Encounters:  03/03/24 167 lb (75.8 kg)  02/11/24 176 lb (79.8 kg)  02/03/24 176 lb 9.6 oz (80.1 kg)    GEN: Well nourished, well groomed in no acute distress; healthy appearing NECK: No JVD; No carotid bruits CARDIAC: Normal S1, S2; RRR, no murmurs, rubs, gallops RESPIRATORY:  Clear to auscultation without rales, wheezing or rhonchi ; nonlabored, good air movement. ABDOMEN: Soft, non-tender, non-distended EXTREMITIES:  No edema; No deformity      ASSESSMENT AND PLAN: .    Problem List Items Addressed This Visit       Cardiology Problems   Coronary artery disease involving native coronary artery of native heart - Primary (Chronic)   His previous anginal equivalent was really exertional dyspnea and  exercise intolerance and he has had some of that now.  He was therefore evaluated with a repeat treadmill Myoview  and echocardiogram.  Thankfully, both were relatively reassuring.  No evidence of infarct or ischemia on Myoview  and he had good exercise tolerance reaching 8 METS which I would have expected more.  He said he probably could have exercised more but it was really the incline that made it more difficult.  -Continue 81 mg aspirin  although he is deafly having some bleeding issues he can potentially reduce dosing to every other day. - Continue Zetia  10 mg daily along with pravastatin  20 mg daily with labs been pretty well-controlled as of January. - He is currently titrating up to 80 twice daily valsartan which is reasonable given the need for afterload reduction, however it may be contributing some to his fatigue, low threshold to reduce p.m. dose to 40 mg continue morning dose 80  mg. - Especially with his history of fatigue, not on beta-blocker.  - Continue regular exercise focusing on endurance.        Relevant Orders   CBC (Completed)   Iron, TIBC and Ferritin Panel (Completed)   Essential hypertension (Chronic)   Hypertension well-controlled with valsartan. Blood pressure readings range from 105 to 120 mmHg since his PCP is bumped him up to 80 mg twice daily. - Continue valsartan 80 mg twice daily. - Monitor blood pressure regularly. -If pressures remain in the low side, would consider reducing p.m. dose to 40 mg.      Hyperlipidemia with target low density lipoprotein (LDL) cholesterol less than 55 mg/dL (Chronic)   Most recent LDL was 71.  He is really reluctant to do much more aggressive therapy.  For now we will continue Zetia  and pravastatin  but low threshold to potentially titrate up to 40 pravastatin .        Other   Antiphospholipid antibody syndrome (HCC) (Chronic)   Previously declined DOAC because of issues with bleeding and bruising.  Remaining on aspirin  but with  the easy bleeding and bruising still will reduce dose of aspirin  to every other day.      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  He then went back to try to do his routine exercise and was not able to bounce right back to his baseline. Certainly this combination of diastolic dysfunction although I do not think this is a new finding for him.  PCP appropriately increased his ARB dose of valsartan to 80 mg twice daily although I am worried that this may be contributing to fatigue.  May want to consider reducing p.m. dose to 40 mg.  He is euvolemic with no PND, orthopnea or edema.  Therefore no need for diuretic.  Checking CBC for concerns for possible anemia with easy bruising.      Easy bruising   Previously you declined DOAC because of bruising.  Has still had issues with bruising on aspirin . Platelets on most recent CBC showed a slight dip from previous levels. Also had a decrease in hemoglobin from 14.6 to 11.7, suggesting possible anemia contributing to fatigue and dyspnea. - Switch aspirin  to every other day. - Order CBC and differential to assess platelet levels and hemoglobin.      Relevant Orders   CBC (Completed)   Iron, TIBC and Ferritin Panel (Completed)   S/P CABG x 4 (Chronic)   Reassuring Myoview  and Echocardiogram results.  No evidence of ischemia or infarction and no significant wall motion abnormalities.  Only notes grade 2 diastolic dysfunction.      Relevant Orders   CBC (Completed)   Iron, TIBC and Ferritin Panel (Completed)          Follow-Up: Return in about 6 months (around 09/03/2024) for 6 month follow-up with me, [employer].  Total time spent: 25 min spent with patient + 17 min spent charting = 42 min I spent 42 minutes in the care of Jordan [name] today including reviewing outside labs from PCP via [facility] (1  minute), reviewing studies (I personally reviewed the results/images of both the Myoview  and echocardiogram-7 minutes), face to face time discussing treatment options (25 minutes), 9 minutes dictating, and documenting in the encounter.    Signed, Alm MICAEL Clay, MD, MS Alm Clay, M.D., M.S. Interventional Cardiologist  [employer] Teacher, English as a foreign language  Pager # [phone]

## 2024-03-04 ENCOUNTER — Ambulatory Visit: Payer: Self-pay | Admitting: Cardiology

## 2024-03-04 LAB — CBC
Hematocrit: 33 % — ABNORMAL LOW (ref 37.5–51.0)
Hemoglobin: 10.9 g/dL — ABNORMAL LOW (ref 13.0–17.7)
MCH: 28.8 pg (ref 26.6–33.0)
MCHC: 33 g/dL (ref 31.5–35.7)
MCV: 87 fL (ref 79–97)
Platelets: 158 x10E3/uL (ref 150–450)
RBC: 3.79 x10E6/uL — ABNORMAL LOW (ref 4.14–5.80)
RDW: 15.8 % — ABNORMAL HIGH (ref 11.6–15.4)
WBC: 8.1 x10E3/uL (ref 3.4–10.8)

## 2024-03-04 LAB — IRON,TIBC AND FERRITIN PANEL
Ferritin: 275 ng/mL (ref 30–400)
Iron Saturation: 22 % (ref 15–55)
Iron: 59 ug/dL (ref 38–169)
Total Iron Binding Capacity: 273 ug/dL (ref 250–450)
UIBC: 214 ug/dL (ref 111–343)

## 2024-03-07 ENCOUNTER — Encounter: Payer: Self-pay | Admitting: Cardiology

## 2024-03-07 NOTE — Assessment & Plan Note (Addendum)
 His previous anginal equivalent was really exertional dyspnea and exercise intolerance and he has had some of that now.  He was therefore evaluated with a repeat treadmill Myoview  and echocardiogram.  Thankfully, both were relatively reassuring.  No evidence of infarct or ischemia on Myoview  and he had good exercise tolerance reaching 8 METS which I would have expected more.  He said he probably could have exercised more but it was really the incline that made it more difficult.  -Continue 81 mg aspirin  although he is deafly having some bleeding issues he can potentially reduce dosing to every other day. - Continue Zetia  10 mg daily along with pravastatin  20 mg daily with labs been pretty well-controlled as of January. - He is currently titrating up to 80 twice daily valsartan which is reasonable given the need for afterload reduction, however it may be contributing some to his fatigue, low threshold to reduce p.m. dose to 40 mg continue morning dose 80 mg. - Especially with his history of fatigue, not on beta-blocker.  - Continue regular exercise focusing on endurance.

## 2024-03-07 NOTE — Assessment & Plan Note (Addendum)
 Previously you declined DOAC because of bruising.  Has still had issues with bruising on aspirin . Platelets on most recent CBC showed a slight dip from previous levels. Also had a decrease in hemoglobin from 14.6 to 11.7, suggesting possible anemia contributing to fatigue and dyspnea. - Switch aspirin  to every other day. - Order CBC and differential to assess platelet levels and hemoglobin.

## 2024-03-07 NOTE — Assessment & Plan Note (Signed)
 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  He then went back to try to do his routine exercise and was not able to bounce right back to his baseline. Certainly this combination of diastolic dysfunction although I do not think this is a new finding for him.  PCP appropriately increased his ARB dose of valsartan to 80 mg twice daily although I am worried that this may be contributing to fatigue.  May want to consider reducing p.m. dose to 40 mg.  He is euvolemic with no PND, orthopnea or edema.  Therefore no need for diuretic.  Checking CBC for concerns for possible anemia with easy bruising.

## 2024-03-07 NOTE — Assessment & Plan Note (Signed)
 Most recent LDL was 71.  He is really reluctant to do much more aggressive therapy.  For now we will continue Zetia  and pravastatin  but low threshold to potentially titrate up to 40 pravastatin .

## 2024-03-07 NOTE — Assessment & Plan Note (Signed)
 Reassuring Myoview  and Echocardiogram results.  No evidence of ischemia or infarction and no significant wall motion abnormalities.  Only notes grade 2 diastolic dysfunction.

## 2024-03-07 NOTE — Assessment & Plan Note (Signed)
 Previously declined DOAC because of issues with bleeding and bruising.  Remaining on aspirin  but with the easy bleeding and bruising still will reduce dose of aspirin  to every other day.

## 2024-03-07 NOTE — Assessment & Plan Note (Signed)
 Hypertension well-controlled with valsartan. Blood pressure readings range from 105 to 120 mmHg since his PCP is bumped him up to 80 mg twice daily. - Continue valsartan 80 mg twice daily. - Monitor blood pressure regularly. -If pressures remain in the low side, would consider reducing p.m. dose to 40 mg.

## 2024-03-09 ENCOUNTER — Ambulatory Visit

## 2024-03-09 DIAGNOSIS — I639 Cerebral infarction, unspecified: Secondary | ICD-10-CM

## 2024-03-10 LAB — CUP PACEART REMOTE DEVICE CHECK
Date Time Interrogation Session: 20250720230519
Implantable Pulse Generator Implant Date: 20201216

## 2024-03-16 ENCOUNTER — Ambulatory Visit: Payer: Self-pay | Admitting: Cardiology

## 2024-03-19 DIAGNOSIS — R5383 Other fatigue: Secondary | ICD-10-CM | POA: Diagnosis not present

## 2024-04-07 NOTE — Progress Notes (Signed)
 Carelink Summary Report / Loop Recorder

## 2024-04-09 ENCOUNTER — Ambulatory Visit (INDEPENDENT_AMBULATORY_CARE_PROVIDER_SITE_OTHER)

## 2024-04-09 ENCOUNTER — Ambulatory Visit: Payer: Self-pay | Admitting: Cardiology

## 2024-04-09 DIAGNOSIS — I639 Cerebral infarction, unspecified: Secondary | ICD-10-CM

## 2024-04-09 LAB — CUP PACEART REMOTE DEVICE CHECK
Date Time Interrogation Session: 20250820230633
Implantable Pulse Generator Implant Date: 20201216

## 2024-04-23 DIAGNOSIS — M8589 Other specified disorders of bone density and structure, multiple sites: Secondary | ICD-10-CM | POA: Diagnosis not present

## 2024-04-28 DIAGNOSIS — J984 Other disorders of lung: Secondary | ICD-10-CM | POA: Diagnosis not present

## 2024-04-28 DIAGNOSIS — D649 Anemia, unspecified: Secondary | ICD-10-CM | POA: Diagnosis not present

## 2024-04-28 DIAGNOSIS — Z1389 Encounter for screening for other disorder: Secondary | ICD-10-CM | POA: Diagnosis not present

## 2024-04-28 DIAGNOSIS — I5033 Acute on chronic diastolic (congestive) heart failure: Secondary | ICD-10-CM | POA: Diagnosis not present

## 2024-04-28 DIAGNOSIS — R7301 Impaired fasting glucose: Secondary | ICD-10-CM | POA: Diagnosis not present

## 2024-04-28 DIAGNOSIS — M1990 Unspecified osteoarthritis, unspecified site: Secondary | ICD-10-CM | POA: Diagnosis not present

## 2024-04-28 DIAGNOSIS — E291 Testicular hypofunction: Secondary | ICD-10-CM | POA: Diagnosis not present

## 2024-04-28 DIAGNOSIS — E785 Hyperlipidemia, unspecified: Secondary | ICD-10-CM | POA: Diagnosis not present

## 2024-04-28 DIAGNOSIS — I771 Stricture of artery: Secondary | ICD-10-CM | POA: Diagnosis not present

## 2024-04-28 DIAGNOSIS — I5032 Chronic diastolic (congestive) heart failure: Secondary | ICD-10-CM | POA: Diagnosis not present

## 2024-04-28 DIAGNOSIS — K219 Gastro-esophageal reflux disease without esophagitis: Secondary | ICD-10-CM | POA: Diagnosis not present

## 2024-04-28 DIAGNOSIS — J479 Bronchiectasis, uncomplicated: Secondary | ICD-10-CM | POA: Diagnosis not present

## 2024-04-28 DIAGNOSIS — R161 Splenomegaly, not elsewhere classified: Secondary | ICD-10-CM | POA: Diagnosis not present

## 2024-04-28 DIAGNOSIS — Z23 Encounter for immunization: Secondary | ICD-10-CM | POA: Diagnosis not present

## 2024-04-28 DIAGNOSIS — C439 Malignant melanoma of skin, unspecified: Secondary | ICD-10-CM | POA: Diagnosis not present

## 2024-04-28 DIAGNOSIS — I110 Hypertensive heart disease with heart failure: Secondary | ICD-10-CM | POA: Diagnosis not present

## 2024-05-08 DIAGNOSIS — Z23 Encounter for immunization: Secondary | ICD-10-CM | POA: Diagnosis not present

## 2024-05-11 ENCOUNTER — Ambulatory Visit

## 2024-05-12 NOTE — Progress Notes (Signed)
 Remote Loop Recorder Transmission

## 2024-05-14 LAB — CUP PACEART REMOTE DEVICE CHECK
Date Time Interrogation Session: 20250924104049
Implantable Pulse Generator Implant Date: 20201216

## 2024-05-15 ENCOUNTER — Ambulatory Visit: Payer: Self-pay | Admitting: Cardiology

## 2024-05-15 NOTE — Progress Notes (Signed)
 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 Appointment scheduled for Dr Federico 05/27/24 1300.

## 2024-05-25 NOTE — Progress Notes (Signed)
 Remote Loop Recorder Transmission

## 2024-05-27 ENCOUNTER — Inpatient Hospital Stay: Attending: Hematology and Oncology | Admitting: Hematology and Oncology

## 2024-05-27 ENCOUNTER — Inpatient Hospital Stay

## 2024-05-27 VITALS — BP 130/60 | HR 54 | Temp 97.9°F | Resp 14 | Wt 174.4 lb

## 2024-05-27 DIAGNOSIS — D472 Monoclonal gammopathy: Secondary | ICD-10-CM | POA: Insufficient documentation

## 2024-05-27 DIAGNOSIS — Z79899 Other long term (current) drug therapy: Secondary | ICD-10-CM | POA: Diagnosis not present

## 2024-05-27 DIAGNOSIS — Z800 Family history of malignant neoplasm of digestive organs: Secondary | ICD-10-CM | POA: Insufficient documentation

## 2024-05-27 DIAGNOSIS — I519 Heart disease, unspecified: Secondary | ICD-10-CM | POA: Diagnosis not present

## 2024-05-27 DIAGNOSIS — K219 Gastro-esophageal reflux disease without esophagitis: Secondary | ICD-10-CM | POA: Insufficient documentation

## 2024-05-27 DIAGNOSIS — I119 Hypertensive heart disease without heart failure: Secondary | ICD-10-CM | POA: Diagnosis not present

## 2024-05-27 DIAGNOSIS — R161 Splenomegaly, not elsewhere classified: Secondary | ICD-10-CM | POA: Insufficient documentation

## 2024-05-27 DIAGNOSIS — Z803 Family history of malignant neoplasm of breast: Secondary | ICD-10-CM | POA: Insufficient documentation

## 2024-05-27 LAB — CBC WITH DIFFERENTIAL (CANCER CENTER ONLY)
Abs Immature Granulocytes: 0.01 K/uL (ref 0.00–0.07)
Basophils Absolute: 0 K/uL (ref 0.0–0.1)
Basophils Relative: 0 %
Eosinophils Absolute: 0.1 K/uL (ref 0.0–0.5)
Eosinophils Relative: 1 %
HCT: 34.1 % — ABNORMAL LOW (ref 39.0–52.0)
Hemoglobin: 11.3 g/dL — ABNORMAL LOW (ref 13.0–17.0)
Immature Granulocytes: 0 %
Lymphocytes Relative: 65 %
Lymphs Abs: 5.4 K/uL — ABNORMAL HIGH (ref 0.7–4.0)
MCH: 28.8 pg (ref 26.0–34.0)
MCHC: 33.1 g/dL (ref 30.0–36.0)
MCV: 87 fL (ref 80.0–100.0)
Monocytes Absolute: 0.9 K/uL (ref 0.1–1.0)
Monocytes Relative: 10 %
Neutro Abs: 2 K/uL (ref 1.7–7.7)
Neutrophils Relative %: 24 %
Platelet Count: 148 K/uL — ABNORMAL LOW (ref 150–400)
RBC: 3.92 MIL/uL — ABNORMAL LOW (ref 4.22–5.81)
RDW: 17 % — ABNORMAL HIGH (ref 11.5–15.5)
Smear Review: NORMAL
WBC Count: 8.4 K/uL (ref 4.0–10.5)
nRBC: 0 % (ref 0.0–0.2)

## 2024-05-27 LAB — CMP (CANCER CENTER ONLY)
ALT: 10 U/L (ref 0–44)
AST: 15 U/L (ref 15–41)
Albumin: 4.1 g/dL (ref 3.5–5.0)
Alkaline Phosphatase: 102 U/L (ref 38–126)
Anion gap: 5 (ref 5–15)
BUN: 20 mg/dL (ref 8–23)
CO2: 30 mmol/L (ref 22–32)
Calcium: 9.7 mg/dL (ref 8.9–10.3)
Chloride: 108 mmol/L (ref 98–111)
Creatinine: 0.81 mg/dL (ref 0.61–1.24)
GFR, Estimated: >60 mL/min (ref >60)
Glucose, Bld: 81 mg/dL (ref 70–99)
Potassium: 4.4 mmol/L (ref 3.5–5.1)
Sodium: 143 mmol/L (ref 135–145)
Total Bilirubin: 0.8 mg/dL (ref 0.0–1.2)
Total Protein: 7.1 g/dL (ref 6.5–8.1)

## 2024-05-27 LAB — LACTATE DEHYDROGENASE: LDH: 208 U/L — ABNORMAL HIGH (ref 98–192)

## 2024-05-27 NOTE — Progress Notes (Signed)
 Appointment with Dr Federico. More testing and Bone Survey ordered, depending on results will follow up in 6 months or sooner as needed. Will continue to monitor.

## 2024-05-27 NOTE — Progress Notes (Signed)
 [facility] [phone]   [phone]  INITIAL CONSULT NOTE  Patient Care Team: Shayne Anes, MD as PCP - General (Internal Medicine) Anner Alm ORN, MD as PCP - Cardiology (Cardiology) Inocencio Soyla Lunger, MD as PCP - Electrophysiology (Cardiology) Tat, Asberry RAMAN, DO as Consulting Physician (Neurology) Elana Montie CROME, RN as Oncology Nurse Navigator  Hematological/Oncological History # IgM Kappa Monoclonal Gammopathy # Splenomegaly  05/06/2024: M protein 0.4, IgM kappa specificity  05/27/2024: establish care with Dr. Federico   CHIEF COMPLAINTS/PURPOSE OF CONSULTATION:   IgM Kappa Monoclonal Gammopathy   HISTORY OF PRESENTING ILLNESS:  Jordan [name] 83 y.o. male with medical history significant for carotid stenosis, essential hypertension, GERD, hyperlipidemia, cardiac disease, migraines, and Mnire's disease who presents for evaluation of a monoclonal gammopathy.  On review of the previous records Mr. Spies had labs collected on 05/06/2024 which showed M protein of 0.4, with an IgM kappa specificity.  Due to concern for these findings the patient was referred to hematology for further evaluation and management.  On exam today Mr. Tierno reports that he has not been having any issues with bone pain or back pain.  He reports his energy levels are low and they are currently 7 or 8 out of 10.  He reports he is not eating as well or as much as he would like.  He reports that he has never had any issues before with his blood in the past.  He also notes he is not having any urinary issues such as foaming, bubbling, or change in the color.  On further discussion Mr. Ek reports that his mother had breast cancer and his father was a smoker.  He reports his paternal uncle had colon cancer and he had 2 healthy children.  He notes that he is a never smoker and drinks about 1 to 2 glasses of wine per night.  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  He otherwise denies any fevers, chills, sweats, nausea, vomiting or diarrhea.  A full 10 point ROS otherwise negative.  MEDICAL HISTORY:  Past Medical History:  Diagnosis Date   Carotid stenosis, asymptomatic 07/27/2011   Cryptogenic stroke (HCC) 04/2019   Minimal carotid disease, normal TTE/TEE; LINQ ILR Placed -> no documented A-fib   DDD (degenerative disc disease)    ED (erectile dysfunction)    Essential hypertension 01/29/2017   GERD (gastroesophageal reflux disease)    History of hiatal hernia    History of kidney stones    passed stones - no surgery required   Hyperlipidemia with target low density lipoprotein (LDL) cholesterol less than 70 mg/dL 91/87/7984   Impaired fasting glucose    Knee pain    bilateral   Left main coronary artery disease 03/18/2017   Cardiac Cath: 55% dLM, tandem p-mLAD 75% --> referred for CABG.   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 Lucas @ [facility]):  LIMA-LAD, SVG-D2, SVG-OM, SVG-PDA (endoscopic SVG harvesting from right leg).   Shoulder pain    Wears hearing aid     SURGICAL HISTORY: Past Surgical History:  Procedure Laterality Date   APPENDECTOMY     2020   BUBBLE STUDY  08/05/2019   Procedure: BUBBLE STUDY;  Surgeon: Lonni Slain,  MD;  Location: [facility] ENDOSCOPY;  Service: Cardiovascular;;   Cardiac Stress Test  2008   Normal   CARPAL TUNNEL RELEASE Left 1998   CARPAL TUNNEL RELEASE Right 2008   CHOLECYSTECTOMY, LAPAROSCOPIC  2021   in [place]   COLONOSCOPY  06/2013   [place] - polyps   CORONARY ARTERY BYPASS GRAFT N/A 04/04/2017   Procedure: CORONARY ARTERY BYPASS GRAFTING x 4  LIMA-LAD, SVG-D2, SVG-OM, SVG-PDA (endoscopic SVG harvesting from right leg);  Surgeon: Lucas Dorise POUR, MD;  Location: [facility] OR;  Service: Open  Heart Surgery;  Laterality: N/A;   EXTRACORPOREAL SHOCK WAVE LITHOTRIPSY Right 10/23/2018   Procedure: EXTRACORPOREAL SHOCK WAVE LITHOTRIPSY (ESWL);  Surgeon: Gaston Hamilton, MD;  Location: [facility] ORS;  Service: Urology;  Laterality: Right;   HERNIA REPAIR Left    inguinal   HIATAL HERNIA REPAIR     no surgery per pt   INCISION AND DRAINAGE WOUND WITH NAILBED REPAIR Left 05/28/2022   Procedure: Irrigation and debridement and open repair bone nail plate nailbed and soft tissue structures;  Surgeon: Camella Fallow, MD;  Location: [facility] OR;  Service: Orthopedics;  Laterality: Left;  1 hr Block with IV Sedation   KNEE CARTILAGE SURGERY Bilateral    LEFT HEART CATH AND CORONARY ANGIOGRAPHY N/A 03/18/2017   Procedure: Left Heart Cath and Coronary Angiography;  Surgeon: Anner Alm ORN, MD;  Location: [facility] INVASIVE CV LAB;  Service: Cardiovascular:  55% dLM, p-mLAD 75% - mLAD 75%.  EF 55-65%. Mod non-obstructive RCA disease --> referred for CABG   LOOP RECORDER INSERTION N/A 08/05/2019   Procedure: LOOP RECORDER INSERTION;  Surgeon: Inocencio Soyla Lunger, MD;  Location: [facility] INVASIVE CV LAB;  Service: Cardiovascular;  Laterality: N/A;   MEDIAL PARTIAL KNEE REPLACEMENT Right 2010   Partial right knee replacement   NM MYOVIEW  LTD  01/2017   INTERMEDIATE RISK: Exercise tolerance was good at 10 minutes, however, fatigue and dyspnea were reporte-d -> hypertensive response to exercise.  3mm Horizontal ST depressions noted during stress in the II, III, aVF, V6, V5 and V4 leads c/w ischemia.   Small siize, moderate severity perfusion defect  mid to distal anterior wall perfusion defect suggestive of ischemia..    NM MYOVIEW  LTD  05/07/2022   Treadmill Myoview : Average exercise capacity (7:00 min:s; 8.5 METS). Normal HR response to exercise. Hypertensive response to exercise.  EF 55 to 65% (60%).  Normal size and function.  NO EVIDENCE OF ISCHEMIA OR INFARCTION -> NORMAL/LOW RISK   PARTIAL KNEE ARTHROPLASTY Left  10/24/2023   Procedure: ARTHROPLASTY, KNEE, UNICOMPARTMENTAL;  Surgeon: Ernie Cough, MD;  Location: [facility] ORS;  Service: Orthopedics;  Laterality: Left;   ROTATOR CUFF REPAIR Left 2008   SHOULDER ARTHROSCOPY W/ ROTATOR CUFF REPAIR Right 06/15/2016   TEE WITHOUT CARDIOVERSION N/A 04/04/2017   Procedure: TRANSESOPHAGEAL ECHOCARDIOGRAM (TEE);  Surgeon: Lucas Dorise POUR, MD;  Location: [facility] OR;  Service: Open Heart Surgery;  Laterality: N/A;   TEE WITHOUT CARDIOVERSION N/A 08/05/2019   Procedure: TRANSESOPHAGEAL ECHOCARDIOGRAM (TEE);  Surgeon: Lonni Slain, MD;  Location: [facility] ENDOSCOPY:: EF 60 to 65%, no RWMA.  Normal atria, normal RV.  Normal valves.  No vegetation noted.  No LAA thrombus.  Negative bubble study.  Mild-diffuse aortic atherosclerosis   TONSILLECTOMY     TRANSTHORACIC ECHOCARDIOGRAM  08/20/2021   a) 05/21/2019: EF 60 to 65%.  No LVH.  Indeterminate LV filling.  No RWMA.  Normal RV.  Normal atria.  Normal aortic and mitral valves other than mild aortic sclerosis.  Normal RVSP and RAP.;; b) 08/20/2021:    SOCIAL HISTORY: Social History   Socioeconomic History   Marital status: Married    Spouse name: Not on file   Number of children: 2   Years of education: Not on file   Highest education level: Master's degree (e.g., MA, MS, MEng, MEd, MSW, MBA)  Occupational History   Occupation: retired    Comment: Airline pilot  Tobacco Use   Smoking status: Never   Smokeless tobacco: Never  Vaping Use   Vaping status: Never Used  Substance and Sexual Activity   Alcohol  use: Yes    Alcohol /week: 7.0 standard drinks of alcohol     Types: 7 Glasses of wine per week   Drug use: No   Sexual activity: Yes  Other Topics Concern   Not on file  Social History Narrative   Alm is a very pleasant married gentleman with 2 children Arleen and [place]) each with 3 children/total 6 grandchildren. He is currently retired now from his original job in [employer], however he still serves on [employer] at  [employer] and recently stepped down from [employer] at [employer].   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    For the last several months he has been off caffeine and wine because of GI upset symptoms and palpitations. Also trying to lose weight.     He used to drink maybe 2 glasses of wine at night, and he is subsequently all quit alcohol .      He is extremely active working out routinely doing squatting exercises and rides his bicycle or walks just about every day.  He does 300 push-ups to 3 times a week.  Over the summer he also enjoys kayaking (paddling anywhere from 45 minutes to 1-1/2 hours).   At the end of the summer, he actually did a 3-1/2-hour paddle kayaking around [address] where they have a beach house.  He did avoid going out into the open ocean to avoid the waves because he had no one paddling with him.  He did feel little bit tired after that and was worn out for the next day, but denied any chest pain or pressure.  No exertional dyspnea.       He and his wife spend Memorial Day to Labor Day at [employer] (Presumably on the [employer]).  He then returned to [employer] for the following winter.  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       He also was very fastidious on his diet.  He tries to cut down his caffeine intake because he had some palpitations off and on.  He says he is always high energy but does note a difference from when he was 83 years old compared to being 79.  He has had nothing like the symptoms he had prior to his CABG and is done remarkably well since his CABG.   Social Drivers of Corporate investment banker Strain: Not on file  Food Insecurity: No Food Insecurity (05/27/2024)   Hunger Vital Sign    Worried About Running Out of Food in the Last Year: Never true    Ran Out of Food in the Last Year: Never true  Transportation Needs: No  Transportation Needs (05/27/2024)   PRAPARE - Administrator, Civil Service (Medical): No    Lack of Transportation (Non-Medical):  No  Physical Activity: Not on file  Stress: Not on file  Social Connections: Unknown (06/07/2023)   [facility] from [facility]   Social Network    Social Network: Not on file  Intimate Partner Violence: Not At Risk (05/27/2024)   Humiliation, Afraid, Rape, and Kick questionnaire    Fear of Current or Ex-Partner: No    Emotionally Abused: No    Physically Abused: No    Sexually Abused: No    FAMILY HISTORY: Family History  Problem Relation Age of Onset   Heart failure Mother    Cancer Mother        breast   Coronary artery disease Father        CABG   Heart attack Father        Long-term smoker.   Hyperlipidemia Father    Hyperlipidemia Brother    Hypertension Brother    Hyperlipidemia Son    Healthy Daughter    Colon cancer Neg Hx    Colon polyps Neg Hx    Rectal cancer Neg Hx    Stomach cancer Neg Hx     ALLERGIES:  is allergic to caffeine, hydrocodone , nexium [esomeprazole], other, oxycodone , and crestor [rosuvastatin calcium ].  MEDICATIONS:  Current Outpatient Medications  Medication Sig Dispense Refill   aspirin  EC 81 MG tablet Take 81 mg by mouth at bedtime. (Patient taking differently: Take 81 mg by mouth at bedtime. Takes every other evening)     Ascorbic Acid (VITAMIN C) 1000 MG tablet Take 1,000 mg by mouth 2 (two) times daily.     B Complex Vitamins (B COMPLEX 1 PO) Take 1 capsule by mouth daily.     Cholecalciferol (VITAMIN D3 MAXIMUM STRENGTH) 125 MCG (5000 UT) capsule Take 5,000 Units by mouth at bedtime.     CHOLINE PO Take 800 mg by mouth 2 (two) times daily.     cyanocobalamin (VITAMIN B12) 1000 MCG tablet Take 1,000 mcg by mouth at bedtime.     EPINEPHrine  0.3 mg/0.3 mL IJ SOAJ injection Inject 0.3 mg into the muscle as needed for anaphylaxis.     ezetimibe  (ZETIA ) 10 MG tablet Take 10 mg by mouth daily.       FIBER PO Take 1 tablet by mouth 2 (two) times daily.     MAGNESIUM  GLYCINATE PO Take 1 tablet by mouth at bedtime.     Nutritional Supplements (KETO PO) Take 1 capsule by mouth daily. Medication is called Keto DHEA 7     OVER THE COUNTER MEDICATION Take 1 tablet by mouth 2 (two) times daily. Total Restor supplement     pantoprazole  (PROTONIX ) 40 MG tablet Take 40 mg by mouth daily.     pravastatin  (PRAVACHOL ) 20 MG tablet Take 20 mg by mouth at bedtime.     Probiotic Product (ALIGN) 4 MG CAPS Take 4 mg by mouth at bedtime.     tadalafil (ADCIRCA/CIALIS) 20 MG tablet Take 20 mg by mouth at bedtime.     Testosterone  25 MG/2.5GM (1%) GEL 1 mL by Other route See admin instructions. APPLY 4 CLICKS (1 ML) TO ALTERNATING SHOULDERS DAILY     valsartan (DIOVAN) 80 MG tablet Take 40 mg by mouth 2 (two) times daily.     Zinc 30 MG TABS Take 1 tablet by mouth daily.     No current facility-administered medications for this visit.    REVIEW OF SYSTEMS:   Constitutional: ( - ) fevers, ( - )  chills , ( - ) night  sweats Eyes: ( - ) blurriness of vision, ( - ) double vision, ( - ) watery eyes Ears, nose, mouth, throat, and face: ( - ) mucositis, ( - ) sore throat Respiratory: ( - ) cough, ( - ) dyspnea, ( - ) wheezes Cardiovascular: ( - ) palpitation, ( - ) chest discomfort, ( - ) lower extremity swelling Gastrointestinal:  ( - ) nausea, ( - ) heartburn, ( - ) change in bowel habits Skin: ( - ) abnormal skin rashes Lymphatics: ( - ) new lymphadenopathy, ( - ) easy bruising Neurological: ( - ) numbness, ( - ) tingling, ( - ) new weaknesses Behavioral/Psych: ( - ) mood change, ( - ) new changes  All other systems were reviewed with the patient and are negative.  PHYSICAL EXAMINATION:  Vitals:   05/27/24 1307  BP: 130/60  Pulse: (!) 54  Resp: 14  Temp: 97.9 F (36.6 C)  SpO2: 97%   Filed Weights   05/27/24 1307  Weight: 174 lb 6.4 oz (79.1 kg)    GENERAL: well appearing elderly Caucasian  male in NAD  SKIN: skin color, texture, turgor are normal, no rashes or significant lesions EYES: conjunctiva are pink and non-injected, sclera clear LUNGS: clear to auscultation and percussion with normal breathing effort HEART: regular rate & rhythm and no murmurs and no lower extremity edema Musculoskeletal: no cyanosis of digits and no clubbing  PSYCH: alert & oriented x 3, fluent speech NEURO: no focal motor/sensory deficits  LABORATORY DATA:  I have reviewed the data as listed    Latest Ref Rng & Units 05/27/2024    1:48 PM 03/03/2024    9:06 AM 10/17/2023    8:54 AM  CBC  WBC 4.0 - 10.5 K/uL 8.4  8.1  9.1   Hemoglobin 13.0 - 17.0 g/dL 88.6  89.0  88.2   Hematocrit 39.0 - 52.0 % 34.1  33.0  35.5   Platelets 150 - 400 K/uL 148  158  175        Latest Ref Rng & Units 05/27/2024    1:48 PM 10/17/2023    8:54 AM 05/28/2022    5:30 PM  CMP  Glucose 70 - 99 mg/dL 81  84  81   BUN 8 - 23 mg/dL 20  20  25    Creatinine 0.61 - 1.24 mg/dL 9.18  9.16  9.19   Sodium 135 - 145 mmol/L 143  138  139   Potassium 3.5 - 5.1 mmol/L 4.4  4.1  3.9   Chloride 98 - 111 mmol/L 108  105  104   CO2 22 - 32 mmol/L 30  26    Calcium  8.9 - 10.3 mg/dL 9.7  8.4    Total Protein 6.5 - 8.1 g/dL 7.1     Total Bilirubin 0.0 - 1.2 mg/dL 0.8     Alkaline Phos 38 - 126 U/L 102     AST 15 - 41 U/L 15     ALT 0 - 44 U/L 10        ASSESSMENT & PLAN Cowen Pesqueira Casseus 83 y.o. male with medical history significant for carotid stenosis, essential hypertension, GERD, hyperlipidemia, cardiac disease, migraines, and Mnire's disease who presents for evaluation of a monoclonal gammopathy.  After review of the labs, review of the records, and discussion with the patient the patients findings are most consistent with a IgM kappa monoclonal myopathy.  Monoclonal Gammopathies are a group of medical conditions defined by the presence of  a monoclonal protein (an M protein) in the blood or urine. Monoclonal  gammopathies include monoclonal gammopathy of unknown significance (MGUS), Monoclonal gammopathies of renal or neurological significance,  smoldering multiple myeloma (SMM), multiple myeloma (MM), AL amyloidosis, and Waldenstrom macroglobulinemia. The goal of the initial workup is to determine which monoclonal gammopathy a patient has. The workup consists of evaluating protein in the serum (with serum protein electrophoresis (SPEP) and serum free light chains) , evaluating protein in the urine (UPEP), and evaluation of the skeleton (DG Bone Met Survey) to assure no lytic lesions. Baseline bloodwork includes CMP and CBC. If no CRAB criteria or high risk criteria are noted then the diagnosis is MGUS. MGUS must be followed with bloodwork periodically to assure it does not convert to multiple myeloma (occurs to approximately 1% of patients per year). If there are CRAB criteria or high risk features (such as elevated serum free light chain ratio (taking into account renal function), a non IgG M protein, or M protein >1.5) then a bone marrow biopsy must be pursued.    #IgM Kappa Monoclonal Gammopathy of Undetermined Significance --today will order an SPEP, UPEP, SFLC and beta 2 microglobulin --additionally will collect new baseline CBC, CMP, and LDH --recommend a metastatic bone survey to assess for lytic lesions --based on prior results patient will required a bone marrow biopsy.  --RTC in 6 months or sooner if intervention is required.   #Splenomegaly -- Patient has a splenomegaly of unclear etiology with his spleen measuring up to 20 cm. -- Will await the results of the bone marrow biopsy.  If no evidence of a plasma cell disorder would recommend ordering a CT chest abdomen pelvis to assess for lymphadenopathy elsewhere. -- Further workup of the splenomegaly pending the IgM kappa MGUS workup above.   Orders Placed This Encounter  Procedures   DG Bone Survey Met    Standing Status:   Future     Expected Date:   06/03/2024    Expiration Date:   05/27/2025    Reason for Exam (SYMPTOM  OR DIAGNOSIS REQUIRED):   MGUS, please assess for lytic lesions    Preferred imaging location?:   [facility]   CT BONE MARROW BIOPSY & ASPIRATION    Standing Status:   Future    Expected Date:   06/03/2024    Expiration Date:   05/27/2025    Reason for Exam (SYMPTOM  OR DIAGNOSIS REQUIRED):   requesting bone marrow biopsy for high risk MGUS    Preferred location?:   [facility]   CBC with Differential (Cancer Center Only)    Standing Status:   Future    Number of Occurrences:   1    Expiration Date:   05/27/2025   CMP (Cancer Center only)    Standing Status:   Future    Number of Occurrences:   1    Expiration Date:   05/27/2025   Lactate dehydrogenase (LDH)    Standing Status:   Future    Number of Occurrences:   1    Expiration Date:   05/27/2025   Multiple Myeloma Panel (SPEP&IFE w/QIG)    Standing Status:   Future    Number of Occurrences:   1    Expiration Date:   05/27/2025   Kappa/lambda light chains    Standing Status:   Future    Number of Occurrences:   1    Expiration Date:   05/27/2025   24-Hr Ur UPEP/UIFE/Light Chains/TP  Standing Status:   Future    Expiration Date:   05/27/2025   Beta 2 microglobulin    Standing Status:   Future    Number of Occurrences:   1    Expiration Date:   05/27/2025    All questions were answered. The patient knows to call the clinic with any problems, questions or concerns.  A total of more than 60 minutes were spent on this encounter with face-to-face time and non-face-to-face time, including preparing to see the patient, ordering tests and/or medications, counseling the patient and coordination of care as outlined above.   Norleen IVAR Kidney, MD Department of Hematology/Oncology [facility] Phone: [phone] Pager: [phone] Email: [email or web address]  06/02/2024 4:09 PM

## 2024-05-28 LAB — KAPPA/LAMBDA LIGHT CHAINS
Kappa free light chain: 30.5 mg/L — ABNORMAL HIGH (ref 3.3–19.4)
Kappa, lambda light chain ratio: 4.18 — ABNORMAL HIGH (ref 0.26–1.65)
Lambda free light chains: 7.3 mg/L (ref 5.7–26.3)

## 2024-05-28 LAB — BETA 2 MICROGLOBULIN, SERUM: Beta-2 Microglobulin: 3.8 mg/L — ABNORMAL HIGH (ref 0.6–2.4)

## 2024-05-30 LAB — MULTIPLE MYELOMA PANEL, SERUM
Albumin SerPl Elph-Mcnc: 3.6 g/dL (ref 2.9–4.4)
Albumin/Glob SerPl: 1.3 (ref 0.7–1.7)
Alpha 1: 0.3 g/dL (ref 0.0–0.4)
Alpha2 Glob SerPl Elph-Mcnc: 0.8 g/dL (ref 0.4–1.0)
B-Globulin SerPl Elph-Mcnc: 0.8 g/dL (ref 0.7–1.3)
Gamma Glob SerPl Elph-Mcnc: 0.9 g/dL (ref 0.4–1.8)
Globulin, Total: 2.9 g/dL (ref 2.2–3.9)
IgA: 28 mg/dL — ABNORMAL LOW (ref 61–437)
IgG (Immunoglobin G), Serum: 684 mg/dL (ref 603–1613)
IgM (Immunoglobulin M), Srm: 752 mg/dL — ABNORMAL HIGH (ref 15–143)
M Protein SerPl Elph-Mcnc: 0.3 g/dL — ABNORMAL HIGH
Total Protein ELP: 6.5 g/dL (ref 6.0–8.5)

## 2024-06-03 ENCOUNTER — Ambulatory Visit (HOSPITAL_COMMUNITY)
Admission: RE | Admit: 2024-06-03 | Discharge: 2024-06-03 | Disposition: A | Discharge status: Home / Self Care | Source: Ambulatory Visit | Attending: Hematology and Oncology | Admitting: Hematology and Oncology

## 2024-06-03 DIAGNOSIS — M47819 Spondylosis without myelopathy or radiculopathy, site unspecified: Secondary | ICD-10-CM | POA: Diagnosis not present

## 2024-06-03 DIAGNOSIS — D472 Monoclonal gammopathy: Secondary | ICD-10-CM | POA: Insufficient documentation

## 2024-06-04 ENCOUNTER — Other Ambulatory Visit: Payer: Self-pay | Admitting: *Deleted

## 2024-06-04 DIAGNOSIS — I519 Heart disease, unspecified: Secondary | ICD-10-CM | POA: Diagnosis not present

## 2024-06-04 DIAGNOSIS — D472 Monoclonal gammopathy: Secondary | ICD-10-CM | POA: Diagnosis not present

## 2024-06-04 DIAGNOSIS — Z79899 Other long term (current) drug therapy: Secondary | ICD-10-CM | POA: Diagnosis not present

## 2024-06-04 DIAGNOSIS — R161 Splenomegaly, not elsewhere classified: Secondary | ICD-10-CM | POA: Diagnosis not present

## 2024-06-04 DIAGNOSIS — I119 Hypertensive heart disease without heart failure: Secondary | ICD-10-CM | POA: Diagnosis not present

## 2024-06-04 DIAGNOSIS — K219 Gastro-esophageal reflux disease without esophagitis: Secondary | ICD-10-CM | POA: Diagnosis not present

## 2024-06-08 LAB — UPEP/UIFE/LIGHT CHAINS/TP, 24-HR UR
% BETA, Urine: 24.9 %
ALPHA 1 URINE: 6.3 %
Albumin, U: 37.2 %
Alpha 2, Urine: 16.7 %
Free Kappa Lt Chains,Ur: 10.25 mg/L (ref 1.17–86.46)
Free Kappa/Lambda Ratio: 4.77 (ref 1.83–14.26)
Free Lambda Lt Chains,Ur: 2.15 mg/L (ref 0.27–15.21)
GAMMA GLOBULIN URINE: 14.9 %
Total Protein, Urine-Ur/day: 100 mg/(24.h) (ref 30–150)
Total Protein, Urine: 10 mg/dL
Total Volume: 1000

## 2024-06-11 ENCOUNTER — Encounter

## 2024-06-12 ENCOUNTER — Ambulatory Visit: Attending: Cardiology

## 2024-06-12 DIAGNOSIS — I639 Cerebral infarction, unspecified: Secondary | ICD-10-CM

## 2024-06-12 LAB — CUP PACEART REMOTE DEVICE CHECK
Date Time Interrogation Session: 20251023231740
Implantable Pulse Generator Implant Date: 20201216

## 2024-06-16 NOTE — Progress Notes (Signed)
 Remote Loop Recorder Transmission

## 2024-06-17 ENCOUNTER — Ambulatory Visit: Payer: Self-pay | Admitting: Cardiology

## 2024-06-24 ENCOUNTER — Other Ambulatory Visit: Payer: Self-pay | Admitting: Radiology

## 2024-06-24 DIAGNOSIS — D472 Monoclonal gammopathy: Secondary | ICD-10-CM

## 2024-06-24 NOTE — H&P (Signed)
 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  Recent bone scan shows small lucencies within the proximal right humerus, distal right femur and proximal right tibia.  He is scheduled today for image guided bone marrow biopsy for further evaluation.  *** Patient is Full Code  Past Medical History:  Diagnosis Date   Carotid stenosis, asymptomatic 07/27/2011   Cryptogenic stroke (HCC) 04/2019   Minimal carotid disease, normal TTE/TEE; LINQ ILR Placed -> no documented A-fib   DDD (degenerative disc disease)    ED (erectile dysfunction)    Essential hypertension 01/29/2017   GERD (gastroesophageal reflux disease)    History of hiatal hernia    History of kidney stones    passed stones - no surgery required   Hyperlipidemia with target low density lipoprotein (LDL) cholesterol less than 70 mg/dL 91/87/7984   Impaired fasting glucose    Knee pain    bilateral   Left main coronary artery disease 03/18/2017   Cardiac Cath: 55% dLM, tandem p-mLAD 75% --> referred for CABG.   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 Lucas @ [facility]):  LIMA-LAD, SVG-D2, SVG-OM, SVG-PDA (endoscopic SVG  harvesting from right leg).   Shoulder pain    Wears hearing aid     Past Surgical History:  Procedure Laterality Date   APPENDECTOMY     2020   BUBBLE STUDY  08/05/2019   Procedure: BUBBLE STUDY;  Surgeon: Lonni Slain, MD;  Location: [facility] ENDOSCOPY;  Service: Cardiovascular;;   Cardiac Stress Test  2008   Normal   CARPAL TUNNEL RELEASE Left 1998   CARPAL TUNNEL RELEASE Right 2008   CHOLECYSTECTOMY, LAPAROSCOPIC  2021   in [place]   COLONOSCOPY  06/2013   [place] - polyps   CORONARY ARTERY BYPASS GRAFT N/A 04/04/2017   Procedure: CORONARY ARTERY BYPASS GRAFTING x 4  LIMA-LAD, SVG-D2, SVG-OM, SVG-PDA (endoscopic SVG harvesting from right leg);  Surgeon: Lucas Dorise POUR, MD;  Location: [facility] OR;  Service: Open Heart Surgery;  Laterality: N/A;   EXTRACORPOREAL SHOCK WAVE LITHOTRIPSY Right 10/23/2018   Procedure: EXTRACORPOREAL SHOCK WAVE LITHOTRIPSY (ESWL);  Surgeon: Gaston Hamilton, MD;  Location: [facility] ORS;  Service: Urology;  Laterality: Right;   HERNIA REPAIR Left    inguinal   HIATAL HERNIA REPAIR     no surgery per pt   INCISION AND DRAINAGE WOUND WITH NAILBED REPAIR Left 05/28/2022   Procedure: Irrigation and debridement and open repair bone nail plate nailbed and soft tissue structures;  Surgeon: Camella Fallow, MD;  Location: [facility] OR;  Service: Orthopedics;  Laterality: Left;  1 hr Block with IV Sedation   KNEE CARTILAGE SURGERY Bilateral    LEFT HEART CATH AND CORONARY ANGIOGRAPHY N/A 03/18/2017   Procedure: Left Heart Cath and Coronary Angiography;  Surgeon: Anner Alm  W, MD;  Location: [facility] INVASIVE CV LAB;  Service: Cardiovascular:  55% dLM, p-mLAD 75% - mLAD 75%.  EF 55-65%. Mod non-obstructive RCA disease --> referred for CABG   LOOP RECORDER INSERTION N/A 08/05/2019   Procedure: LOOP RECORDER INSERTION;  Surgeon: Inocencio Soyla Lunger, MD;  Location: [facility] INVASIVE CV LAB;  Service: Cardiovascular;  Laterality: N/A;   MEDIAL PARTIAL KNEE REPLACEMENT Right 2010    Partial right knee replacement   NM MYOVIEW  LTD  01/2017   INTERMEDIATE RISK: Exercise tolerance was good at 10 minutes, however, fatigue and dyspnea were reporte-d -> hypertensive response to exercise.  3mm Horizontal ST depressions noted during stress in the II, III, aVF, V6, V5 and V4 leads c/w ischemia.   Small siize, moderate severity perfusion defect  mid to distal anterior wall perfusion defect suggestive of ischemia..    NM MYOVIEW  LTD  05/07/2022   Treadmill Myoview : Average exercise capacity (7:00 min:s; 8.5 METS). Normal HR response to exercise. Hypertensive response to exercise.  EF 55 to 65% (60%).  Normal size and function.  NO EVIDENCE OF ISCHEMIA OR INFARCTION -> NORMAL/LOW RISK   PARTIAL KNEE ARTHROPLASTY Left 10/24/2023   Procedure: ARTHROPLASTY, KNEE, UNICOMPARTMENTAL;  Surgeon: Ernie Cough, MD;  Location: [facility] ORS;  Service: Orthopedics;  Laterality: Left;   ROTATOR CUFF REPAIR Left 2008   SHOULDER ARTHROSCOPY W/ ROTATOR CUFF REPAIR Right 06/15/2016   TEE WITHOUT CARDIOVERSION N/A 04/04/2017   Procedure: TRANSESOPHAGEAL ECHOCARDIOGRAM (TEE);  Surgeon: Lucas Dorise POUR, MD;  Location: [facility] OR;  Service: Open Heart Surgery;  Laterality: N/A;   TEE WITHOUT CARDIOVERSION N/A 08/05/2019   Procedure: TRANSESOPHAGEAL ECHOCARDIOGRAM (TEE);  Surgeon: Lonni Slain, MD;  Location: [facility] ENDOSCOPY:: EF 60 to 65%, no RWMA.  Normal atria, normal RV.  Normal valves.  No vegetation noted.  No LAA thrombus.  Negative bubble study.  Mild-diffuse aortic atherosclerosis   TONSILLECTOMY     TRANSTHORACIC ECHOCARDIOGRAM  08/20/2021   a) 05/21/2019: EF 60 to 65%.  No LVH.  Indeterminate LV filling.  No RWMA.  Normal RV.  Normal atria.  Normal aortic and mitral valves other than mild aortic sclerosis.  Normal RVSP and RAP.;; b) 08/20/2021:    Allergies: Caffeine, Hydrocodone , Nexium [esomeprazole], Other, Oxycodone , and Crestor [rosuvastatin calcium ]  Medications: Prior to Admission medications    Medication Sig Start Date End Date Taking? Authorizing Provider  Ascorbic Acid (VITAMIN C) 1000 MG tablet Take 1,000 mg by mouth 2 (two) times daily.    [provider]  aspirin  EC 81 MG tablet Take 81 mg by mouth at bedtime. Patient taking differently: Take 81 mg by mouth at bedtime. Takes every other evening    [provider]  B Complex Vitamins (B COMPLEX 1 PO) Take 1 capsule by mouth daily.    [provider]  Cholecalciferol (VITAMIN D3 MAXIMUM STRENGTH) 125 MCG (5000 UT) capsule Take 5,000 Units by mouth at bedtime.    [provider]  CHOLINE PO Take 800 mg by mouth 2 (two) times daily.    [provider]  cyanocobalamin (VITAMIN B12) 1000 MCG tablet Take 1,000 mcg by mouth at bedtime.    [provider]  EPINEPHrine  0.3 mg/0.3 mL IJ SOAJ injection Inject 0.3 mg into the muscle as needed for anaphylaxis. 05/06/19   [provider]  ezetimibe  (ZETIA ) 10 MG tablet Take 10 mg by mouth daily.     [provider]  FIBER PO Take 1 tablet by mouth 2 (two) times daily.  [provider]  MAGNESIUM  GLYCINATE PO Take 1 tablet by mouth at bedtime.    [provider]  Nutritional Supplements (KETO PO) Take 1 capsule by mouth daily. Medication is called Keto DHEA 7    [provider]  OVER THE COUNTER MEDICATION Take 1 tablet by mouth 2 (two) times daily. Total Restor supplement    [provider]  pantoprazole  (PROTONIX ) 40 MG tablet Take 40 mg by mouth daily. 08/05/21   [provider]  pravastatin  (PRAVACHOL ) 20 MG tablet Take 20 mg by mouth at bedtime. 10/18/18   [provider]  Probiotic Product (ALIGN) 4 MG CAPS Take 4 mg by mouth at bedtime.    [provider]  tadalafil (ADCIRCA/CIALIS) 20 MG tablet Take 20 mg by mouth at bedtime.    [provider]  Testosterone  25 MG/2.5GM (1%) GEL 1 mL by Other route See admin instructions. APPLY 4 CLICKS (1 ML) TO  ALTERNATING SHOULDERS DAILY 02/22/22   [provider]  valsartan (DIOVAN) 80 MG tablet Take 40 mg by mouth 2 (two) times daily. 03/21/20   [provider]  Zinc 30 MG TABS Take 1 tablet by mouth daily.    [provider]     Family History  Problem Relation Age of Onset   Heart failure Mother    Cancer Mother        breast   Coronary artery disease Father        CABG   Heart attack Father        Long-term smoker.   Hyperlipidemia Father    Hyperlipidemia Brother    Hypertension Brother    Hyperlipidemia Son    Healthy Daughter    Colon cancer Neg Hx    Colon polyps Neg Hx    Rectal cancer Neg Hx    Stomach cancer Neg Hx     Social History   Socioeconomic History   Marital status: Married    Spouse name: Not on file   Number of children: 2   Years of education: Not on file   Highest education level: Master's degree (e.g., MA, MS, MEng, MEd, MSW, MBA)  Occupational History   Occupation: retired    Comment: airline pilot  Tobacco Use   Smoking status: Never   Smokeless tobacco: Never  Vaping Use   Vaping status: Never Used  Substance and Sexual Activity   Alcohol  use: Yes    Alcohol /week: 7.0 standard drinks of alcohol     Types: 7 Glasses of wine per week   Drug use: No   Sexual activity: Yes  Other Topics Concern   Not on file  Social History Narrative   Alm is a very pleasant married gentleman with 2 children Arleen and Mooar) each with 3 children/total 6 grandchildren. He is currently retired now from his original job in [employer], however he still serves on [employer] at [employer] and recently stepped down from [employer] at [employer].   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    For the last several months he has been off caffeine and wine because of GI upset symptoms and palpitations. Also trying to lose weight.     He used to drink maybe 2 glasses of wine at night, and he is  subsequently all quit alcohol .      He is extremely active working out routinely doing squatting exercises and rides his bicycle or walks just  about every day.  He does 300 push-ups to 3 times a week.  Over the summer he also enjoys kayaking (paddling anywhere from 45 minutes to 1-1/2 hours).   At the end of the summer, he actually did a 3-1/2-hour paddle kayaking around [id number] where they have a beach house.  He did avoid going out into the open ocean to avoid the waves because he had no one paddling with him.  He did feel little bit tired after that and was worn out for the next day, but denied any chest pain or pressure.  No exertional dyspnea.       He and his wife spend Memorial Day to [place] at [employer] ([place]umably on the [employer]).  He then returned to [facility] for the following winter.  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       He also was very fastidious on his diet.  He tries to cut down his caffeine intake because he had some palpitations off and on.  He says he is always high energy but does note a difference from when he was 83 years old compared to being 71.  He has had nothing like the symptoms he had prior to his CABG and is done remarkably well since his CABG.   Social Drivers of Corporate Investment Banker Strain: Not on file  Food Insecurity: No Food Insecurity (05/27/2024)   Hunger Vital Sign    Worried About Running Out of Food in the Last Year: Never true    Ran Out of Food in the Last Year: Never true  Transportation Needs: No Transportation Needs (05/27/2024)   PRAPARE - Administrator, Civil Service (Medical): No    Lack of Transportation (Non-Medical): No  Physical Activity: Not on file  Stress: Not on file  Social Connections: Unknown (06/07/2023)   Received from [facility]   Social Network    Social Network: Not on file       Review of Systems  Vital Signs:   Advance Care  Plan: No documents on file    Physical Exam  Imaging: CUP PACEART REMOTE DEVICE CHECK Result Date: 06/12/2024 ILR summary report received. Battery status OK. Normal device function. No new symptom, tachy, brady, or pause episodes. No new AF episodes. Monthly summary reports and ROV/PRN - CS, CVRS  DG Bone Survey Met Result Date: 06/07/2024 CLINICAL DATA:  MGUS, please assess for lytic lesions EXAM: METASTATIC BONE SURVEY COMPARISON:  02/25/2024 FINDINGS: Lateral skull: No acute or destructive bony abnormalities. Upper extremities: Frontal views from the shoulders through the wrists are obtained. There is an 8 mm lucency within the proximal diaphysis of the right humerus. No other acute or destructive bony abnormalities. Osteoarthritis of the bilateral shoulders. Spine: Frontal and lateral views of the spine demonstrate multilevel spondylosis throughout the entirety of the spine. Severe facet hypertrophy within the mid to lower lumbar spine. There are no acute or destructive bony abnormalities. Chest: Single frontal view demonstrates an unremarkable cardiac silhouette. The lungs are clear. There are no acute or destructive bony abnormalities. Pelvis: There are no acute or destructive bony abnormalities. Mild symmetrical bilateral hip osteoarthritis. Lower extremities: Frontal views from the hips through the ankles are obtained. Small focal lucencies are seen within the right femoral distal diaphysis and right tibial proximal diaphysis. No other acute or destructive bony abnormalities. Medial compartmental arthroplasties are seen within the bilateral knees. IMPRESSION:  1. Small lucencies within the proximal right humerus, distal right femur, and proximal right tibia as above. 2. No other acute or destructive bony abnormalities. 3. Multifocal degenerative changes involving the shoulders, hips, knees, and spine. Electronically Signed   By: Ozell Daring M.D.   On: 06/07/2024 15:03     Labs:  CBC: Recent Labs    10/17/23 0854 03/03/24 0906 05/27/24 1348  WBC 9.1 8.1 8.4  HGB 11.7* 10.9* 11.3*  HCT 35.5* 33.0* 34.1*  PLT 175 158 148*    COAGS: No results for input(s): INR, APTT in the last 8760 hours.  BMP: Recent Labs    10/17/23 0854 05/27/24 1348  NA 138 143  K 4.1 4.4  CL 105 108  CO2 26 30  GLUCOSE 84 81  BUN 20 20  CALCIUM  8.4* 9.7  CREATININE 0.83 0.81  GFRNONAA >60 >60    LIVER FUNCTION TESTS: Recent Labs    05/27/24 1348  BILITOT 0.8  AST 15  ALT 10  ALKPHOS 102  PROT 7.1  ALBUMIN  4.1    TUMOR MARKERS: No results for input(s): AFPTM, CEA, CA199, CHROMGRNA in the last 8760 hours.  Assessment and Plan: 83 y.o. 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  Recent bone scan shows small lucencies within the proximal right humerus, distal right femur and proximal right tibia.  He is scheduled today for image guided bone marrow biopsy for further evaluation.Risks and benefits of procedure was discussed with the patient and/or patient's family including, but not limited to bleeding, infection, damage to adjacent structures or low yield requiring additional tests.  All of the questions were answered and there is agreement to proceed.  Consent signed and in chart.    Thank you for allowing our service to participate in Jordan [name] 's care.  Electronically Signed: D. Franky Rakers, PA-C   06/24/2024, 3:49 PM      I spent a total of  20 minutes   in face to face in clinical consultation, greater than 50% of which was counseling/coordinating care for image guided bone  marrow biopsy

## 2024-06-25 ENCOUNTER — Other Ambulatory Visit: Payer: Self-pay

## 2024-06-25 ENCOUNTER — Ambulatory Visit (HOSPITAL_COMMUNITY)
Admission: RE | Admit: 2024-06-25 | Discharge: 2024-06-25 | Disposition: A | Discharge status: Home / Self Care | Source: Ambulatory Visit | Attending: Hematology and Oncology | Admitting: Hematology and Oncology

## 2024-06-25 ENCOUNTER — Encounter (HOSPITAL_COMMUNITY): Payer: Self-pay

## 2024-06-25 DIAGNOSIS — D472 Monoclonal gammopathy: Secondary | ICD-10-CM | POA: Insufficient documentation

## 2024-06-25 DIAGNOSIS — K219 Gastro-esophageal reflux disease without esophagitis: Secondary | ICD-10-CM | POA: Diagnosis not present

## 2024-06-25 DIAGNOSIS — I10 Essential (primary) hypertension: Secondary | ICD-10-CM | POA: Diagnosis not present

## 2024-06-25 DIAGNOSIS — K449 Diaphragmatic hernia without obstruction or gangrene: Secondary | ICD-10-CM | POA: Diagnosis not present

## 2024-06-25 DIAGNOSIS — Z87442 Personal history of urinary calculi: Secondary | ICD-10-CM | POA: Insufficient documentation

## 2024-06-25 DIAGNOSIS — D72820 Lymphocytosis (symptomatic): Secondary | ICD-10-CM | POA: Insufficient documentation

## 2024-06-25 DIAGNOSIS — I6523 Occlusion and stenosis of bilateral carotid arteries: Secondary | ICD-10-CM | POA: Insufficient documentation

## 2024-06-25 DIAGNOSIS — I2510 Atherosclerotic heart disease of native coronary artery without angina pectoris: Secondary | ICD-10-CM | POA: Diagnosis not present

## 2024-06-25 DIAGNOSIS — Z951 Presence of aortocoronary bypass graft: Secondary | ICD-10-CM | POA: Insufficient documentation

## 2024-06-25 DIAGNOSIS — Z8673 Personal history of transient ischemic attack (TIA), and cerebral infarction without residual deficits: Secondary | ICD-10-CM | POA: Insufficient documentation

## 2024-06-25 DIAGNOSIS — Z1379 Encounter for other screening for genetic and chromosomal anomalies: Secondary | ICD-10-CM | POA: Diagnosis not present

## 2024-06-25 DIAGNOSIS — H8109 Meniere's disease, unspecified ear: Secondary | ICD-10-CM | POA: Diagnosis not present

## 2024-06-25 LAB — CBC WITH DIFFERENTIAL/PLATELET
Abs Immature Granulocytes: 0.02 K/uL (ref 0.00–0.07)
Basophils Absolute: 0 K/uL (ref 0.0–0.1)
Basophils Relative: 0 %
Eosinophils Absolute: 0.1 K/uL (ref 0.0–0.5)
Eosinophils Relative: 1 %
HCT: 33.2 % — ABNORMAL LOW (ref 39.0–52.0)
Hemoglobin: 10.3 g/dL — ABNORMAL LOW (ref 13.0–17.0)
Immature Granulocytes: 0 %
Lymphocytes Relative: 66 %
Lymphs Abs: 5.6 K/uL — ABNORMAL HIGH (ref 0.7–4.0)
MCH: 28.1 pg (ref 26.0–34.0)
MCHC: 31 g/dL (ref 30.0–36.0)
MCV: 90.7 fL (ref 80.0–100.0)
Monocytes Absolute: 0.7 K/uL (ref 0.1–1.0)
Monocytes Relative: 8 %
Neutro Abs: 2.1 K/uL (ref 1.7–7.7)
Neutrophils Relative %: 25 %
Platelets: 127 K/uL — ABNORMAL LOW (ref 150–400)
RBC: 3.66 MIL/uL — ABNORMAL LOW (ref 4.22–5.81)
RDW: 17.2 % — ABNORMAL HIGH (ref 11.5–15.5)
Smear Review: NORMAL
WBC: 8.6 K/uL (ref 4.0–10.5)
nRBC: 0 % (ref 0.0–0.2)

## 2024-06-25 MED ORDER — SODIUM CHLORIDE 0.9 % IV SOLN: INTRAVENOUS | Status: DC

## 2024-06-25 MED ORDER — MIDAZOLAM HCL 2 MG/2ML IJ SOLN
INTRAMUSCULAR | Status: AC
Start: 2024-06-25 — End: 2024-06-25
  Filled 2024-06-25: qty 2

## 2024-06-25 MED ORDER — FENTANYL CITRATE (PF) 100 MCG/2ML IJ SOLN
INTRAMUSCULAR | Status: AC | PRN
  Administered 2024-06-25 (×2): 50 ug via INTRAVENOUS

## 2024-06-25 MED ORDER — MIDAZOLAM HCL (PF) 2 MG/2ML IJ SOLN
INTRAMUSCULAR | Status: AC | PRN
  Administered 2024-06-25 (×2): 1 mg via INTRAVENOUS

## 2024-06-25 MED ORDER — FENTANYL CITRATE (PF) 100 MCG/2ML IJ SOLN
INTRAMUSCULAR | Status: AC
  Filled 2024-06-25: qty 2

## 2024-06-25 NOTE — Procedures (Signed)
 Interventional Radiology Procedure Note  Procedure: CT Guided Biopsy of bone marrow  Complications: None  Estimated Blood Loss: < 10 mL  Findings: 13 G core biopsy of right iliac performed under CT guidance.  Aspirate and core samples obtained and sent to Pathology.  Cordella DELENA Banner, MD

## 2024-06-29 LAB — SURGICAL PATHOLOGY

## 2024-07-03 ENCOUNTER — Inpatient Hospital Stay: Attending: Hematology and Oncology

## 2024-07-03 ENCOUNTER — Other Ambulatory Visit: Payer: Self-pay | Admitting: Hematology and Oncology

## 2024-07-03 ENCOUNTER — Inpatient Hospital Stay: Admitting: Hematology and Oncology

## 2024-07-03 VITALS — BP 126/55 | HR 62 | Temp 98.2°F | Resp 13 | Wt 177.3 lb

## 2024-07-03 DIAGNOSIS — Z79899 Other long term (current) drug therapy: Secondary | ICD-10-CM | POA: Insufficient documentation

## 2024-07-03 DIAGNOSIS — C8299 Follicular lymphoma, unspecified, extranodal and solid organ sites: Secondary | ICD-10-CM

## 2024-07-03 DIAGNOSIS — D472 Monoclonal gammopathy: Secondary | ICD-10-CM | POA: Insufficient documentation

## 2024-07-03 DIAGNOSIS — Z803 Family history of malignant neoplasm of breast: Secondary | ICD-10-CM | POA: Insufficient documentation

## 2024-07-03 LAB — CBC WITH DIFFERENTIAL (CANCER CENTER ONLY)
Abs Immature Granulocytes: 0.02 K/uL (ref 0.00–0.07)
Basophils Absolute: 0 K/uL (ref 0.0–0.1)
Basophils Relative: 0 %
Eosinophils Absolute: 0.1 K/uL (ref 0.0–0.5)
Eosinophils Relative: 2 %
HCT: 33.5 % — ABNORMAL LOW (ref 39.0–52.0)
Hemoglobin: 10.8 g/dL — ABNORMAL LOW (ref 13.0–17.0)
Immature Granulocytes: 0 %
Lymphocytes Relative: 63 %
Lymphs Abs: 5.1 K/uL — ABNORMAL HIGH (ref 0.7–4.0)
MCH: 28.6 pg (ref 26.0–34.0)
MCHC: 32.2 g/dL (ref 30.0–36.0)
MCV: 88.9 fL (ref 80.0–100.0)
Monocytes Absolute: 0.8 K/uL (ref 0.1–1.0)
Monocytes Relative: 10 %
Neutro Abs: 2 K/uL (ref 1.7–7.7)
Neutrophils Relative %: 25 %
Platelet Count: 132 K/uL — ABNORMAL LOW (ref 150–400)
RBC: 3.77 MIL/uL — ABNORMAL LOW (ref 4.22–5.81)
RDW: 17.2 % — ABNORMAL HIGH (ref 11.5–15.5)
Smear Review: NORMAL
WBC Count: 8.2 K/uL (ref 4.0–10.5)
nRBC: 0 % (ref 0.0–0.2)

## 2024-07-03 LAB — CMP (CANCER CENTER ONLY)
ALT: 9 U/L (ref 0–44)
AST: 17 U/L (ref 15–41)
Albumin: 3.9 g/dL (ref 3.5–5.0)
Alkaline Phosphatase: 90 U/L (ref 38–126)
Anion gap: 7 (ref 5–15)
BUN: 19 mg/dL (ref 8–23)
CO2: 27 mmol/L (ref 22–32)
Calcium: 9.2 mg/dL (ref 8.9–10.3)
Chloride: 108 mmol/L (ref 98–111)
Creatinine: 0.98 mg/dL (ref 0.61–1.24)
GFR, Estimated: >60 mL/min (ref >60)
Glucose, Bld: 132 mg/dL — ABNORMAL HIGH (ref 70–99)
Potassium: 4.1 mmol/L (ref 3.5–5.1)
Sodium: 142 mmol/L (ref 135–145)
Total Bilirubin: 0.9 mg/dL (ref 0.0–1.2)
Total Protein: 6.5 g/dL (ref 6.5–8.1)

## 2024-07-03 NOTE — Progress Notes (Signed)
 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 Federico  06/03/2024: DG bone met survey showed small lucencies within the proximal right humerus, distal right femur, and proximal right tibia  06/25/2024: CT bone marrow biopsy showed 70% cellular bone marrow with 30% lymphoplasmacytic mature B cells   Interval History:  Jordan [name] 83 y.o. male with medical history significant for a B-cell lymphoma who presents for a follow up visit. The patient's last visit was on 05/27/2024 at which time he establish care. In the interim since the last visit he is undergone bone marrow biopsy and bone met survey which appear to suggest the patient has a lymphoma producing the M protein.  On exam today Mr. Azucena is accompanied by his wife.  She reports that he is not having any pain or bruising after the bone marrow biopsy.  He reports he tolerated the procedure well.  He reports that he works out quite vigorously and does 300 push-ups in the morning.  He reports he has been having more shortness of breath however with aerobic exercises such as bike riding and walking uphill.  He reports overall he feels well with no fevers, chills, sweats, nausea, vomiting or diarrhea.  He is not having any bleeding or bruising anywhere.  Full 10 point ROS is otherwise negative.  The bulk of our discussion focused on the results of his  interesting workup.  Details of the conversation are noted below.  MEDICAL HISTORY:  Past Medical History:  Diagnosis Date   Carotid stenosis, asymptomatic 07/27/2011   Cryptogenic stroke (HCC) 04/2019   Minimal carotid disease, normal TTE/TEE; LINQ ILR Placed -> no documented A-fib   DDD (degenerative disc disease)    ED (erectile dysfunction)    Essential hypertension 01/29/2017   GERD (gastroesophageal reflux disease)    History of hiatal hernia    History of kidney stones    passed stones - no surgery required   Hyperlipidemia with target low density lipoprotein (LDL) cholesterol less than 70 mg/dL 91/87/7984   Impaired fasting glucose    Knee pain    bilateral   Left main coronary artery disease 03/18/2017   Cardiac Cath: 55% dLM, tandem p-mLAD 75% --> referred for CABG.   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 Lucas @ [facility]):  LIMA-LAD, SVG-D2, SVG-OM, SVG-PDA (endoscopic SVG harvesting from right leg).   Shoulder pain    Wears hearing aid     SURGICAL HISTORY: Past Surgical History:  Procedure Laterality Date   APPENDECTOMY     2020   BUBBLE STUDY  08/05/2019   Procedure: BUBBLE STUDY;  Surgeon: Lonni Slain, MD;  Location: [facility] ENDOSCOPY;  Service: Cardiovascular;;   Cardiac Stress Test  2008   Normal   CARPAL TUNNEL RELEASE Left 1998   CARPAL TUNNEL  RELEASE Right 2008   CHOLECYSTECTOMY, LAPAROSCOPIC  2021   in [place]   COLONOSCOPY  06/2013   [place] - polyps   CORONARY ARTERY BYPASS GRAFT N/A 04/04/2017   Procedure: CORONARY ARTERY BYPASS GRAFTING x 4  LIMA-LAD, SVG-D2, SVG-OM, SVG-PDA (endoscopic SVG harvesting from right leg);  Surgeon: Lucas Dorise POUR, MD;  Location: [facility] OR;  Service: Open Heart Surgery;  Laterality: N/A;   EXTRACORPOREAL SHOCK WAVE LITHOTRIPSY Right 10/23/2018   Procedure:  EXTRACORPOREAL SHOCK WAVE LITHOTRIPSY (ESWL);  Surgeon: Gaston Hamilton, MD;  Location: [facility] ORS;  Service: Urology;  Laterality: Right;   HERNIA REPAIR Left    inguinal   HIATAL HERNIA REPAIR     no surgery per pt   INCISION AND DRAINAGE WOUND WITH NAILBED REPAIR Left 05/28/2022   Procedure: Irrigation and debridement and open repair bone nail plate nailbed and soft tissue structures;  Surgeon: Camella Fallow, MD;  Location: [facility] OR;  Service: Orthopedics;  Laterality: Left;  1 hr Block with IV Sedation   KNEE CARTILAGE SURGERY Bilateral    LEFT HEART CATH AND CORONARY ANGIOGRAPHY N/A 03/18/2017   Procedure: Left Heart Cath and Coronary Angiography;  Surgeon: Anner Alm ORN, MD;  Location: [facility] INVAS[facility] CV LAB;  Service: Cardiovascular:  55% dLM, p-mLAD 75% - mLAD 75%.  EF 55-65%. Mod non-obstructive RCA disease --> referred for CABG   LOOP RECORDER INSERTION N/A 08/05/2019   Procedure: LOOP RECORDER INSERTION;  Surgeon: Inocencio Soyla Lunger, MD;  Location: [facility] INVASIVE CV LAB;  Service: Cardiovascular;  Laterality: N/A;   MEDIAL PARTIAL KNEE REPLACEMENT Right 2010   Partial right knee replacement   NM MYOVIEW  LTD  01/2017   INTERMEDIATE RISK: Exercise tolerance was good at 10 minutes, however, fatigue and dyspnea were reporte-d -> hypertensive response to exercise.  3mm Horizontal ST depressions noted during stress in the II, III, aVF, V6, V5 and V4 leads c/w ischemia.   Small siize, moderate severity perfusion defect  mid to distal anterior wall perfusion defect suggestive of ischemia..    NM MYOVIEW  LTD  05/07/2022   Treadmill Myoview : Average exercise capacity (7:00 min:s; 8.5 METS). Normal HR response to exercise. Hypertensive response to exercise.  EF 55 to 65% (60%).  Normal size and function.  NO EVIDENCE OF ISCHEMIA OR INFARCTION -> NORMAL/LOW RISK   PARTIAL KNEE ARTHROPLASTY Left 10/24/2023   Procedure: ARTHROPLASTY, KNEE, UNICOMPARTMENTAL;  Surgeon: Ernie Cough, MD;  Location: [facility] ORS;   Service: Orthopedics;  Laterality: Left;   ROTATOR CUFF REPAIR Left 2008   SHOULDER ARTHROSCOPY W/ ROTATOR CUFF REPAIR Right 06/15/2016   TEE WITHOUT CARDIOVERSION N/A 04/04/2017   Procedure: TRANSESOPHAGEAL ECHOCARDIOGRAM (TEE);  Surgeon: Lucas Dorise POUR, MD;  Location: [facility] OR;  Service: Open Heart Surgery;  Laterality: N/A;   TEE WITHOUT CARDIOVERSION N/A 08/05/2019   Procedure: TRANSESOPHAGEAL ECHOCARDIOGRAM (TEE);  Surgeon: Lonni Slain, MD;  Location: [facility] ENDOSCOPY:: EF 60 to 65%, no RWMA.  Normal atria, normal RV.  Normal valves.  No vegetation noted.  No LAA thrombus.  Negative bubble study.  Mild-diffuse aortic atherosclerosis   TONSILLECTOMY     TRANSTHORACIC ECHOCARDIOGRAM  08/20/2021   a) 05/21/2019: EF 60 to 65%.  No LVH.  Indeterminate LV filling.  No RWMA.  Normal RV.  Normal atria.  Normal aortic and mitral valves other than mild aortic sclerosis.  Normal RVSP and RAP.;; b) 08/20/2021:    SOCIAL HISTORY: Social History   Socioeconomic History   Marital status: Married    Spouse name: Not on  file   Number of children: 2   Years of education: Not on file   Highest education level: Master's degree (e.g., MA, MS, MEng, MEd, MSW, MBA)  Occupational History   Occupation: retired    Comment: airline pilot  Tobacco Use   Smoking status: Never   Smokeless tobacco: Never  Vaping Use   Vaping status: Never Used  Substance and Sexual Activity   Alcohol  use: Yes    Alcohol /week: 7.0 standard drinks of alcohol     Types: 7 Glasses of wine per week   Drug use: No   Sexual activity: Yes  Other Topics Concern   Not on file  Social History Narrative   Alm is a very pleasant married gentleman with 2 children Arleen and [place]) each with 3 children/total 6 grandchildren. He is currently retired now from his original job in [employer], however he still serves on [employer] at [employer] and recently stepped down from [employer] at [employer].   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    For the last several months he has been off caffeine and wine because of GI upset symptoms and palpitations. Also trying to lose weight.     He used to drink maybe 2 glasses of wine at night, and he is subsequently all quit alcohol .      He is extremely active working out routinely doing squatting exercises and rides his bicycle or walks just about every day.  He does 300 push-ups to 3 times a week.  Over the summer he also enjoys kayaking (paddling anywhere from 45 minutes to 1-1/2 hours).   At the end of the summer, he actually did a 3-1/2-hour paddle kayaking around [id number] where they have a beach house.  He did avoid going out into the open ocean to avoid the waves because he had no one paddling with him.  He did feel little bit tired after that and was worn out for the next day, but denied any chest pain or pressure.  No exertional dyspnea.       He and his wife spend Memorial Day to [place] at [employer] ([place]umably on the [employer]).  He then returned to [facility] for the following winter.  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       He also was very fastidious on his diet.  He tries to cut down his caffeine intake because he had some palpitations off and on.  He says he is always high energy but does note a difference from when he was 83 years old compared to being 78.  He has had nothing like the symptoms he had prior to his CABG and is done remarkably well since his CABG.   Social Drivers of Corporate Investment Banker Strain: Not on file  Food Insecurity: No Food Insecurity (05/27/2024)   Hunger Vital Sign    Worried About Running Out of Food in the Last Year: Never true    Ran Out of Food in the Last Year: Never true  Transportation Needs: No Transportation Needs (05/27/2024)   PRAPARE - Administrator, Civil Service (Medical): No    Lack of  Transportation (Non-Medical): No  Physical Activity: Not on file  Stress: Not on file  Social Connections: Unknown (06/07/2023)   [facility]   Social Network  Social Network: Not on file  Intimate Partner Violence: Not At Risk (05/27/2024)   Humiliation, Afraid, Rape, and Kick questionnaire    Fear of Current or Ex-Partner: No    Emotionally Abused: No    Physically Abused: No    Sexually Abused: No    FAMILY HISTORY: Family History  Problem Relation Age of Onset   Heart failure Mother    Cancer Mother        breast   Coronary artery disease Father        CABG   Heart attack Father        Long-term smoker.   Hyperlipidemia Father    Hyperlipidemia Brother    Hypertension Brother    Hyperlipidemia Son    Healthy Daughter    Colon cancer Neg Hx    Colon polyps Neg Hx    Rectal cancer Neg Hx    Stomach cancer Neg Hx     ALLERGIES:  is allergic to oxycodone , caffeine, hydrocodone , nexium [esomeprazole], other, and crestor [rosuvastatin calcium ].  MEDICATIONS:  Current Outpatient Medications  Medication Sig Dispense Refill   Ascorbic Acid (VITAMIN C) 1000 MG tablet Take 1,000 mg by mouth 2 (two) times daily.     aspirin  EC 81 MG tablet Take 81 mg by mouth at bedtime. (Patient taking differently: Take 81 mg by mouth at bedtime. Takes every other evening)     B Complex Vitamins (B COMPLEX 1 PO) Take 1 capsule by mouth daily.     Cholecalciferol (VITAMIN D3 MAXIMUM STRENGTH) 125 MCG (5000 UT) capsule Take 5,000 Units by mouth at bedtime.     CHOLINE PO Take 800 mg by mouth 2 (two) times daily.     cyanocobalamin (VITAMIN B12) 1000 MCG tablet Take 1,000 mcg by mouth at bedtime.     EPINEPHrine  0.3 mg/0.3 mL IJ SOAJ injection Inject 0.3 mg into the muscle as needed for anaphylaxis.     ezetimibe  (ZETIA ) 10 MG tablet Take 10 mg by mouth daily.      FIBER PO Take 1 tablet by mouth 2 (two) times daily.     MAGNESIUM  GLYCINATE PO Take 1 tablet by mouth at  bedtime.     Nutritional Supplements (KETO PO) Take 1 capsule by mouth daily. Medication is called Keto DHEA 7     OVER THE COUNTER MEDICATION Take 1 tablet by mouth 2 (two) times daily. Total Restor supplement     pantoprazole  (PROTONIX ) 40 MG tablet Take 40 mg by mouth daily.     pravastatin  (PRAVACHOL ) 20 MG tablet Take 20 mg by mouth at bedtime.     Probiotic Product (ALIGN) 4 MG CAPS Take 4 mg by mouth at bedtime.     tadalafil (ADCIRCA/CIALIS) 20 MG tablet Take 20 mg by mouth at bedtime.     Testosterone  25 MG/2.5GM (1%) GEL 1 mL by Other route See admin instructions. APPLY 4 CLICKS (1 ML) TO ALTERNATING SHOULDERS DAILY     valsartan (DIOVAN) 80 MG tablet Take 40 mg by mouth 2 (two) times daily.     Zinc 30 MG TABS Take 1 tablet by mouth daily.     No current facility-administered medications for this visit.    REVIEW OF SYSTEMS:   Constitutional: ( - ) fevers, ( - )  chills , ( - ) night sweats Eyes: ( - ) blurriness of vision, ( - ) double vision, ( - ) watery eyes Ears, nose, mouth, throat, and face: ( - ) mucositis, ( - )  sore throat Respiratory: ( - ) cough, ( - ) dyspnea, ( - ) wheezes Cardiovascular: ( - ) palpitation, ( - ) chest discomfort, ( - ) lower extremity swelling Gastrointestinal:  ( - ) nausea, ( - ) heartburn, ( - ) change in bowel habits Skin: ( - ) abnormal skin rashes Lymphatics: ( - ) new lymphadenopathy, ( - ) easy bruising Neurological: ( - ) numbness, ( - ) tingling, ( - ) new weaknesses Behavioral/Psych: ( - ) mood change, ( - ) new changes  All other systems were reviewed with the patient and are negative.  PHYSICAL EXAMINATION: ECOG PERFORMANCE STATUS: 1 - Symptomatic but completely ambulatory  Vitals:   07/03/24 0953  BP: (!) 126/55  Pulse: 62  Resp: 13  Temp: 98.2 F (36.8 C)  SpO2: 95%   Filed Weights   07/03/24 0953  Weight: 177 lb 4.8 oz (80.4 kg)    GENERAL: Well-appearing elderly Caucasian male alert, no distress and  comfortable SKIN: skin color, texture, turgor are normal, no rashes or significant lesions EYES: conjunctiva are pink and non-injected, sclera clear OROPHARYNX: no exudate, no erythema; lips, buccal mucosa, and tongue normal  NECK: supple, non-tender LYMPH:  no palpable lymphadenopathy in the cervical, axillary or inguinal LUNGS: clear to auscultation and percussion with normal breathing effort HEART: regular rate & rhythm and no murmurs and no lower extremity edema Musculoskeletal: no cyanosis of digits and no clubbing  PSYCH: alert & oriented x 3, fluent speech NEURO: no focal motor/sensory deficits  LABORATORY DATA:  I have reviewed the data as listed    Latest Ref Rng & Units 07/03/2024    9:26 AM 06/25/2024    7:34 AM 05/27/2024    1:48 PM  CBC  WBC 4.0 - 10.5 K/uL 8.2  8.6  8.4   Hemoglobin 13.0 - 17.0 g/dL 89.1  89.6  88.6   Hematocrit 39.0 - 52.0 % 33.5  33.2  34.1   Platelets 150 - 400 K/uL 132  127  148        Latest Ref Rng & Units 07/03/2024    9:26 AM 05/27/2024    1:48 PM 10/17/2023    8:54 AM  CMP  Glucose 70 - 99 mg/dL 867  81  84   BUN 8 - 23 mg/dL 19  20  20    Creatinine 0.61 - 1.24 mg/dL 9.01  9.18  9.16   Sodium 135 - 145 mmol/L 142  143  138   Potassium 3.5 - 5.1 mmol/L 4.1  4.4  4.1   Chloride 98 - 111 mmol/L 108  108  105   CO2 22 - 32 mmol/L 27  30  26    Calcium  8.9 - 10.3 mg/dL 9.2  9.7  8.4   Total Protein 6.5 - 8.1 g/dL 6.5  7.1    Total Bilirubin 0.0 - 1.2 mg/dL 0.9  0.8    Alkaline Phos 38 - 126 U/L 90  102    AST 15 - 41 U/L 17  15    ALT 0 - 44 U/L 9  10      Lab Results  Component Value Date   MPROTEIN 0.3 (H) 05/27/2024   Lab Results  Component Value Date   KPAFRELGTCHN 30.5 (H) 05/27/2024   LAMBDASER 7.3 05/27/2024   KAPLAMBRATIO 4.77 06/04/2024   KAPLAMBRATIO 4.18 (H) 05/27/2024     RADIOGRAPHIC STUDIES: CT BONE MARROW BIOPSY & ASPIRATION Result Date: 06/25/2024 CLINICAL DATA:  IgM kappa monoclonal gammopathy  EXAM: CT-guided  bone marrow biopsy TECHNIQUE: CT pelvis CONTRAST:  None RADIOPHARMACEUTICALS:  None COMPARISON:  None FINDINGS: The patient was placed in prone position on the CT gantry. Radiopaque markers were placed on the patient's skin and initial imaging of the pelvis was performed. The patient's skin was then prepped and draped in the usual sterile fashion. Moderate sedation was provided for by the nursing staff under my supervision utilizing intravenous Versed  and fentanyl . The nurse had no other duties other than monitoring the patient and providing sedation during the procedure. I was present for the entire procedure. 2 mg intravenous Versed  and 100 mcg intravenous fentanyl  administered for a total sedation time of 10 minutes. 1% lidocaine  was used to infiltrate the skin at the access site prior to a stab incision. Local anesthesia was then used to infiltrate the region of soft tissue from the skin to the right iliac bone. The bone marrow needle was then advanced and imaging demonstrated the needle tip to be in the cortex of the right iliac bone. The bone was then penetrated and a sample was obtained. After the sample was evaluated, approximately 5 mL of heparinized bone marrow sample was obtained by aspiration. A core sample was then obtained. Multiple attempts at sampling was performed in order to get 2 1 cm segments. All needles were then removed from the patient. Sterile dressing was applied. IMPRESSION: Satisfactory core needle biopsy and aspiration of the right iliac bone marrow under CT guidance. Electronically Signed   By: Cordella Banner   On: 06/25/2024 10:03   CUP PACEART REMOTE DEVICE CHECK Result Date: 06/12/2024 ILR summary report received. Battery status OK. Normal device function. No new symptom, tachy, brady, or pause episodes. No new AF episodes. Monthly summary reports and ROV/PRN - CS, CVRS   ASSESSMENT & PLAN Padraic Marinos Adamec 83 y.o. male with medical history significant for a B-cell  lymphoma who presents for a follow up visit.  # Concern for Lymphoplasmacytic Lymphoma vs Splenic Marginal Zone Lymphoma # IgM Kappa Monoclonal Gammopathy # Splenomegaly  -- Recommend a PET CT scan in order to evaluate lucencies noted on the bone met survey as well as evaluate lymphadenopathy/splenomegaly. -- Unifying diagnoses would include a Waldenstrm's macroglobulinemia versus a splenic marginal zone lymphoma. -- Currently testing for MYD 88 is out, anticipate it should be available within the next week. -- Pending the results of that test will determine if we need to treat this like a Waldenstrm's versus a marginal zone lymphoma. -- Labs today show white blood cell count 8.2, hemoglobin 10.8, MCV 88.9, platelets 132 -- Placeholder visit in approximately 3 weeks time to reevaluate.  No orders of the defined types were placed in this encounter.   All questions were answered. The patient knows to call the clinic with any problems, questions or concerns.  A total of more than 30 minutes were spent on this encounter with face-to-face time and non-face-to-face time, including preparing to see the patient, ordering tests and/or medications, counseling the patient and coordination of care as outlined above.   Norleen IVAR Kidney, MD Department of Hematology/Oncology [facility] Phone: [phone] Pager: [phone] Email: [email or web address]  07/03/2024 4:57 PM

## 2024-07-04 ENCOUNTER — Encounter: Payer: Self-pay | Admitting: Hematology and Oncology

## 2024-07-06 ENCOUNTER — Encounter (HOSPITAL_COMMUNITY): Payer: Self-pay | Admitting: Hematology and Oncology

## 2024-07-06 DIAGNOSIS — L905 Scar conditions and fibrosis of skin: Secondary | ICD-10-CM | POA: Diagnosis not present

## 2024-07-06 DIAGNOSIS — L80 Vitiligo: Secondary | ICD-10-CM | POA: Diagnosis not present

## 2024-07-06 DIAGNOSIS — D692 Other nonthrombocytopenic purpura: Secondary | ICD-10-CM | POA: Diagnosis not present

## 2024-07-06 DIAGNOSIS — L578 Other skin changes due to chronic exposure to nonionizing radiation: Secondary | ICD-10-CM | POA: Diagnosis not present

## 2024-07-06 DIAGNOSIS — L570 Actinic keratosis: Secondary | ICD-10-CM | POA: Diagnosis not present

## 2024-07-06 DIAGNOSIS — Z85828 Personal history of other malignant neoplasm of skin: Secondary | ICD-10-CM | POA: Diagnosis not present

## 2024-07-12 ENCOUNTER — Encounter

## 2024-07-13 ENCOUNTER — Encounter (HOSPITAL_COMMUNITY)
Admission: RE | Admit: 2024-07-13 | Discharge: 2024-07-13 | Disposition: A | Discharge status: Home / Self Care | Source: Ambulatory Visit | Attending: Hematology and Oncology | Admitting: Hematology and Oncology

## 2024-07-13 ENCOUNTER — Ambulatory Visit

## 2024-07-13 ENCOUNTER — Encounter (HOSPITAL_COMMUNITY): Payer: Self-pay

## 2024-07-13 DIAGNOSIS — C8100 Nodular lymphocyte predominant Hodgkin lymphoma, unspecified site: Secondary | ICD-10-CM | POA: Diagnosis not present

## 2024-07-13 DIAGNOSIS — I639 Cerebral infarction, unspecified: Secondary | ICD-10-CM

## 2024-07-13 DIAGNOSIS — R161 Splenomegaly, not elsewhere classified: Secondary | ICD-10-CM | POA: Diagnosis not present

## 2024-07-13 DIAGNOSIS — C8299 Follicular lymphoma, unspecified, extranodal and solid organ sites: Secondary | ICD-10-CM | POA: Diagnosis not present

## 2024-07-13 LAB — GLUCOSE, CAPILLARY: Glucose-Capillary: 90 mg/dL (ref 70–99)

## 2024-07-13 MED ORDER — FLUDEOXYGLUCOSE F - 18 (FDG) INJECTION
8.7900 | Freq: Once | INTRAVENOUS | Status: AC
  Administered 2024-07-13: 8.79 via INTRAVENOUS

## 2024-07-14 LAB — CUP PACEART REMOTE DEVICE CHECK
Date Time Interrogation Session: 20251123230948
Implantable Pulse Generator Implant Date: 20201216

## 2024-07-14 NOTE — Progress Notes (Signed)
 Remote Loop Recorder Transmission

## 2024-07-15 ENCOUNTER — Ambulatory Visit: Payer: Self-pay | Admitting: Cardiology

## 2024-07-20 ENCOUNTER — Other Ambulatory Visit: Payer: Self-pay | Admitting: Physician Assistant

## 2024-07-20 DIAGNOSIS — D472 Monoclonal gammopathy: Secondary | ICD-10-CM

## 2024-07-20 DIAGNOSIS — C8299 Follicular lymphoma, unspecified, extranodal and solid organ sites: Secondary | ICD-10-CM

## 2024-07-20 NOTE — Progress Notes (Signed)
 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 Federico  06/03/2024: DG bone met survey showed small lucencies within the proximal right humerus, distal right femur, and proximal right tibia  06/25/2024: CT bone marrow biopsy showed 70% cellular bone marrow with 30% lymphoplasmacytic mature B cells. MYD 88 not detected.    Interval History:  Jordan [name] 83 y.o. male with medical history significant for a B-cell lymphoma who presents for a follow up visit. The patient's last visit was on 07/03/24. He is accompanied by his wife for the visit and his daughter was on the phone.  Jordan [name] reports having mild fatigue but he continues to complete his ADLs on his own. His appetite and weight are overall stable. He denies nausea, vomiting or abdominal pain. His bowel habits are unchanged without recurrent episodes of diarrhea or constipation. He denies overt signs of bleeding. He denies fevers, chills, sweats, shortness of breath, chest pain or cough. He has no other complaints. Rest of the ROS is below.   MEDICAL HISTORY:  Past Medical History:  Diagnosis Date   Carotid stenosis, asymptomatic 07/27/2011   Cryptogenic stroke (HCC) 04/2019   Minimal carotid disease, normal TTE/TEE; LINQ ILR Placed -> no documented A-fib   DDD (degenerative disc disease)    ED (erectile dysfunction)    Essential hypertension 01/29/2017   GERD (gastroesophageal reflux disease)     History of hiatal hernia    History of kidney stones    passed stones - no surgery required   Hyperlipidemia with target low density lipoprotein (LDL) cholesterol less than 70 mg/dL 91/87/7984   Impaired fasting glucose    Knee pain    bilateral   Left main coronary artery disease 03/18/2017   Cardiac Cath: 55% dLM, tandem p-mLAD 75% --> referred for CABG.   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 Lucas @ [facility]):  LIMA-LAD, SVG-D2, SVG-OM, SVG-PDA (endoscopic SVG harvesting from right leg).   Shoulder pain    Wears hearing aid     SURGICAL HISTORY: Past Surgical History:  Procedure Laterality Date   APPENDECTOMY     2020   BUBBLE STUDY  08/05/2019   Procedure: BUBBLE STUDY;  Surgeon: Lonni Slain, MD;  Location: [facility] ENDOSCOPY;  Service: Cardiovascular;;   Cardiac Stress Test  2008   Normal   CARPAL TUNNEL RELEASE Left 1998   CARPAL TUNNEL RELEASE Right 2008   CHOLECYSTECTOMY, LAPAROSCOPIC  2021   in [place]   COLONOSCOPY  06/2013   [facility] - polyps   CORONARY ARTERY BYPASS GRAFT N/A 04/04/2017   Procedure: CORONARY ARTERY BYPASS GRAFTING x 4  LIMA-LAD, SVG-D2, SVG-OM, SVG-PDA (endoscopic SVG harvesting from right leg);  Surgeon: Lucas Dorise POUR, MD;  Location: [facility] OR;  Service: Open Heart Surgery;  Laterality: N/A;   EXTRACORPOREAL SHOCK WAVE LITHOTRIPSY Right 10/23/2018   Procedure: EXTRACORPOREAL SHOCK  WAVE LITHOTRIPSY (ESWL);  Surgeon: Gaston Hamilton, MD;  Location: [facility] ORS;  Service: Urology;  Laterality: Right;   HERNIA REPAIR Left    inguinal   HIATAL HERNIA REPAIR     no surgery per pt   INCISION AND DRAINAGE WOUND WITH NAILBED REPAIR Left 05/28/2022   Procedure: Irrigation and debridement and open repair bone nail plate nailbed and soft tissue structures;  Surgeon: Camella Fallow, MD;  Location: [facility]  OR;  Service: Orthopedics;  Laterality: Left;  1 hr Block with IV Sedation   KNEE CARTILAGE SURGERY Bilateral    LEFT HEART CATH AND CORONARY ANGIOGRAPHY N/A 03/18/2017   Procedure: Left Heart Cath and Coronary Angiography;  Surgeon: Anner Jordan [name] ORN, MD;  Location: [facility] INVASIVE CV LAB;  Service: Cardiovascular:  55% dLM, p-mLAD 75% - mLAD 75%.  EF 55-65%. Mod non-obstructive RCA disease --> referred for CABG   LOOP RECORDER INSERTION N/A 08/05/2019   Procedure: LOOP RECORDER INSERTION;  Surgeon: Inocencio Soyla Lunger, MD;  Location: [facility] INVASIVE CV LAB;  Service: Cardiovascular;  Laterality: N/A;   MEDIAL PARTIAL KNEE REPLACEMENT Right 2010   Partial right knee replacement   NM MYOVIEW  LTD  01/2017   INTERMEDIATE RISK: Exercise tolerance was good at 10 minutes, however, fatigue and dyspnea were reporte-d -> hypertensive response to exercise.  3mm Horizontal ST depressions noted during stress in the II, III, aVF, V6, V5 and V4 leads c/w ischemia.   Small siize, moderate severity perfusion defect  mid to distal anterior wall perfusion defect suggestive of ischemia..    NM MYOVIEW  LTD  05/07/2022   Treadmill Myoview : Average exercise capacity (7:00 min:s; 8.5 METS). Normal HR response to exercise. Hypertensive response to exercise.  EF 55 to 65% (60%).  Normal size and function.  NO EVIDENCE OF ISCHEMIA OR INFARCTION -> NORMAL/LOW RISK   PARTIAL KNEE ARTHROPLASTY Left 10/24/2023   Procedure: ARTHROPLASTY, KNEE, UNICOMPARTMENTAL;  Surgeon: Ernie Cough, MD;  Location: [facility] ORS;  Service: Orthopedics;  Laterality: Left;   ROTATOR CUFF REPAIR Left 2008   SHOULDER ARTHROSCOPY W/ ROTATOR CUFF REPAIR Right 06/15/2016   TEE WITHOUT CARDIOVERSION N/A 04/04/2017   Procedure: TRANSESOPHAGEAL ECHOCARDIOGRAM (TEE);  Surgeon: Lucas Dorise POUR, MD;  Location: [facility] OR;  Service: Open Heart Surgery;  Laterality: N/A;   TEE WITHOUT CARDIOVERSION N/A 08/05/2019   Procedure: TRANSESOPHAGEAL ECHOCARDIOGRAM (TEE);  Surgeon:  Lonni Slain, MD;  Location: [facility] ENDOSCOPY:: EF 60 to 65%, no RWMA.  Normal atria, normal RV.  Normal valves.  No vegetation noted.  No LAA thrombus.  Negative bubble study.  Mild-diffuse aortic atherosclerosis   TONSILLECTOMY     TRANSTHORACIC ECHOCARDIOGRAM  08/20/2021   a) 05/21/2019: EF 60 to 65%.  No LVH.  Indeterminate LV filling.  No RWMA.  Normal RV.  Normal atria.  Normal aortic and mitral valves other than mild aortic sclerosis.  Normal RVSP and RAP.;; b) 08/20/2021:    SOCIAL HISTORY: Social History   Socioeconomic History   Marital status: Married    Spouse name: Not on file   Number of children: 2   Years of education: Not on file   Highest education level: Master's degree (e.g., MA, MS, MEng, MEd, MSW, MBA)  Occupational History   Occupation: retired    Comment: airline pilot  Tobacco Use   Smoking status: Never   Smokeless tobacco: Never  Vaping Use   Vaping status: Never Used  Substance and Sexual Activity   Alcohol  use: Yes    Alcohol /week: 7.0 standard drinks of alcohol   Types: 7 Glasses of wine per week   Drug use: No   Sexual activity: Yes  Other Topics Concern   Not on file  Social History Narrative   Jordan [name] is a very pleasant married gentleman with 2 children Jordan [name] and Jordan [name]) each with 3 children/total 6 grandchildren. He is currently retired now from his original job in [employer], however he still serves on [employer] at [employer] and recently stepped down from [employer] at [employer].   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    For the last several months he has been off caffeine and wine because of GI upset symptoms and palpitations. Also trying to lose weight.     He used to drink maybe 2 glasses of wine at night, and he is subsequently all quit alcohol .      He is extremely active working out routinely doing squatting exercises and rides his bicycle or walks just about every day.  He  does 300 push-ups to 3 times a week.  Over the summer he also enjoys kayaking (paddling anywhere from 45 minutes to 1-1/2 hours).   At the end of the summer, he actually did a 3-1/2-hour paddle kayaking around [id number] where they have a beach house.  He did avoid going out into the open ocean to avoid the waves because he had no one paddling with him.  He did feel little bit tired after that and was worn out for the next day, but denied any chest pain or pressure.  No exertional dyspnea.       He and his wife spend Memorial Day to Labor Day at [employer] ([place]umably on the [employer]).  He then returned to [facility] for the following winter.  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       He also was very fastidious on his diet.  He tries to cut down his caffeine intake because he had some palpitations off and on.  He says he is always high energy but does note a difference from when he was 83 years old compared to being 56.  He has had nothing like the symptoms he had prior to his CABG and is done remarkably well since his CABG.   Social Drivers of Health   Tobacco Use: Low Risk (06/25/2024)   Patient History    Smoking Tobacco Use: Never    Smokeless Tobacco Use: Never    Passive Exposure: Not on file  Financial Resource Strain: Not on file  Food Insecurity: No Food Insecurity (05/27/2024)   Epic    Worried About Programme Researcher, Broadcasting/film/video in the Last Year: Never true    Ran Out of Food in the Last Year: Never true  Transportation Needs: No Transportation Needs (05/27/2024)   Epic    Lack of Transportation (Medical): No    Lack of Transportation (Non-Medical): No  Physical Activity: Not on file  Stress: Not on file  Social Connections: Unknown (06/07/2023)   Received from [facility]   Social Network    Social Network: Not on file  Intimate Partner Violence: Not At Risk (05/27/2024)   Epic    Fear of Current or Ex-Partner: No     Emotionally Abused: No    Physically Abused: No    Sexually Abused: No  Depression (PHQ2-9): Low Risk (05/27/2024)   Depression (PHQ2-9)  PHQ-2 Score: 0  Alcohol  Screen: Not on file  Housing: Unknown (05/27/2024)   Epic    Unable to Pay for Housing in the Last Year: No    Number of Times Moved in the Last Year: Not on file    Homeless in the Last Year: No  Utilities: Not At Risk (05/27/2024)   Epic    Threatened with loss of utilities: No  Health Literacy: Not on file    FAMILY HISTORY: Family History  Problem Relation Age of Onset   Heart failure Mother    Cancer Mother        breast   Coronary artery disease Father        CABG   Heart attack Father        Long-term smoker.   Hyperlipidemia Father    Hyperlipidemia Brother    Hypertension Brother    Hyperlipidemia Son    Healthy Daughter    Colon cancer Neg Hx    Colon polyps Neg Hx    Rectal cancer Neg Hx    Stomach cancer Neg Hx     ALLERGIES:  is allergic to oxycodone , caffeine, hydrocodone , nexium [esomeprazole], other, and crestor [rosuvastatin calcium ].  MEDICATIONS:  Current Outpatient Medications  Medication Sig Dispense Refill   Ascorbic Acid (VITAMIN C) 1000 MG tablet Take 1,000 mg by mouth 2 (two) times daily.     aspirin  EC 81 MG tablet Take 81 mg by mouth at bedtime. (Patient taking differently: Take 81 mg by mouth at bedtime. Takes every other evening)     B Complex Vitamins (B COMPLEX 1 PO) Take 1 capsule by mouth daily.     Cholecalciferol (VITAMIN D3 MAXIMUM STRENGTH) 125 MCG (5000 UT) capsule Take 5,000 Units by mouth at bedtime.     CHOLINE PO Take 800 mg by mouth 2 (two) times daily.     cyanocobalamin  (VITAMIN B12) 1000 MCG tablet Take 1,000 mcg by mouth at bedtime.     EPINEPHrine  0.3 mg/0.3 mL IJ SOAJ injection Inject 0.3 mg into the muscle as needed for anaphylaxis.     ezetimibe  (ZETIA ) 10 MG tablet Take 10 mg by mouth daily.      FIBER PO Take 1 tablet by mouth 2 (two) times daily.      MAGNESIUM  GLYCINATE PO Take 1 tablet by mouth at bedtime.     Nutritional Supplements (KETO PO) Take 1 capsule by mouth daily. Medication is called Keto DHEA 7     OVER THE COUNTER MEDICATION Take 1 tablet by mouth 2 (two) times daily. Total Restor supplement     pantoprazole  (PROTONIX ) 40 MG tablet Take 40 mg by mouth daily.     pravastatin  (PRAVACHOL ) 20 MG tablet Take 20 mg by mouth at bedtime.     Probiotic Product (ALIGN) 4 MG CAPS Take 4 mg by mouth at bedtime.     tadalafil (ADCIRCA/CIALIS) 20 MG tablet Take 20 mg by mouth at bedtime.     Testosterone  25 MG/2.5GM (1%) GEL 1 mL by Other route See admin instructions. APPLY 4 CLICKS (1 ML) TO ALTERNATING SHOULDERS DAILY     valsartan (DIOVAN) 80 MG tablet Take 40 mg by mouth 2 (two) times daily.     Zinc 30 MG TABS Take 1 tablet by mouth daily.     No current facility-administered medications for this visit.    REVIEW OF SYSTEMS:   Constitutional: ( - ) fevers, ( - )  chills , ( - ) night sweats Eyes: ( - )  blurriness of vision, ( - ) double vision, ( - ) watery eyes Ears, nose, mouth, throat, and face: ( - ) mucositis, ( - ) sore throat Respiratory: ( - ) cough, ( - ) dyspnea, ( - ) wheezes Cardiovascular: ( - ) palpitation, ( - ) chest discomfort, ( - ) lower extremity swelling Gastrointestinal:  ( - ) nausea, ( - ) heartburn, ( - ) change in bowel habits Skin: ( - ) abnormal skin rashes Lymphatics: ( - ) new lymphadenopathy, ( - ) easy bruising Neurological: ( - ) numbness, ( - ) tingling, ( - ) new weaknesses Behavioral/Psych: ( - ) mood change, ( - ) new changes  All other systems were reviewed with the patient and are negative.  PHYSICAL EXAMINATION: ECOG PERFORMANCE STATUS: 1 - Symptomatic but completely ambulatory  Vitals:   07/21/24 1044 07/21/24 1048  BP: (!) 140/67 120/68  Pulse: (!) 59   Resp: 16   Temp: 97.9 F (36.6 C)   SpO2: 96%     Filed Weights   07/21/24 1044  Weight: 174 lb 8 oz (79.2 kg)      GENERAL: Well-appearing elderly Caucasian male alert, no distress and comfortable SKIN: skin color, texture, turgor are normal, no rashes or significant lesions EYES: conjunctiva are pink and non-injected, sclera clear OROPHARYNX: no exudate, no erythema; lips, buccal mucosa, and tongue normal  NECK: supple, non-tender LYMPH:  no palpable lymphadenopathy in the cervical, axillary or inguinal LUNGS: clear to auscultation and percussion with normal breathing effort HEART: regular rate & rhythm and no murmurs and no lower extremity edema Musculoskeletal: no cyanosis of digits and no clubbing  PSYCH: alert & oriented x 3, fluent speech NEURO: no focal motor/sensory deficits  LABORATORY DATA:  I have reviewed the data as listed    Latest Ref Rng & Units 07/29/2024   10:35 AM 07/21/2024   10:27 AM 07/03/2024    9:26 AM  CBC  WBC 4.0 - 10.5 K/uL 8.9  7.9  8.2   Hemoglobin 13.0 - 17.0 g/dL 88.4  88.6  89.1   Hematocrit 39.0 - 52.0 % 35.8  34.8  33.5   Platelets 150 - 400 K/uL 123  126  132        Latest Ref Rng & Units 07/29/2024   10:35 AM 07/21/2024   10:27 AM 07/03/2024    9:26 AM  CMP  Glucose 70 - 99 mg/dL 98  899  867   BUN 8 - 23 mg/dL 20  25  19    Creatinine 0.61 - 1.24 mg/dL 9.06  9.10  9.01   Sodium 135 - 145 mmol/L 140  142  142   Potassium 3.5 - 5.1 mmol/L 4.7  4.5  4.1   Chloride 98 - 111 mmol/L 105  107  108   CO2 22 - 32 mmol/L 27  26  27    Calcium  8.9 - 10.3 mg/dL 9.3  9.1  9.2   Total Protein 6.5 - 8.1 g/dL 7.3  7.4  6.5   Total Bilirubin 0.0 - 1.2 mg/dL 0.8  1.0  0.9   Alkaline Phos 38 - 126 U/L 110  113  90   AST 15 - 41 U/L 24  25  17    ALT 0 - 44 U/L 14  13  9      Lab Results  Component Value Date   MPROTEIN 0.3 (H) 05/27/2024   Lab Results  Component Value Date   KPAFRELGTCHN 30.5 (H) 05/27/2024  LAMBDASER 7.3 05/27/2024   KAPLAMBRATIO 4.77 06/04/2024   KAPLAMBRATIO 4.18 (H) 05/27/2024     RADIOGRAPHIC STUDIES: NM PET Image Initial  (PI) Skull Base To Thigh (F-18 FDG) Result Date: 07/20/2024 EXAM: PET AND CT SKULL BASE TO MID THIGH 07/13/2024 01:36:46 PM TECHNIQUE: RADIOPHARMACEUTICAL: 8.79 mCi F-18 FDG Uptake time 60 minutes. Glucose level 90 mg/dl. Blood pool SUV 1.9; hepatic parenchymal activity SUV 2.6. PET imaging was acquired from the base of the skull to the mid thighs. Non-contrast enhanced computed tomography was obtained for attenuation correction and anatomic localization. COMPARISON: CT chest 02/25/2024. CLINICAL HISTORY: Nodular lymphoma restaging. FINDINGS: HEAD AND NECK: No metabolically active cervical lymphadenopathy. Bilateral common carotid atheromatous vascular calcifications in the neck. CHEST: No metabolically active pulmonary nodules. No metabolically active lymphadenopathy. Thoracic aortic, coronary artery, and branch vessel atheromatous vascular calcifications. Mild to moderate cardiomegaly. Prior CABG. Scarring in both lower lobes. ABDOMEN AND PELVIS: No metabolically active intraperitoneal mass. No metabolically active lymphadenopathy. Physiologic activity within the gastrointestinal and genitourinary systems. The spleen measures 16.9 x 10.7 x 19.0 cm, with a total volume of 1797 ml. Abnormal accentuated splenic activity, maximum SUV 4.0. Systemic atherosclerosis is present, including the aorta and iliac arteries. BONES AND SOFT TISSUE: Spondylosis. Faintly heterogeneous but only mildly accentuated skeletal activity. Activity in the L1 vertebral body SUV 2.4. No metabolically active aggressive osseous lesion. IMPRESSION: 1. Abnormal accentuated splenic activity with splenomegaly (volume 1,797 mL) and maximum SUV 4.0. 2. Mildly heterogeneous but not overtly accentuated background marrow activity in the skeleton. 3. No pathologic adenopathy identified. 4. Other findings: Extensive atherosclerosis.  Scarring in both lower lobes. Electronically signed by: Ryan Salvage MD 07/20/2024 12:12 PM EST RP Workstation:  [id number]   CUP PACEART REMOTE DEVICE CHECK Result Date: 07/14/2024 ILR summary report received. Battery status OK. Normal device function. No new symptom, tachy, brady, or pause episodes. No new AF episodes. Monthly summary reports and ROV/PRN.  No histogram data, acceptable HR graph LA, CVRS   ASSESSMENT & PLAN Jordan [name] is a 83 y.o. male for recently diagnosed splenic marginal zone lymphoma who presents for a follow up visit.  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 IFE showed IgM monoclonal protein with kappa light chain specificity. Kappa light chain was elevated at 30.5 with ratio of 4.18.  --Bone marrow biopsy from 06/25/2024 showed hypercellular bone marrow (70%) involved by a CD5/CD10 negative mature B-cell lymphoma (approximately 30% of cellularity)  -- MYD 88 is not detected so final diagnosis is marginal zone lymphoma. --PET scan from 07/13/2024 showed splenomegaly with abnormal accentuated splenic activity. Mildly heterogenous but not overtly accentuated background marrow activity int he skeleton. No adenopathy identified. No metabolically active bone lesions.  -- Labs today show WBC 7.9, Hgb 11.3, Plt 126K, creatinine and LFTs are normal. LDH elevated at 252.  --Reviewed Dr. Lafonda recommendation for weekly Rituximab therapy x 4 doses. Discussed dose, frequency and common side effects --Will arrange for chemoeducation with anticipation to start treatment next 1-2 weeks --RTC for a toxicity check prior to Cycle 2 of Rituximab .     No orders of the defined types were placed in this encounter.   All questions were answered. The patient knows to call the clinic with any problems, questions or concerns.   I have spent a total of 30 minutes minutes of face-to-face and non-face-to-face time, preparing to see the patient,, performing a medically appropriate  examination,  counseling and educating the patient, documenting clinical information in the electronic health record, independently interpreting results and communicating results to the patient, and care coordination.   Jordan Police PA-C Dept of Hematology and Oncology [facility] Phone: [phone]   07/30/2024 4:05 PM  I have read the above note and personally examined the patient. I agree with the assessment and plan as noted above.  Briefly Jordan [name] is an 83 year old male who presents for his first treatment of rituximab for splenic marginal zone lymphoma.  He has not had any major changes in his health since our last discussion.  We right him with the opportunity to ask any and all questions prior to the start of therapy.  He voiced his understanding of the expected side effects and results of his treatment.  He is willing and able to proceed with treatment at this time.   Norleen IVAR Kidney, MD Department of Hematology/Oncology [facility] at [facility] Phone: [phone] Pager: [phone] Email: [email or web address]

## 2024-07-21 ENCOUNTER — Inpatient Hospital Stay: Admitting: Physician Assistant

## 2024-07-21 ENCOUNTER — Inpatient Hospital Stay: Attending: Hematology and Oncology

## 2024-07-21 VITALS — BP 120/68 | HR 59 | Temp 97.9°F | Resp 16 | Wt 174.5 lb

## 2024-07-21 DIAGNOSIS — Z79899 Other long term (current) drug therapy: Secondary | ICD-10-CM | POA: Diagnosis not present

## 2024-07-21 DIAGNOSIS — C8307 Small cell B-cell lymphoma, spleen: Secondary | ICD-10-CM | POA: Diagnosis present

## 2024-07-21 DIAGNOSIS — Z5112 Encounter for antineoplastic immunotherapy: Secondary | ICD-10-CM | POA: Insufficient documentation

## 2024-07-21 DIAGNOSIS — C8299 Follicular lymphoma, unspecified, extranodal and solid organ sites: Secondary | ICD-10-CM

## 2024-07-21 LAB — CBC WITH DIFFERENTIAL (CANCER CENTER ONLY)
Abs Immature Granulocytes: 0.01 K/uL (ref 0.00–0.07)
Basophils Absolute: 0 K/uL (ref 0.0–0.1)
Basophils Relative: 0 %
Eosinophils Absolute: 0.1 K/uL (ref 0.0–0.5)
Eosinophils Relative: 1 %
HCT: 34.8 % — ABNORMAL LOW (ref 39.0–52.0)
Hemoglobin: 11.3 g/dL — ABNORMAL LOW (ref 13.0–17.0)
Immature Granulocytes: 0 %
Lymphocytes Relative: 67 %
Lymphs Abs: 5.3 K/uL — ABNORMAL HIGH (ref 0.7–4.0)
MCH: 29 pg (ref 26.0–34.0)
MCHC: 32.5 g/dL (ref 30.0–36.0)
MCV: 89.2 fL (ref 80.0–100.0)
Monocytes Absolute: 0.7 K/uL (ref 0.1–1.0)
Monocytes Relative: 9 %
Neutro Abs: 1.8 K/uL (ref 1.7–7.7)
Neutrophils Relative %: 23 %
Platelet Count: 126 K/uL — ABNORMAL LOW (ref 150–400)
RBC: 3.9 MIL/uL — ABNORMAL LOW (ref 4.22–5.81)
RDW: 17.3 % — ABNORMAL HIGH (ref 11.5–15.5)
Smear Review: NORMAL
WBC Count: 7.9 K/uL (ref 4.0–10.5)
nRBC: 0 % (ref 0.0–0.2)

## 2024-07-21 LAB — CMP (CANCER CENTER ONLY)
ALT: 13 U/L (ref 0–44)
AST: 25 U/L (ref 15–41)
Albumin: 4.3 g/dL (ref 3.5–5.0)
Alkaline Phosphatase: 113 U/L (ref 38–126)
Anion gap: 9 (ref 5–15)
BUN: 25 mg/dL — ABNORMAL HIGH (ref 8–23)
CO2: 26 mmol/L (ref 22–32)
Calcium: 9.1 mg/dL (ref 8.9–10.3)
Chloride: 107 mmol/L (ref 98–111)
Creatinine: 0.89 mg/dL (ref 0.61–1.24)
GFR, Estimated: >60 mL/min (ref >60)
Glucose, Bld: 100 mg/dL — ABNORMAL HIGH (ref 70–99)
Potassium: 4.5 mmol/L (ref 3.5–5.1)
Sodium: 142 mmol/L (ref 135–145)
Total Bilirubin: 1 mg/dL (ref 0.0–1.2)
Total Protein: 7.4 g/dL (ref 6.5–8.1)

## 2024-07-21 LAB — LACTATE DEHYDROGENASE: LDH: 252 U/L — ABNORMAL HIGH (ref 105–235)

## 2024-07-22 NOTE — Progress Notes (Signed)
 FU with Johnston 07/21/24. Patient will begin chemotherapy in 1-2 weeks with Rituxin X 4 doses.

## 2024-07-25 ENCOUNTER — Other Ambulatory Visit: Payer: Self-pay | Admitting: Hematology and Oncology

## 2024-07-25 DIAGNOSIS — C8307 Small cell B-cell lymphoma, spleen: Secondary | ICD-10-CM | POA: Insufficient documentation

## 2024-07-25 NOTE — Progress Notes (Signed)
 START ON PATHWAY REGIMEN - Lymphoma and CLL     A cycle is every 7 days:     Rituximab-xxxx   **Always confirm dose/schedule in your pharmacy ordering system**  Patient Characteristics: Marginal Zone Lymphoma, Localized, Splenic, Hepatitis C Negative Disease Type: Not Applicable Disease Type: Marginal Zone Lymphoma Disease Type: Not Applicable Localized or Systemic Disease<= Localized Localized Disease Type: Splenic Hepatitis C Status: Negative Intent of Therapy: Curative Intent, Discussed with Patient

## 2024-07-26 ENCOUNTER — Other Ambulatory Visit: Payer: Self-pay

## 2024-07-29 ENCOUNTER — Inpatient Hospital Stay

## 2024-07-29 ENCOUNTER — Other Ambulatory Visit: Payer: Self-pay | Admitting: Hematology and Oncology

## 2024-07-29 DIAGNOSIS — Z5112 Encounter for antineoplastic immunotherapy: Secondary | ICD-10-CM | POA: Diagnosis not present

## 2024-07-29 DIAGNOSIS — C8307 Small cell B-cell lymphoma, spleen: Secondary | ICD-10-CM

## 2024-07-29 LAB — CMP (CANCER CENTER ONLY)
ALT: 14 U/L (ref 0–44)
AST: 24 U/L (ref 15–41)
Albumin: 4.3 g/dL (ref 3.5–5.0)
Alkaline Phosphatase: 110 U/L (ref 38–126)
Anion gap: 8 (ref 5–15)
BUN: 20 mg/dL (ref 8–23)
CO2: 27 mmol/L (ref 22–32)
Calcium: 9.3 mg/dL (ref 8.9–10.3)
Chloride: 105 mmol/L (ref 98–111)
Creatinine: 0.93 mg/dL (ref 0.61–1.24)
GFR, Estimated: >60 mL/min (ref >60)
Glucose, Bld: 98 mg/dL (ref 70–99)
Potassium: 4.7 mmol/L (ref 3.5–5.1)
Sodium: 140 mmol/L (ref 135–145)
Total Bilirubin: 0.8 mg/dL (ref 0.0–1.2)
Total Protein: 7.3 g/dL (ref 6.5–8.1)

## 2024-07-29 LAB — CBC WITH DIFFERENTIAL (CANCER CENTER ONLY)
Abs Immature Granulocytes: 0.02 K/uL (ref 0.00–0.07)
Basophils Absolute: 0 K/uL (ref 0.0–0.1)
Basophils Relative: 0 %
Eosinophils Absolute: 0.2 K/uL (ref 0.0–0.5)
Eosinophils Relative: 2 %
HCT: 35.8 % — ABNORMAL LOW (ref 39.0–52.0)
Hemoglobin: 11.5 g/dL — ABNORMAL LOW (ref 13.0–17.0)
Immature Granulocytes: 0 %
Lymphocytes Relative: 66 %
Lymphs Abs: 5.8 K/uL — ABNORMAL HIGH (ref 0.7–4.0)
MCH: 28.9 pg (ref 26.0–34.0)
MCHC: 32.1 g/dL (ref 30.0–36.0)
MCV: 89.9 fL (ref 80.0–100.0)
Monocytes Absolute: 0.8 K/uL (ref 0.1–1.0)
Monocytes Relative: 9 %
Neutro Abs: 2.1 K/uL (ref 1.7–7.7)
Neutrophils Relative %: 23 %
Platelet Count: 123 K/uL — ABNORMAL LOW (ref 150–400)
RBC: 3.98 MIL/uL — ABNORMAL LOW (ref 4.22–5.81)
RDW: 17.3 % — ABNORMAL HIGH (ref 11.5–15.5)
Smear Review: NORMAL
WBC Count: 8.9 K/uL (ref 4.0–10.5)
nRBC: 0 % (ref 0.0–0.2)

## 2024-07-29 LAB — LACTATE DEHYDROGENASE: LDH: 229 U/L (ref 105–235)

## 2024-07-30 ENCOUNTER — Encounter: Payer: Self-pay | Admitting: Hematology and Oncology

## 2024-07-30 NOTE — Progress Notes (Signed)
 Hepatitis B core antibody and Hepatitis B surface antigen labs removed from treatment plan by Dr. Federico. Confirmed with Dr. Federico that we are okay to proceed with first time rituximab on 12/12 without these labs.  Harlene Nasuti, PharmD Oncology Infusion Pharmacist 07/30/2024 11:11 AM

## 2024-07-30 NOTE — Progress Notes (Signed)
 Pharmacist Chemotherapy Monitoring - Initial Assessment    Anticipated start date: 07/31/24   The following has been reviewed per standard work regarding the patient's treatment regimen: The patient's diagnosis, treatment plan and drug doses, and organ/hematologic function Lab orders and baseline tests specific to treatment regimen  The treatment plan start date, drug sequencing, and pre-medications Prior authorization status  Patient's documented medication list, including drug-drug interaction screen and prescriptions for anti-emetics and supportive care specific to the treatment regimen The drug concentrations, fluid compatibility, administration routes, and timing of the medications to be used The patient's access for treatment and lifetime cumulative dose history, if applicable  The patient's medication allergies and previous infusion related reactions, if applicable   Changes made to treatment plan:  N/A  Follow up needed:  Confirmed with Dr. Federico okay to proceed with rituximab without hepatitis B surface antigen and core antibody labs.  Harlene JONELLE Nasuti, RPH, 07/30/2024  11:58 AM

## 2024-07-31 ENCOUNTER — Inpatient Hospital Stay

## 2024-07-31 ENCOUNTER — Inpatient Hospital Stay: Admitting: Physician Assistant

## 2024-07-31 VITALS — BP 106/52 | HR 79 | Temp 97.6°F | Resp 16 | Wt 177.2 lb

## 2024-07-31 VITALS — BP 180/81 | HR 69 | Resp 18

## 2024-07-31 DIAGNOSIS — T50905A Adverse effect of unspecified drugs, medicaments and biological substances, initial encounter: Secondary | ICD-10-CM | POA: Diagnosis not present

## 2024-07-31 DIAGNOSIS — Z5112 Encounter for antineoplastic immunotherapy: Secondary | ICD-10-CM | POA: Diagnosis not present

## 2024-07-31 DIAGNOSIS — C8307 Small cell B-cell lymphoma, spleen: Secondary | ICD-10-CM

## 2024-07-31 MED ORDER — SODIUM CHLORIDE 0.9 % IV SOLN: INTRAVENOUS | Status: DC

## 2024-07-31 MED ORDER — ACETAMINOPHEN 325 MG PO TABS
650.0000 mg | ORAL_TABLET | Freq: Once | ORAL | Status: AC
  Administered 2024-07-31: 650 mg via ORAL
  Filled 2024-07-31: qty 2

## 2024-07-31 MED ORDER — DIPHENHYDRAMINE HCL 25 MG PO CAPS
50.0000 mg | ORAL_CAPSULE | Freq: Once | ORAL | Status: AC
  Administered 2024-07-31: 50 mg via ORAL
  Filled 2024-07-31: qty 2

## 2024-07-31 MED ORDER — SODIUM CHLORIDE 0.9 % IV SOLN: Freq: Once | INTRAVENOUS | Status: DC | PRN

## 2024-07-31 MED ORDER — SODIUM CHLORIDE 0.9 % IV SOLN
375.0000 mg/m2 | Freq: Once | INTRAVENOUS | Status: AC
  Administered 2024-07-31: 700 mg via INTRAVENOUS
  Filled 2024-07-31: qty 50

## 2024-07-31 MED ORDER — METHYLPREDNISOLONE SODIUM SUCC 125 MG IJ SOLR
125.0000 mg | Freq: Once | INTRAMUSCULAR | Status: AC | PRN
  Administered 2024-07-31: 125 mg via INTRAVENOUS

## 2024-07-31 MED ORDER — FAMOTIDINE IN NACL 20-0.9 MG/50ML-% IV SOLN
20.0000 mg | Freq: Once | INTRAVENOUS | Status: AC | PRN
  Administered 2024-07-31: 20 mg via INTRAVENOUS

## 2024-07-31 NOTE — Progress Notes (Signed)
°  °  DATE:  07/31/2024                                        X CHEMO/IMMUNOTHERAPY REACTION           MD: Federico   AGENT/BLOOD PRODUCT RECEIVING TODAY:              Rituximab   AGENT/BLOOD PRODUCT RECEIVING IMMEDIATELY PRIOR TO REACTION:          Rituximab    Vitals:   07/31/24 0940 07/31/24 0946  BP: (!) 168/128 (!) 180/81  Pulse: 68 69  Resp: 16 18  SpO2: 97% 97%      REACTION(S):           rigors, back pain   PREMEDS:     Tylenol  650 mg PO, benadryl  50 mg PO   INTERVENTION: Pepcid  20 mg IV, solu-medrol 125 mg IV   Review of Systems  Review of Systems  Musculoskeletal:  Positive for back pain.  Neurological:  Positive for tremors.  All other systems reviewed and are negative.    Physical Exam  Physical Exam Vitals and nursing note reviewed.  Constitutional:      Appearance: He is not ill-appearing or toxic-appearing.     Comments: rigors  HENT:     Head: Normocephalic.  Eyes:     Conjunctiva/sclera: Conjunctivae normal.  Cardiovascular:     Rate and Rhythm: Normal rate and regular rhythm.     Pulses: Normal pulses.     Heart sounds: Normal heart sounds.  Pulmonary:     Effort: Pulmonary effort is normal.     Breath sounds: Normal breath sounds.  Abdominal:     General: There is no distension.  Musculoskeletal:     Cervical back: Normal range of motion.  Skin:    General: Skin is warm and dry.  Neurological:     Mental Status: He is alert.     OUTCOME:                  Patient became symptomatic approximately I hour into first time Rituxan infusion. Emergency medications were administered as documented above. Patient returned to baseline. Oncologist notified and agrees to resume treatment. Patient tolerated remainder of treatment. Oncologist plans to add pre medications to future treatments.  I have spent a total of 20 minutes minutes of face-to-face and non-face-to-face time preparing to see the patient, performing a medically appropriate  examination, counseling and educating the patient, ordering medications, documenting clinical information in the electronic health record, and care coordination.

## 2024-07-31 NOTE — Progress Notes (Signed)
 Hypersensitivity Reaction note  Date of event: 07/31/2024 Time of event: 0935 Generic name of drug involved: Rituximab Name of provider notified of the hypersensitivity reaction: Mallie ORN, PA-C & Federico, MD Was agent that likely caused hypersensitivity reaction added to Allergies List within EMR? Yes Chain of events including reaction signs/symptoms, treatment administered, and outcome (e.g., drug resumed; drug discontinued; sent to Emergency Department; etc.)   Approximately 1hr into Rituximab infusion, pt c/o 3/10 aching lower back pain and feeling cold. Pt observed to be having rigors. Infusion paused, VS obtained (181/69, HR 69, 98% room air). NS hung to gravity, 20mg  IV Pepcid  given at 0940. Mallie ORN, PA, notified and at chairside. Advised to give 125mg  Solumedrol. BP 168/123. After approximately 15 min, pt reports feeling better. Per Mallie, [place], plan to restart infusion at same rate at 1005. Pt able to tolerate remainder of infusion well. Per Federico, MD, adding 62.5mg  Solumedrol and 20mg  Pepcid  as premedications to txt plan for future.   Ileana Faster, RN 07/31/2024 10:32 AM

## 2024-07-31 NOTE — Progress Notes (Signed)
 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 Federico.  Jayleon Mcfarlane, PharmD, MBA

## 2024-07-31 NOTE — Patient Instructions (Signed)
 [facility] - A DEPT OF [facility]  Discharge Instructions: Thank you for choosing [facility] to provide your oncology and hematology care.   If you have a lab appointment with the Cancer Center, please go directly to the Cancer Center and check in at the registration area.   Wear comfortable clothing and clothing appropriate for easy access to any Portacath or PICC line.   We strive to give you quality time with your provider. You may need to reschedule your appointment if you arrive late (15 or more minutes).  Arriving late affects you and other patients whose appointments are after yours.  Also, if you miss three or more appointments without notifying the office, you may be dismissed from the clinic at the provider's discretion.      For prescription refill requests, have your pharmacy contact our office and allow 72 hours for refills to be completed.    Today you received the following chemotherapy and/or immunotherapy agents: Rituximab      To help prevent nausea and vomiting after your treatment, we encourage you to take your nausea medication as directed.  BELOW ARE SYMPTOMS THAT SHOULD BE REPORTED IMMEDIATELY: *FEVER GREATER THAN 100.4 F (38 C) OR HIGHER *CHILLS OR SWEATING *NAUSEA AND VOMITING THAT IS NOT CONTROLLED WITH YOUR NAUSEA MEDICATION *UNUSUAL SHORTNESS OF BREATH *UNUSUAL BRUISING OR BLEEDING *URINARY PROBLEMS (pain or burning when urinating, or frequent urination) *BOWEL PROBLEMS (unusual diarrhea, constipation, pain near the anus) TENDERNESS IN MOUTH AND THROAT WITH OR WITHOUT PRESENCE OF ULCERS (sore throat, sores in mouth, or a toothache) UNUSUAL RASH, SWELLING OR PAIN  UNUSUAL VAGINAL DISCHARGE OR ITCHING   Items with * indicate a potential emergency and should be followed up as soon as possible or go to the Emergency Department if any problems should occur.  Please show the CHEMOTHERAPY ALERT CARD or IMMUNOTHERAPY  ALERT CARD at check-in to the Emergency Department and triage nurse.  Should you have questions after your visit or need to cancel or reschedule your appointment, please contact [facility] - A DEPT OF [facility]  Dept: [phone]  and follow the prompts.  Office hours are 8:00 a.m. to 4:30 p.m. Monday - Friday. Please note that voicemails left after 4:00 p.m. may not be returned until the following business day.  We are closed weekends and major holidays. You have access to a nurse at all times for urgent questions. Please call the main number to the clinic Dept: [phone] and follow the prompts.   For any non-urgent questions, you may also contact your provider using MyChart. We now offer e-Visits for anyone 30 and older to request care online for non-urgent symptoms. For details visit [email or web address].   Also download the MyChart app! Go to the app store, search "MyChart", open the app, select [facility], and log in with your MyChart username and password.  Rituximab Injection What is this medication? RITUXIMAB (ri TUX i mab) treats leukemia and lymphoma. It works by blocking a protein that causes cancer cells to grow and multiply. This helps to slow or stop the spread of cancer cells. It may also be used to treat autoimmune conditions, such as arthritis. It works by slowing down an overactive immune system. It is a monoclonal antibody. This medicine may be used for other purposes; ask your health care provider or pharmacist if you have questions. COMMON BRAND NAME(S): RIABNI, Rituxan, RUXIENCE, truxima  What should I tell my care team before I take this medication? They need to know if you have any of these conditions: Chest pain Heart disease Immune system problems Infection, such as chickenpox, cold sores, hepatitis B, herpes Irregular heartbeat or rhythm Kidney disease Low blood counts, such as low white cells, platelets, red cells Lung  disease Recent or upcoming vaccine An unusual or allergic reaction to rituximab, other medications, foods, dyes, or preservatives Pregnant or trying to get pregnant Breast-feeding How should I use this medication? This medication is injected into a vein. It is given by a care team in a hospital or clinic setting. A special MedGuide will be given to you before each treatment. Be sure to read this information carefully each time. Talk to your care team about the use of this medication in children. While this medication may be prescribed for children as young as 6 months for selected conditions, precautions do apply. Overdosage: If you think you have taken too much of this medicine contact a poison control center or emergency room at once. NOTE: This medicine is only for you. Do not share this medicine with others. What if I miss a dose? Keep appointments for follow-up doses. It is important not to miss your dose. Call your care team if you are unable to keep an appointment. What may interact with this medication? Do not take this medication with any of the following: Live vaccines This medication may also interact with the following: Cisplatin This list may not describe all possible interactions. Give your health care provider a list of all the medicines, herbs, non-prescription drugs, or dietary supplements you use. Also tell them if you smoke, drink alcohol, or use illegal drugs. Some items may interact with your medicine. What should I watch for while using this medication? Your condition will be monitored carefully while you are receiving this medication. You may need blood work while taking this medication. This medication can cause serious infusion reactions. To reduce the risk your care team may give you other medications to take before receiving this one. Be sure to follow the directions from your care team. This medication may increase your risk of getting an infection. Call your care  team for advice if you get a fever, chills, sore throat, or other symptoms of a cold or flu. Do not treat yourself. Try to avoid being around people who are sick. Call your care team if you are around anyone with measles, chickenpox, or if you develop sores or blisters that do not heal properly. Avoid taking medications that contain aspirin, acetaminophen, ibuprofen, naproxen, or ketoprofen unless instructed by your care team. These medications may hide a fever. This medication may cause serious skin reactions. They can happen weeks to months after starting the medication. Contact your care team right away if you notice fevers or flu-like symptoms with a rash. The rash may be red or purple and then turn into blisters or peeling of the skin. You may also notice a red rash with swelling of the face, lips, or lymph nodes in your neck or under your arms. In some patients, this medication may cause a serious brain infection that may cause death. If you have any problems seeing, thinking, speaking, walking, or standing, tell your care team right away. If you cannot reach your care team, urgently seek another source of medical care. Talk to your care team if you may be pregnant. Serious birth defects can occur if you take this medication during pregnancy  and for 12 months after the last dose. You will need a negative pregnancy test before starting this medication. Contraception is recommended while taking this medication and for 12 months after the last dose. Your care team can help you find the option that works for you. Do not breastfeed while taking this medication and for at least 6 months after the last dose. What side effects may I notice from receiving this medication? Side effects that you should report to your care team as soon as possible: Allergic reactions or angioedema--skin rash, itching or hives, swelling of the face, eyes, lips, tongue, arms, or legs, trouble swallowing or breathing Bowel  blockage--stomach cramping, unable to have a bowel movement or pass gas, loss of appetite, vomiting Dizziness, loss of balance or coordination, confusion or trouble speaking Heart attack--pain or tightness in the chest, shoulders, arms, or jaw, nausea, shortness of breath, cold or clammy skin, feeling faint or lightheaded Heart rhythm changes--fast or irregular heartbeat, dizziness, feeling faint or lightheaded, chest pain, trouble breathing Infection--fever, chills, cough, sore throat, wounds that don't heal, pain or trouble when passing urine, general feeling of discomfort or being unwell Infusion reactions--chest pain, shortness of breath or trouble breathing, feeling faint or lightheaded Kidney injury--decrease in the amount of urine, swelling of the ankles, hands, or feet Liver injury--right upper belly pain, loss of appetite, nausea, light-colored stool, dark yellow or brown urine, yellowing skin or eyes, unusual weakness or fatigue Redness, blistering, peeling, or loosening of the skin, including inside the mouth Stomach pain that is severe, does not go away, or gets worse Tumor lysis syndrome (TLS)--nausea, vomiting, diarrhea, decrease in the amount of urine, dark urine, unusual weakness or fatigue, confusion, muscle pain or cramps, fast or irregular heartbeat, joint pain Side effects that usually do not require medical attention (report to your care team if they continue or are bothersome): Headache Joint pain Nausea Runny or stuffy nose Unusual weakness or fatigue This list may not describe all possible side effects. Call your doctor for medical advice about side effects. You may report side effects to FDA at 1-800-FDA-1088. Where should I keep my medication? This medication is given in a hospital or clinic. It will not be stored at home. NOTE: This sheet is a summary. It may not cover all possible information. If you have questions about this medicine, talk to your doctor, pharmacist,  or health care provider.  2024 Elsevier/Gold Standard (2021-12-28 00:00:00)

## 2024-08-06 ENCOUNTER — Other Ambulatory Visit: Payer: Self-pay | Admitting: *Deleted

## 2024-08-06 ENCOUNTER — Other Ambulatory Visit: Payer: Self-pay | Admitting: Physician Assistant

## 2024-08-06 DIAGNOSIS — D472 Monoclonal gammopathy: Secondary | ICD-10-CM

## 2024-08-06 DIAGNOSIS — C8299 Follicular lymphoma, unspecified, extranodal and solid organ sites: Secondary | ICD-10-CM

## 2024-08-06 NOTE — Progress Notes (Unsigned)
 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 Federico  06/03/2024: DG bone met survey showed small lucencies within the proximal right humerus, distal right femur, and proximal right tibia  06/25/2024: CT bone marrow biopsy showed 70% cellular bone marrow with 30% lymphoplasmacytic mature B cells. MYD 88 not detected.    Interval History:  Jordan [name] 83 y.o. male with medical history significant for a B-cell lymphoma who presents for a follow up visit. The patient's last visit was on 07/03/24. He is accompanied by his wife for the visit and his daughter was on the phone.  Mr. Shellhammer reports having mild fatigue but he continues to complete his ADLs on his own. His appetite and weight are overall stable. He denies nausea, vomiting or abdominal pain. His bowel habits are unchanged without recurrent episodes of diarrhea or constipation. He denies overt signs of bleeding. He denies fevers, chills, sweats, shortness of breath, chest pain or cough. He has no other complaints. Rest of the ROS is below.   MEDICAL HISTORY:  Past Medical History:  Diagnosis Date   Carotid stenosis, asymptomatic 07/27/2011   Cryptogenic stroke (HCC) 04/2019   Minimal carotid disease, normal TTE/TEE; LINQ ILR Placed -> no documented A-fib   DDD (degenerative disc disease)    ED (erectile dysfunction)    Essential hypertension 01/29/2017   GERD (gastroesophageal reflux disease)     History of hiatal hernia    History of kidney stones    passed stones - no surgery required   Hyperlipidemia with target low density lipoprotein (LDL) cholesterol less than 70 mg/dL 91/87/7984   Impaired fasting glucose    Knee pain    bilateral   Left main coronary artery disease 03/18/2017   Cardiac Cath: 55% dLM, tandem p-mLAD 75% --> referred for CABG.   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 Lucas @ [facility]):  LIMA-LAD, SVG-D2, SVG-OM, SVG-PDA (endoscopic SVG harvesting from right leg).   Shoulder pain    Wears hearing aid     SURGICAL HISTORY: Past Surgical History:  Procedure Laterality Date   APPENDECTOMY     2020   BUBBLE STUDY  08/05/2019   Procedure: BUBBLE STUDY;  Surgeon: Lonni Slain, MD;  Location: [facility] ENDOSCOPY;  Service: Cardiovascular;;   Cardiac Stress Test  2008   Normal   CARPAL TUNNEL RELEASE Left 1998   CARPAL TUNNEL RELEASE Right 2008   CHOLECYSTECTOMY, LAPAROSCOPIC  2021   in [place]   COLONOSCOPY  06/2013   [facility] - polyps   CORONARY ARTERY BYPASS GRAFT N/A 04/04/2017   Procedure: CORONARY ARTERY BYPASS GRAFTING x 4  LIMA-LAD, SVG-D2, SVG-OM, SVG-PDA (endoscopic SVG harvesting from right leg);  Surgeon: Lucas Dorise POUR, MD;  Location: [facility] OR;  Service: Open Heart Surgery;  Laterality: N/A;   EXTRACORPOREAL SHOCK WAVE LITHOTRIPSY Right 10/23/2018   Procedure: EXTRACORPOREAL SHOCK  WAVE LITHOTRIPSY (ESWL);  Surgeon: Gaston Hamilton, MD;  Location: [facility] ORS;  Service: Urology;  Laterality: Right;   HERNIA REPAIR Left    inguinal   HIATAL HERNIA REPAIR     no surgery per pt   INCISION AND DRAINAGE WOUND WITH NAILBED REPAIR Left 05/28/2022   Procedure: Irrigation and debridement and open repair bone nail plate nailbed and soft tissue structures;  Surgeon: Camella Fallow, MD;  Location: [facility]  OR;  Service: Orthopedics;  Laterality: Left;  1 hr Block with IV Sedation   KNEE CARTILAGE SURGERY Bilateral    LEFT HEART CATH AND CORONARY ANGIOGRAPHY N/A 03/18/2017   Procedure: Left Heart Cath and Coronary Angiography;  Surgeon: Anner Alm ORN, MD;  Location: [facility] INVASIVE CV LAB;  Service: Cardiovascular:  55% dLM, p-mLAD 75% - mLAD 75%.  EF 55-65%. Mod non-obstructive RCA disease --> referred for CABG   LOOP RECORDER INSERTION N/A 08/05/2019   Procedure: LOOP RECORDER INSERTION;  Surgeon: Inocencio Soyla Lunger, MD;  Location: [facility] INVASIVE CV LAB;  Service: Cardiovascular;  Laterality: N/A;   MEDIAL PARTIAL KNEE REPLACEMENT Right 2010   Partial right knee replacement   NM MYOVIEW  LTD  01/2017   INTERMEDIATE RISK: Exercise tolerance was good at 10 minutes, however, fatigue and dyspnea were reporte-d -> hypertensive response to exercise.  3mm Horizontal ST depressions noted during stress in the II, III, aVF, V6, V5 and V4 leads c/w ischemia.   Small siize, moderate severity perfusion defect  mid to distal anterior wall perfusion defect suggestive of ischemia..    NM MYOVIEW  LTD  05/07/2022   Treadmill Myoview : Average exercise capacity (7:00 min:s; 8.5 METS). Normal HR response to exercise. Hypertensive response to exercise.  EF 55 to 65% (60%).  Normal size and function.  NO EVIDENCE OF ISCHEMIA OR INFARCTION -> NORMAL/LOW RISK   PARTIAL KNEE ARTHROPLASTY Left 10/24/2023   Procedure: ARTHROPLASTY, KNEE, UNICOMPARTMENTAL;  Surgeon: Ernie Cough, MD;  Location: [facility] ORS;  Service: Orthopedics;  Laterality: Left;   ROTATOR CUFF REPAIR Left 2008   SHOULDER ARTHROSCOPY W/ ROTATOR CUFF REPAIR Right 06/15/2016   TEE WITHOUT CARDIOVERSION N/A 04/04/2017   Procedure: TRANSESOPHAGEAL ECHOCARDIOGRAM (TEE);  Surgeon: Lucas Dorise POUR, MD;  Location: [facility] OR;  Service: Open Heart Surgery;  Laterality: N/A;   TEE WITHOUT CARDIOVERSION N/A 08/05/2019   Procedure: TRANSESOPHAGEAL ECHOCARDIOGRAM (TEE);  Surgeon:  Lonni Slain, MD;  Location: [facility] ENDOSCOPY:: EF 60 to 65%, no RWMA.  Normal atria, normal RV.  Normal valves.  No vegetation noted.  No LAA thrombus.  Negative bubble study.  Mild-diffuse aortic atherosclerosis   TONSILLECTOMY     TRANSTHORACIC ECHOCARDIOGRAM  08/20/2021   a) 05/21/2019: EF 60 to 65%.  No LVH.  Indeterminate LV filling.  No RWMA.  Normal RV.  Normal atria.  Normal aortic and mitral valves other than mild aortic sclerosis.  Normal RVSP and RAP.;; b) 08/20/2021:    SOCIAL HISTORY: Social History   Socioeconomic History   Marital status: Married    Spouse name: Not on file   Number of children: 2   Years of education: Not on file   Highest education level: Master's degree (e.g., MA, MS, MEng, MEd, MSW, MBA)  Occupational History   Occupation: retired    Comment: airline pilot  Tobacco Use   Smoking status: Never   Smokeless tobacco: Never  Vaping Use   Vaping status: Never Used  Substance and Sexual Activity   Alcohol  use: Yes    Alcohol /week: 7.0 standard drinks of alcohol   Types: 7 Glasses of wine per week   Drug use: No   Sexual activity: Yes  Other Topics Concern   Not on file  Social History Narrative   Alm is a very pleasant married gentleman with 2 children Arleen and Floweree) each with 3 children/total 6 grandchildren. He is currently retired now from his original job in [employer], however he still serves on [employer] at [employer] and recently stepped down from [employer] at [employer].   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    For the last several months he has been off caffeine and wine because of GI upset symptoms and palpitations. Also trying to lose weight.     He used to drink maybe 2 glasses of wine at night, and he is subsequently all quit alcohol .      He is extremely active working out routinely doing squatting exercises and rides his bicycle or walks just about every day.  He  does 300 push-ups to 3 times a week.  Over the summer he also enjoys kayaking (paddling anywhere from 45 minutes to 1-1/2 hours).   At the end of the summer, he actually did a 3-1/2-hour paddle kayaking around [address] where they have a beach house.  He did avoid going out into the open ocean to avoid the waves because he had no one paddling with him.  He did feel little bit tired after that and was worn out for the next day, but denied any chest pain or pressure.  No exertional dyspnea.       He and his wife spend Memorial Day to Labor Day at [employer] ([place]umably on the [employer]).  He then returned to [employer] for the following winter.  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       He also was very fastidious on his diet.  He tries to cut down his caffeine intake because he had some palpitations off and on.  He says he is always high energy but does note a difference from when he was 83 years old compared to being 61.  He has had nothing like the symptoms he had prior to his CABG and is done remarkably well since his CABG.   Social Drivers of Health   Tobacco Use: Low Risk (06/25/2024)   Patient History    Smoking Tobacco Use: Never    Smokeless Tobacco Use: Never    Passive Exposure: Not on file  Financial Resource Strain: Not on file  Food Insecurity: No Food Insecurity (05/27/2024)   Epic    Worried About Programme Researcher, Broadcasting/film/video in the Last Year: Never true    Ran Out of Food in the Last Year: Never true  Transportation Needs: No Transportation Needs (05/27/2024)   Epic    Lack of Transportation (Medical): No    Lack of Transportation (Non-Medical): No  Physical Activity: Not on file  Stress: Not on file  Social Connections: Unknown (06/07/2023)   Received from [facility]   Social Network    Social Network: Not on file  Intimate Partner Violence: Not At Risk (05/27/2024)   Epic    Fear of Current or Ex-Partner: No     Emotionally Abused: No    Physically Abused: No    Sexually Abused: No  Depression (PHQ2-9): Low Risk (05/27/2024)   Depression (PHQ2-9)  PHQ-2 Score: 0  Alcohol  Screen: Not on file  Housing: Unknown (05/27/2024)   Epic    Unable to Pay for Housing in the Last Year: No    Number of Times Moved in the Last Year: Not on file    Homeless in the Last Year: No  Utilities: Not At Risk (05/27/2024)   Epic    Threatened with loss of utilities: No  Health Literacy: Not on file    FAMILY HISTORY: Family History  Problem Relation Age of Onset   Heart failure Mother    Cancer Mother        breast   Coronary artery disease Father        CABG   Heart attack Father        Long-term smoker.   Hyperlipidemia Father    Hyperlipidemia Brother    Hypertension Brother    Hyperlipidemia Son    Healthy Daughter    Colon cancer Neg Hx    Colon polyps Neg Hx    Rectal cancer Neg Hx    Stomach cancer Neg Hx     ALLERGIES:  is allergic to oxycodone , caffeine, hydrocodone , nexium [esomeprazole], other, rituximab , and crestor [rosuvastatin calcium ].  MEDICATIONS:  Current Outpatient Medications  Medication Sig Dispense Refill   Ascorbic Acid (VITAMIN C) 1000 MG tablet Take 1,000 mg by mouth 2 (two) times daily.     aspirin  EC 81 MG tablet Take 81 mg by mouth at bedtime. (Patient taking differently: Take 81 mg by mouth at bedtime. Takes every other evening)     B Complex Vitamins (B COMPLEX 1 PO) Take 1 capsule by mouth daily.     Cholecalciferol (VITAMIN D3 MAXIMUM STRENGTH) 125 MCG (5000 UT) capsule Take 5,000 Units by mouth at bedtime.     CHOLINE PO Take 800 mg by mouth 2 (two) times daily.     cyanocobalamin  (VITAMIN B12) 1000 MCG tablet Take 1,000 mcg by mouth at bedtime.     EPINEPHrine  0.3 mg/0.3 mL IJ SOAJ injection Inject 0.3 mg into the muscle as needed for anaphylaxis.     ezetimibe  (ZETIA ) 10 MG tablet Take 10 mg by mouth daily.      FIBER PO Take 1 tablet by mouth 2 (two) times  daily.     MAGNESIUM  GLYCINATE PO Take 1 tablet by mouth at bedtime.     Nutritional Supplements (KETO PO) Take 1 capsule by mouth daily. Medication is called Keto DHEA 7     OVER THE COUNTER MEDICATION Take 1 tablet by mouth 2 (two) times daily. Total Restor supplement     pantoprazole  (PROTONIX ) 40 MG tablet Take 40 mg by mouth daily.     pravastatin  (PRAVACHOL ) 20 MG tablet Take 20 mg by mouth at bedtime.     Probiotic Product (ALIGN) 4 MG CAPS Take 4 mg by mouth at bedtime.     tadalafil (ADCIRCA/CIALIS) 20 MG tablet Take 20 mg by mouth at bedtime.     Testosterone  25 MG/2.5GM (1%) GEL 1 mL by Other route See admin instructions. APPLY 4 CLICKS (1 ML) TO ALTERNATING SHOULDERS DAILY     valsartan (DIOVAN) 80 MG tablet Take 40 mg by mouth 2 (two) times daily.     Zinc 30 MG TABS Take 1 tablet by mouth daily.     No current facility-administered medications for this visit.    REVIEW OF SYSTEMS:   Constitutional: ( - ) fevers, ( - )  chills , ( - ) night sweats  Eyes: ( - ) blurriness of vision, ( - ) double vision, ( - ) watery eyes Ears, nose, mouth, throat, and face: ( - ) mucositis, ( - ) sore throat Respiratory: ( - ) cough, ( - ) dyspnea, ( - ) wheezes Cardiovascular: ( - ) palpitation, ( - ) chest discomfort, ( - ) lower extremity swelling Gastrointestinal:  ( - ) nausea, ( - ) heartburn, ( - ) change in bowel habits Skin: ( - ) abnormal skin rashes Lymphatics: ( - ) new lymphadenopathy, ( - ) easy bruising Neurological: ( - ) numbness, ( - ) tingling, ( - ) new weaknesses Behavioral/Psych: ( - ) mood change, ( - ) new changes  All other systems were reviewed with the patient and are negative.  PHYSICAL EXAMINATION: ECOG PERFORMANCE STATUS: 1 - Symptomatic but completely ambulatory  There were no vitals filed for this visit.   There were no vitals filed for this visit.    GENERAL: Well-appearing elderly Caucasian male alert, no distress and comfortable SKIN: skin color,  texture, turgor are normal, no rashes or significant lesions EYES: conjunctiva are pink and non-injected, sclera clear OROPHARYNX: no exudate, no erythema; lips, buccal mucosa, and tongue normal  NECK: supple, non-tender LYMPH:  no palpable lymphadenopathy in the cervical, axillary or inguinal LUNGS: clear to auscultation and percussion with normal breathing effort HEART: regular rate & rhythm and no murmurs and no lower extremity edema Musculoskeletal: no cyanosis of digits and no clubbing  PSYCH: alert & oriented x 3, fluent speech NEURO: no focal motor/sensory deficits  LABORATORY DATA:  I have reviewed the data as listed    Latest Ref Rng & Units 07/29/2024   10:35 AM 07/21/2024   10:27 AM 07/03/2024    9:26 AM  CBC  WBC 4.0 - 10.5 K/uL 8.9  7.9  8.2   Hemoglobin 13.0 - 17.0 g/dL 88.4  88.6  89.1   Hematocrit 39.0 - 52.0 % 35.8  34.8  33.5   Platelets 150 - 400 K/uL 123  126  132        Latest Ref Rng & Units 07/29/2024   10:35 AM 07/21/2024   10:27 AM 07/03/2024    9:26 AM  CMP  Glucose 70 - 99 mg/dL 98  899  867   BUN 8 - 23 mg/dL 20  25  19    Creatinine 0.61 - 1.24 mg/dL 9.06  9.10  9.01   Sodium 135 - 145 mmol/L 140  142  142   Potassium 3.5 - 5.1 mmol/L 4.7  4.5  4.1   Chloride 98 - 111 mmol/L 105  107  108   CO2 22 - 32 mmol/L 27  26  27    Calcium  8.9 - 10.3 mg/dL 9.3  9.1  9.2   Total Protein 6.5 - 8.1 g/dL 7.3  7.4  6.5   Total Bilirubin 0.0 - 1.2 mg/dL 0.8  1.0  0.9   Alkaline Phos 38 - 126 U/L 110  113  90   AST 15 - 41 U/L 24  25  17    ALT 0 - 44 U/L 14  13  9      Lab Results  Component Value Date   MPROTEIN 0.3 (H) 05/27/2024   Lab Results  Component Value Date   KPAFRELGTCHN 30.5 (H) 05/27/2024   LAMBDASER 7.3 05/27/2024   KAPLAMBRATIO 4.77 06/04/2024   KAPLAMBRATIO 4.18 (H) 05/27/2024     RADIOGRAPHIC STUDIES: NM PET Image Initial (PI) Skull Base  To Thigh (F-18 FDG) Result Date: 07/20/2024 EXAM: PET AND CT SKULL BASE TO MID THIGH 07/13/2024  01:36:46 PM TECHNIQUE: RADIOPHARMACEUTICAL: 8.79 mCi F-18 FDG Uptake time 60 minutes. Glucose level 90 mg/dl. Blood pool SUV 1.9; hepatic parenchymal activity SUV 2.6. PET imaging was acquired from the base of the skull to the mid thighs. Non-contrast enhanced computed tomography was obtained for attenuation correction and anatomic localization. COMPARISON: CT chest 02/25/2024. CLINICAL HISTORY: Nodular lymphoma restaging. FINDINGS: HEAD AND NECK: No metabolically active cervical lymphadenopathy. Bilateral common carotid atheromatous vascular calcifications in the neck. CHEST: No metabolically active pulmonary nodules. No metabolically active lymphadenopathy. Thoracic aortic, coronary artery, and branch vessel atheromatous vascular calcifications. Mild to moderate cardiomegaly. Prior CABG. Scarring in both lower lobes. ABDOMEN AND PELVIS: No metabolically active intraperitoneal mass. No metabolically active lymphadenopathy. Physiologic activity within the gastrointestinal and genitourinary systems. The spleen measures 16.9 x 10.7 x 19.0 cm, with a total volume of 1797 ml. Abnormal accentuated splenic activity, maximum SUV 4.0. Systemic atherosclerosis is present, including the aorta and iliac arteries. BONES AND SOFT TISSUE: Spondylosis. Faintly heterogeneous but only mildly accentuated skeletal activity. Activity in the L1 vertebral body SUV 2.4. No metabolically active aggressive osseous lesion. IMPRESSION: 1. Abnormal accentuated splenic activity with splenomegaly (volume 1,797 mL) and maximum SUV 4.0. 2. Mildly heterogeneous but not overtly accentuated background marrow activity in the skeleton. 3. No pathologic adenopathy identified. 4. Other findings: Extensive atherosclerosis.  Scarring in both lower lobes. Electronically signed by: Ryan Salvage MD 07/20/2024 12:12 PM EST RP Workstation: [id number]   CUP PACEART REMOTE DEVICE CHECK Result Date: 07/14/2024 ILR summary report received. Battery status  OK. Normal device function. No new symptom, tachy, brady, or pause episodes. No new AF episodes. Monthly summary reports and ROV/PRN.  No histogram data, acceptable HR graph LA, CVRS   ASSESSMENT & PLAN Jordan [name] is a 83 y.o. male for recently diagnosed splenic marginal zone lymphoma who presents for a follow up visit.  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 IFE showed IgM monoclonal protein with kappa light chain specificity. Kappa light chain was elevated at 30.5 with ratio of 4.18.  --Bone marrow biopsy from 06/25/2024 showed hypercellular bone marrow (70%) involved by a CD5/CD10 negative mature B-cell lymphoma (approximately 30% of cellularity)  -- MYD 88 is not detected so final diagnosis is marginal zone lymphoma. --PET scan from 07/13/2024 showed splenomegaly with abnormal accentuated splenic activity. Mildly heterogenous but not overtly accentuated background marrow activity int he skeleton. No adenopathy identified. No metabolically active bone lesions.  --Recommend weekly rituximab  therapy x 4 cycles started on 07/31/2024 PLAN: --Due for cycle 2, day 1 of weekly rituximab  therapy --Labs from today were reviewed *** --Due to infusion reaction with cycle 1, we will add pepcid  and solumedrol with his pre-meds today --Proceed with treatment today as planned --    No orders of the defined types were placed in this encounter.   All questions were answered. The patient knows to call the clinic with any problems, questions or concerns.   I have spent a total of 30 minutes minutes of face-to-face and non-face-to-face time, preparing to see the patient,, performing a medically appropriate examination, counseling and educating the patient, documenting clinical information in the electronic health record, independently interpreting results and communicating results to the  patient, and care coordination.   Johnston Police PA-C Dept of Hematology and Oncology [facility]  at [facility] Phone: [phone]

## 2024-08-07 ENCOUNTER — Inpatient Hospital Stay

## 2024-08-07 ENCOUNTER — Inpatient Hospital Stay: Admitting: Physician Assistant

## 2024-08-07 ENCOUNTER — Encounter: Payer: Self-pay | Admitting: Cardiology

## 2024-08-07 VITALS — BP 140/80 | HR 61 | Temp 97.6°F | Resp 16

## 2024-08-07 DIAGNOSIS — C8307 Small cell B-cell lymphoma, spleen: Secondary | ICD-10-CM

## 2024-08-07 DIAGNOSIS — Z5112 Encounter for antineoplastic immunotherapy: Secondary | ICD-10-CM | POA: Diagnosis not present

## 2024-08-07 DIAGNOSIS — D472 Monoclonal gammopathy: Secondary | ICD-10-CM

## 2024-08-07 DIAGNOSIS — D696 Thrombocytopenia, unspecified: Secondary | ICD-10-CM | POA: Diagnosis not present

## 2024-08-07 LAB — CMP (CANCER CENTER ONLY)
ALT: 13 U/L (ref 0–44)
AST: 18 U/L (ref 15–41)
Albumin: 4.1 g/dL (ref 3.5–5.0)
Alkaline Phosphatase: 101 U/L (ref 38–126)
Anion gap: 11 (ref 5–15)
BUN: 21 mg/dL (ref 8–23)
CO2: 24 mmol/L (ref 22–32)
Calcium: 8.9 mg/dL (ref 8.9–10.3)
Chloride: 106 mmol/L (ref 98–111)
Creatinine: 0.95 mg/dL (ref 0.61–1.24)
GFR, Estimated: >60 mL/min
Glucose, Bld: 143 mg/dL — ABNORMAL HIGH (ref 70–99)
Potassium: 4.6 mmol/L (ref 3.5–5.1)
Sodium: 141 mmol/L (ref 135–145)
Total Bilirubin: 0.6 mg/dL (ref 0.0–1.2)
Total Protein: 6.8 g/dL (ref 6.5–8.1)

## 2024-08-07 LAB — CBC WITH DIFFERENTIAL (CANCER CENTER ONLY)
Abs Immature Granulocytes: 0.01 K/uL (ref 0.00–0.07)
Basophils Absolute: 0 K/uL (ref 0.0–0.1)
Basophils Relative: 0 %
Eosinophils Absolute: 0.2 K/uL (ref 0.0–0.5)
Eosinophils Relative: 3 %
HCT: 33.2 % — ABNORMAL LOW (ref 39.0–52.0)
Hemoglobin: 11 g/dL — ABNORMAL LOW (ref 13.0–17.0)
Immature Granulocytes: 0 %
Lymphocytes Relative: 43 %
Lymphs Abs: 2.4 K/uL (ref 0.7–4.0)
MCH: 28.6 pg (ref 26.0–34.0)
MCHC: 33.1 g/dL (ref 30.0–36.0)
MCV: 86.2 fL (ref 80.0–100.0)
Monocytes Absolute: 0.5 K/uL (ref 0.1–1.0)
Monocytes Relative: 9 %
Neutro Abs: 2.5 K/uL (ref 1.7–7.7)
Neutrophils Relative %: 45 %
Platelet Count: 85 K/uL — ABNORMAL LOW (ref 150–400)
RBC: 3.85 MIL/uL — ABNORMAL LOW (ref 4.22–5.81)
RDW: 17 % — ABNORMAL HIGH (ref 11.5–15.5)
WBC Count: 5.6 K/uL (ref 4.0–10.5)
nRBC: 0 % (ref 0.0–0.2)

## 2024-08-07 LAB — LACTATE DEHYDROGENASE: LDH: 187 U/L (ref 105–235)

## 2024-08-07 MED ORDER — FAMOTIDINE IN NACL 20-0.9 MG/50ML-% IV SOLN
20.0000 mg | Freq: Once | INTRAVENOUS | Status: AC
  Administered 2024-08-07: 20 mg via INTRAVENOUS
  Filled 2024-08-07: qty 50

## 2024-08-07 MED ORDER — SODIUM CHLORIDE 0.9 % IV SOLN: INTRAVENOUS | Status: DC

## 2024-08-07 MED ORDER — SODIUM CHLORIDE 0.9 % IV SOLN
375.0000 mg/m2 | Freq: Once | INTRAVENOUS | Status: AC
  Administered 2024-08-07: 700 mg via INTRAVENOUS
  Filled 2024-08-07: qty 50

## 2024-08-07 MED ORDER — DIPHENHYDRAMINE HCL 25 MG PO CAPS
50.0000 mg | ORAL_CAPSULE | Freq: Once | ORAL | Status: AC
  Administered 2024-08-07: 50 mg via ORAL
  Filled 2024-08-07: qty 2

## 2024-08-07 MED ORDER — ACETAMINOPHEN 325 MG PO TABS
650.0000 mg | ORAL_TABLET | Freq: Once | ORAL | Status: AC
  Administered 2024-08-07: 650 mg via ORAL
  Filled 2024-08-07: qty 2

## 2024-08-07 MED ORDER — METHYLPREDNISOLONE SODIUM SUCC 125 MG IJ SOLR
62.5000 mg | Freq: Once | INTRAMUSCULAR | Status: AC
  Administered 2024-08-07: 62.5 mg via INTRAVENOUS
  Filled 2024-08-07: qty 2

## 2024-08-07 NOTE — Patient Instructions (Signed)
 [facility] - A DEPT OF [facility]  Discharge Instructions: Thank you for choosing [facility] to provide your oncology and hematology care.   If you have a lab appointment with the Cancer Center, please go directly to the Cancer Center and check in at the registration area.   Wear comfortable clothing and clothing appropriate for easy access to any Portacath or PICC line.   We strive to give you quality time with your provider. You may need to reschedule your appointment if you arrive late (15 or more minutes).  Arriving late affects you and other patients whose appointments are after yours.  Also, if you miss three or more appointments without notifying the office, you may be dismissed from the clinic at the provider's discretion.      For prescription refill requests, have your pharmacy contact our office and allow 72 hours for refills to be completed.    Today you received the following chemotherapy and/or immunotherapy agents rituxan      To help prevent nausea and vomiting after your treatment, we encourage you to take your nausea medication as directed.  BELOW ARE SYMPTOMS THAT SHOULD BE REPORTED IMMEDIATELY: *FEVER GREATER THAN 100.4 F (38 C) OR HIGHER *CHILLS OR SWEATING *NAUSEA AND VOMITING THAT IS NOT CONTROLLED WITH YOUR NAUSEA MEDICATION *UNUSUAL SHORTNESS OF BREATH *UNUSUAL BRUISING OR BLEEDING *URINARY PROBLEMS (pain or burning when urinating, or frequent urination) *BOWEL PROBLEMS (unusual diarrhea, constipation, pain near the anus) TENDERNESS IN MOUTH AND THROAT WITH OR WITHOUT PRESENCE OF ULCERS (sore throat, sores in mouth, or a toothache) UNUSUAL RASH, SWELLING OR PAIN  UNUSUAL VAGINAL DISCHARGE OR ITCHING   Items with * indicate a potential emergency and should be followed up as soon as possible or go to the Emergency Department if any problems should occur.  Please show the CHEMOTHERAPY ALERT CARD or IMMUNOTHERAPY  ALERT CARD at check-in to the Emergency Department and triage nurse.  Should you have questions after your visit or need to cancel or reschedule your appointment, please contact [facility] - A DEPT OF [facility]  Dept: [phone]  and follow the prompts.  Office hours are 8:00 a.m. to 4:30 p.m. Monday - Friday. Please note that voicemails left after 4:00 p.m. may not be returned until the following business day.  We are closed weekends and major holidays. You have access to a nurse at all times for urgent questions. Please call the main number to the clinic Dept: [phone] and follow the prompts.   For any non-urgent questions, you may also contact your provider using MyChart. We now offer e-Visits for anyone 2 and older to request care online for non-urgent symptoms. For details visit [email or web address].   Also download the MyChart app! Go to the app store, search "MyChart", open the app, select [place], and log in with your MyChart username and password.

## 2024-08-10 ENCOUNTER — Other Ambulatory Visit: Payer: Self-pay | Admitting: Hematology and Oncology

## 2024-08-10 ENCOUNTER — Inpatient Hospital Stay

## 2024-08-11 ENCOUNTER — Other Ambulatory Visit: Payer: Self-pay

## 2024-08-11 DIAGNOSIS — D472 Monoclonal gammopathy: Secondary | ICD-10-CM

## 2024-08-12 ENCOUNTER — Inpatient Hospital Stay

## 2024-08-13 ENCOUNTER — Other Ambulatory Visit: Payer: Self-pay | Admitting: Hematology and Oncology

## 2024-08-13 ENCOUNTER — Encounter

## 2024-08-13 ENCOUNTER — Ambulatory Visit

## 2024-08-13 DIAGNOSIS — C8307 Small cell B-cell lymphoma, spleen: Secondary | ICD-10-CM

## 2024-08-13 DIAGNOSIS — I639 Cerebral infarction, unspecified: Secondary | ICD-10-CM | POA: Diagnosis not present

## 2024-08-13 DIAGNOSIS — D472 Monoclonal gammopathy: Secondary | ICD-10-CM

## 2024-08-13 LAB — CUP PACEART REMOTE DEVICE CHECK
Date Time Interrogation Session: 20251224231302
Implantable Pulse Generator Implant Date: 20201216

## 2024-08-14 ENCOUNTER — Inpatient Hospital Stay

## 2024-08-14 VITALS — BP 137/70 | HR 59 | Temp 97.6°F | Resp 20

## 2024-08-14 DIAGNOSIS — C8307 Small cell B-cell lymphoma, spleen: Secondary | ICD-10-CM

## 2024-08-14 DIAGNOSIS — Z5112 Encounter for antineoplastic immunotherapy: Secondary | ICD-10-CM | POA: Diagnosis not present

## 2024-08-14 LAB — CMP (CANCER CENTER ONLY)
ALT: 10 U/L (ref 0–44)
AST: 18 U/L (ref 15–41)
Albumin: 4.1 g/dL (ref 3.5–5.0)
Alkaline Phosphatase: 92 U/L (ref 38–126)
Anion gap: 11 (ref 5–15)
BUN: 21 mg/dL (ref 8–23)
CO2: 24 mmol/L (ref 22–32)
Calcium: 8.8 mg/dL — ABNORMAL LOW (ref 8.9–10.3)
Chloride: 108 mmol/L (ref 98–111)
Creatinine: 0.9 mg/dL (ref 0.61–1.24)
GFR, Estimated: >60 mL/min
Glucose, Bld: 142 mg/dL — ABNORMAL HIGH (ref 70–99)
Potassium: 4.2 mmol/L (ref 3.5–5.1)
Sodium: 142 mmol/L (ref 135–145)
Total Bilirubin: 0.8 mg/dL (ref 0.0–1.2)
Total Protein: 6.8 g/dL (ref 6.5–8.1)

## 2024-08-14 LAB — CBC WITH DIFFERENTIAL (CANCER CENTER ONLY)
Abs Immature Granulocytes: 0.01 K/uL (ref 0.00–0.07)
Basophils Absolute: 0 K/uL (ref 0.0–0.1)
Basophils Relative: 0 %
Eosinophils Absolute: 0.2 K/uL (ref 0.0–0.5)
Eosinophils Relative: 3 %
HCT: 34.3 % — ABNORMAL LOW (ref 39.0–52.0)
Hemoglobin: 11.3 g/dL — ABNORMAL LOW (ref 13.0–17.0)
Immature Granulocytes: 0 %
Lymphocytes Relative: 26 %
Lymphs Abs: 1.3 K/uL (ref 0.7–4.0)
MCH: 28.5 pg (ref 26.0–34.0)
MCHC: 32.9 g/dL (ref 30.0–36.0)
MCV: 86.4 fL (ref 80.0–100.0)
Monocytes Absolute: 0.4 K/uL (ref 0.1–1.0)
Monocytes Relative: 9 %
Neutro Abs: 3 K/uL (ref 1.7–7.7)
Neutrophils Relative %: 62 %
Platelet Count: 113 K/uL — ABNORMAL LOW (ref 150–400)
RBC: 3.97 MIL/uL — ABNORMAL LOW (ref 4.22–5.81)
RDW: 17.2 % — ABNORMAL HIGH (ref 11.5–15.5)
WBC Count: 4.9 K/uL (ref 4.0–10.5)
nRBC: 0 % (ref 0.0–0.2)

## 2024-08-14 LAB — LACTATE DEHYDROGENASE: LDH: 187 U/L (ref 105–235)

## 2024-08-14 MED ORDER — ACETAMINOPHEN 325 MG PO TABS
650.0000 mg | ORAL_TABLET | Freq: Once | ORAL | Status: AC
  Administered 2024-08-14: 650 mg via ORAL
  Filled 2024-08-14: qty 2

## 2024-08-14 MED ORDER — SODIUM CHLORIDE 0.9 % IV SOLN
375.0000 mg/m2 | Freq: Once | INTRAVENOUS | Status: AC
  Administered 2024-08-14: 700 mg via INTRAVENOUS
  Filled 2024-08-14: qty 50

## 2024-08-14 MED ORDER — METHYLPREDNISOLONE SODIUM SUCC 125 MG IJ SOLR
62.5000 mg | Freq: Once | INTRAMUSCULAR | Status: AC
  Administered 2024-08-14: 62.5 mg via INTRAVENOUS
  Filled 2024-08-14: qty 2

## 2024-08-14 MED ORDER — SODIUM CHLORIDE 0.9 % IV SOLN: INTRAVENOUS | Status: DC

## 2024-08-14 MED ORDER — FAMOTIDINE IN NACL 20-0.9 MG/50ML-% IV SOLN
20.0000 mg | Freq: Once | INTRAVENOUS | Status: AC
  Administered 2024-08-14: 20 mg via INTRAVENOUS
  Filled 2024-08-14: qty 50

## 2024-08-14 MED ORDER — DIPHENHYDRAMINE HCL 25 MG PO CAPS
50.0000 mg | ORAL_CAPSULE | Freq: Once | ORAL | Status: AC
  Administered 2024-08-14: 50 mg via ORAL
  Filled 2024-08-14: qty 2

## 2024-08-14 NOTE — Progress Notes (Signed)
 Remote Loop Recorder Transmission

## 2024-08-14 NOTE — Patient Instructions (Signed)
 [facility] - A DEPT OF [facility]  Discharge Instructions: Thank you for choosing [facility] to provide your oncology and hematology care.   If you have a lab appointment with the Cancer Center, please go directly to the Cancer Center and check in at the registration area.   Wear comfortable clothing and clothing appropriate for easy access to any Portacath or PICC line.   We strive to give you quality time with your provider. You may need to reschedule your appointment if you arrive late (15 or more minutes).  Arriving late affects you and other patients whose appointments are after yours.  Also, if you miss three or more appointments without notifying the office, you may be dismissed from the clinic at the provider's discretion.      For prescription refill requests, have your pharmacy contact our office and allow 72 hours for refills to be completed.    Today you received the following chemotherapy and/or immunotherapy agents Rituximab      To help prevent nausea and vomiting after your treatment, we encourage you to take your nausea medication as directed.  BELOW ARE SYMPTOMS THAT SHOULD BE REPORTED IMMEDIATELY: *FEVER GREATER THAN 100.4 F (38 C) OR HIGHER *CHILLS OR SWEATING *NAUSEA AND VOMITING THAT IS NOT CONTROLLED WITH YOUR NAUSEA MEDICATION *UNUSUAL SHORTNESS OF BREATH *UNUSUAL BRUISING OR BLEEDING *URINARY PROBLEMS (pain or burning when urinating, or frequent urination) *BOWEL PROBLEMS (unusual diarrhea, constipation, pain near the anus) TENDERNESS IN MOUTH AND THROAT WITH OR WITHOUT PRESENCE OF ULCERS (sore throat, sores in mouth, or a toothache) UNUSUAL RASH, SWELLING OR PAIN  UNUSUAL VAGINAL DISCHARGE OR ITCHING   Items with * indicate a potential emergency and should be followed up as soon as possible or go to the Emergency Department if any problems should occur.  Please show the CHEMOTHERAPY ALERT CARD or IMMUNOTHERAPY  ALERT CARD at check-in to the Emergency Department and triage nurse.  Should you have questions after your visit or need to cancel or reschedule your appointment, please contact [facility]  Dept: [phone]  and follow the prompts.  Office hours are 8:00 a.m. to 4:30 p.m. Monday - Friday. Please note that voicemails left after 4:00 p.m. may not be returned until the following business day.  We are closed weekends and major holidays. You have access to a nurse at all times for urgent questions. Please call the main number to the clinic Dept: [phone] and follow the prompts.   For any non-urgent questions, you may also contact your provider using MyChart. We now offer e-Visits for anyone 78 and older to request care online for non-urgent symptoms. For details visit [email or web address].   Also download the MyChart app! Go to the app store, search "MyChart", open the app, select [facility], and log in with your MyChart username and password.

## 2024-08-18 ENCOUNTER — Ambulatory Visit: Payer: Self-pay | Admitting: Cardiology

## 2024-08-21 ENCOUNTER — Inpatient Hospital Stay: Attending: Hematology and Oncology

## 2024-08-21 ENCOUNTER — Telehealth: Payer: Self-pay | Admitting: Hematology and Oncology

## 2024-08-21 VITALS — BP 131/70 | HR 52 | Temp 97.8°F | Resp 14 | Wt 171.9 lb

## 2024-08-21 DIAGNOSIS — Z5112 Encounter for antineoplastic immunotherapy: Secondary | ICD-10-CM | POA: Insufficient documentation

## 2024-08-21 DIAGNOSIS — C8307 Small cell B-cell lymphoma, spleen: Secondary | ICD-10-CM | POA: Diagnosis present

## 2024-08-21 DIAGNOSIS — D472 Monoclonal gammopathy: Secondary | ICD-10-CM

## 2024-08-21 DIAGNOSIS — Z79899 Other long term (current) drug therapy: Secondary | ICD-10-CM | POA: Insufficient documentation

## 2024-08-21 LAB — CMP (CANCER CENTER ONLY)
ALT: 14 U/L (ref 0–44)
AST: 20 U/L (ref 15–41)
Albumin: 4.2 g/dL (ref 3.5–5.0)
Alkaline Phosphatase: 93 U/L (ref 38–126)
Anion gap: 11 (ref 5–15)
BUN: 23 mg/dL (ref 8–23)
CO2: 24 mmol/L (ref 22–32)
Calcium: 9.5 mg/dL (ref 8.9–10.3)
Chloride: 106 mmol/L (ref 98–111)
Creatinine: 0.91 mg/dL (ref 0.61–1.24)
GFR, Estimated: >60 mL/min
Glucose, Bld: 124 mg/dL — ABNORMAL HIGH (ref 70–99)
Potassium: 4.4 mmol/L (ref 3.5–5.1)
Sodium: 141 mmol/L (ref 135–145)
Total Bilirubin: 0.8 mg/dL (ref 0.0–1.2)
Total Protein: 7.1 g/dL (ref 6.5–8.1)

## 2024-08-21 LAB — CBC WITH DIFFERENTIAL (CANCER CENTER ONLY)
Abs Immature Granulocytes: 0.01 K/uL (ref 0.00–0.07)
Basophils Absolute: 0 K/uL (ref 0.0–0.1)
Basophils Relative: 0 %
Eosinophils Absolute: 0.1 K/uL (ref 0.0–0.5)
Eosinophils Relative: 3 %
HCT: 36.7 % — ABNORMAL LOW (ref 39.0–52.0)
Hemoglobin: 12.3 g/dL — ABNORMAL LOW (ref 13.0–17.0)
Immature Granulocytes: 0 %
Lymphocytes Relative: 32 %
Lymphs Abs: 1.5 K/uL (ref 0.7–4.0)
MCH: 29 pg (ref 26.0–34.0)
MCHC: 33.5 g/dL (ref 30.0–36.0)
MCV: 86.6 fL (ref 80.0–100.0)
Monocytes Absolute: 0.6 K/uL (ref 0.1–1.0)
Monocytes Relative: 12 %
Neutro Abs: 2.5 K/uL (ref 1.7–7.7)
Neutrophils Relative %: 53 %
Platelet Count: 131 K/uL — ABNORMAL LOW (ref 150–400)
RBC: 4.24 MIL/uL (ref 4.22–5.81)
RDW: 17.6 % — ABNORMAL HIGH (ref 11.5–15.5)
WBC Count: 4.8 K/uL (ref 4.0–10.5)
nRBC: 0 % (ref 0.0–0.2)

## 2024-08-21 LAB — LACTATE DEHYDROGENASE: LDH: 210 U/L (ref 105–235)

## 2024-08-21 MED ORDER — DIPHENHYDRAMINE HCL 25 MG PO CAPS
50.0000 mg | ORAL_CAPSULE | Freq: Once | ORAL | Status: AC
  Administered 2024-08-21: 50 mg via ORAL
  Filled 2024-08-21: qty 2

## 2024-08-21 MED ORDER — SODIUM CHLORIDE 0.9 % IV SOLN
375.0000 mg/m2 | Freq: Once | INTRAVENOUS | Status: AC
  Administered 2024-08-21: 700 mg via INTRAVENOUS
  Filled 2024-08-21: qty 50

## 2024-08-21 MED ORDER — SODIUM CHLORIDE 0.9 % IV SOLN
375.0000 mg/m2 | Freq: Once | INTRAVENOUS | Status: DC
  Filled 2024-08-21: qty 70

## 2024-08-21 MED ORDER — FAMOTIDINE IN NACL 20-0.9 MG/50ML-% IV SOLN
20.0000 mg | Freq: Once | INTRAVENOUS | Status: AC
  Administered 2024-08-21: 20 mg via INTRAVENOUS
  Filled 2024-08-21: qty 50

## 2024-08-21 MED ORDER — METHYLPREDNISOLONE SODIUM SUCC 125 MG IJ SOLR
62.5000 mg | Freq: Once | INTRAMUSCULAR | Status: AC
  Administered 2024-08-21: 62.5 mg via INTRAVENOUS
  Filled 2024-08-21: qty 2

## 2024-08-21 MED ORDER — ACETAMINOPHEN 325 MG PO TABS
650.0000 mg | ORAL_TABLET | Freq: Once | ORAL | Status: AC
  Administered 2024-08-21: 650 mg via ORAL
  Filled 2024-08-21: qty 2

## 2024-08-21 MED ORDER — SODIUM CHLORIDE 0.9 % IV SOLN: INTRAVENOUS | Status: DC

## 2024-08-21 NOTE — Patient Instructions (Signed)
 [facility] - A DEPT OF [facility]  Discharge Instructions: Thank you for choosing [facility] to provide your oncology and hematology care.   If you have a lab appointment with the Cancer Center, please go directly to the Cancer Center and check in at the registration area.   Wear comfortable clothing and clothing appropriate for easy access to any Portacath or PICC line.   We strive to give you quality time with your provider. You may need to reschedule your appointment if you arrive late (15 or more minutes).  Arriving late affects you and other patients whose appointments are after yours.  Also, if you miss three or more appointments without notifying the office, you may be dismissed from the clinic at the providers discretion.      For prescription refill requests, have your pharmacy contact our office and allow 72 hours for refills to be completed.    Today you received the following chemotherapy and/or immunotherapy agents: Rituximab -pvvr (Ruxience )      To help prevent nausea and vomiting after your treatment, we encourage you to take your nausea medication as directed.  BELOW ARE SYMPTOMS THAT SHOULD BE REPORTED IMMEDIATELY: *FEVER GREATER THAN 100.4 F (38 C) OR HIGHER *CHILLS OR SWEATING *NAUSEA AND VOMITING THAT IS NOT CONTROLLED WITH YOUR NAUSEA MEDICATION *UNUSUAL SHORTNESS OF BREATH *UNUSUAL BRUISING OR BLEEDING *URINARY PROBLEMS (pain or burning when urinating, or frequent urination) *BOWEL PROBLEMS (unusual diarrhea, constipation, pain near the anus) TENDERNESS IN MOUTH AND THROAT WITH OR WITHOUT PRESENCE OF ULCERS (sore throat, sores in mouth, or a toothache) UNUSUAL RASH, SWELLING OR PAIN  UNUSUAL VAGINAL DISCHARGE OR ITCHING   Items with * indicate a potential emergency and should be followed up as soon as possible or go to the Emergency Department if any problems should occur.  Please show the CHEMOTHERAPY ALERT CARD  or IMMUNOTHERAPY ALERT CARD at check-in to the Emergency Department and triage nurse.  Should you have questions after your visit or need to cancel or reschedule your appointment, please contact [facility]  Dept: [phone]  and follow the prompts.  Office hours are 8:00 a.m. to 4:30 p.m. Monday - Friday. Please note that voicemails left after 4:00 p.m. may not be returned until the following business day.  We are closed weekends and major holidays. You have access to a nurse at all times for urgent questions. Please call the main number to the clinic Dept: [phone] and follow the prompts.   For any non-urgent questions, you may also contact your provider using MyChart. We now offer e-Visits for anyone 4 and older to request care online for non-urgent symptoms. For details visit [email or web address].   Also download the MyChart app! Go to the app store, search MyChart, open the app, select [facility], and log in with your MyChart username and password.

## 2024-08-21 NOTE — Telephone Encounter (Signed)
 I spoke with patient and he is aware of lab and MD visit on 09/16/2024.

## 2024-09-12 ENCOUNTER — Encounter

## 2024-09-13 ENCOUNTER — Ambulatory Visit: Attending: Cardiology

## 2024-09-13 DIAGNOSIS — I639 Cerebral infarction, unspecified: Secondary | ICD-10-CM | POA: Diagnosis not present

## 2024-09-14 ENCOUNTER — Ambulatory Visit

## 2024-09-14 LAB — CUP PACEART REMOTE DEVICE CHECK
Date Time Interrogation Session: 20260124230807
Implantable Pulse Generator Implant Date: 20201216

## 2024-09-15 ENCOUNTER — Ambulatory Visit: Payer: Self-pay | Admitting: Cardiology

## 2024-09-15 ENCOUNTER — Other Ambulatory Visit: Payer: Self-pay | Admitting: Hematology and Oncology

## 2024-09-15 DIAGNOSIS — C8307 Small cell B-cell lymphoma, spleen: Secondary | ICD-10-CM

## 2024-09-15 NOTE — Progress Notes (Unsigned)
 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 Federico  06/03/2024: DG bone met survey showed small lucencies within the proximal right humerus, distal right femur, and proximal right tibia  06/25/2024: CT bone marrow biopsy showed 70% cellular bone marrow with 30% lymphoplasmacytic mature B cells. MYD 88 not detected.  07/21/2024: Cycle 1, Day 1 of weekly Rituximab  08/07/2024: Cycle 2, Day 1 of weekly Rituximab  08/14/2024: Cycle 3, Day 1 of weekly Rituximab  08/21/2024: Cycle 4, Day 1 of weekly Rituximab   Interval History:  Jordan [name] 84 y.o. male with medical history significant for a B-cell lymphoma who presents for a follow up visit. The patient's last visit was on 07/21/24. He is accompanied by his wife for the visit.  Jordan [name] reports he froze up during the first infusion of rituximab  but with the subsequent doses he had no trouble.  He reports he is been feeling great since those infusions and has had no trouble since.  His wife reports he has more energy and is brighter.  He notes he is had no issues with runny nose, sore throat, cough.  He denies any fevers, chills, sweats.  He has had no issues with lightheadedness, dizziness, shortness of breath.  As matter-of-fact his shortness of breath has improved and he is able to walk up the stairs without needing to catch his breath.  He also reports that he feels like his spleen has retreated up under his rib  cage and he is no longer able to feel it.  He reports that overall he feels great and has no additional questions concerns or complaints today.  Today we discussed the steps moving forward including repeat imaging and surveillance.  We discussed the good prognosis and likelihood that he may never require treatment again.  MEDICAL HISTORY:  Past Medical History:  Diagnosis Date   Carotid stenosis, asymptomatic 07/27/2011   Cryptogenic stroke (HCC) 04/2019   Minimal carotid disease, normal TTE/TEE; LINQ ILR Placed -> no documented A-fib   DDD (degenerative disc disease)    ED (erectile dysfunction)    Essential hypertension 01/29/2017   GERD (gastroesophageal reflux disease)    History of hiatal hernia    History of kidney stones    passed stones - no surgery required   Hyperlipidemia with target low density lipoprotein (LDL) cholesterol less than 70 mg/dL 91/87/7984   Impaired fasting glucose    Knee pain    bilateral   Left main coronary artery disease 03/18/2017   Cardiac Cath: 55% dLM, tandem p-mLAD 75% --> referred for CABG.   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 Lucas @ [name]):  LIMA-LAD, SVG-D2, SVG-OM, SVG-PDA (endoscopic SVG harvesting from right leg).   Shoulder pain    Wears hearing aid     SURGICAL HISTORY: Past Surgical History:  Procedure Laterality Date   APPENDECTOMY  2020   BUBBLE STUDY  08/05/2019   Procedure: BUBBLE STUDY;  Surgeon: Lonni Slain, MD;  Location: [name];  Service: Cardiovascular;;   Cardiac Stress Test  2008   Normal   CARPAL TUNNEL RELEASE Left 1998   CARPAL TUNNEL RELEASE Right 2008   CHOLECYSTECTOMY, LAPAROSCOPIC  2021   in [place]   COLONOSCOPY  06/2013   [facility] - polyps   CORONARY ARTERY BYPASS GRAFT N/A 04/04/2017   Procedure: CORONARY ARTERY BYPASS  GRAFTING x 4  LIMA-LAD, SVG-D2, SVG-OM, SVG-PDA (endoscopic SVG harvesting from right leg);  Surgeon: Lucas Dorise POUR, MD;  Location: [facility];  Service: Open Heart Surgery;  Laterality: N/A;   EXTRACORPOREAL SHOCK WAVE LITHOTRIPSY Right 10/23/2018   Procedure: EXTRACORPOREAL SHOCK WAVE LITHOTRIPSY (ESWL);  Surgeon: Gaston Hamilton, MD;  Location: [facility] ORS;  Service: Urology;  Laterality: Right;   HERNIA REPAIR Left    inguinal   HIATAL HERNIA REPAIR     no surgery per pt   INCISION AND DRAINAGE WOUND WITH NAILBED REPAIR Left 05/28/2022   Procedure: Irrigation and debridement and open repair bone nail plate nailbed and soft tissue structures;  Surgeon: Camella Fallow, MD;  Location: [facility] OR;  Service: Orthopedics;  Laterality: Left;  1 hr Block with IV Sedation   KNEE CARTILAGE SURGERY Bilateral    LEFT HEART CATH AND CORONARY ANGIOGRAPHY N/A 03/18/2017   Procedure: Left Heart Cath and Coronary Angiography;  Surgeon: Anner Alm ORN, MD;  Location: [facility] INVASIVE CV LAB;  Service: Cardiovascular:  55% dLM, p-mLAD 75% - mLAD 75%.  EF 55-65%. Mod non-obstructive RCA disease --> referred for CABG   LOOP RECORDER INSERTION N/A 08/05/2019   Procedure: LOOP RECORDER INSERTION;  Surgeon: Inocencio Soyla Lunger, MD;  Location: [facility] INVASIVE CV LAB;  Service: Cardiovascular;  Laterality: N/A;   MEDIAL PARTIAL KNEE REPLACEMENT Right 2010   Partial right knee replacement   NM MYOVIEW  LTD  01/2017   INTERMEDIATE RISK: Exercise tolerance was good at 10 minutes, however, fatigue and dyspnea were reporte-d -> hypertensive response to exercise.  3mm Horizontal ST depressions noted during stress in the II, III, aVF, V6, V5 and V4 leads c/w ischemia.   Small siize, moderate severity perfusion defect  mid to distal anterior wall perfusion defect suggestive of ischemia..    NM MYOVIEW  LTD  05/07/2022   Treadmill Myoview : Average exercise capacity (7:00 min:s; 8.5 METS). Normal HR response to exercise. Hypertensive response to  exercise.  EF 55 to 65% (60%).  Normal size and function.  NO EVIDENCE OF ISCHEMIA OR INFARCTION -> NORMAL/LOW RISK   PARTIAL KNEE ARTHROPLASTY Left 10/24/2023   Procedure: ARTHROPLASTY, KNEE, UNICOMPARTMENTAL;  Surgeon: Ernie Cough, MD;  Location: [facility] ORS;  Service: Orthopedics;  Laterality: Left;   ROTATOR CUFF REPAIR Left 2008   SHOULDER ARTHROSCOPY W/ ROTATOR CUFF REPAIR Right 06/15/2016   TEE WITHOUT CARDIOVERSION N/A 04/04/2017   Procedure: TRANSESOPHAGEAL ECHOCARDIOGRAM (TEE);  Surgeon: Lucas Dorise POUR, MD;  Location: [facility] OR;  Service: Open Heart Surgery;  Laterality: N/A;   TEE WITHOUT CARDIOVERSION N/A 08/05/2019   Procedure: TRANSESOPHAGEAL ECHOCARDIOGRAM (TEE);  Surgeon: Lonni Slain, MD;  Location: [name]:: EF 60 to 65%, no RWMA.  Normal atria, normal RV.  Normal valves.  No vegetation noted.  No LAA thrombus.  Negative bubble study.  Mild-diffuse aortic atherosclerosis   TONSILLECTOMY     TRANSTHORACIC ECHOCARDIOGRAM  08/20/2021   a) 05/21/2019: EF 60 to 65%.  No LVH.  Indeterminate LV filling.  No RWMA.  Normal  RV.  Normal atria.  Normal aortic and mitral valves other than mild aortic sclerosis.  Normal RVSP and RAP.;; b) 08/20/2021:    SOCIAL HISTORY: Social History   Socioeconomic History   Marital status: Married    Spouse name: Not on file   Number of children: 2   Years of education: Not on file   Highest education level: Master's degree (e.g., MA, MS, MEng, MEd, MSW, MBA)  Occupational History   Occupation: retired    Comment: airline pilot  Tobacco Use   Smoking status: Never   Smokeless tobacco: Never  Vaping Use   Vaping status: Never Used  Substance and Sexual Activity   Alcohol  use: Yes    Alcohol /week: 7.0 standard drinks of alcohol     Types: 7 Glasses of wine per week   Drug use: No   Sexual activity: Yes  Other Topics Concern   Not on file  Social History Narrative   Alm is a very pleasant married gentleman with 2 children Arleen and [place])  each with 3 children/total 6 grandchildren. He is currently retired now from his original job in [employer], however he still serves on [employer] at [employer] and recently stepped down from [employer] at [employer].   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    For the last several months he has been off caffeine and wine because of GI upset symptoms and palpitations. Also trying to lose weight.     He used to drink maybe 2 glasses of wine at night, and he is subsequently all quit alcohol .      He is extremely active working out routinely doing squatting exercises and rides his bicycle or walks just about every day.  He does 300 push-ups to 3 times a week.  Over the summer he also enjoys kayaking (paddling anywhere from 45 minutes to 1-1/2 hours).   At the end of the summer, he actually did a 3-1/2-hour paddle kayaking around [id number] where they have a beach house.  He did avoid going out into the open ocean to avoid the waves because he had no one paddling with him.  He did feel little bit tired after that and was worn out for the next day, but denied any chest pain or pressure.  No exertional dyspnea.       He and his wife spend Memorial Day to [place] at [employer] ([place]umably on the [employer]).  He then returned to [name] for the following winter.  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       He also was very fastidious on his diet.  He tries to cut down his caffeine intake because he had some palpitations off and on.  He says he is always high energy but does note a difference from when he was 84 years old compared to being 86.  He has had nothing like the symptoms he had prior to his CABG and is done remarkably well since his CABG.   Social Drivers of Health   Tobacco Use: Low Risk (06/25/2024)   Patient History    Smoking Tobacco Use: Never    Smokeless Tobacco Use:  Never    Passive Exposure: Not on file  Financial Resource Strain: Not on file  Food Insecurity: No Food Insecurity (05/27/2024)   Epic    Worried About Radiation Protection Practitioner of [employer]  in the Last Year: Never true    Ran Out of Food in the Last Year: Never true  Transportation Needs: No Transportation Needs (05/27/2024)   Epic    Lack of Transportation (Medical): No    Lack of Transportation (Non-Medical): No  Physical Activity: Not on file  Stress: Not on file  Social Connections: Unknown (06/07/2023)   Received from [name]   Social Network    Social Network: Not on file  Intimate Partner Violence: Not At Risk (05/27/2024)   Epic    Fear of Current or Ex-Partner: No    Emotionally Abused: No    Physically Abused: No    Sexually Abused: No  Depression (PHQ2-9): Low Risk (08/21/2024)   Depression (PHQ2-9)    PHQ-2 Score: 0  Alcohol  Screen: Not on file  Housing: Unknown (05/27/2024)   Epic    Unable to Pay for Housing in the Last Year: No    Number of Times Moved in the Last Year: Not on file    Homeless in the Last Year: No  Utilities: Not At Risk (05/27/2024)   Epic    Threatened with loss of utilities: No  Health Literacy: Not on file    FAMILY HISTORY: Family History  Problem Relation Age of Onset   Heart failure Mother    Cancer Mother        breast   Coronary artery disease Father        CABG   Heart attack Father        Long-term smoker.   Hyperlipidemia Father    Hyperlipidemia Brother    Hypertension Brother    Hyperlipidemia Son    Healthy Daughter    Colon cancer Neg Hx    Colon polyps Neg Hx    Rectal cancer Neg Hx    Stomach cancer Neg Hx     ALLERGIES:  is allergic to oxycodone , caffeine, hydrocodone , nexium [esomeprazole], other, rituximab , and crestor [rosuvastatin calcium ].  MEDICATIONS:  Current Outpatient Medications  Medication Sig Dispense Refill   Ascorbic Acid (VITAMIN C) 1000 MG tablet Take 1,000 mg by mouth 2 (two) times daily.     aspirin   EC 81 MG tablet Take 81 mg by mouth at bedtime. (Patient taking differently: Take 81 mg by mouth at bedtime. Takes every other evening)     B Complex Vitamins (B COMPLEX 1 PO) Take 1 capsule by mouth daily.     Cholecalciferol (VITAMIN D3 MAXIMUM STRENGTH) 125 MCG (5000 UT) capsule Take 5,000 Units by mouth at bedtime.     CHOLINE PO Take 800 mg by mouth 2 (two) times daily.     cyanocobalamin  (VITAMIN B12) 1000 MCG tablet Take 1,000 mcg by mouth at bedtime.     EPINEPHrine  0.3 mg/0.3 mL IJ SOAJ injection Inject 0.3 mg into the muscle as needed for anaphylaxis.     ezetimibe  (ZETIA ) 10 MG tablet Take 10 mg by mouth daily.      FIBER PO Take 1 tablet by mouth 2 (two) times daily.     MAGNESIUM  GLYCINATE PO Take 1 tablet by mouth at bedtime.     Nutritional Supplements (KETO PO) Take 1 capsule by mouth daily. Medication is called Keto DHEA 7     OVER THE COUNTER MEDICATION Take 1 tablet by mouth 2 (two) times daily. Total Restor supplement     pantoprazole  (PROTONIX ) 40 MG tablet Take 40 mg by mouth daily.     pravastatin  (PRAVACHOL ) 20 MG tablet Take 20 mg  by mouth at bedtime.     Probiotic Product (ALIGN) 4 MG CAPS Take 4 mg by mouth at bedtime.     tadalafil (ADCIRCA/CIALIS) 20 MG tablet Take 20 mg by mouth at bedtime.     Testosterone  25 MG/2.5GM (1%) GEL 1 mL by Other route See admin instructions. APPLY 4 CLICKS (1 ML) TO ALTERNATING SHOULDERS DAILY     valsartan (DIOVAN) 80 MG tablet Take 40 mg by mouth 2 (two) times daily.     Zinc 30 MG TABS Take 1 tablet by mouth daily.     No current facility-administered medications for this visit.    REVIEW OF SYSTEMS:   Constitutional: ( - ) fevers, ( - )  chills , ( - ) night sweats Eyes: ( - ) blurriness of vision, ( - ) double vision, ( - ) watery eyes Ears, nose, mouth, throat, and face: ( - ) mucositis, ( - ) sore throat Respiratory: ( - ) cough, ( - ) dyspnea, ( - ) wheezes Cardiovascular: ( - ) palpitation, ( - ) chest discomfort, ( - )  lower extremity swelling Gastrointestinal:  ( - ) nausea, ( - ) heartburn, ( - ) change in bowel habits Skin: ( - ) abnormal skin rashes Lymphatics: ( - ) new lymphadenopathy, ( - ) easy bruising Neurological: ( - ) numbness, ( - ) tingling, ( - ) new weaknesses Behavioral/Psych: ( - ) mood change, ( - ) new changes  All other systems were reviewed with the patient and are negative.  PHYSICAL EXAMINATION: ECOG PERFORMANCE STATUS: 1 - Symptomatic but completely ambulatory  Vitals:   09/16/24 0834  BP: (!) 123/58  Pulse: (!) 55  Resp: 18  Temp: 98.4 F (36.9 C)  SpO2: 97%    Filed Weights   09/16/24 0834  Weight: 176 lb 6.4 oz (80 kg)     Day 1, Cycle 2 08/07/24  Temp 97.6 F (36.4 C)  Temp src Oral  Pulse 61  Resp 16  BP 140/80 !   GENERAL: Well-appearing elderly Caucasian male alert, no distress and comfortable SKIN: skin color, texture, turgor are normal, no rashes or significant lesions EYES: conjunctiva are pink and non-injected, sclera clear LUNGS: clear to auscultation and percussion with normal breathing effort HEART: regular rate & rhythm and no murmurs and no lower extremity edema Musculoskeletal: no cyanosis of digits and no clubbing  PSYCH: alert & oriented x 3, fluent speech NEURO: no focal motor/sensory deficits  LABORATORY DATA:  I have reviewed the data as listed    Latest Ref Rng & Units 09/16/2024    8:17 AM 08/21/2024   10:04 AM 08/14/2024    8:06 AM  CBC  WBC 4.0 - 10.5 K/uL 3.6  4.8  4.9   Hemoglobin 13.0 - 17.0 g/dL 86.1  87.6  88.6   Hematocrit 39.0 - 52.0 % 40.7  36.7  34.3   Platelets 150 - 400 K/uL 140  131  113        Latest Ref Rng & Units 09/16/2024    8:17 AM 08/21/2024   10:04 AM 08/14/2024    8:06 AM  CMP  Glucose 70 - 99 mg/dL 896  875  857   BUN 8 - 23 mg/dL 23  23  21    Creatinine 0.61 - 1.24 mg/dL 9.04  9.08  9.09   Sodium 135 - 145 mmol/L 140  141  142   Potassium 3.5 - 5.1 mmol/L 4.3  4.4  4.2  Chloride 98 - 111  mmol/L 104  106  108   CO2 22 - 32 mmol/L 27  24  24    Calcium  8.9 - 10.3 mg/dL 9.3  9.5  8.8   Total Protein 6.5 - 8.1 g/dL 7.1  7.1  6.8   Total Bilirubin 0.0 - 1.2 mg/dL 0.7  0.8  0.8   Alkaline Phos 38 - 126 U/L 73  93  92   AST 15 - 41 U/L 21  20  18    ALT 0 - 44 U/L 15  14  10      Lab Results  Component Value Date   MPROTEIN 0.3 (H) 05/27/2024   Lab Results  Component Value Date   KPAFRELGTCHN 30.5 (H) 05/27/2024   LAMBDASER 7.3 05/27/2024   KAPLAMBRATIO 4.77 06/04/2024   KAPLAMBRATIO 4.18 (H) 05/27/2024     RADIOGRAPHIC STUDIES: CUP PACEART REMOTE DEVICE CHECK Result Date: 09/14/2024 ILR summary report received. Battery status OK. Normal device function. No new symptom, tachy, brady, or pause episodes. No new AF episodes. Monthly summary reports and ROV/PRN CCC, CVRS   ASSESSMENT & PLAN Shaquil Ronit Marczak is a 84 y.o. male for recently diagnosed splenic marginal zone lymphoma who presents for a follow up visit.  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 IFE showed IgM monoclonal protein with kappa light chain specificity. Kappa light chain was elevated at 30.5 with ratio of 4.18.  --Bone marrow biopsy from 06/25/2024 showed hypercellular bone marrow (70%) involved by a CD5/CD10 negative mature B-cell lymphoma (approximately 30% of cellularity)  -- MYD 88 is not detected so final diagnosis is marginal zone lymphoma. --PET scan from 07/13/2024 showed splenomegaly with abnormal accentuated splenic activity. Mildly heterogenous but not overtly accentuated background marrow activity int he skeleton. No adenopathy identified. No metabolically active bone lesions.  --Recommend weekly rituximab  therapy x 4 cycles started on 07/31/2024 PLAN: --completed weekly rituximab  therapy x 4 doses, last dose on 08/21/2024.  --Labs today show WBC 3.6 ,Hgb 13.8, MCV 86.6, Plt 140    --recommend PET CT scan in 1 month ( Feb 2026)  --RTC in April 2026 with labs.   Orders Placed This Encounter  Procedures   NM PET Image Restag (PS) Skull Base To Thigh    Standing Status:   Future    Expected Date:   10/12/2024    Expiration Date:   09/16/2025    If indicated for the ordered procedure, I authorize the administration of a radiopharmaceutical per Radiology protocol:   Yes    Preferred imaging location?:   [facility]   All questions were answered. The patient knows to call the clinic with any problems, questions or concerns.  I have spent a total of 30 minutes minutes of face-to-face and non-face-to-face time, preparing to see the patient,, performing a medically appropriate examination, counseling and educating the patient, documenting clinical information in the electronic health record, independently interpreting results and communicating results to the patient, and care coordination.   Norleen IVAR Kidney, MD Department of Hematology/Oncology [name] Phone: [phone] Pager: [phone] Email: [email or web address]   "

## 2024-09-16 ENCOUNTER — Inpatient Hospital Stay: Admitting: Hematology and Oncology

## 2024-09-16 ENCOUNTER — Inpatient Hospital Stay

## 2024-09-16 VITALS — BP 123/58 | HR 55 | Temp 98.4°F | Resp 18 | Ht 66.0 in | Wt 176.4 lb

## 2024-09-16 DIAGNOSIS — D696 Thrombocytopenia, unspecified: Secondary | ICD-10-CM | POA: Diagnosis not present

## 2024-09-16 DIAGNOSIS — Z5112 Encounter for antineoplastic immunotherapy: Secondary | ICD-10-CM | POA: Diagnosis not present

## 2024-09-16 DIAGNOSIS — C8307 Small cell B-cell lymphoma, spleen: Secondary | ICD-10-CM

## 2024-09-16 DIAGNOSIS — D472 Monoclonal gammopathy: Secondary | ICD-10-CM

## 2024-09-16 LAB — CBC WITH DIFFERENTIAL (CANCER CENTER ONLY)
Abs Immature Granulocytes: 0.01 10*3/uL (ref 0.00–0.07)
Basophils Absolute: 0 10*3/uL (ref 0.0–0.1)
Basophils Relative: 0 %
Eosinophils Absolute: 0.2 10*3/uL (ref 0.0–0.5)
Eosinophils Relative: 4 %
HCT: 40.7 % (ref 39.0–52.0)
Hemoglobin: 13.8 g/dL (ref 13.0–17.0)
Immature Granulocytes: 0 %
Lymphocytes Relative: 25 %
Lymphs Abs: 0.9 10*3/uL (ref 0.7–4.0)
MCH: 29.4 pg (ref 26.0–34.0)
MCHC: 33.9 g/dL (ref 30.0–36.0)
MCV: 86.6 fL (ref 80.0–100.0)
Monocytes Absolute: 0.5 10*3/uL (ref 0.1–1.0)
Monocytes Relative: 13 %
Neutro Abs: 2.1 10*3/uL (ref 1.7–7.7)
Neutrophils Relative %: 58 %
Platelet Count: 140 10*3/uL — ABNORMAL LOW (ref 150–400)
RBC: 4.7 MIL/uL (ref 4.22–5.81)
RDW: 17.2 % — ABNORMAL HIGH (ref 11.5–15.5)
WBC Count: 3.6 10*3/uL — ABNORMAL LOW (ref 4.0–10.5)
nRBC: 0 % (ref 0.0–0.2)

## 2024-09-16 LAB — CMP (CANCER CENTER ONLY)
ALT: 15 U/L (ref 0–44)
AST: 21 U/L (ref 15–41)
Albumin: 4.5 g/dL (ref 3.5–5.0)
Alkaline Phosphatase: 73 U/L (ref 38–126)
Anion gap: 9 (ref 5–15)
BUN: 23 mg/dL (ref 8–23)
CO2: 27 mmol/L (ref 22–32)
Calcium: 9.3 mg/dL (ref 8.9–10.3)
Chloride: 104 mmol/L (ref 98–111)
Creatinine: 0.95 mg/dL (ref 0.61–1.24)
GFR, Estimated: >60 mL/min
Glucose, Bld: 103 mg/dL — ABNORMAL HIGH (ref 70–99)
Potassium: 4.3 mmol/L (ref 3.5–5.1)
Sodium: 140 mmol/L (ref 135–145)
Total Bilirubin: 0.7 mg/dL (ref 0.0–1.2)
Total Protein: 7.1 g/dL (ref 6.5–8.1)

## 2024-09-16 LAB — LACTATE DEHYDROGENASE: LDH: 188 U/L (ref 105–235)

## 2024-09-17 ENCOUNTER — Other Ambulatory Visit: Payer: Self-pay

## 2024-09-17 LAB — KAPPA/LAMBDA LIGHT CHAINS
Kappa free light chain: 17.7 mg/L (ref 3.3–19.4)
Kappa, lambda light chain ratio: 3.16 — ABNORMAL HIGH (ref 0.26–1.65)
Lambda free light chains: 5.6 mg/L — ABNORMAL LOW (ref 5.7–26.3)

## 2024-09-17 NOTE — Progress Notes (Signed)
 Remote Loop Recorder Transmission

## 2024-09-18 LAB — MULTIPLE MYELOMA PANEL, SERUM
Albumin SerPl Elph-Mcnc: 4.2 g/dL (ref 2.9–4.4)
Albumin/Glob SerPl: 1.7 (ref 0.7–1.7)
Alpha 1: 0.2 g/dL (ref 0.0–0.4)
Alpha2 Glob SerPl Elph-Mcnc: 0.6 g/dL (ref 0.4–1.0)
B-Globulin SerPl Elph-Mcnc: 0.7 g/dL (ref 0.7–1.3)
Gamma Glob SerPl Elph-Mcnc: 1.1 g/dL (ref 0.4–1.8)
Globulin, Total: 2.5 g/dL (ref 2.2–3.9)
IgA: 29 mg/dL — ABNORMAL LOW (ref 61–437)
IgG (Immunoglobin G), Serum: 596 mg/dL — ABNORMAL LOW (ref 603–1613)
IgM (Immunoglobulin M), Srm: 763 mg/dL — ABNORMAL HIGH (ref 15–143)
M Protein SerPl Elph-Mcnc: 0.5 g/dL — ABNORMAL HIGH
Total Protein ELP: 6.7 g/dL (ref 6.0–8.5)

## 2024-10-12 ENCOUNTER — Encounter (HOSPITAL_COMMUNITY)

## 2024-10-14 ENCOUNTER — Ambulatory Visit

## 2024-10-15 ENCOUNTER — Ambulatory Visit

## 2024-11-14 ENCOUNTER — Ambulatory Visit

## 2024-11-16 ENCOUNTER — Ambulatory Visit

## 2024-12-15 ENCOUNTER — Ambulatory Visit

## 2024-12-17 ENCOUNTER — Inpatient Hospital Stay: Admitting: Hematology and Oncology

## 2024-12-17 ENCOUNTER — Inpatient Hospital Stay: Attending: Hematology and Oncology

## 2024-12-17 ENCOUNTER — Ambulatory Visit
